# Patient Record
Sex: Female | Born: 1955
Health system: Southern US, Community
[De-identification: ages and names within clinical notes are randomized; demographics above are authoritative.]

## PROBLEM LIST (undated history)

## (undated) DIAGNOSIS — I2510 Atherosclerotic heart disease of native coronary artery without angina pectoris: Secondary | ICD-10-CM

## (undated) DIAGNOSIS — K219 Gastro-esophageal reflux disease without esophagitis: Secondary | ICD-10-CM

## (undated) DIAGNOSIS — K922 Gastrointestinal hemorrhage, unspecified: Secondary | ICD-10-CM

## (undated) DIAGNOSIS — K559 Vascular disorder of intestine, unspecified: Secondary | ICD-10-CM

## (undated) DIAGNOSIS — K2970 Gastritis, unspecified, without bleeding: Secondary | ICD-10-CM

## (undated) DIAGNOSIS — F419 Anxiety disorder, unspecified: Secondary | ICD-10-CM

## (undated) DIAGNOSIS — K222 Esophageal obstruction: Secondary | ICD-10-CM

## (undated) DIAGNOSIS — K589 Irritable bowel syndrome without diarrhea: Secondary | ICD-10-CM

## (undated) DIAGNOSIS — I10 Essential (primary) hypertension: Secondary | ICD-10-CM

## (undated) DIAGNOSIS — K635 Polyp of colon: Secondary | ICD-10-CM

## (undated) DIAGNOSIS — R0600 Dyspnea, unspecified: Secondary | ICD-10-CM

## (undated) DIAGNOSIS — Z8719 Personal history of other diseases of the digestive system: Secondary | ICD-10-CM

## (undated) DIAGNOSIS — I219 Acute myocardial infarction, unspecified: Secondary | ICD-10-CM

## (undated) DIAGNOSIS — R0609 Other forms of dyspnea: Secondary | ICD-10-CM

## (undated) DIAGNOSIS — E039 Hypothyroidism, unspecified: Secondary | ICD-10-CM

## (undated) DIAGNOSIS — D128 Benign neoplasm of rectum: Secondary | ICD-10-CM

## (undated) DIAGNOSIS — I208 Other forms of angina pectoris: Secondary | ICD-10-CM

## (undated) DIAGNOSIS — M109 Gout, unspecified: Secondary | ICD-10-CM

## (undated) HISTORY — DX: Esophageal obstruction: K22.2

## (undated) HISTORY — DX: Polyp of colon: K63.5

## (undated) HISTORY — DX: Other forms of dyspnea: R06.09

## (undated) HISTORY — DX: Essential (primary) hypertension: I10

## (undated) HISTORY — DX: Vascular disorder of intestine, unspecified: K55.9

## (undated) HISTORY — PX: HAMMER TOE SURGERY: SHX385

## (undated) HISTORY — DX: Irritable bowel syndrome, unspecified: K58.9

## (undated) HISTORY — PX: TONSILLECTOMY AND ADENOIDECTOMY: SUR1326

## (undated) HISTORY — DX: Dyspnea, unspecified: R06.00

## (undated) HISTORY — DX: Benign neoplasm of rectum: D12.8

## (undated) HISTORY — DX: Gastro-esophageal reflux disease without esophagitis: K21.9

## (undated) HISTORY — DX: Other forms of angina pectoris: I20.8

## (undated) HISTORY — DX: Gastritis, unspecified, without bleeding: K29.70

---

## 1983-08-03 HISTORY — PX: TUBAL LIGATION: SHX77

## 1997-08-02 HISTORY — PX: ABDOMINAL HYSTERECTOMY: SHX81

## 1997-09-12 ENCOUNTER — Ambulatory Visit (HOSPITAL_COMMUNITY): Admission: RE | Admit: 1997-09-12 | Discharge: 1997-09-12 | Payer: Self-pay | Admitting: Obstetrics & Gynecology

## 1997-10-30 ENCOUNTER — Observation Stay (HOSPITAL_COMMUNITY): Admission: RE | Admit: 1997-10-30 | Discharge: 1997-10-31 | Payer: Self-pay | Admitting: Obstetrics & Gynecology

## 1999-03-04 ENCOUNTER — Other Ambulatory Visit: Admission: RE | Admit: 1999-03-04 | Discharge: 1999-03-04 | Payer: Self-pay | Admitting: Obstetrics & Gynecology

## 2001-02-06 ENCOUNTER — Other Ambulatory Visit: Admission: RE | Admit: 2001-02-06 | Discharge: 2001-02-06 | Payer: Self-pay | Admitting: Obstetrics & Gynecology

## 2004-05-13 ENCOUNTER — Other Ambulatory Visit: Admission: RE | Admit: 2004-05-13 | Discharge: 2004-05-13 | Payer: Self-pay | Admitting: Obstetrics & Gynecology

## 2004-07-10 ENCOUNTER — Ambulatory Visit (HOSPITAL_COMMUNITY): Admission: RE | Admit: 2004-07-10 | Discharge: 2004-07-10 | Payer: Self-pay | Admitting: Obstetrics & Gynecology

## 2004-11-19 ENCOUNTER — Ambulatory Visit: Payer: Self-pay | Admitting: Internal Medicine

## 2004-12-30 ENCOUNTER — Ambulatory Visit: Payer: Self-pay | Admitting: Internal Medicine

## 2005-02-25 ENCOUNTER — Ambulatory Visit: Payer: Self-pay | Admitting: Internal Medicine

## 2005-08-31 ENCOUNTER — Ambulatory Visit: Payer: Self-pay | Admitting: Internal Medicine

## 2005-09-01 ENCOUNTER — Encounter (INDEPENDENT_AMBULATORY_CARE_PROVIDER_SITE_OTHER): Payer: Self-pay | Admitting: Specialist

## 2005-09-01 ENCOUNTER — Ambulatory Visit: Payer: Self-pay | Admitting: Internal Medicine

## 2005-09-06 ENCOUNTER — Ambulatory Visit: Payer: Self-pay | Admitting: Internal Medicine

## 2005-09-29 ENCOUNTER — Ambulatory Visit: Payer: Self-pay | Admitting: Internal Medicine

## 2006-08-02 DIAGNOSIS — D128 Benign neoplasm of rectum: Secondary | ICD-10-CM

## 2006-08-02 HISTORY — DX: Benign neoplasm of rectum: D12.8

## 2006-09-21 ENCOUNTER — Ambulatory Visit: Payer: Self-pay | Admitting: Internal Medicine

## 2006-09-22 ENCOUNTER — Ambulatory Visit: Payer: Self-pay | Admitting: Cardiology

## 2006-10-19 ENCOUNTER — Encounter (INDEPENDENT_AMBULATORY_CARE_PROVIDER_SITE_OTHER): Payer: Self-pay | Admitting: *Deleted

## 2006-10-19 ENCOUNTER — Ambulatory Visit: Payer: Self-pay | Admitting: Internal Medicine

## 2007-04-27 ENCOUNTER — Encounter: Admission: RE | Admit: 2007-04-27 | Discharge: 2007-04-27 | Payer: Self-pay | Admitting: Obstetrics & Gynecology

## 2007-11-06 ENCOUNTER — Encounter: Admission: RE | Admit: 2007-11-06 | Discharge: 2007-11-06 | Payer: Self-pay | Admitting: Obstetrics & Gynecology

## 2008-05-31 ENCOUNTER — Encounter: Admission: RE | Admit: 2008-05-31 | Discharge: 2008-05-31 | Payer: Self-pay | Admitting: Obstetrics & Gynecology

## 2008-07-17 ENCOUNTER — Encounter: Admission: RE | Admit: 2008-07-17 | Discharge: 2008-07-17 | Payer: Self-pay | Admitting: Specialist

## 2008-08-02 HISTORY — PX: ANTERIOR LUMBAR FUSION: SHX1170

## 2008-08-15 ENCOUNTER — Ambulatory Visit: Payer: Self-pay | Admitting: *Deleted

## 2008-09-30 HISTORY — PX: BACK SURGERY: SHX140

## 2008-10-02 ENCOUNTER — Encounter (INDEPENDENT_AMBULATORY_CARE_PROVIDER_SITE_OTHER): Payer: Self-pay | Admitting: Specialist

## 2008-10-02 ENCOUNTER — Ambulatory Visit: Payer: Self-pay | Admitting: *Deleted

## 2008-10-02 ENCOUNTER — Inpatient Hospital Stay (HOSPITAL_COMMUNITY): Admission: RE | Admit: 2008-10-02 | Discharge: 2008-10-06 | Payer: Self-pay | Admitting: Specialist

## 2008-10-03 ENCOUNTER — Ambulatory Visit: Payer: Self-pay | Admitting: Surgery

## 2008-10-03 ENCOUNTER — Encounter (INDEPENDENT_AMBULATORY_CARE_PROVIDER_SITE_OTHER): Payer: Self-pay | Admitting: Specialist

## 2008-10-03 HISTORY — PX: OTHER SURGICAL HISTORY: SHX169

## 2009-02-11 ENCOUNTER — Ambulatory Visit: Payer: Self-pay | Admitting: Internal Medicine

## 2009-02-11 DIAGNOSIS — Z86010 Personal history of colonic polyps: Secondary | ICD-10-CM | POA: Insufficient documentation

## 2009-02-11 DIAGNOSIS — K589 Irritable bowel syndrome without diarrhea: Secondary | ICD-10-CM | POA: Insufficient documentation

## 2009-02-11 DIAGNOSIS — K219 Gastro-esophageal reflux disease without esophagitis: Secondary | ICD-10-CM | POA: Insufficient documentation

## 2009-02-11 DIAGNOSIS — R1084 Generalized abdominal pain: Secondary | ICD-10-CM | POA: Insufficient documentation

## 2009-10-23 ENCOUNTER — Encounter: Admission: RE | Admit: 2009-10-23 | Discharge: 2009-10-23 | Payer: Self-pay | Admitting: Obstetrics & Gynecology

## 2009-11-03 ENCOUNTER — Telehealth: Payer: Self-pay | Admitting: Internal Medicine

## 2009-11-04 ENCOUNTER — Ambulatory Visit: Payer: Self-pay | Admitting: Internal Medicine

## 2009-11-04 DIAGNOSIS — R197 Diarrhea, unspecified: Secondary | ICD-10-CM | POA: Insufficient documentation

## 2009-11-04 DIAGNOSIS — K625 Hemorrhage of anus and rectum: Secondary | ICD-10-CM | POA: Insufficient documentation

## 2009-11-04 LAB — CONVERTED CEMR LAB
BUN: 15 mg/dL (ref 6–23)
Basophils Absolute: 0 10*3/uL (ref 0.0–0.1)
CO2: 29 meq/L (ref 19–32)
Creatinine, Ser: 0.9 mg/dL (ref 0.4–1.2)
Eosinophils Absolute: 0.1 10*3/uL (ref 0.0–0.7)
GFR calc non Af Amer: 69.45 mL/min (ref >60)
Glucose, Bld: 93 mg/dL (ref 70–99)
Lymphs Abs: 1.9 10*3/uL (ref 0.7–4.0)
Monocytes Absolute: 0.5 10*3/uL (ref 0.1–1.0)
Monocytes Relative: 9.2 % (ref 3.0–12.0)
Neutro Abs: 3 10*3/uL (ref 1.4–7.7)
Platelets: 278 10*3/uL (ref 150.0–400.0)
Potassium: 4.2 meq/L (ref 3.5–5.1)
RBC: 4.18 M/uL (ref 3.87–5.11)
RDW: 13.4 % (ref 11.5–14.6)
Sodium: 138 meq/L (ref 135–145)

## 2010-07-01 ENCOUNTER — Ambulatory Visit: Payer: Self-pay | Admitting: Internal Medicine

## 2010-07-01 DIAGNOSIS — R198 Other specified symptoms and signs involving the digestive system and abdomen: Secondary | ICD-10-CM | POA: Insufficient documentation

## 2010-07-01 DIAGNOSIS — R1310 Dysphagia, unspecified: Secondary | ICD-10-CM | POA: Insufficient documentation

## 2010-07-13 ENCOUNTER — Telehealth: Payer: Self-pay | Admitting: Internal Medicine

## 2010-08-02 HISTORY — PX: ESOPHAGEAL DILATION: SHX303

## 2010-08-06 ENCOUNTER — Encounter: Payer: Self-pay | Admitting: Internal Medicine

## 2010-08-06 ENCOUNTER — Ambulatory Visit
Admission: RE | Admit: 2010-08-06 | Discharge: 2010-08-06 | Payer: Self-pay | Source: Home / Self Care | Attending: Internal Medicine | Admitting: Internal Medicine

## 2010-09-03 NOTE — Progress Notes (Signed)
Summary: Prep Questions   Phone Note Call from Patient Call back at Home Phone [phone]   Caller: Patient Call For: Dr. Marina Goodell Reason for Call: Talk to Nurse Summary of Call: Pt has questions about her prep Initial call taken by: [place] Johnson,  July 13, 2010 8:49 AM  Follow-up for Phone Call        called patient back and l/m on voicemail to return my call.  Milford Cage [facility]  July 13, 2010 2:07 PM Pt. called and left message on voicemail stating prep was 40.00 and wondered if there was something cheaper.  I advised that Dr. Marina Goodell wants to use movi prep and it usually cost 80.00.  I told her I would mail to her a 20.00 rebate coupon.    Follow-up by: Milford Cage [place],  July 13, 2010 4:46 PM

## 2010-09-03 NOTE — Procedures (Signed)
Summary: Upper Endoscopy  Patient: [name] Note: All result statuses are Final unless otherwise noted.  Tests: (1) Upper Endoscopy (EGD)   EGD Upper Endoscopy       DONE     [facility]     [address]     [place], [place]  [address]           ENDOSCOPY PROCEDURE REPORT           PATIENT:  [name], [name]  MR#:  [id number]     BIRTHDATE:  Feb 20, 1956, 54 yrs. old  GENDER:  female           ENDOSCOPIST:  Wilhemina Bonito. Eda Keys, MD     Referred by:  Office           PROCEDURE DATE:  08/06/2010     PROCEDURE:  EGD, diagnostic 43235,     Maloney Dilation of Esophagus- 88F     ASA CLASS:  Class II     INDICATIONS:  dysphagia, dyspepsia           MEDICATIONS:   There was residual sedation effect present from     prior procedure.     TOPICAL ANESTHETIC:  Exactacain Spray           DESCRIPTION OF PROCEDURE:   After the risks benefits and     alternatives of the procedure were thoroughly explained, informed     consent was obtained.  The LB GIF-H180 [id number] endoscope was     introduced through the mouth and advanced to the second portion of     the duodenum, without limitations.  The instrument was slowly     withdrawn as the mucosa was fully examined.     <<PROCEDUREIMAGES>>           A large caliber ring-like stricture was found in the distal     esophagus.  Otherwise normal esophagus.  The stomach was entered     and closely examined. The antrum, angularis, and lesser curvature     were well visualized, including a retroflexed view of the cardia     and fundus. The stomach wall was normally distensable. The scope     passed easily through the pylorus into the duodenum.  The duodenal     bulb was normal in appearance, as was the postbulbar duodenum.     Retroflexed views revealed no abnormalities.    The scope was then     withdrawn from the patient and the procedure completed.           THERAPY: MALONEY DILATION 88F W/O HEME OR RESISTANCE. TOLERATED  WELL.           COMPLICATIONS:  None           ENDOSCOPIC IMPRESSION:     1) Stricture in the distal esophagus - S/P DILATION 88F     2) Otherwise normal esophagus     3) Normal stomach     4) Normal duodenum     5) GERD           RECOMMENDATIONS:     1) Clear liquids until 4 PM, then soft foods rest of day. Resume     prior diet tomorrow.     2) Continue NEXIUM on demand           ______________________________     Wilhemina Bonito. Eda Keys, MD           CC:  The Patient  n.     eSIGNED:   Wilhemina Bonito. Eda Keys at 08/06/2010 02:12 PM           Nicanor Bake, [id number]  Note: An exclamation mark (!) indicates a result that was not dispersed into the flowsheet. Document Creation Date: 08/06/2010 2:13 PM _______________________________________________________________________  (1) Order result status: Final Collection or observation date-time: 08/06/2010 14:05 Requested date-time:  Receipt date-time:  Reported date-time:  Referring Physician:   Ordering Physician: Fransico Setters [phone]) Specimen Source:  Source: Launa Grill Order Number: [id number] Lab site:

## 2010-09-03 NOTE — Assessment & Plan Note (Signed)
Summary: mucus stools, dysphagia...as.    History of Present Illness Visit Type: Follow-up Visit Primary GI MD: Yancey Flemings MD Primary Provider: na Requesting Provider: na Chief Complaint: Dysphagia with solids and liquids, mucus in stool  and constipation  History of Present Illness:   55 year old female with hypertension, GERD, adenomatous colon polyps, and irritable bowel syndrome. She presents today regarding change in bowel habits as manifested by worsening constipation and the passage of mucus per rectum. Also complains of a globus type sensation and some difficulty with swallowing. She was last seen in the office November 04, 2009 after having had an episode of abdominal pain associated with diaphoresis followed by diarrhea and rectal bleeding. She was felt to have other infectious colitis or ischemic colitis. Laboratories at that time were unremarkable including CBC with hemoglobin of 13.1 and basic metabolic panel. The C-reactive protein was elevated at 9. She was to followup as needed. Current complaints are that of passage of mucus daily for about 10 days 6 weeks ago. This without a bowel movement. She has used MiraLax for constipation but complains of intermittent incontinence when she does move her bowels. She has had no recurrent bleeding. No weight loss. No abdominal pain. She is on Nexium for GERD. She is known to have a peptic stricture on endoscopy performed in January of 2007. No dilation. Complaints now are more consistent with a globus type sensation that is present without eating mostly, as opposed to true esophageal dysphasia. However, as mentioned, she does mention some difficulties with meals. She denies classic reflux symptoms such as heartburn. She tells me that she does not have a recognized primary provider at this point. She had been seeing Dr. Alwyn Ren. Her last colonoscopy was in March 2008. Polyp removed at that time was adenomatous. She does have a history of colon cancer in the  family. Her brother died from the disease.   GI Review of Systems    Reports dysphagia with solids.      Denies abdominal pain, acid reflux, belching, bloating, chest pain, dysphagia with liquids, heartburn, loss of appetite, nausea, vomiting, vomiting blood, weight loss, and  weight gain.      Reports change in bowel habits and  constipation.     Denies anal fissure, black tarry stools, diarrhea, diverticulosis, fecal incontinence, heme positive stool, hemorrhoids, irritable bowel syndrome, jaundice, light color stool, liver problems, rectal bleeding, and  rectal pain.    Current Medications (verified): 1)  Nexium 40 Mg  Cpdr (Esomeprazole Magnesium) .Marland Kitchen.. 1 Capsule By Mouth Once Daily 2)  Lisinopril 40 Mg Tabs (Lisinopril) .... One By Mouth Two Times A Day 3)  Alprazolam 0.5 Mg Tabs (Alprazolam) .... As Needed 4)  Aspirin 325 Mg Tabs (Aspirin) .... Take 1 Tablet By Mouth Once A Day 5)  Bentyl 10 Mg Caps (Dicyclomine Hcl) .... Take 1 Tab Twice Daily As Needed For Cramping and Abdominal Discomfort.  Allergies (verified): No Known Drug Allergies  Past History:  Past Medical History: Esophageal Stricture Duodenitis Gastritis Tubular Adenoma colon polyps 2008 Hypertension GERD IBS  Family Hx of Colon Cancer   Past Surgical History: Reviewed history from 02/11/2009 and no changes required. Hysterectomy Back Surgery 3/ 2010  Family History: Reviewed history from 02/11/2009 and no changes required. Family History of Colon Cancer: Brother-dx'd in 6's, deceased at 30yo Family History of Kidney Disease: Mother, deceased Father deceased--Stroke  Social History: Reviewed history from 02/11/2009 and no changes required. 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  Alcohol Use - no Daily Caffeine Use tea Illicit Drug Use - no  Review of Systems       The patient complains of allergy/sinus and fatigue.  The patient denies anemia,  anxiety-new, arthritis/joint pain, back pain, blood in urine, breast changes/lumps, change in vision, confusion, cough, coughing up blood, depression-new, fainting, fever, headaches-new, hearing problems, heart murmur, heart rhythm changes, itching, menstrual pain, muscle pains/cramps, night sweats, nosebleeds, pregnancy symptoms, shortness of breath, skin rash, sleeping problems, sore throat, swelling of feet/legs, swollen lymph glands, thirst - excessive , urination - excessive , urination changes/pain, urine leakage, vision changes, and voice change.    Vital Signs:  Patient profile:   55 year old female Height:      63 inches Weight:      178 pounds BMI:     31.65 BSA:     1.84 Pulse rate:   76 / minute Pulse rhythm:   regular BP sitting:   148 / 82  (left arm) Cuff size:   regular  Vitals Entered By: Ok Anis CMA (July 01, 2010 8:16 AM)   Physical Exam  General:  Well developed, well nourished, no acute distress. Head:  Normocephalic and atraumatic. Eyes:  PERRLA, no icterus. Mouth:  No deformity or lesions Neck:  Supple; no masses or thyromegaly. Lungs:  Clear throughout to auscultation. Heart:  Regular rate and rhythm; no murmurs, rubs,  or bruits. Abdomen:  Soft, obese,nontender and nondistended. No masses, hepatosplenomegaly or hernias noted. Normal bowel sounds. Rectal:  deferred until colonoscopy Msk:  Symmetrical with no gross deformities. Normal posture. Pulses:  Normal pulses noted. Extremities:  no edema Neurologic:  alert and oriented Skin:  Intact without significant lesions or rashes. Psych:  Alert and cooperative. Normal mood and affect.   Impression & Recommendations:  Problem # 1:  CHANGE IN BOWELS (ICD-787.99) the patient presents today with a change in bowel habits as manifested by worsening constipation. As well, passed up mucus per rectum. Earlier this year with abdominal pain and rectal bleeding. Prior history of adenomatous colon polyps about 4  years ago. Family history of colon cancer in her brother. Suspect constipation predominant irritable bowel. Rule out more ominous diagnoses.  Plan #1. Colonoscopy. The nature of the procedure as well as the risks, benefits, and alternatives were reviewed. She understood and agreed to proceed #2. Movi prep prescribed. The patient instructed on its use  Problem # 2:  FAMILY HX COLON CANCER (ICD-V16.0) brother  Problem # 3:  PERSONAL HX COLONIC POLYPS (ICD-V12.72) adenoma March 2008  Problem # 4:  DYSPHAGIA UNSPECIFIED (ICD-787.20) sounds more like globus sensation. Stricture noted on endoscopy in 2007. need to investigate further with endoscopy and possible esophageal dilation.  plan: #1. Upper endoscopy with possible esophageal dilation. The nature of the procedure as well as the risks, benefits, and alternatives were reviewed. She understood and agreed to proceed  Problem # 5:  GERD (ICD-530.81)  Plan: #1. Continue PPI #2. Reflux precautions with attention to weight loss  Problem # 6:  health maintenance specifically told to secure primary care physician for her overall general medical care  Other Orders: Colon/Endo (Colon/Endo)  Patient Instructions: 1)  Colon/Endo [facility] 08/06/10 1:30 pm arrive at 12:30 pm 2)  Movi prep instructions given. 3)  Movi prep Rx. sent to your pharmacy. 4)  Colonoscopy and Flexible Sigmoidoscopy brochure given.  5)  Upper Endoscopy brochure given.  6)  The medication list was reviewed and reconciled.  All changed /  newly prescribed medications were explained.  A complete medication list was provided to the patient / caregiver. Prescriptions: MOVIPREP 100 GM  SOLR (PEG-KCL-NACL-NASULF-NA ASC-C) As per prep instructions.  #1 x 0   Entered by:   Milford Cage NCMA   Authorized by:   Hilarie Fredrickson MD   Signed by:   Milford Cage NCMA on 07/01/2010   Method used:   Electronically to        [id number] (retail)       [address] [place], [place]  [address]       Ph: [phone]       Fax: [phone]   RxID:   [id number]

## 2010-09-03 NOTE — Assessment & Plan Note (Signed)
Summary: rectal bleeding    History of Present Illness Visit Type: Follow-up Visit Primary GI MD: Yancey Flemings MD Primary Provider: n/a Requesting Provider: self Chief Complaint: rectal bleeding History of Present Illness:   55 YO FEMALE,KNOWN TO DR. PERRY WITH HX OF COLON POLYPS. COLONOSCOPY 3/08 WITH 2 POLYPS/ADENOMATOUS. SHE COMES IN TODAY AFTER ONSET 4 DAYS AGO WITH ABDOMINAL PAIN,CRAMPING AND DIAPHORESIS FOLLOWED BY NAUSEA,DIARRHEA AND EVENTUAL BLOOD IN STOOLS.THIS OCCURRED AFTER EATING CHINESE FOOD. HER FRIEND ATE THE SAME THING-HAD DIARRHEA BUT MUCH LESS SEVERE SXS.HER SXS AT THIS TIME ARE MUCH IMPROVED,STILL SORE IN UPPER ABDOMEN,BUT DIARRHEA HAS STOPPED-NO STOOL YESTERDAY AFTER IMMODIUM,AND NO FURTHER BLEEDING.. NO FEVER,CHILLS, AND HAS BEEN ABLE TO EAT.    GI Review of Systems    Reports abdominal pain and  loss of appetite.     Location of  Abdominal pain: lower abdomen.    Denies acid reflux, belching, bloating, chest pain, dysphagia with liquids, dysphagia with solids, heartburn, nausea, vomiting, vomiting blood, and  weight loss.      Reports change in bowel habits, diarrhea, and  rectal bleeding.     Denies anal fissure, black tarry stools, diverticulosis, fecal incontinence, heme positive stool, hemorrhoids, irritable bowel syndrome, jaundice, light color stool, liver problems, and  rectal pain.    Current Medications (verified): 1)  Nexium 40 Mg  Cpdr (Esomeprazole Magnesium) .Marland Kitchen.. 1 Capsule By Mouth Once Daily 2)  Toprol Xl 100 Mg Xr24h-Tab (Metoprolol Succinate) .Marland Kitchen.. 1 Tablet By Mouth Once Daily 3)  Benicar Hct 40-12.5 Mg Tabs (Olmesartan Medoxomil-Hctz) .... Take 1 Tablet By Mouth Once A Day 4)  Alprazolam 0.5 Mg Tabs (Alprazolam) .... As Needed 5)  Aspirin 325 Mg Tabs (Aspirin) .... Take 1 Tablet By Mouth Once A Day  Allergies (verified): No Known Drug Allergies  Past History:  Past Medical History: Esophageal Stricture Duodenitis Gastritis Tubular Adenoma  colon polyps 2008 Hypertension  Past Surgical History: Reviewed history from 02/11/2009 and no changes required. Hysterectomy Back Surgery 3/ 2010  Family History: Reviewed history from 02/11/2009 and no changes required. Family History of Colon Cancer: Brother-dx'd in 70's, deceased at 27yo Family History of Kidney Disease: Mother, deceased Father deceased--Stroke  Social History: Reviewed history from 02/11/2009 and no changes required. 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  Alcohol Use - no Daily Caffeine Use tea Illicit Drug Use - no  Review of Systems  The patient denies allergy/sinus, anemia, anxiety-new, arthritis/joint pain, back pain, blood in urine, breast changes/lumps, change in vision, confusion, cough, coughing up blood, depression-new, fainting, fatigue, fever, headaches-new, hearing problems, heart murmur, heart rhythm changes, itching, menstrual pain, muscle pains/cramps, night sweats, nosebleeds, pregnancy symptoms, shortness of breath, skin rash, sleeping problems, sore throat, swelling of feet/legs, swollen lymph glands, thirst - excessive , urination - excessive , urination changes/pain, urine leakage, vision changes, and voice change.         ROS OTHERWISE AS IN HPI  Vital Signs:  Patient profile:   55 year old female Height:      63 inches Weight:      189 pounds BMI:     33.60 Pulse rate:   70 / minute Pulse rhythm:   regular BP sitting:   160 / 80  (left arm)  Vitals Entered By: Chales Abrahams CMA Duncan Dull) (November 04, 2009 8:35 AM)  Physical Exam  General:  Well developed, well nourished, no acute distress. Head:  Normocephalic and atraumatic. Eyes:  PERRLA, no icterus. Lungs:  Clear throughout to auscultation. Heart:  Regular rate and rhythm; no murmurs, rubs,  or bruits. Abdomen:  SOFT, BASICALLY NONTENDER, NO MASS OR HSM,BS+ Rectal:  SCANT BLOOD ON GLOVE Extremities:  No clubbing, cyanosis, edema or  deformities noted. Neurologic:  Alert and  oriented x4;  grossly normal neurologically. Psych:  Alert and cooperative. Normal mood and affect.anxious.     Impression & Recommendations:  Problem # 1:  DIARRHEA OF PRESUMED INFECTIOUS ORIGIN (ICD-009.3) Assessment New 55 YO WITH ACUTE ILLNESS X 4-5 DAYS WITH GENERALIZED ABDOMINAL PAIN,DIARRHEA FOLLOWED BY BLOODY DIARRHEA-SXS RESOLVING.  R/O ACUTE INFECTIOUS DIARRHEAL ILLNESS-FOOD BORNE,VS SEGMENTAL ISCHEMIC COLITIS.  DISCUSSED NATURE OF BOTH PROCESSES,REASSURED LABS TODAY,STOOL CULTURES IF ANY FURTHER DIARRHEA BENTYL 10 MG TWICE DAILY AS NEEDED FOR PAIN,CRAMPING OVER THE NEXT FEW DAYS. RESUME NEXIUM 40 MG DAILY X 2 WEEKS THEN MAY GO BACK TO AS NEEDED. Orders: TLB-BMP (Basic Metabolic Panel-BMET) (80048-METABOL) TLB-CRP-High Sensitivity (C-Reactive Protein) (86140-FCRP) TLB-CBC Platelet - w/Differential (85025-CBCD)  Problem # 2:  PERSONAL HX COLONIC POLYPS (ICD-V12.72) Assessment: Comment Only FOLLOW UP DUE 2013  Problem # 3:  HYPERTENSION (ICD-401.9) Assessment: Comment Only  Patient Instructions: 1)  Your physician has requested that you have the following labwork done today: Please go to basement level lab. 2)  Take Nexium 40 mg twice daily for the next 14 days  .  3)  We sent a perscription for you to [facility] for Bentyl. Take as directed. 4)  Follow up as needed with Dr. Yancey Flemings. 5)  The medication list was reviewed and reconciled.  All changed / newly prescribed medications were explained.  A complete medication list was provided to the patient / caregiver. Prescriptions: BENTYL 10 MG CAPS (DICYCLOMINE HCL) Take 1 tab twice daily as needed for cramping and abdominal discomfort.  #60 x 1   Entered by:   Lowry Ram NCMA   Authorized by:   Sammuel Cooper [name]   Signed by:   Lowry Ram NCMA on 11/04/2009   Method used:   Electronically to        [facility] (retail)       [address] [place], [place]  [address]       Ph: [phone]       Fax: [phone]   RxID:   [id number]

## 2010-09-03 NOTE — Letter (Signed)
Summary: [facility] Instructions  [facility]  [address] [place], [place] [address]   Phone: [phone]  Fax: [phone]       [name]    [birth year]    MRN: [id number]        Procedure Day /Date:THURSDAY, 08/06/10     Arrival Time:12:30 PM     Procedure Time:1:30 PM     Location of Procedure:                    X  [facility] (4th Floor)       PREPARATION FOR COLONOSCOPY WITH MOVIPREP/ENDO   Starting 5 days prior to your procedure 08/01/10 do not eat nuts, seeds, popcorn, corn, beans, peas,  salads, or any raw vegetables.  Do not take any fiber supplements (e.g. Metamucil, Citrucel, and Benefiber).  THE DAY BEFORE YOUR PROCEDURE         DATE:08/05/10 DAY: WEDNESDAY  1.  Drink clear liquids the entire day-NO SOLID FOOD  2.  Do not drink anything colored red or purple.  Avoid juices with pulp.  No orange juice.  3.  Drink at least 64 oz. (8 glasses) of fluid/clear liquids during the day to prevent dehydration and help the prep work efficiently.  CLEAR LIQUIDS INCLUDE: Water Jello Ice Popsicles Tea (sugar ok, no milk/cream) Powdered fruit flavored drinks Coffee (sugar ok, no milk/cream) Gatorade Juice: apple, white grape, white cranberry  Lemonade Clear bullion, consomm, broth Carbonated beverages (any kind) Strained chicken noodle soup Hard Candy                             4.  In the morning, mix first dose of MoviPrep solution:    Empty 1 Pouch A and 1 Pouch B into the disposable container    Add lukewarm drinking water to the top line of the container. Mix to dissolve    Refrigerate (mixed solution should be used within 24 hrs)  5.  Begin drinking the prep at 5:00 p.m. The MoviPrep container is divided by 4 marks.   Every 15 minutes drink the solution down to the next mark (approximately 8 oz) until the full liter is complete.   6.  Follow completed prep with 16 oz of clear liquid of your choice (Nothing red or  purple).  Continue to drink clear liquids until bedtime.  7.  Before going to bed, mix second dose of MoviPrep solution:    Empty 1 Pouch A and 1 Pouch B into the disposable container    Add lukewarm drinking water to the top line of the container. Mix to dissolve    Refrigerate  THE DAY OF YOUR PROCEDURE      DATE:08/06/10 SWF:UXNATFTD  Beginning at 8:30 a.m. (5 hours before procedure):         1. Every 15 minutes, drink the solution down to the next mark (approx 8 oz) until the full liter is complete.  2. Follow completed prep with 16 oz. of clear liquid of your choice.    3. You may drink clear liquids until 11:30 AM (2 HOURS BEFORE PROCEDURE).   MEDICATION INSTRUCTIONS  Unless otherwise instructed, you should take regular prescription medications with a small sip of water   as early as possible the morning of your procedure.         OTHER INSTRUCTIONS  You will need a responsible adult at least 55 years of age  to accompany you and drive you home.   This person must remain in the waiting room during your procedure.  Wear loose fitting clothing that is easily removed.  Leave jewelry and other valuables at home.  However, you may wish to bring a book to read or  an iPod/MP3 player to listen to music as you wait for your procedure to start.  Remove all body piercing jewelry and leave at home.  Total time from sign-in until discharge is approximately 2-3 hours.  You should go home directly after your procedure and rest.  You can resume normal activities the  day after your procedure.  The day of your procedure you should not:   Drive   Make legal decisions   Operate machinery   Drink alcohol   Return to work  You will receive specific instructions about eating, activities and medications before you leave.    The above instructions have been reviewed and explained to me by   _______________________    I fully understand and can verbalize these  instructions _____________________________ Date _________

## 2010-09-03 NOTE — Progress Notes (Signed)
Summary: triage   Phone Note Call from Patient Call back at Home Phone [phone]   Caller: Patient Call For: Marina Goodell Reason for Call: Talk to Nurse Summary of Call: Patient wants to be seen sooner than first available appt do to a lot of blood in her stools and a little pain. Initial call taken by: Tawni Levy,  November 03, 2009 8:06 AM  Follow-up for Phone Call        Friday and Sat. pt. had loose stools with BRB and mucus along with cramping and nausea. Says on Friday she felt weak and almost most passed out. No bm yesterday or today yet is still having some nausea.Appt. scheduled for tomorrow as she prefers to wait as Dr.Kasey Ewings is supervising dr.Says she has IBS but is afraid to use anything for constipation because then she can't get the diarrhea under control. Follow-up by: Teryl Lucy RN,  November 03, 2009 8:24 AM

## 2010-09-03 NOTE — Procedures (Addendum)
Summary: Colonoscopy  Patient: [name] Note: All result statuses are Final unless otherwise noted.  Tests: (1) Colonoscopy (COL)   COL Colonoscopy           DONE     [facility]     [address]     [place], [place]  [address]           COLONOSCOPY PROCEDURE REPORT           PATIENT:  [name], [name]  MR#:  [id number]     BIRTHDATE:  08/29/1955, 54 yrs. old  GENDER:  female     ENDOSCOPIST:  Wilhemina Bonito. Eda Keys, MD     REF. BY:  Office     PROCEDURE DATE:  08/06/2010     PROCEDURE:  Colonoscopy with snare polypectomy x 2     ASA CLASS:  Class II     INDICATIONS:  history of pre-cancerous (adenomatous) colon polyps,     family history of colon cancer, surveillance and high-risk     screening ; 10-2006 w/ TA; brother w/ CRC (Also c/o mucous,     constipation, and abdominal discomfort)     MEDICATIONS:   Fentanyl 150 mcg IV, Versed 15 mg IV, Benadryl 50     mg IV           DESCRIPTION OF PROCEDURE:   After the risks benefits and     alternatives of the procedure were thoroughly explained, informed     consent was obtained.  Digital rectal exam was performed and     revealed no abnormalities.   The LB 180AL [id number] endoscope was     introduced through the anus and advanced to the cecum, which was     identified by both the appendix and ileocecal valve, without     limitations.Time to cecum = 2:22 min. The quality of the prep was     excellent, using MoviPrep.  The instrument was then slowly     withdrawn (time = 12:15 min) as the colon was fully examined.     <<PROCEDUREIMAGES>>           FINDINGS:  A 1mm polyp was found in the cecum. Polyp was snared     without cautery.  A 1mm polyp was found in the ascending colon.     Polyp was snared without cautery. No meaningful tissue available     for submission.  Otherwise normal colonoscopy without other     polyps, masses, vascular ectasias, or inflammatory changes.     Retroflexed views in the rectum revealed no  abnormalities.    The     scope was then withdrawn from the patient and the procedure     completed.           COMPLICATIONS:  None     ENDOSCOPIC IMPRESSION:     1) Diminutive polyp in the cecum - removed     2) Diminutive polyp in the ascending colon - removed     3) Otherwise normal  colonoscopy           RECOMMENDATIONS:     1) Follow up colonoscopy in 5 years           ______________________________     Wilhemina Bonito. Eda Keys, MD           CC:  The Patient           n.     eSIGNED:   Wilhemina Bonito. Marina Goodell  Jr at 08/06/2010 02:01 PM           Nicanor Bake, [id number]  Note: An exclamation mark (!) indicates a result that was not dispersed into the flowsheet. Document Creation Date: 08/06/2010 2:03 PM _______________________________________________________________________  (1) Order result status: Final Collection or observation date-time: 08/06/2010 13:54 Requested date-time:  Receipt date-time:  Reported date-time:  Referring Physician:   Ordering Physician: Fransico Setters [phone]) Specimen Source:  Source: Launa Grill Order Number: [id number] Lab site:

## 2010-10-28 ENCOUNTER — Other Ambulatory Visit: Payer: Self-pay | Admitting: Dermatology

## 2010-11-12 LAB — CBC
Hemoglobin: 8.8 g/dL — ABNORMAL LOW (ref 12.0–15.0)
MCHC: 35.7 g/dL (ref 30.0–36.0)
MCV: 89.9 fL (ref 78.0–100.0)
MCV: 90.8 fL (ref 78.0–100.0)
MCV: 91 fL (ref 78.0–100.0)
Platelets: 180 10*3/uL (ref 150–400)
Platelets: 184 10*3/uL (ref 150–400)
RBC: 2.72 MIL/uL — ABNORMAL LOW (ref 3.87–5.11)
RDW: 13.1 % (ref 11.5–15.5)
WBC: 12.7 10*3/uL — ABNORMAL HIGH (ref 4.0–10.5)
WBC: 13.4 10*3/uL — ABNORMAL HIGH (ref 4.0–10.5)

## 2010-11-12 LAB — BASIC METABOLIC PANEL
BUN: 12 mg/dL (ref 6–23)
CO2: 26 mEq/L (ref 19–32)
Calcium: 8.2 mg/dL — ABNORMAL LOW (ref 8.4–10.5)
Chloride: 105 mEq/L (ref 96–112)
Chloride: 105 mEq/L (ref 96–112)
Creatinine, Ser: 0.78 mg/dL (ref 0.4–1.2)
Creatinine, Ser: 0.85 mg/dL (ref 0.4–1.2)
GFR calc Af Amer: >60 mL/min (ref >60)
Glucose, Bld: 154 mg/dL — ABNORMAL HIGH (ref 70–99)
Potassium: 3.7 mEq/L (ref 3.5–5.1)
Sodium: 140 mEq/L (ref 135–145)

## 2010-11-12 LAB — HEMOGLOBIN A1C: Mean Plasma Glucose: 114 mg/dL

## 2010-11-12 LAB — HEMOGLOBIN AND HEMATOCRIT, BLOOD: Hemoglobin: 8.9 g/dL — ABNORMAL LOW (ref 12.0–15.0)

## 2010-11-12 LAB — GLUCOSE, CAPILLARY

## 2010-11-17 LAB — COMPREHENSIVE METABOLIC PANEL
AST: 22 U/L (ref 0–37)
Albumin: 3.8 g/dL (ref 3.5–5.2)
Alkaline Phosphatase: 132 U/L — ABNORMAL HIGH (ref 39–117)
Chloride: 100 mEq/L (ref 96–112)
GFR calc Af Amer: >60 mL/min (ref >60)
Potassium: 3.7 mEq/L (ref 3.5–5.1)
Total Bilirubin: 0.3 mg/dL (ref 0.3–1.2)

## 2010-11-17 LAB — DIFFERENTIAL
Basophils Absolute: 0.1 10*3/uL (ref 0.0–0.1)
Basophils Relative: 1 % (ref 0–1)
Eosinophils Relative: 2 % (ref 0–5)
Monocytes Absolute: 0.6 10*3/uL (ref 0.1–1.0)

## 2010-11-17 LAB — CBC
HCT: 38.4 % (ref 36.0–46.0)
Platelets: 276 10*3/uL (ref 150–400)
WBC: 7.3 10*3/uL (ref 4.0–10.5)

## 2010-11-17 LAB — URINALYSIS, ROUTINE W REFLEX MICROSCOPIC
Bilirubin Urine: NEGATIVE
Glucose, UA: NEGATIVE mg/dL
Ketones, ur: NEGATIVE mg/dL
Leukocytes, UA: NEGATIVE
pH: 6 (ref 5.0–8.0)

## 2010-11-17 LAB — TYPE AND SCREEN
ABO/RH(D): O POS
Antibody Screen: NEGATIVE

## 2010-11-17 LAB — ABO/RH: ABO/RH(D): O POS

## 2010-12-15 NOTE — Op Note (Signed)
NAMEMARQUITA, [name] NO.:  [id number]   MEDICAL RECORD NO.:  [id number]          PATIENT TYPE:  INP   LOCATION:  5035                         FACILITY:  [facility]   PHYSICIAN:  Jene Every, M.D.    DATE OF BIRTH:  1955/10/28   DATE OF PROCEDURE:  10/02/2008  DATE OF DISCHARGE:                               OPERATIVE REPORT   PREOPERATIVE DIAGNOSES:  1. Degenerative disk disease.  2. Degenerative spondylolisthesis at L5-S1.   POSTOPERATIVE DIAGNOSES:  1. Degenerative disk disease.  2. Degenerative spondylolisthesis at L5-S1.   PROCEDURE PERFORMED:  1. Interlumbar interbody fusion utilizing SynFix interbody spacer and      plate and screw fixation.  2. Autologous bone marrow aspirate and allograft Actifuse bone graft.   ANESTHESIA:  General.   ASSISTANT:  1. Dr. Shon Baton.  2. Dr. Madilyn Fireman.   BRIEF HISTORY:  This is a 55 year old with severe degenerative  spondylolisthesis, grade 1 degenerative disk disease and facet  arthrosis, concordant diskography, discordant at L4-L5 facet arthrosis.  She had a fairly degenerated disk space with slight listhesis, been  refractory to conservative treatment, and  is indicated for fusion of  the degenerated segment.  Risks and benefits discussed bleeding,  infection, damage to vascular structure, inability to fuse, need for  revision, posterior approach, etc.   __________ the patient was placed in supine position, Andrews frame,  slight bump in the lower part of the lumbar region.  The abdomen was  prepped and draped in the usual sterile fashion.  After the surgical  approach by Dr. Madilyn Fireman which was a left midline abdominal incision after  localization under C-arm x-ray for L5-S1 interspace approach.  Dr. Madilyn Fireman  provided the approach at L5-S1 with appropriate vascular and soft tissue  retraction for this.  Spaces were identified on her x-ray on the AP and  lateral plain and centralized.  It was noted initially was a wider  space  at L5-S1 than seen on the preoperative radiographs and radiographs noted  in the office.  We did, therefore, confirm the space with multiple re-  evaluations.  Following this, in the L5 vertebral body, we used a  Jamshidi needle, impacted into the vertebral body 1 cm, and aspirated  approximately 6 mL of the bone marrow aspirate.  This was mixed with  Actifuse for the appropriate bone grafting.  I then placed bone wax in  this donor's site which was after irrigation.  Next, a 10-blade was  utilized to incise the anulus in the medially, laterally, cephalad, and  caudad.  We then used a Cobb to remove the cartilage from the endplates  at L5 and S1 and used a pituitary to perform a full diskectomy.  The  endplate was preserved, and we curetted the cartilage to get bleeding  tissue.  We then proceeded posteriorly and released the annulus from the  annular attachment 2.5 utilizing a curved curette.  Then, further  augmented that with 2-mm Kerrison over the posterior aspect of S1.  This  released posterior annulus.  Next, we then tried multiple trials, which  seemed to at least initially not contour  on the L5 due to the concavity  of the vertebral body at L5 which was somewhat unusual.  We tried  multiple sizes including the 13, 5, 15, as well as the 8 and 12-degree  lordotic cages.  We then entertained utilizing a stand-alone PEEK cage  with a screw anteriorly, given the unusual anatomy.  I felt that the  SynFix cage would be lowered laterally but not centrally.  We were  discussing options to increase the compressive surface that would be  loaded under compression.  We removed the bone from beneath the lower  lumbar spine which closed down the disk space and improved the  conformity.  At the end of this, decision making process elected to  proceed with the SynFix 15 12-degree lordotic cage.  This was then  packed with autologous and allograft bone graft inserted with the squid   inserted in the AP and lateral C-arm plain with appropriate insertion  with some space between the back of the device in the posterior aspect  of the vertebral body.  We then impacted bone graft above the cage  filling in the area between the top part of the cage and L5 that we  slightly gapped open.  The device fit in quite well.  It was fairly  snug, it was not loose.  We felt that this was the appropriate  confirming size.  This was then fixed with 4 screws cephalad and caudad  with the appropriate guide with utilization of the awl, insertion of 20-  mm screws with excellent purchase.  This was seen on the AP and lateral  plain to have closed down the gap, we were initially concerned about and  an appropriate spacing as well in the AP plain.  The wounds were then  copiously irrigated.  The AP and lateral plain without retractor showed  excellent placement of the device.  Next, inspection from the ulnar  retractors revealed no evidence of bleeding.  Full inspection of the  tissues was performed.  Next, following this, we closed the abdominal  fascia with 2-0 running #1 Vicryl from the outer edges converging  medially and then overlapping at the end, lifting up on the fascia, and  protecting it from the soft tissue beneath it.  Next, the wound was  copiously irrigated, and I closed the subcutaneous tissue which showed  fair amount of adipose tissue with 0 and 2-0 Vicryl subcuticular.  Skin  was reapproximated with 4-0 subcuticular Prolene, the wound reinforced  with Steri-Strips, sterile dressing applied applied, placed supine on  the hospital bed, and extubated without difficulty and transported to  recovery in satisfactory condition.   The patient tolerated the procedure well with no complications.  Dr.  Shon Baton and Dr. Madilyn Fireman performed the approach.  She had a pulse oximetry  on left lower extremity throughout the case and had continued with  normal pulses and oximetry throughout.    The patient tolerated the procedure well with no complications.  Blood  loss 350 mL was given.  We had a unit returned with Cell Saver.      Jene Every, M.D.  Electronically Signed     JB/MEDQ  D:  10/02/2008  T:  10/03/2008  Job:  [id number]

## 2010-12-15 NOTE — Op Note (Signed)
NAMEHANLEY, RISPOLI NO.:  [id number]   MEDICAL RECORD NO.:  [id number]          PATIENT TYPE:  INP   LOCATION:  5035                         FACILITY:  [facility]   PHYSICIAN:  Balinda Quails, M.D.    DATE OF BIRTH:  26-Jul-1956   DATE OF PROCEDURE:  10/02/2008  DATE OF DISCHARGE:                               OPERATIVE REPORT   SURGEON:  Balinda Quails, MD   CO-SURGEON:  Javier Docker, MD   ASSISTANT:  Alvy Beal, MD   ANESTHETIC:  General endotracheal.   PREOPERATIVE DIAGNOSIS:  L5-S1 degenerative disk disease.   POSTOPERATIVE DIAGNOSIS:  L5-S1 degenerative disk disease.   PROCEDURE:  L5-S1 anterior lumbar interbody fusion (ALIF).   CLINICAL NOTE:  [name] is a 55 year old female who was seen  in consultation preoperatively in the office for planned one-level L5-S1  ALIF.  The patient was seen and felt to be a good candidate for surgery.  Risks of surgery were explained preoperatively including the details of  the operative procedure.   OPERATIVE PROCEDURE:  The patient was brought to the operating room in  stable condition.  Foley catheter, arterial line, and Swan-Ganz catheter  in place.  General endotracheal anesthesia was induced in the supine  position.  The abdomen was prepped and draped in a sterile fashion.   A transverse skin incision was made in the left lower quadrant at the  projection of L5-S1.  Subcutaneous tissue was divided with  electrocautery.  Left anterior rectus sheath was incised transversely.  The anterior rectus sheath flaps were raised.  The left rectus muscle  was mobilized and retracted medially.  The left retroperitoneal plane  was entered and the peritoneal contents were rotated anteriorly.  The  ureter was rotated with the peritoneal contents.  The left common and  external iliac artery were identified.  The L5-S1 disk could easily be  felt.  Blunt dissection was carried directly down onto the L5-S1 disk.  Middle sacral vessels were controlled with clips and divided.  The L5-S1  disk was cleared from left-to-right with blunt dissection.  The veins  were retracted laterally.  A small rent was created in the left common  iliac vein.  This was easily repaired with interrupted 5-0 Prolene  without compromise of the lumen.   Full retraction was then completed with reverse lip Brau blades using  the [facility] retractor system.  The full exposure of the L5-S1 disk was  verified by fluoroscopy.   Dr. Shelle Iron with the assistance of Dr. Shon Baton then performed L5-S1 ALIF.  Closure dictated under separate heading by Dr. Shelle Iron.   In the PACU, the patient had 2+ dorsalis pedis pulses bilaterally  without apparent complication.      Balinda Quails, M.D.  Electronically Signed     PGH/MEDQ  D:  10/02/2008  T:  10/03/2008  Job:  [id number]

## 2010-12-15 NOTE — H&P (Signed)
[name], [name] NO.:  [id number]   MEDICAL RECORD NO.:  [id number]          PATIENT TYPE:  INP   LOCATION:                               FACILITY:  [facility]   PHYSICIAN:  [name], M.D.    DATE OF BIRTH:  08-Oct-1955   DATE OF ADMISSION:  10/02/2008  DATE OF DISCHARGE:                              HISTORY & PHYSICAL   ADMISSION DIAGNOSIS:  Degenerative joint disease at L5-S1.   HISTORY:  [name] is a 55 year old female with a history of low  back pain that has progressively gotten worse.  She describes it as  disabling and severe, especially with standing or walking for extended  periods of time.  She has undergone conservative treatment with only  short-term relief of her symptoms.  This includes facet injections as  well as physical therapy.  Again, she notes her pain pattern as severe  and disabling.  Diskogram studies were obtained, which do show  concordant pain at L5-S1 with severe disk degeneration.  It is felt that  she would benefit from an anterior lumbar fusion.  The risks and  benefits of this were discussed with the patient.  She does elect to  proceed.   Medical history is significant for:  1. Uncontrolled hypertension.  2. Gastroesophageal reflux disease.  3. Anxiety.  4. Hypercholesteremia.   CURRENT MEDICATIONS:  1. Toprol-XL 100 mg 1 p.o. daily.  2. Hydrochlorothiazide 25 mg 1 p.o. daily.  3. Nexium 40 mg p.o. p.r.n.  4. Xanax 0.5 mg 1 p.o. daily.  5. Premarin 0.45 mg 1 p.o. daily.  6. New medication started as of September 27, 2008, include Azor 10/40      mg 1 p.o. q.a.m.  7. Crestor 10 mg 1 p.o. daily.   ALLERGIES:  None.   PREVIOUS SURGERIES:  1. Hysterectomy.  2. Bilateral foot surgery.   SOCIAL HISTORY:  The patient is divorced.  She lives alone.  History is  negative for tobacco or alcohol.  She has 2 children.   CARDIOLOGIST:  Sabrina Batty, MD   REVIEW OF SYSTEMS:  GENERAL:  The patient denies any fever,  chills,  night sweats, or bleeding tendencies.  CNS:  No blurred, double vision,  seizure, headache, or paralysis.  RESPIRATORY:  No shortness of breath,  productive cough, or hemoptysis.  CARDIOVASCULAR:  No chest pain,  dyspnea, or orthopnea.  GENITOURINARY:  No dysuria, hematuria, or  discharge.  GASTROINTESTINAL:  No nausea, vomiting, diarrhea,  constipation, melena, or bloody stools.  MUSCULOSKELETAL:  Per HPI.   PHYSICAL EXAMINATION:  VITAL SIGNS:  Pulse is 80, respiratory 12, BP  170/110.  The patient confirms this is her on normal blood pressure.  She has had a issues in the past with control.  She has not seen Dr.  Allyson Day since 2008.  GENERAL:  This is an overweight female, sitting upright, in moderate  distress.  She does have pain with forward flexion/extension of lumbar  spine.  She does walk with slight antalgic gait.  HEENT:  Atraumatic, normocephalic.  Pupils equal, round, and reactive to  light.  EOMs intact.  NECK:  Supple and no lymphadenopathy.  CHEST:  Clear to auscultation bilaterally.  No rhonchi, wheezes, or  rales.  HEART:  Regular rate and rhythm without murmurs, gallops, or rubs.  ABDOMEN:  Protuberant, soft, nontender, nondistended.  Bowel sounds x4.  SKIN:  No rashes or lesions are noted.  BACK:  She has a pain with forward flexion and extension.  EXTREMITIES:  She has negative straight-leg raise bilaterally.  EHL is  equal.   IMPRESSION:  Disk degeneration at L5-1.   IMPRESSION:  The patient will be admitted to undergo anterior lumbar  fusion at 5-1.  We did obtain a cardiac clearance from [name].  The  patient was evaluated on September 27, 2008.  On September 30, 2008, she had  an echo, as well as a stress test that were reviewed, and they placed  the patient at a low cardiovascular risk.  We will proceed with the  stairlift as anticipated.  If there are any problems in the hospital, we  will call [facility] to evaluate her.       [name], P.A.      [name], M.D.  Electronically Signed    CS/MEDQ  D:  10/01/2008  T:  10/02/2008  Job:  [id number]

## 2010-12-15 NOTE — Consult Note (Signed)
VASCULAR SURGERY CONSULTATION   Day, Sabrina A  DOB:  Feb 08, 1956                                       08/15/2008  ZOXWR#:[id number]   REFERRAL DIAGNOSIS:  L5-S1 degenerative disk disease.   HISTORY:  The patient is a 55 year old female with a history of chronic  back pain.  She has severe pain in the lower back when she attempts to  stand in flexion as well as in extension.  She has undergone extensive  workup by Dr. Shelle Iron including discogram.  She had concordance at L5-S1,  severe degenerative disk disease with concordant pain.  She was referred  at this time for a preoperative evaluation prior to L5-S1 ALIF.   History of abdominal surgery with hysterectomy, apparently vaginal  hysterectomy with laparoscopic assistance.  No history of DVT or  pulmonary embolus.  Denies known peripheral vascular disease.   PAST MEDICAL HISTORY:  Hypertension.   SOCIAL HISTORY:  The patient is single.  She has 2 grown children.  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  Does not  smoke or use tobacco products.   FAMILY HISTORY:  Mother deceased age 74 of kidney failure.  Father  deceased age 109 of a stroke.  She has a brother who is deceased of an MI  and another brother killed in an accident.   MEDICATIONS:  1. Toprol 100 mg daily.  2. Hydrochlorothiazide 25 mg daily.  3. Xanax 0.5 mg daily.  4. Clonidine q.h.s.  5. Premarin 0.45 daily.  6. Nexium p.r.n.   ALLERGIES:  None known.   REVIEW OF SYSTEMS:  The patient denies any recent symptoms of chest pain  or shortness of breath.  No cough or sputum production.  Regular bowel  habits.  No abdominal pain.  No recent weight loss or anorexia.  Denies  dysuria, frequency.  No leg swelling.   PHYSICAL EXAMINATION:  General:  A moderately obese 55 year old female.  Alert and oriented.  No distress.  Vital signs:  BP 173/106, pulse 76  per minute.  HEENT:  Unremarkable.  Neck:  Supple.  No  thyromegaly or  adenopathy.  Cardiovascular:  No carotid bruits.  Normal heart sounds  without murmurs.  Irregular rate and rhythm.  No gallops or rubs.  Chest:  Equal entry bilaterally.  No rales or rhonchi.  Abdomen:  Moderate obesity.  No masses or organomegaly.  Nontender.  No abdominal  bruits.  Lower extremities:  2+ femoral, popliteal, posterior tibial and  dorsalis pedis pulses bilaterally.  No ankle edema.   IMPRESSION:  1. Chronic back pain associated with L5-S1 degenerative disk disease.  2. Hypertension.   The patient does have moderate obesity and history of previous  laparoscopic assisted vaginal hysterectomy.  These do not preclude  potential anterior lumbar interbody fusion with left retroperitoneal  approach.  Surgery can be scheduled as appropriate.  Potential risks of  the operative procedure including vascular injury, limb ischemia,  bleeding, DVT, pulmonary embolus, ureter injury, bowel injury, infection  or hernia have been reviewed.   Balinda Quails, M.D.  Electronically Signed  PGH/MEDQ  D:  08/15/2008  T:  08/16/2008  Job:  [id number]   cc:   Jene Every, M.D.

## 2010-12-18 NOTE — Op Note (Signed)
[name], [name] NO.:  [id number]   MEDICAL RECORD NO.:  [id number]          PATIENT TYPE:  AMB   LOCATION:  SDC                           FACILITY:  [facility]   PHYSICIAN:  Freddy Finner, M.D.   DATE OF BIRTH:  [birth year]   DATE OF PROCEDURE:  07/10/2004  DATE OF DISCHARGE:                                 OPERATIVE REPORT   PREOPERATIVE DIAGNOSIS:  Pelvic pain.   POSTOPERATIVE DIAGNOSES:  1.  Extensive pelvic adhesions.  2.  Previous history of laparoscopic-assisted vaginal hysterectomy,      bilateral salpingo-oophorectomy for adenomyosis and pelvic      endometriosis.   OPERATIVE PROCEDURE:  Laparoscopy, lysis of extensive pelvic adhesions.   ANESTHESIA:  General.   ESTIMATED INTRAOPERATIVE BLOOD LOSS:  Less than 10 mL.   INTRAOPERATIVE COMPLICATIONS:  None.   The patient is a 55 year old who previously had a complete hysterectomy and  in the recent past has started having progressively increasing pelvic pain.  She is admitted now for laparoscopy.   The findings were no evidence of active, visible endometriosis, but  extensive pelvic adhesions.  Those findings were recorded in still  photographs retained in the office record.  Her appendix was visualized and  was normal.  There was no apparent abnormality in the upper abdomen.  The  adhesions involved the sigmoid colon and the ascending colon.   The patient was admitted on the afternoon of surgery.  She was brought to  the operating room.  She was there placed under adequate general anesthesia,  placed in the dorsal lithotomy position using the [employer] system.  Betadine prep of abdomen and perineum was carried out in the usual fashion.  The bladder was evacuated using a Robinson catheter and sterile technique.  Sponge forceps with a sponge at the tip was placed into the vagina for use  in identifying the vagina during the procedure.  Sterile drapes were  applied.  Two small incisions were made  in the abdomen, both through old  scars - one at the umbilicus and one just above the symphysis.  An 11 mm  nonbladed disposable trocar was introduced at the umbilicus while elevating  the anterior abdominal wall manually.  Direct inspection revealed adequate  placement with no evidence of injury on entry.  Pneumoperitoneum was allowed  to accumulate with carbon dioxide gas.  A 5 mm trocar was placed through the  lower incision under direct visualization.  A brunt probe and later a spring-  loaded grasping forceps was used through this trocar sleeve.  Systematic  examination of the pelvic and abdominal contents was carried out.  Numerous  adhesions of bowel to peritoneal surfaces, particularly along the left side  and at the midpoint of the vaginal cuff, were identified.  Using the  reticulated EndoShears and unipolar cautery, these adhesions were virtually  completely released.  Careful coagulation accomplished complete hemostasis.  This was confirmed by inspection under reduced intraabdominal pressure.  After completing the procedure, all instruments were removed, gas was  allowed to escape from the abdomen.  Plain Marcaine 0.25% was injected into  the incision sites.  Toradol 30 mg IV and 30 IM were given for postoperative  analgesia.  The incision sites were closed with interrupted subcuticular  sutures of 3-0 Dexon.  Steri-  Strips were applied through the lower incision. The patient was awakened,  taken to recovery in good condition.  She will be discharged in the  immediate postoperative period for follow-up in the office in approximately  2 weeks.  She was given routine outpatient surgical instructions.  She has  Vicodin which she is to use for postoperative pain.     Hosie Spangle   WRN/MEDQ  D:  07/10/2004  T:  07/10/2004  Job:  [id number]

## 2010-12-18 NOTE — Discharge Summary (Signed)
NAMECORRIE, REDER NO.:  [id number]   MEDICAL RECORD NO.:  [id number]          PATIENT TYPE:  INP   LOCATION:  5035                         FACILITY:  [facility]   PHYSICIAN:  Jene Every, M.D.    DATE OF BIRTH:  09/15/1955   DATE OF ADMISSION:  10/02/2008  DATE OF DISCHARGE:  10/06/2008                               DISCHARGE SUMMARY   ADMISSION DIAGNOSES:  1. Disk degeneration at L5-S1.  2. Uncontrolled hypertension.  3. Gastroesophageal reflux disease.  4. Anxiety.  5. Hypercholesteremia.   DISCHARGE DIAGNOSES:  1. Status post anterior lumbar fusion at L5-S1.  2. Disk degeneration at L5-S1.  3. Uncontrolled hypertension.  4. Gastroesophageal reflux disease.  5. Anxiety.  6. Hypercholesteremia.   HISTORY:  Ms. Lamia is a pleasant 55 year old female with a  longstanding history of progressive back pain.  She describes it as  severe and disabling.  She has undergone conservative treatment  including ejection as well as physical therapy with little-to-no relief  of her symptoms.  The patient did undergo a diskogram which showed  concordant pain at L5-S1.  At this point, it is felt she will benefit  from anterior fusion.  The risks and benefits of this were discussed  with the patient.  Medical clearance was obtained.  She does elect to  proceed.   PROCEDURE:  The patient was taken to the OR on October 02, 2008, and  underwent anterior lumbar fusion at L5-S1.  Dr. Liliane Bade did the  approach for the anterior fusion   SURGEON:  Jene Every, MD   ASSISTANT:  1. [name], [name]  2. Alvy Beal, MD   ANESTHESIA:  General.   COMPLICATIONS:  None.   CONSULTANT:  PT, OT, Care Management.   LABORATORY DATA:  Preoperative CBC showed white cell count 7.3,  hemoglobin 13.5, and hematocrit 38.4.  This was monitored throughout the  hospital course.  The patient's white cell count did go up to 13.4,  hemoglobin dropped to 8.8, and hematocrit  24.8.  However, the patient  remained stable.  Coagulation studies done preoperatively showed PT  12.4, INR 0.9, and PTT of 30.  Routine chemistries done preoperatively  showed normal sodium and potassium with a normal renal function.  This  was monitored throughout the hospital course.  Sodium was stable at  discharge 140, potassium at 3.7, BUN and creatinine remained stable.  GFR was greater than 60.  Routine liver function tests showed slightly  elevated ALT at 132, otherwise normal.  Hemoglobin A1c was 5.6.  Preop  urinalysis was negative.  Blood type was O+.  Postop lumbar films showed  good placement of hardware and interbody spacer.  Postoperative Doppler  showed no evidence of DVT.  I do not see a preop chest x-ray or EKG in  the chart.   HOSPITAL COURSE:  The patient was admitted, taken to the OR, and  underwent the above-stated procedure.  She was then transferred to the  PACU and then to the orthopedic floor for continued postoperative care.  On postop day #1, the patient was doing fairly well.  Vital  signs were  stable.  She noted no lower extremity pain.  Calf was soft and  nontender.  Doppler was negative for DVT.  Incision was clean and dry.  We consulted PT for out of bed with a brace.  Diet was slowly advanced.  The patient did well with therapy.  Unfortunately, later that night, she  noted increasing lower extremity symptoms.  She was evaluated by the PA  on-call who felt she just had overactivity during the day and increasing  discomfort in the lower extremities.  Medication was adjusted.  With new  medication, the patient did note significant improvement in her  symptoms.  The patient did have some issues of some mild hypotension.  Fluids were adjusted as well.  The patient did receive a bolus with  improvement.  On postop day #2, the patient did note significant improve  in her pain, decreased lower extremity symptoms, and abdominal  tenderness.  She was passing  flatus without difficulty.  She was  voiding.  Neurovascularly, she was intact.  Discharge planning was  initiated.  Blood pressure medication was held.  The patient was  continued on Lovenox for DVT prophylaxis.  She eventually did get a  consult from Cardiology secondary to hypotension.  They did change  around her medications.  The patient did have some issues with decreased  hemoglobin, but it was examined asymptomatic from that.  Motor and  neurovascular function remained intact throughout the hospital stay.  The patient continued to advance with therapy.  On October 06, 2008, the  patient was doing well.  She was voiding, had a bowel movement.  Again,  motor and neurovascular function was intact to lower extremity.  Hemoglobin was felt to be stable.  It is felt at this point the patient  was stable to be discharged home.   DISPOSITION:  The patient is discharged home with home health PT/OT.   DIET:  As tolerated, high fiber.   FOLLOWUP:  She is to follow up with Dr.  Shelle Iron in approximately 10-14  days for x-ray.   ACTIVITY:  She is to walk as tolerated utilizing her brace.  She is to  use all back precautions.  She will continue with home medications,  adjust her blood pressure medications as per Cardiology.  She is to  change her dressing daily.  She is to follow up with Cardiology in  approximately 2-3 weeks.   DISCHARGE MEDICATIONS:  As home medications as well as Percocet 1 p.o.  q.6 p.r.n. pain and Robaxin 1 p.o. q.8 p.r.n. spasm.   CONDITION ON DISCHARGE:  Stable.   FINAL DIAGNOSIS:  Status post anterior lumbar fusion at L5-S1.      [name], P.A.      Jene Every, M.D.  Electronically Signed    CS/MEDQ  D:  11/16/2008  T:  11/16/2008  Job:  [id number]

## 2010-12-18 NOTE — Assessment & Plan Note (Signed)
[facility]                         GASTROENTEROLOGY OFFICE NOTE   BRIT, CARBONELL                  MRN:          [id number]  DATE:09/21/2006                            DOB:          1956/03/02    REASON FOR CONSULTATION:  Persistent left lower quadrant discomfort.   HISTORY:  This is a 55 year old white female with a history of  hypertension, gastroesophageal reflux disease, and diarrhea predominant  irritable bowel syndrome.  She also has a family history of colon cancer  in her brother, diagnosed in his 5s.  The patient presents today for  evaluation of persistent left lower quadrant discomfort.  She reports  intermittent problems with similar left lower quadrant discomfort for  years, however, over the past 2 months the discomfort has been  persistent.  She has felt all along that her symptoms were principally  gynecologic.  She recently saw Dr. Jennette Kettle who could not identify a  particular gynecologic cause and recommended GI evaluation.  The patient  reports the discomfort as occurring daily.  It will fluctuate in  intensity, never terribly severe.  There is radiation into the left  groin.  No obvious bulge.  The discomfort is not affected by meals,  occasionally a bit sharper with urination or defecation.  She has had no  change in her bowel habits, no weight loss, and no rectal bleeding.  The  discomfort is affected by body position.  She is more comfortable when  she is lying on her right side.  The discomfort occasionally will awake  her at night.  She denies a history of kidney stones, no hematuria.  She  did undergo colonoscopy in April of 2003 for colon cancer screening.  This was performed by Dr. Arlyce Dice and said to be entirely normal.   ALLERGIES:  NO KNOWN DRUG ALLERGIES.   MEDICATIONS:  1. Toprol XL 100 mg daily.  2. Hydrochlorothiazide 10 mg (?) daily.  3. Nexium 40 mg daily.  4. Premarin once daily.  5. Also takes  Benadryl p.r.n.   PHYSICAL EXAMINATION:  Well-appearing female in no acute distress.  Blood pressure is 174/110, heart rate is 80, weight is 188.6 pounds.  HEENT:  Sclerae are anicteric, conjunctivae are pink, oral mucosa  intact, no adenopathy.  LUNGS:  Clear.  HEART:  Regular.  ABDOMEN:  Soft without tenderness, mass, or obvious hernia.  There is  some mild discomfort to deep palpation in the left lower quadrant above  the inguinal ligament.  EXTREMITIES:  Without edema.   IMPRESSION:  1. Chronic left lower quadrant discomfort.  Cause uncertain.  Question      subtle or early inguinal or femoral hernia, though certainly not      appreciated on exam.  Alternatively, she could have simple strain      of the inguinal ligament. In any event, I doubt this is a primary      GI process.  2. Family history of colon cancer in a first degree relative.  Due for      screening colonoscopy.  3. History of reflux disease.  On Nexium.  4. Hypertension.  Patient  aware of today's reading.   RECOMMENDATIONS:  1. Schedule CT scan of the abdomen and pelvis to evaluate pain and      rule out hernia not appreciated on physical exam.  2. Schedule colonoscopy for the purposes of surveillance and pain      evaluation.  Ashby Dawes of the procedure as well as the risks,      benefits, and alternatives have been reviewed.  She understood and      agreed to proceed.  3. Continue Nexium.  4. Return to Dr. Alwyn Ren regarding hypertension (advised).     Wilhemina Bonito. Marina Goodell, MD  Electronically Signed    JNP/MedQ  DD: 09/21/2006  DT: 09/21/2006  Job #: [id number]   cc:   Titus Dubin. Alwyn Ren, MD,FACP,FCCP  Varney Baas, M.D.

## 2011-08-09 ENCOUNTER — Telehealth: Payer: Self-pay | Admitting: Internal Medicine

## 2011-08-09 ENCOUNTER — Other Ambulatory Visit (INDEPENDENT_AMBULATORY_CARE_PROVIDER_SITE_OTHER): Payer: Federal, State, Local not specified - PPO

## 2011-08-09 ENCOUNTER — Encounter: Payer: Self-pay | Admitting: Physician Assistant

## 2011-08-09 ENCOUNTER — Ambulatory Visit (INDEPENDENT_AMBULATORY_CARE_PROVIDER_SITE_OTHER): Payer: Federal, State, Local not specified - PPO | Admitting: Physician Assistant

## 2011-08-09 VITALS — BP 102/78 | HR 88 | Ht 63.0 in | Wt 181.0 lb

## 2011-08-09 DIAGNOSIS — K559 Vascular disorder of intestine, unspecified: Secondary | ICD-10-CM

## 2011-08-09 DIAGNOSIS — K55059 Acute (reversible) ischemia of intestine, part and extent unspecified: Secondary | ICD-10-CM

## 2011-08-09 DIAGNOSIS — K55039 Acute (reversible) ischemia of large intestine, extent unspecified: Secondary | ICD-10-CM | POA: Insufficient documentation

## 2011-08-09 LAB — CBC WITH DIFFERENTIAL/PLATELET
Basophils Absolute: 0 10*3/uL (ref 0.0–0.1)
Basophils Relative: 0.4 % (ref 0.0–3.0)
Eosinophils Absolute: 0.1 10*3/uL (ref 0.0–0.7)
Eosinophils Relative: 0.6 % (ref 0.0–5.0)
HCT: 41 % (ref 36.0–46.0)
Hemoglobin: 14.1 g/dL (ref 12.0–15.0)
Lymphocytes Relative: 21.8 % (ref 12.0–46.0)
Lymphs Abs: 2.4 10*3/uL (ref 0.7–4.0)
MCHC: 34.4 g/dL (ref 30.0–36.0)
MCV: 91.1 fl (ref 78.0–100.0)
Monocytes Absolute: 0.9 10*3/uL (ref 0.1–1.0)
Monocytes Relative: 8.3 % (ref 3.0–12.0)
Neutro Abs: 7.7 10*3/uL (ref 1.4–7.7)
Neutrophils Relative %: 68.9 % (ref 43.0–77.0)
Platelets: 300 10*3/uL (ref 150.0–400.0)
RBC: 4.5 Mil/uL (ref 3.87–5.11)
RDW: 13.1 % (ref 11.5–14.6)
WBC: 11.2 10*3/uL — ABNORMAL HIGH (ref 4.5–10.5)

## 2011-08-09 LAB — BASIC METABOLIC PANEL
BUN: 19 mg/dL (ref 6–23)
CO2: 30 mEq/L (ref 19–32)
Calcium: 10 mg/dL (ref 8.4–10.5)
Chloride: 100 mEq/L (ref 96–112)
Creatinine, Ser: 1.1 mg/dL (ref 0.4–1.2)
GFR: 53.06 mL/min — ABNORMAL LOW (ref >60.00)
Glucose, Bld: 100 mg/dL — ABNORMAL HIGH (ref 70–99)
Potassium: 3.9 mEq/L (ref 3.5–5.1)
Sodium: 139 mEq/L (ref 135–145)

## 2011-08-09 MED ORDER — DICYCLOMINE HCL 10 MG PO CAPS: ORAL_CAPSULE | ORAL | Status: DC

## 2011-08-09 NOTE — Progress Notes (Signed)
Subjective:    Patient ID: [name], female    DOB: 02/29/1956, 56 y.o.   MRN: [id number]  HPI [name] is a 56 year old female known to Dr. Marina Goodell with prior history of colon polyps and family history of colon cancer. She last had a colonoscopy in January of 2012 and had 2 very small polyps  removed. There was not adequate tissue to send to path. Colonoscopy done in 2008 revealed 1 adenomatous polyp.  She comes in today after an acute episode onset Saturday, 08/06/2010 with severe lower abdominal cramping associated with diaphoresis nausea then diarrhea followed by bloody bowel movements with clots. She said she felt horrible most of Saturday and Sunday and her symptoms started easing off last evening. She has not had any further bowel movements or bleeding today and has been able to eat. She says she feels weak and has mild crampy abdominal pain but otherwise feels much better. She relates a milder but similar episode in December of 2012 and also was seen here about a year and a half ago with a similar episode.  Her only new medication is phentermine which she had been taking in in December and was taking just prior to this episode. She also is on Estrace daily. She is a nonsmoker.      Review of Systems  Constitutional: Positive for appetite change.  HENT: Negative.   Eyes: Negative.   Respiratory: Negative.   Cardiovascular: Negative.   Gastrointestinal: Positive for nausea, abdominal pain, diarrhea and blood in stool.  Genitourinary: Negative.   Skin: Negative.   Neurological: Positive for weakness.  Hematological: Negative.   Psychiatric/Behavioral: Negative.     Outpatient Encounter Prescriptions as of 08/09/2011  Medication Sig Dispense Refill  . ALPRAZolam (XANAX) 0.5 MG tablet Take 0.5 mg by mouth at bedtime as needed. As needed.       Marland Kitchen aspirin 325 MG tablet Take 325 mg by mouth daily.        Marland Kitchen esomeprazole (NEXIUM) 40 MG capsule Take 40 mg by mouth daily before  breakfast. As needed       . estradiol (ESTRACE) 0.5 MG tablet Take 0.5 mg by mouth daily.        Marland Kitchen lisinopril (PRINIVIL,ZESTRIL) 30 MG tablet 1 tab two times daily      . phentermine 15 MG capsule Take 15 mg by mouth every morning.        Marland Kitchen DISCONTD: dicyclomine (BENTYL) 10 MG capsule Take 10 mg by mouth 4 (four) times daily -  before meals and at bedtime. Take 1 tab twice daily as needed for cramping and abdominal discomfort.       . dicyclomine (BENTYL) 10 MG capsule Take 1 tab every 6 hours as needed for cramping and spasms.  90 capsule  1   No Known Allergies     Objective:   Physical Exam well-developed white female in no acute distress blood pressure 102/78 pulse 88. HEENT; nontraumatic normocephalic EOMI PERRLA sclera anicteric, neck; supple no JVD, Cardiovascular; regular rate and rhythm with S1-S2 no murmur or gallop, Pulmonary; clear bilaterally, Abdomen; large soft tender in the left mid quadrant left lower quadrant there is no guarding no rebound no palpable mass or hepatosplenomegaly bowel sounds are active no bruit, Rectal exam not done, Extremities; no clubbing cyanosis or edema, Psych; mood and affect appropriate        Assessment & Plan:  #3 56 year old female with acute episode of severe lower abdominal cramping diaphoresis diarrhea and bloody  stools onset 08/06/2010 and now resolving. Her symptoms are most consistent with an acute segmental ischemic colitis, probably precipitated by phentermine. I am concerned by the recurrent nature of her symptoms, this being the third episode over the past couple of years. The milder episode that she had last month again was while she was taking phentermine.  Plan; long discussion with patient regarding segmental ischemic colitis. She is advised to stop taking phentermine and not resume. She will push fluids and gradually advanced to a soft diet. Will schedule for a CT of the abdomen with mesenteric angiogram to rule out any large vessel  disease  #2 history of adenomatous colon polyps, last colonoscopy January 2012, she is followed at five-year intervals #3 positive family history of colon cancer.

## 2011-08-09 NOTE — Patient Instructions (Signed)
Please go to the basement level to have your labs drawn.  Push fluids. Eat a bland soft diet. Stop the Phentermine medication.   You have been scheduled for a CT scan of the abdomen and pelvis at [facility] ([address]---this is in the same building as [facility]).   You are scheduled on FrIday 08-13-2011 at 10:30 am . You should arrive 15 minutes prior to your appointment time for registration. Please follow the written instructions below on the day of your exam:  WARNING: IF YOU ARE ALLERGIC TO IODINE/X-RAY DYE, PLEASE NOTIFY RADIOLOGY IMMEDIATELY AT [phone]! YOU WILL BE GIVEN A 13 HOUR PREMEDICATION PREP.  1) Do not eat or drink anything after 6:30 AM  (4 hours prior to your test)  You may take any medications as prescribed with a small amount of water except for the following: Metformin, Glucophage, Glucovance, Avandamet, Riomet, Fortamet, Actoplus Met, Janumet, Glumetza or Metaglip. The above medications must be held the day of the exam AND 48 hours after the exam. If you have any questions regarding your exam or if you need to reschedule, you may call the CT department at [phone] between the hours of 8:00 am and 5:00 pm, Monday-Friday.  ________________________________________________________________________

## 2011-08-09 NOTE — Telephone Encounter (Signed)
Pt states she has been having some rectal bleeding, bright red blood. States this weekend she had quite a lot, states it colored the toilet bowl red. Pt scheduled to see Amy Esterwood PA today at 3pm. Pt aware of appt date and time.

## 2011-08-11 ENCOUNTER — Telehealth: Payer: Self-pay | Admitting: *Deleted

## 2011-08-11 NOTE — Telephone Encounter (Signed)
Left a message for patient to call me. 

## 2011-08-11 NOTE — Telephone Encounter (Signed)
Message copied by Daphine Deutscher on Wed Aug 11, 2011  4:54 PM ------      Message from: [place]el, Virginia S      Created: Wed Aug 11, 2011  4:15 PM       Please let [name] know her labs look fine-wbc up a little consistent with ischemic colitis, hgb is normal- see how she is feeling

## 2011-08-12 NOTE — Telephone Encounter (Signed)
Spoke with patient and gave her lab results as per Mike Gip, PA. She is feeling better.

## 2011-08-13 ENCOUNTER — Ambulatory Visit (INDEPENDENT_AMBULATORY_CARE_PROVIDER_SITE_OTHER)
Admission: RE | Admit: 2011-08-13 | Discharge: 2011-08-13 | Disposition: A | Discharge status: Home / Self Care | Payer: Federal, State, Local not specified - PPO | Source: Ambulatory Visit | Attending: Physician Assistant | Admitting: Physician Assistant

## 2011-08-13 DIAGNOSIS — K559 Vascular disorder of intestine, unspecified: Secondary | ICD-10-CM

## 2011-08-13 MED ORDER — IOHEXOL 350 MG/ML SOLN
100.0000 mL | Freq: Once | INTRAVENOUS | Status: AC | PRN
Start: 2011-08-13 — End: 2011-08-13
  Administered 2011-08-13: 100 mL via INTRAVENOUS

## 2011-08-17 ENCOUNTER — Telehealth: Payer: Self-pay | Admitting: *Deleted

## 2011-08-17 NOTE — Telephone Encounter (Signed)
Message copied by Daphine Deutscher on Tue Aug 17, 2011  2:37 PM ------      Message from: [place], Virginia S      Created: Tue Aug 17, 2011 10:54 AM       Please let [name] know her CT angiogram looks fine-large blood vessels are all patent(open)

## 2011-08-17 NOTE — Telephone Encounter (Signed)
Left a message for patient to call me. 

## 2011-08-18 NOTE — Telephone Encounter (Signed)
Spoke with patient and gave her results as per Mike Gip, PA . Patient wants to know what she should do next. Please,advise.

## 2011-08-18 NOTE — Telephone Encounter (Signed)
Nothing else needs to be done at this point-she should be feeling better from her episode of ischemic colitis. Advance diet as she tolerates,stay hydrated, and stop taking phentermine, and do not restart it  As it probably precipitated this episode.

## 2011-08-18 NOTE — Telephone Encounter (Signed)
Left a message for patient to call me. 

## 2011-08-19 NOTE — Telephone Encounter (Signed)
Patient left a message to call her at [phone]. Called patient and left a message at this number.

## 2011-08-19 NOTE — Telephone Encounter (Signed)
Left a message for patient to call me back.

## 2011-08-19 NOTE — Telephone Encounter (Signed)
Spoke with patient and gave her Amy Esterwood, PA recommendations. 

## 2011-10-06 ENCOUNTER — Telehealth: Payer: Self-pay | Admitting: Internal Medicine

## 2011-10-06 NOTE — Telephone Encounter (Signed)
Pt states she started having vomiting and diarrhea last night around 11pm. Pt thinks she has a virus. States she has not been able to keep anything down, not even liquids. States she is having abdominal cramping but she is afraid that she would not be able to keep the bentyl that she has down. Her bottom is raw from the diarrhea also. Dr. Marina Goodell please advise.

## 2011-10-06 NOTE — Telephone Encounter (Signed)
Spoke with pt. Offered her an appt with the PA today or tomorrow. Pt requested an am appt tomorrow. Appt 10/07/11@8 :30am. Let pt know she could try taking Imodium OTC for the diarrhea and to use the charmin wipes instead of toilet paper. Pt verbalized understanding.

## 2011-10-06 NOTE — Telephone Encounter (Signed)
I'M OUT OF THE OFFICE. SEE THE EXTENDER. THANKS

## 2011-10-07 ENCOUNTER — Inpatient Hospital Stay (HOSPITAL_COMMUNITY)
Admission: AD | Admit: 2011-10-07 | Discharge: 2011-10-09 | DRG: 297 | Disposition: A | Discharge status: Home / Self Care | Payer: Federal, State, Local not specified - PPO | Source: Ambulatory Visit | Attending: Internal Medicine | Admitting: Internal Medicine

## 2011-10-07 ENCOUNTER — Encounter (HOSPITAL_COMMUNITY): Payer: Self-pay | Admitting: General Practice

## 2011-10-07 ENCOUNTER — Encounter: Payer: Self-pay | Admitting: Physician Assistant

## 2011-10-07 ENCOUNTER — Ambulatory Visit (INDEPENDENT_AMBULATORY_CARE_PROVIDER_SITE_OTHER): Payer: Federal, State, Local not specified - PPO | Admitting: Physician Assistant

## 2011-10-07 VITALS — BP 102/66 | HR 72 | Ht 62.0 in | Wt 185.0 lb

## 2011-10-07 DIAGNOSIS — R197 Diarrhea, unspecified: Secondary | ICD-10-CM

## 2011-10-07 DIAGNOSIS — A088 Other specified intestinal infections: Secondary | ICD-10-CM | POA: Diagnosis present

## 2011-10-07 DIAGNOSIS — Z860100 Personal history of colon polyps, unspecified: Secondary | ICD-10-CM

## 2011-10-07 DIAGNOSIS — E860 Dehydration: Principal | ICD-10-CM | POA: Diagnosis present

## 2011-10-07 DIAGNOSIS — Z7982 Long term (current) use of aspirin: Secondary | ICD-10-CM

## 2011-10-07 DIAGNOSIS — I10 Essential (primary) hypertension: Secondary | ICD-10-CM | POA: Diagnosis present

## 2011-10-07 DIAGNOSIS — K529 Noninfective gastroenteritis and colitis, unspecified: Secondary | ICD-10-CM

## 2011-10-07 DIAGNOSIS — R7309 Other abnormal glucose: Secondary | ICD-10-CM | POA: Diagnosis present

## 2011-10-07 DIAGNOSIS — K5289 Other specified noninfective gastroenteritis and colitis: Secondary | ICD-10-CM

## 2011-10-07 DIAGNOSIS — E876 Hypokalemia: Secondary | ICD-10-CM | POA: Diagnosis present

## 2011-10-07 DIAGNOSIS — K219 Gastro-esophageal reflux disease without esophagitis: Secondary | ICD-10-CM | POA: Diagnosis present

## 2011-10-07 DIAGNOSIS — K589 Irritable bowel syndrome without diarrhea: Secondary | ICD-10-CM | POA: Diagnosis present

## 2011-10-07 HISTORY — DX: Gastrointestinal hemorrhage, unspecified: K92.2

## 2011-10-07 HISTORY — DX: Personal history of other diseases of the digestive system: Z87.19

## 2011-10-07 LAB — COMPREHENSIVE METABOLIC PANEL
ALT: 42 U/L — ABNORMAL HIGH (ref 0–35)
Alkaline Phosphatase: 127 U/L — ABNORMAL HIGH (ref 39–117)
BUN: 22 mg/dL (ref 6–23)
CO2: 24 mEq/L (ref 19–32)
Chloride: 101 mEq/L (ref 96–112)
GFR calc Af Amer: 67 mL/min — ABNORMAL LOW (ref >90)
GFR calc non Af Amer: 58 mL/min — ABNORMAL LOW (ref >90)
Glucose, Bld: 132 mg/dL — ABNORMAL HIGH (ref 70–99)
Potassium: 3.3 mEq/L — ABNORMAL LOW (ref 3.5–5.1)
Sodium: 138 mEq/L (ref 135–145)
Total Bilirubin: 0.4 mg/dL (ref 0.3–1.2)

## 2011-10-07 LAB — CBC
HCT: 40 % (ref 36.0–46.0)
Hemoglobin: 13.9 g/dL (ref 12.0–15.0)
MCHC: 34.8 g/dL (ref 30.0–36.0)
RBC: 4.5 MIL/uL (ref 3.87–5.11)
WBC: 17 10*3/uL — ABNORMAL HIGH (ref 4.0–10.5)

## 2011-10-07 LAB — PROTIME-INR: INR: 1.05 (ref 0.00–1.49)

## 2011-10-07 MED ORDER — HYDROMORPHONE HCL PF 1 MG/ML IJ SOLN: 0.5000 mg | INTRAMUSCULAR | Status: DC | PRN

## 2011-10-07 MED ORDER — KCL IN DEXTROSE-NACL 20-5-0.45 MEQ/L-%-% IV SOLN
INTRAVENOUS | Status: DC
  Administered 2011-10-07 – 2011-10-09 (×5): via INTRAVENOUS
  Filled 2011-10-07 (×8): qty 1000

## 2011-10-07 MED ORDER — SODIUM CHLORIDE 0.9 % IV BOLUS (SEPSIS)
500.0000 mL | Freq: Once | INTRAVENOUS | Status: AC
  Administered 2011-10-07: 500 mL via INTRAVENOUS

## 2011-10-07 MED ORDER — ONDANSETRON HCL 4 MG PO TABS: 4.0000 mg | ORAL_TABLET | Freq: Four times a day (QID) | ORAL | Status: DC | PRN

## 2011-10-07 MED ORDER — ACETAMINOPHEN 325 MG PO TABS
650.0000 mg | ORAL_TABLET | Freq: Four times a day (QID) | ORAL | Status: DC | PRN
  Administered 2011-10-07 – 2011-10-08 (×4): 650 mg via ORAL
  Filled 2011-10-07 (×4): qty 2

## 2011-10-07 MED ORDER — HYDROCODONE-ACETAMINOPHEN 5-325 MG PO TABS: 1.0000 | ORAL_TABLET | ORAL | Status: DC | PRN

## 2011-10-07 MED ORDER — ALPRAZOLAM 0.5 MG PO TABS: 0.5000 mg | ORAL_TABLET | Freq: Every evening | ORAL | Status: DC | PRN

## 2011-10-07 MED ORDER — PANTOPRAZOLE SODIUM 40 MG PO TBEC
40.0000 mg | DELAYED_RELEASE_TABLET | Freq: Every day | ORAL | Status: DC
  Administered 2011-10-07 – 2011-10-09 (×3): 40 mg via ORAL
  Filled 2011-10-07 (×4): qty 1

## 2011-10-07 MED ORDER — DICYCLOMINE HCL 10 MG PO CAPS
10.0000 mg | ORAL_CAPSULE | Freq: Four times a day (QID) | ORAL | Status: DC | PRN
  Filled 2011-10-07: qty 1

## 2011-10-07 MED ORDER — ACETAMINOPHEN 650 MG RE SUPP: 650.0000 mg | Freq: Four times a day (QID) | RECTAL | Status: DC | PRN

## 2011-10-07 MED ORDER — ESTRADIOL 1 MG PO TABS
0.5000 mg | ORAL_TABLET | Freq: Every day | ORAL | Status: DC
  Administered 2011-10-07 – 2011-10-08 (×2): 0.5 mg via ORAL
  Administered 2011-10-09: 10:00:00 via ORAL
  Filled 2011-10-07 (×3): qty 0.5

## 2011-10-07 MED ORDER — ONDANSETRON HCL 4 MG/2ML IJ SOLN: 4.0000 mg | Freq: Four times a day (QID) | INTRAMUSCULAR | Status: DC | PRN

## 2011-10-07 NOTE — Progress Notes (Addendum)
Subjective:    Patient ID: [name], female    DOB: [birth year], 56 y.o.   MRN: [id number]  HPI [name] is a 56 year old female known to Dr. Marina Goodell  has history of chronic GERD. She had endoscopy in January 2012 at that time had a distal esophageal stricture dilated. She also has history of colon polyps with last colonoscopy in March of 2008. She has had 2 prior episodes of segmental ischemic colitis and I last saw her in January 2013 at which time she had an acute ischemic colitis. This episode may have been precipitated by a combination of asterixis and phentermine. She did undergo CT angiogram abdomen at that time which did not show any evidence for mesenteric artery stenosis.  Patient comes in today as an urgent work in with acute onset of illness on March 5 in the evening with abrupt onset of nausea vomiting and diarrhea. She says she was having watery diarrheal stools about every 30 minutes for least 24 hours has had chills ,severe fatigue, headache and some backache as well as generalized abdominal cramping. She said she vomited multiple times over the first 24 hours. Last evening a friend brought her some Zofran which  did seem to help stop the vomiting. She feels incredibly weak and says that if she had enough energy to dig a hole in  the backyard should she would have put herself in it... Her  diarrhea has slowed down and she has not vomited this morning.    Review of Systems  Constitutional: Positive for chills, activity change, appetite change and fatigue.  Eyes: Negative.   Respiratory: Negative.   Cardiovascular: Negative.   Gastrointestinal: Positive for nausea, vomiting, abdominal pain and diarrhea.  Genitourinary: Negative.   Musculoskeletal: Positive for back pain.  Neurological: Positive for headaches.  Hematological: Negative.   Psychiatric/Behavioral: Negative.    Outpatient Prescriptions Prior to Visit  Medication Sig Dispense Refill  . ALPRAZolam (XANAX) 0.5 MG  tablet Take 0.5 mg by mouth at bedtime as needed. As needed.       Marland Kitchen aspirin 325 MG tablet Take 325 mg by mouth daily.        Marland Kitchen dicyclomine (BENTYL) 10 MG capsule Take 1 tab every 6 hours as needed for cramping and spasms.  90 capsule  1  . esomeprazole (NEXIUM) 40 MG capsule Take 40 mg by mouth daily before breakfast. As needed       . estradiol (ESTRACE) 0.5 MG tablet Take 0.5 mg by mouth daily.        Marland Kitchen lisinopril (PRINIVIL,ZESTRIL) 30 MG tablet 1 tab two times daily      . phentermine 15 MG capsule Take 15 mg by mouth every morning.            No Known Allergies Patient Active Problem List  Diagnoses  . DIARRHEA OF PRESUMED INFECTIOUS ORIGIN  . HYPERTENSION  . GERD  . IRRITABLE BOWEL SYNDROME  . RECTAL BLEEDING  . DYSPHAGIA UNSPECIFIED  . PERSONAL HX COLONIC POLYPS  . Acute ischemic colitis    Objective:   Physical Exam well-developed acutely ill-appearing white female lying on the table blood pressure 102/66 pulse 104. HEENT; nontraumatic normocephalic EOMI PERRLA sclera anicteric oropharynx is quite dry and tongue is furrowed.Neck; Supple no JVD on Cardiovascular; tachycardia regular rhythm with S1-S2 pulmonary clear bilaterally, Abdomen ;soft mild midabdominal tenderness nonfocal no guarding no rebound bowel sounds are hyperactive no palpable mass or hepatosplenomegaly, Rectal; not done, Extremities no clubbing cyanosis or edema,  Psych; mood and affect normal and appropriate        Assessment & Plan:  #22 56 year old female with probable acute viral gastroenteritis presenting with severe nausea vomiting diarrhea and now dehydration. Plan; patient requires hydration and will be admitted to [facility] on observation for volume repletion and supportive management. For details please see the orders.  Reviewed, patient seen, and I agree with management. Plan for acute admission as outlined above. Further recommendations per inpatient team Jay M. Pyrtle, M.D.   10/07/2011

## 2011-10-07 NOTE — H&P (Signed)
Primary Care Physician:  Pcp Not In System Primary Gastroenterologist:  Dr. Yancey Flemings  CHIEF COMPLAINT:  Nausea, vomiting diarrhea and weakness  HPI: [name] is a 56 y.o. female known to Dr. Marina Goodell with history of chronic GERD. She had endoscopy in January of 2012 and had a distal esophageal stricture dilated at that time. She is maintained on Nexium once daily. She'll she also has history of colon polyps and underwent colonoscopy in March ,2008.Marland Kitchen She has had 2 prior episodes of segmental ischemic colitis. I saw her in January 2013 with an acute episode of segmental ischemia which may been precipitated by a combination of Estrace and phentermine. She did undergo CT angiogram of the abdomen at that time which did not show any evidence of for mesenteric artery stenosis. She says she's been doing fine in the interim until she had acute onset on Tuesday evening March 5 with abrupt onset of nausea vomiting and diarrhea. She said she was having voluminous watery diarrhea about every 30 minutes for at least 24 hours and felt well yesterday and stayed in bed. She had multiple episodes of vomiting and has not cut down any by mouth is acceptable couple of crackers. She has been having associated chills no documented fever she complains of a headache and also has had a low backache. She's had some generalized abdominal cramping. No melena or hematochezia. She says she's dizzy and lightheaded when standing. She's not had any known sick exposures lives alone and has not been on any recent antibiotics.   Past Medical History  Diagnosis Date  . Esophageal stricture   . Duodenitis   . Gastritis   . Tubular adenoma polyp of rectum 2008  . Hypertension   . GERD (gastroesophageal reflux disease)   . IBS (irritable bowel syndrome)   . Ischemic colitis     Past Surgical History  Procedure Date  . Abdominal hysterectomy   . Back surgery 09/2008  . Tonsillectomy   . Hammer toe surgery     bilateral      Prior to Admission medications   Medication Sig Start Date End Date Taking? Authorizing Provider  ALPRAZolam Prudy Feeler) 0.5 MG tablet Take 0.5 mg by mouth at bedtime as needed. As needed.    Yes Historical Provider, MD  aspirin 325 MG tablet Take 325 mg by mouth daily.     Yes Historical Provider, MD  dicyclomine (BENTYL) 10 MG capsule Take 1 tab every 6 hours as needed for cramping and spasms. 08/09/11  Yes Neve Branscomb, PA  esomeprazole (NEXIUM) 40 MG capsule Take 40 mg by mouth daily before breakfast. As needed    Yes Historical Provider, MD  estradiol (ESTRACE) 0.5 MG tablet Take 0.5 mg by mouth daily.     Yes Historical Provider, MD  lisinopril (PRINIVIL,ZESTRIL) 30 MG tablet 1 tab two times daily   Yes Historical Provider, MD  ondansetron (ZOFRAN) 4 MG tablet Take 4 mg by mouth every 8 (eight) hours as needed.   Yes Historical Provider, MD  phentermine 15 MG capsule Take 15 mg by mouth every morning.      Historical Provider, MD    No current outpatient prescriptions on file.    Allergies as of 10/07/2011  . (No Known Allergies)    Family History  Problem Relation Age of Onset  . Colon cancer Brother   . Kidney disease Mother   . Stroke Father   . Heart attack Brother     History   Social  History  . Marital Status: Single    Spouse Name: N/A    Number of Children: 2  . Years of Education: N/A   Occupational History  . Drug Programmer, [employer]   Social History Main Topics  . Smoking status: Never Smoker   . Smokeless tobacco: Never Used  . Alcohol Use: No  . Drug Use: No  . Sexually Active: Not on file   Other Topics Concern  . Not on file   Social History Narrative   Daily caffeine use.    Review of Systems: Pertinent positive and negative review of systems were noted in the above HPI section.  All other review of systems was otherwise negative. Marland Kitchen  Physical Exam: Vital signs in last 24 hours: @VSRANGES @   General:   Alert,   Well-developed, acutely ill-appearing middle-aged white female in no acute distress blood pressure 102/66 pulse 104 Head:  Normocephalic and atraumatic. Eyes:  Sclera clear, no icterus.   Conjunctiva pink. Ears:  Normal auditory acuity. Nose:  No deformity, discharge,  or lesions. Mouth:  No deformity or lesions.  Oropharynx dry and tongue cracked and further Neck:  Supple; no masses or thyromegaly. Lungs:  Clear throughout to auscultation.   No wheezes, crackles, or rhonchi. No acute distress. Heart: Tachycardia Regular rate and rhythm; no murmurs, clicks, rubs,  or gallops. Abdomen:  Soft,tender mid abdomen, non focally and nondistended, BS hyperactive, no mass or hsm, .  without guarding, and without rebound.   Rectal:  Deferred  Msk:  Symmetrical without gross deformities. Normal posture. Pulses:  Normal pulses noted. Extremities:  Without clubbing or edema. Neurologic:  Alert and  oriented x4;  grossly normal neurologically. Skin:  Intact without significant lesions or rashes. Cervical Nodes:  No significant cervical adenopathy. Psych:  Alert and cooperative. Normal mood and affect.  Intake/Output from previous day:   Intake/Output this shift: @IOTHISSHIFT @     Studies/Results: No results found.  Impression / Plan: #69 57 year-old female with probable acute viral gastroenteritis complicated by dehydration. #2 chronic GERD with history of peptic stricture #3 history of segmental ischemic colitis #4 colon polyps, last colonoscopy 2008 #5 hypertension  Plan; patient is admitted to the GI service for IV fluid rehydration and supportive management with antibiotics and antispasmodics. Do not feel that she needs any further diagnostic evaluation initially. Will start clear liquids Round-the-clock Zofran When necessary Bentyl for cramping Hopefully can  be discharged in 24-48 hours after she is rehydrated.      @RRHLOS @  Allea Kassner  10/07/2011, 10:04 AM

## 2011-10-07 NOTE — Progress Notes (Signed)
Dr. Marina Goodell notified of pts elevated temperature. No new orders.

## 2011-10-07 NOTE — Progress Notes (Signed)
UR completed 

## 2011-10-08 DIAGNOSIS — R197 Diarrhea, unspecified: Secondary | ICD-10-CM

## 2011-10-08 DIAGNOSIS — K5289 Other specified noninfective gastroenteritis and colitis: Secondary | ICD-10-CM

## 2011-10-08 DIAGNOSIS — K529 Noninfective gastroenteritis and colitis, unspecified: Secondary | ICD-10-CM

## 2011-10-08 MED ORDER — POTASSIUM CHLORIDE CRYS ER 20 MEQ PO TBCR
20.0000 meq | EXTENDED_RELEASE_TABLET | Freq: Once | ORAL | Status: DC
  Filled 2011-10-08: qty 1

## 2011-10-08 NOTE — Progress Notes (Signed)
     [facility] Daily Rounding Note 10/08/2011, 9:27 AM  SUBJECTIVE:       Stools less frequent, still watery and incontinent. Some abdominal cramps but no severe abdominal pain. No nausea vomiting. On clears but has not consumed days, she remains anorexic. Fever to 102.7 overnight, received Tylenol and fever curve trending down.   OBJECTIVE:        General: looks moderately ill, nontoxic.     Vital signs in last 24 hours:    Temp:  [100.3 F (37.9 C)-102.7 F (39.3 C)] 100.3 F (37.9 C) (03/08 0600) Pulse Rate:  [97-132] 97  (03/08 0600) Resp:  [16-18] 16  (03/08 0600) BP: (91-131)/(57-82) 106/70 mmHg (03/08 0600) SpO2:  [92 %-97 %] 96 % (03/08 0600) Weight:  [185 lb (83.915 kg)] 185 lb (83.915 kg) (03/07 2100) Last BM Date: 10/07/11  Heart: regular rate and rhythm, no murmurs rubs gallops. Chest: clear to auscultation and percussion bilaterally. Not dyspneic Abdomen: soft, active bowel sounds, nondistended. Slightly tender in mid upper abdomen, no guarding or rebound.  Extremities: no pedal edema Neuro/Psych:  Fully oriented, appropriate. No gross deficits.  Intake/Output from previous day: 03/07 0701 - 03/08 0700 In: 1259.2 [P.O.:680; I.V.:579.2] Out: -   Intake/Output this shift:    Lab Results:  Basename 10/07/11 1030  WBC 17.0*  HGB 13.9  HCT 40.0  PLT 265   BMET  Basename 10/07/11 1030  NA 138  K 3.3*  CL 101  CO2 24  GLUCOSE 132*  BUN 22  CREATININE 1.06  CALCIUM 9.2   LFT  Basename 10/07/11 1030  PROT 8.1  ALBUMIN 3.7  AST 39*  ALT 42*  ALKPHOS 127*  BILITOT 0.4  BILIDIR --  IBILI --   PT/INR  Basename 10/07/11 1030  LABPROT 13.9  INR 1.05      Studies/Results: No results found.  ASSESMENT: 1.  Acute viral gastroenteritis with dehydration.  Background of chronic GERD 2.  Hx ischemic colitis, colon polyps. 3.  Elevated Alk phos, AST and ALT. Gallbladder intact. 4.  Hyperglycemia.   5.  Hypokalemia.  PLAN: 1.  Supplement  with oral potassium tablet 2.   CMET and CBC in a.m. 3.  Continue clear liquids, continue IV fluids at 125 per hour   LOS: 1 day   Jennye Moccasin  10/08/2011, 9:27 AM Pager: [phone]   GI ATTENDING  SEEN AND EXAMINED. AGREE WITH ABOVE. NEEDS ANOTHER DAY IN THE HOSPITAL HOPEFULLY HOME IN AM  Chandria Rookstool N. Eda Keys., M.D. [facility] Division of Gastroenterology

## 2011-10-09 LAB — COMPREHENSIVE METABOLIC PANEL
Alkaline Phosphatase: 99 U/L (ref 39–117)
BUN: 8 mg/dL (ref 6–23)
GFR calc Af Amer: >90 mL/min (ref >90)
GFR calc non Af Amer: 80 mL/min — ABNORMAL LOW (ref >90)
Glucose, Bld: 96 mg/dL (ref 70–99)
Potassium: 3.5 mEq/L (ref 3.5–5.1)
Total Protein: 6 g/dL (ref 6.0–8.3)

## 2011-10-09 LAB — CBC
HCT: 34 % — ABNORMAL LOW (ref 36.0–46.0)
Hemoglobin: 11.5 g/dL — ABNORMAL LOW (ref 12.0–15.0)
MCH: 29.9 pg (ref 26.0–34.0)
MCHC: 33.8 g/dL (ref 30.0–36.0)

## 2011-10-09 NOTE — Progress Notes (Signed)
     [facility] Daily Rounding Note 10/09/2011, 10:54 AM  SUBJECTIVE:       No BM.  Feels well.  No nausea.  Tolerating clears.  No abdominal pain.  No dizzyness.  No further fevers. No stools since admission so Norvirus stool studies have not been sent.  OBJECTIVE:        General: Looks well     Vital signs in last 24 hours:    Temp:  [97.8 F (36.6 C)-99.4 F (37.4 C)] 98.3 F (36.8 C) (03/09 0600) Pulse Rate:  [77-88] 79  (03/09 0600) Resp:  [18] 18  (03/09 0600) BP: (108-129)/(67-85) 126/80 mmHg (03/09 0600) SpO2:  [93 %-95 %] 95 % (03/09 0600) Last BM Date: 10/08/11  Heart: RRR Chest: Clear Abdomen:  Soft, NT, ND.  Active BS  Extremities: no edema Neuro/Psych:  Pleasant, not confused or anxiuous.  Intake/Output from previous day: 03/08 0701 - 03/09 0700 In: 2875 [I.V.:2875] Out: -   Intake/Output this shift:    Lab Results:  Basename 10/09/11 0600 10/07/11 1030  WBC 6.4 17.0*  HGB 11.5* 13.9  HCT 34.0* 40.0  PLT 174 265   BMET  Basename 10/09/11 0600 10/07/11 1030  NA 140 138  K 3.5 3.3*  CL 107 101  CO2 25 24  GLUCOSE 96 132*  BUN 8 22  CREATININE 0.81 1.06  CALCIUM 8.5 9.2   LFT  Basename 10/09/11 0600 10/07/11 1030  PROT 6.0 8.1  ALBUMIN 2.6* 3.7  AST 94* 39*  ALT 142* 42*  ALKPHOS 99 127*  BILITOT 0.4 0.4  BILIDIR -- --  IBILI -- --   PT/INR  Basename 10/07/11 1030  LABPROT 13.9  INR 1.05   ASSESMENT: IMPRESSION:   1. Acute viral gastroenteritis with dehydration. Background of chronic GERD.  WBC count now normal, no further fevers. No stools since admission.  2. Hx ischemic colitis, colon polyps.  3. Elevated Alk phos, AST and ALT. Gallbladder intact.  4. Hyperglycemia. Not recurrent this AM 5. Hypokalemia.  Resolved   PLAN: 1.  D/C home today.       LOS: 2 days   Jennye Moccasin  10/09/2011, 10:54 AM Pager: [phone]   AGREE . SEE D/C SUMMARY

## 2011-10-09 NOTE — Discharge Summary (Signed)
[facility] Discharge Summary  Name: [name] MRN: [id number] DOB: 06/03/1956 56 y.o. PCP:   She has no primary care physician, tends to go to urgent care as needed  Date of Admission: 10/07/2011  9:50 AM Date of Discharge: 10/09/2011 Attending Physician: Hilarie Fredrickson, MD  Admitting Dignosis: Active Problems:  Gastroenteritis  Past Medical History  Diagnosis Date  . Esophageal stricture   . Duodenitis   . Gastritis   . Tubular adenoma polyp of rectum 2008  . Hypertension   . GERD (gastroesophageal reflux disease)   . IBS (irritable bowel syndrome)   . Ischemic colitis   . H/O hiatal hernia   . Lower GI bleeding     10/07/11 "I've had 3 episodes in the past year"  . History of esophageal stricture    Past Surgical History  Procedure Date  . Abdominal hysterectomy   . Back surgery 09/2008  . Hammer toe surgery     bilateral  . Tonsillectomy and adenoidectomy     "as a child"  . Esophageal dilation 08/2010     Discharge Diagnosis: Active Problems:  Gastroenteritis  Dehydration secondary to gastroenteritis  Consultations:  None   Procedures Performed:  none   Admission HPI:  [name] is a 56 y.o. female known to Dr. Marina Goodell with history of chronic GERD. Endoscopy in January, 2012 showed a distal esophageal stricture which was dilated. She takes Nexium once daily. She also has history of colon polyps on colonoscopy in March,2008. She has had 2 previou episodes of segmental ischemic colitis.   An episode inJanuary 2013 may been precipitated by a combination of Estrace and phentermine. CT angiogram of the abdomen at that time did not show evidence of mesenteric artery stenosis.  She says she's been doing fine in the interim until she had acute onset on Tuesday evening March 5 with abrupt onset of nausea, vomiting and diarrhea. She had voluminous watery diarrhea about every 30 minutes for at least 24 hours and felt unwell yesterday and stayed in bed.  She had multiple episodes of vomiting and had eaten only a few crackers. She had chills but no documented fever.  She had headache and low backache along with generalized abdominal cramping. No melena or hematochezia. She's dizzy and lightheaded when standing. She's had no known sick exposures, lives alone and has not been on any recent antibiotics.  After being seen at the [facility] gastroenterology office, on March 7th, she was admitted to the hospital for supportive care of presumed viral gastroenteritis, possibly norvirus.     Hospital Course by problem list: Active Problems:  * Gastroenteritis     Patient was admitted to mount telemetry and and started on IV fluids, receiving bolus fluids to begin with. PCR stool studies to check for the presence of Norvirus were ordered. Overnight, she had fever to 102.7 which is treated with Tylenol.  CBC revealed elevated white blood cell count at 17. She was kept on a diet clear liquids. Over the 48 hours of her hospitalization the patient had rapid improvement. She actually had no stools once she was admitted to the hospital, therefore the Norvirus PCR test was never performed.  She also had little in the way of nausea and no vomiting. Abdominal cramping was slower to subside, therfore she stayed a second day in hospital.  She was stable for discharge on hospital day 3.   * Dehydration secondary to gastroenteritis.     Pt's weakness and dizzyness resolved with  IVF.  She was walking in her room, getting up to the bathroom without problems           within 24 hours of admission.   * Hypokalemia ,   She received oral potassium supplementation in addition to potassium added to IVF and Hypokalemia resolved    * Isolated hyperglycemia     Pt does not have hx diabetes or glucose intolerance. Hospital day 1 glucose was 132.  When the serum glucose was rechecked      it had normalized to 96.   *  Elevated AST/ALT.  This was trending to normal and was propably a  sequelae of her acute dehydration causing some relative          hepatic ischemia.  On prior CT scans, liver, bile duct, GB have been normal.   Discharge Vitals:  BP 126/80  Pulse 79  Temp(Src) 98.3 F (36.8 C) (Oral)  Resp 18  Ht 5\' 2"  (1.575 m)  Wt 185 lb (83.915 kg)  BMI 33.84 kg/m2  SpO2 95%   Discharge Labs:  Results for orders placed during the hospital encounter of 10/07/11 (from the past 24 hour(s))  CBC     Status: Abnormal   Collection Time   10/09/11  6:00 AM      Component Value Range   WBC 6.4  4.0 - 10.5 (K/uL)   RBC 3.85 (*) 3.87 - 5.11 (MIL/uL)   Hemoglobin 11.5 (*) 12.0 - 15.0 (g/dL)   HCT 46.9 (*) 62.9 - 46.0 (%)   MCV 88.3  78.0 - 100.0 (fL)   MCH 29.9  26.0 - 34.0 (pg)   MCHC 33.8  30.0 - 36.0 (g/dL)   RDW 52.8  41.3 - 24.4 (%)   Platelets 174  150 - 400 (K/uL)  COMPREHENSIVE METABOLIC PANEL     Status: Abnormal   Collection Time   10/09/11  6:00 AM      Component Value Range   Sodium 140  135 - 145 (mEq/L)   Potassium 3.5  3.5 - 5.1 (mEq/L)   Chloride 107  96 - 112 (mEq/L)   CO2 25  19 - 32 (mEq/L)   Glucose, Bld 96  70 - 99 (mg/dL)   BUN 8  6 - 23 (mg/dL)   Creatinine, Ser 0.10  0.50 - 1.10 (mg/dL)   Calcium 8.5  8.4 - 27.2 (mg/dL)   Total Protein 6.0  6.0 - 8.3 (g/dL)   Albumin 2.6 (*) 3.5 - 5.2 (g/dL)   AST 94 (*) 0 - 37 (U/L)   ALT 142 (*) 0 - 35 (U/L)   Alkaline Phosphatase 99  39 - 117 (U/L)   Total Bilirubin 0.4  0.3 - 1.2 (mg/dL)   GFR calc non Af Amer 80 (*) >90 (mL/min)   GFR calc Af Amer >90  >90 (mL/min)    Disposition and follow-up:   Patient was discharged in improved, healthy condition.   Follow-up Appointments: Discharge Orders    Future Orders Please Complete By Expires   Discharge instructions      Comments:   Contact Dr. Marina Goodell if you have any recurrent current issues with diarrhea, nausea or vomiting.  He does not need to see you back in the office unless you are having problems.      Discharge Medications: Medication  List  As of 10/09/2011 11:19 AM   STOP taking these medications         lisinopril 30 MG tablet  phentermine 15 MG capsule         TAKE these medications         ALPRAZolam 0.5 MG tablet   Commonly known as: XANAX   Take 0.5 mg by mouth at bedtime as needed. For nerves      aspirin 325 MG tablet   Take 325 mg by mouth daily.      dicyclomine 10 MG capsule   Commonly known as: BENTYL   Take 1 tab every 6 hours as needed for cramping and spasms.      esomeprazole 40 MG capsule   Commonly known as: NEXIUM   Take 40 mg by mouth daily before breakfast. For acid reflux        estradiol 0.5 MG tablet   Commonly known as: ESTRACE   Take 0.5 mg by mouth daily.      lisinopril-hydrochlorothiazide 10-12.5 MG per tablet   Commonly known as: PRINZIDE,ZESTORETIC   Take 1 tablet by mouth 2 (two) times daily.      ZOFRAN 4 MG tablet   Generic drug: ondansetron   Take 4 mg by mouth every 8 (eight) hours as needed. For nausea            Signed: Kasandra Knudsen  [phone] 10/09/2011, 11:19 AM    SEEN AND EXAMINED. READY FOR D/C Wilhemina Bonito. Eda Keys., M.D. [facility] Division of Gastroenterology

## 2011-11-17 ENCOUNTER — Other Ambulatory Visit: Payer: Self-pay | Admitting: Internal Medicine

## 2012-09-12 ENCOUNTER — Other Ambulatory Visit: Payer: Self-pay | Admitting: Obstetrics & Gynecology

## 2012-09-12 DIAGNOSIS — R928 Other abnormal and inconclusive findings on diagnostic imaging of breast: Secondary | ICD-10-CM

## 2012-09-20 ENCOUNTER — Encounter: Payer: Self-pay | Admitting: Internal Medicine

## 2012-09-20 ENCOUNTER — Ambulatory Visit (INDEPENDENT_AMBULATORY_CARE_PROVIDER_SITE_OTHER): Payer: Federal, State, Local not specified - PPO | Admitting: Internal Medicine

## 2012-09-20 VITALS — BP 124/94 | HR 88 | Ht 62.0 in | Wt 195.2 lb

## 2012-09-20 DIAGNOSIS — K222 Esophageal obstruction: Secondary | ICD-10-CM

## 2012-09-20 DIAGNOSIS — K219 Gastro-esophageal reflux disease without esophagitis: Secondary | ICD-10-CM

## 2012-09-20 DIAGNOSIS — R1013 Epigastric pain: Secondary | ICD-10-CM

## 2012-09-20 MED ORDER — ESOMEPRAZOLE MAGNESIUM 40 MG PO CPDR: 40.0000 mg | DELAYED_RELEASE_CAPSULE | Freq: Every day | ORAL | Status: DC

## 2012-09-20 NOTE — Progress Notes (Signed)
HISTORY OF PRESENT ILLNESS:  [name] is a 57 y.o. female with the below listed medical history who has been seen in this office previously for GERD, peptic stricture requiring esophageal dilation, and IBS. She was last evaluated in the office March 2013 when she was admitted with a viral gastroenteritis and dehydration. She presents today with complaints of epigastric pain or 2 months duration. She describes it as constant. 2 of 10 in severity. "Is like a hole". Cannot identify any exacerbating or relieving factors including change in position, time of day, meals, deep breathing, or stress. No nausea, vomiting, weight loss (actually weight gain), or bleeding. Somewhat less frequent bowels. Last complete colonoscopy January 2012 revealing diminutive polyps only. Last upper endoscopy at that same time with peptic stricture dilated with a 54 French Maloney dilator. She has not been compliant with PPI therapy. Only taking Nexium once or twice per month. No recurrent dysphagia. She is somewhat concerned that she will be traveling to [place] next month and wants to "make sure that everything is OK"  REVIEW OF SYSTEMS:  All non-GI ROS negative after review  Past Medical History  Diagnosis Date  . Esophageal stricture   . Duodenitis   . Gastritis   . Tubular adenoma polyp of rectum 2008  . Hypertension   . GERD (gastroesophageal reflux disease)   . IBS (irritable bowel syndrome)   . Ischemic colitis   . H/O hiatal hernia   . Lower GI bleeding     10/07/11 "I've had 3 episodes in the past year"  . History of esophageal stricture   . Colon polyps     Past Surgical History  Procedure Laterality Date  . Abdominal hysterectomy    . Back surgery  09/2008  . Hammer toe surgery      bilateral  . Tonsillectomy and adenoidectomy      "as a child"  . Esophageal dilation  08/2010    Social History IDELLE REIMANN  reports that she has never smoked. She has never used smokeless tobacco.  She reports that she does not drink alcohol or use illicit drugs.  family history includes Colon cancer in her brother; Heart attack in her brother; Kidney disease in her mother; and Stroke in her father.  No Known Allergies     PHYSICAL EXAMINATION: Vital signs: BP 124/94  Pulse 88  Ht 5\' 2"  (1.575 m)  Wt 195 lb 4 oz (88.565 kg)  BMI 35.7 kg/m2 General: Well-developed, well-nourished, no acute distress HEENT: Sclerae are anicteric, conjunctiva pink. Oral mucosa intact Lungs: Clear Heart: Regular Abdomen: soft, nontender, nondistended, no obvious ascites, no peritoneal signs, normal bowel sounds. No organomegaly. Extremities: No edema Psychiatric: alert and oriented x3. Cooperative     ASSESSMENT:  #1. Vague low-grade epigastric discomfort without alarm features. Possibly GERD. #2. History of adenomatous colon polyps and family history of colon cancer. Index exam 2008. Last examination 2012   PLAN:  #1. Reflux precautions #2. Take Nexium 40 mg daily. Samples given. Prescription submitted #3. Abdominal ultrasound to evaluate pain #4. Surveillance colonoscopy around 2017 #5. If ultrasound unrevealing and symptoms persist despite Nexium, return to the office for reevaluation and consideration of endoscopy. Otherwise, followup as needed.

## 2012-09-20 NOTE — Patient Instructions (Addendum)
You have been scheduled for an abdominal ultrasound at [facility] (1st floor of hospital) on 09-25-12 at 7:30am. Please arrive 15 minutes prior to your appointment for registration. Make certain not to have anything to eat or drink 6 hours prior to your appointment. Should you need to reschedule your appointment, please contact radiology at [phone]. This test typically takes about 30 minutes to perform.  We have sent the following medications to your pharmacy for you to pick up at your convenience:  Nexium.  I have also given you some samples to get started with

## 2012-09-25 ENCOUNTER — Ambulatory Visit (HOSPITAL_COMMUNITY)
Admission: RE | Admit: 2012-09-25 | Discharge: 2012-09-25 | Disposition: A | Discharge status: Home / Self Care | Payer: Federal, State, Local not specified - PPO | Source: Ambulatory Visit | Attending: Internal Medicine | Admitting: Internal Medicine

## 2012-09-25 DIAGNOSIS — K219 Gastro-esophageal reflux disease without esophagitis: Secondary | ICD-10-CM

## 2012-09-25 DIAGNOSIS — R1013 Epigastric pain: Secondary | ICD-10-CM

## 2012-09-25 DIAGNOSIS — K222 Esophageal obstruction: Secondary | ICD-10-CM

## 2012-09-26 ENCOUNTER — Ambulatory Visit
Admission: RE | Admit: 2012-09-26 | Discharge: 2012-09-26 | Disposition: A | Discharge status: Home / Self Care | Payer: Federal, State, Local not specified - PPO | Source: Ambulatory Visit | Attending: Obstetrics & Gynecology | Admitting: Obstetrics & Gynecology

## 2012-09-26 DIAGNOSIS — R928 Other abnormal and inconclusive findings on diagnostic imaging of breast: Secondary | ICD-10-CM

## 2012-09-27 ENCOUNTER — Telehealth: Payer: Self-pay | Admitting: Internal Medicine

## 2012-09-27 NOTE — Telephone Encounter (Signed)
LMOV  

## 2012-09-29 NOTE — Telephone Encounter (Signed)
LM on vm for patient to call me back

## 2012-10-03 ENCOUNTER — Telehealth: Payer: Self-pay

## 2012-10-03 NOTE — Telephone Encounter (Signed)
Patient was under the impression that she was supposed to take Nexium twice a day instead of once.  I reviewed her last office note and verified that Dr. Marina Goodell wants her to take it once a day, consistently.  I instructed her to call back if she tried this for a period of time and did not experience relief.  Patient agreed.

## 2012-10-11 ENCOUNTER — Telehealth: Payer: Self-pay | Admitting: Internal Medicine

## 2012-10-11 MED ORDER — DICYCLOMINE HCL 10 MG PO CAPS: ORAL_CAPSULE | ORAL | Status: DC

## 2012-10-11 NOTE — Telephone Encounter (Signed)
Pt aware and script sent to the pharmacy. 

## 2012-10-11 NOTE — Telephone Encounter (Signed)
Pt is [place] go [place] town [place]. Pt is requesting a prescription for bentyl, states she was given this in the past for ischemic colitis. Pt would like to have this on hand in case she had any problems. Is it ok to refill the pts old script for bentyl? Please advise.

## 2012-10-11 NOTE — Telephone Encounter (Signed)
Not a treatment for ischemic colitis, rather colon spasm.Marland KitchenMarland KitchenOk to refill

## 2012-12-10 ENCOUNTER — Encounter (HOSPITAL_BASED_OUTPATIENT_CLINIC_OR_DEPARTMENT_OTHER): Payer: Self-pay

## 2012-12-10 ENCOUNTER — Emergency Department (HOSPITAL_BASED_OUTPATIENT_CLINIC_OR_DEPARTMENT_OTHER): Payer: Federal, State, Local not specified - PPO

## 2012-12-10 ENCOUNTER — Inpatient Hospital Stay (HOSPITAL_BASED_OUTPATIENT_CLINIC_OR_DEPARTMENT_OTHER)
Admission: EM | Admit: 2012-12-10 | Discharge: 2012-12-16 | DRG: 121 | Disposition: A | Discharge status: Home / Self Care | Payer: Federal, State, Local not specified - PPO | Attending: Cardiology | Admitting: Cardiology

## 2012-12-10 DIAGNOSIS — I214 Non-ST elevation (NSTEMI) myocardial infarction: Principal | ICD-10-CM | POA: Diagnosis present

## 2012-12-10 DIAGNOSIS — K625 Hemorrhage of anus and rectum: Secondary | ICD-10-CM | POA: Diagnosis present

## 2012-12-10 DIAGNOSIS — I10 Essential (primary) hypertension: Secondary | ICD-10-CM | POA: Diagnosis present

## 2012-12-10 DIAGNOSIS — I2510 Atherosclerotic heart disease of native coronary artery without angina pectoris: Secondary | ICD-10-CM | POA: Diagnosis present

## 2012-12-10 DIAGNOSIS — E039 Hypothyroidism, unspecified: Secondary | ICD-10-CM | POA: Diagnosis present

## 2012-12-10 DIAGNOSIS — K589 Irritable bowel syndrome without diarrhea: Secondary | ICD-10-CM | POA: Diagnosis present

## 2012-12-10 DIAGNOSIS — E669 Obesity, unspecified: Secondary | ICD-10-CM | POA: Diagnosis present

## 2012-12-10 DIAGNOSIS — Z79899 Other long term (current) drug therapy: Secondary | ICD-10-CM

## 2012-12-10 DIAGNOSIS — I2542 Coronary artery dissection: Secondary | ICD-10-CM | POA: Diagnosis present

## 2012-12-10 DIAGNOSIS — K219 Gastro-esophageal reflux disease without esophagitis: Secondary | ICD-10-CM | POA: Diagnosis present

## 2012-12-10 DIAGNOSIS — I219 Acute myocardial infarction, unspecified: Secondary | ICD-10-CM

## 2012-12-10 DIAGNOSIS — R51 Headache: Secondary | ICD-10-CM | POA: Diagnosis not present

## 2012-12-10 HISTORY — DX: Acute myocardial infarction, unspecified: I21.9

## 2012-12-10 HISTORY — DX: Atherosclerotic heart disease of native coronary artery without angina pectoris: I25.10

## 2012-12-10 LAB — CBC
HCT: 37.8 % (ref 36.0–46.0)
MCH: 30.3 pg (ref 26.0–34.0)
MCHC: 34.7 g/dL (ref 30.0–36.0)
MCV: 87.3 fL (ref 78.0–100.0)
WBC: 7.2 10*3/uL (ref 4.0–10.5)

## 2012-12-10 LAB — BASIC METABOLIC PANEL
BUN: 18 mg/dL (ref 6–23)
Chloride: 102 mEq/L (ref 96–112)
GFR calc Af Amer: 81 mL/min — ABNORMAL LOW (ref >90)
Potassium: 3.8 mEq/L (ref 3.5–5.1)
Sodium: 138 mEq/L (ref 135–145)

## 2012-12-10 LAB — TROPONIN I
Troponin I: 4.95 ng/mL (ref <0.30)
Troponin I: 10.34 ng/mL (ref <0.30)
Troponin I: 10.32 ng/mL (ref <0.30)

## 2012-12-10 LAB — MRSA PCR SCREENING: MRSA by PCR: NEGATIVE

## 2012-12-10 LAB — PROTIME-INR: Prothrombin Time: 12.2 seconds (ref 11.6–15.2)

## 2012-12-10 LAB — APTT: aPTT: 31 seconds (ref 24–37)

## 2012-12-10 LAB — D-DIMER, QUANTITATIVE: D-Dimer, Quant: 0.85 ug/mL-FEU — ABNORMAL HIGH (ref 0.00–0.48)

## 2012-12-10 MED ORDER — ATORVASTATIN CALCIUM 40 MG PO TABS
40.0000 mg | ORAL_TABLET | Freq: Every day | ORAL | Status: DC
  Administered 2012-12-10 – 2012-12-15 (×6): 40 mg via ORAL
  Filled 2012-12-10 (×7): qty 1

## 2012-12-10 MED ORDER — DIAZEPAM 5 MG PO TABS
5.0000 mg | ORAL_TABLET | ORAL | Status: AC
  Administered 2012-12-11: 5 mg via ORAL
  Filled 2012-12-10: qty 1

## 2012-12-10 MED ORDER — NITROGLYCERIN IN D5W 200-5 MCG/ML-% IV SOLN
5.0000 ug/min | INTRAVENOUS | Status: AC
  Administered 2012-12-12: 5 ug/min via INTRAVENOUS
  Filled 2012-12-10: qty 250

## 2012-12-10 MED ORDER — ZOLPIDEM TARTRATE 5 MG PO TABS: 5.0000 mg | ORAL_TABLET | Freq: Every evening | ORAL | Status: DC | PRN

## 2012-12-10 MED ORDER — SODIUM CHLORIDE 0.9 % IV SOLN: 250.0000 mL | INTRAVENOUS | Status: DC | PRN

## 2012-12-10 MED ORDER — SODIUM CHLORIDE 0.9 % IV SOLN
INTRAVENOUS | Status: DC
  Administered 2012-12-11: 02:00:00 via INTRAVENOUS

## 2012-12-10 MED ORDER — ALPRAZOLAM 0.25 MG PO TABS
0.2500 mg | ORAL_TABLET | Freq: Three times a day (TID) | ORAL | Status: DC | PRN
  Administered 2012-12-14: 0.25 mg via ORAL
  Filled 2012-12-10: qty 1

## 2012-12-10 MED ORDER — LIOTHYRONINE SODIUM 25 MCG PO TABS
50.0000 ug | ORAL_TABLET | Freq: Every day | ORAL | Status: DC
  Administered 2012-12-13 – 2012-12-16 (×4): 50 ug via ORAL
  Filled 2012-12-10 (×7): qty 2

## 2012-12-10 MED ORDER — SODIUM CHLORIDE 0.9 % IJ SOLN: 3.0000 mL | INTRAMUSCULAR | Status: DC | PRN

## 2012-12-10 MED ORDER — SODIUM CHLORIDE 0.9 % IJ SOLN: 3.0000 mL | Freq: Two times a day (BID) | INTRAMUSCULAR | Status: DC

## 2012-12-10 MED ORDER — EPTIFIBATIDE 75 MG/100ML IV SOLN
2.0000 ug/kg/min | INTRAVENOUS | Status: DC
  Administered 2012-12-10 – 2012-12-11 (×4): 2 ug/kg/min via INTRAVENOUS
  Filled 2012-12-10 (×7): qty 100

## 2012-12-10 MED ORDER — HEPARIN (PORCINE) IN NACL 100-0.45 UNIT/ML-% IJ SOLN
1000.0000 [IU]/h | INTRAMUSCULAR | Status: DC
  Administered 2012-12-11: 1000 [IU]/h via INTRAVENOUS
  Filled 2012-12-10: qty 250

## 2012-12-10 MED ORDER — NITROGLYCERIN 0.4 MG SL SUBL
0.4000 mg | SUBLINGUAL_TABLET | SUBLINGUAL | Status: DC | PRN
  Administered 2012-12-10: 0.4 mg via SUBLINGUAL
  Filled 2012-12-10: qty 25

## 2012-12-10 MED ORDER — HEPARIN (PORCINE) IN NACL 100-0.45 UNIT/ML-% IJ SOLN
INTRAMUSCULAR | Status: AC
  Administered 2012-12-10: 1000 [IU]/h via INTRAVENOUS
  Filled 2012-12-10: qty 250

## 2012-12-10 MED ORDER — SODIUM CHLORIDE 0.9 % IV BOLUS (SEPSIS)
500.0000 mL | Freq: Once | INTRAVENOUS | Status: AC
  Administered 2012-12-10: 500 mL via INTRAVENOUS

## 2012-12-10 MED ORDER — ACETAMINOPHEN 325 MG PO TABS
650.0000 mg | ORAL_TABLET | ORAL | Status: DC | PRN
  Administered 2012-12-13: 650 mg via ORAL
  Filled 2012-12-10: qty 2

## 2012-12-10 MED ORDER — ESTRADIOL 1 MG PO TABS
0.5000 mg | ORAL_TABLET | Freq: Every day | ORAL | Status: DC
  Administered 2012-12-10 – 2012-12-16 (×7): 0.5 mg via ORAL
  Filled 2012-12-10 (×8): qty 0.5

## 2012-12-10 MED ORDER — HEPARIN BOLUS VIA INFUSION
4000.0000 [IU] | Freq: Once | INTRAVENOUS | Status: AC
  Administered 2012-12-10: 4000 [IU] via INTRAVENOUS

## 2012-12-10 MED ORDER — ONDANSETRON HCL 4 MG/2ML IJ SOLN: 4.0000 mg | Freq: Four times a day (QID) | INTRAMUSCULAR | Status: DC | PRN

## 2012-12-10 MED ORDER — SODIUM CHLORIDE 0.9 % IV SOLN
Freq: Once | INTRAVENOUS | Status: AC
  Administered 2012-12-10: 12:00:00 via INTRAVENOUS

## 2012-12-10 MED ORDER — ASPIRIN 325 MG PO TABS
325.0000 mg | ORAL_TABLET | Freq: Every day | ORAL | Status: DC
  Administered 2012-12-11 – 2012-12-16 (×5): 325 mg via ORAL
  Filled 2012-12-10 (×6): qty 1

## 2012-12-10 MED ORDER — SODIUM CHLORIDE 0.9 % IV SOLN
Freq: Once | INTRAVENOUS | Status: AC
  Administered 2012-12-10: 11:00:00 via INTRAVENOUS

## 2012-12-10 MED ORDER — METOPROLOL TARTRATE 50 MG PO TABS
25.0000 mg | ORAL_TABLET | Freq: Once | ORAL | Status: AC
  Administered 2012-12-10: 25 mg via ORAL
  Filled 2012-12-10: qty 1

## 2012-12-10 MED ORDER — DICYCLOMINE HCL 10 MG PO CAPS
10.0000 mg | ORAL_CAPSULE | Freq: Three times a day (TID) | ORAL | Status: DC | PRN
  Filled 2012-12-10: qty 1

## 2012-12-10 MED ORDER — NITROGLYCERIN IN D5W 200-5 MCG/ML-% IV SOLN
INTRAVENOUS | Status: AC
  Administered 2012-12-10: 5 ug/min via INTRAVENOUS
  Filled 2012-12-10: qty 250

## 2012-12-10 MED ORDER — ONDANSETRON HCL 4 MG/2ML IJ SOLN
4.0000 mg | Freq: Once | INTRAMUSCULAR | Status: AC
  Administered 2012-12-10: 4 mg via INTRAVENOUS
  Filled 2012-12-10: qty 2

## 2012-12-10 MED ORDER — EPTIFIBATIDE BOLUS VIA INFUSION
180.0000 ug/kg | Freq: Once | INTRAVENOUS | Status: AC
  Administered 2012-12-10: 16500 ug via INTRAVENOUS
  Filled 2012-12-10: qty 22

## 2012-12-10 MED ORDER — MORPHINE SULFATE 4 MG/ML IJ SOLN
4.0000 mg | Freq: Once | INTRAMUSCULAR | Status: AC
  Administered 2012-12-10: 4 mg via INTRAVENOUS
  Filled 2012-12-10: qty 1

## 2012-12-10 NOTE — Progress Notes (Signed)
ANTICOAGULATION CONSULT NOTE - Initial Consult  Pharmacy Consult for heparin/integrillin Indication: NSTEMI  No Known Allergies  Patient Measurements: Height: 5\' 2"  (157.5 cm) Weight: 202 lb 2.6 oz (91.7 kg) IBW/kg (Calculated) : 50.1  Vital Signs: Temp: 98.1 F (36.7 C) (05/11 0817) Temp src: Oral (05/11 0817) BP: 100/63 mmHg (05/11 1515) Pulse Rate: 58 (05/11 1520)  Labs:  Recent Labs  12/10/12 0827  HGB 13.1  HCT 37.8  PLT 249  APTT 31  LABPROT 12.2  INR 0.91  CREATININE 0.90  TROPONINI 4.95*    Estimated Creatinine Clearance: 73.5 ml/min (by C-G formula based on Cr of 0.9).   Medical History: Past Medical History  Diagnosis Date  . Esophageal stricture   . Duodenitis   . Gastritis   . Tubular adenoma polyp of rectum 2008  . Hypertension   . GERD (gastroesophageal reflux disease)   . IBS (irritable bowel syndrome)   . Ischemic colitis   . H/O hiatal hernia   . Lower GI bleeding     10/07/11 "I've had 3 episodes in the past year"  . History of esophageal stricture   . Colon polyps     Medications:  Prescriptions prior to admission  Medication Sig Dispense Refill  . ALPRAZolam (XANAX) 0.5 MG tablet Take 0.5 mg by mouth at bedtime as needed. For nerves      . aspirin 325 MG tablet Take 325 mg by mouth daily.        Marland Kitchen dicyclomine (BENTYL) 10 MG capsule Take 1 tab every 6 hours as needed for cramping and spasms.  90 capsule  0  . esomeprazole (NEXIUM) 40 MG capsule Take 1 capsule (40 mg total) by mouth daily before breakfast.  30 capsule  6  . estradiol (ESTRACE) 0.5 MG tablet Take 0.5 mg by mouth daily.        Marland Kitchen lisinopril-hydrochlorothiazide (PRINZIDE,ZESTORETIC) 10-12.5 MG per tablet Take 1 tablet by mouth 2 (two) times daily.      . CYTOMEL 50 MCG tablet Take 50 mcg by mouth daily.       Marland Kitchen levothyroxine (SYNTHROID, LEVOTHROID) 50 MCG tablet Take 50 mcg by mouth daily.        Scheduled:  . [COMPLETED] sodium chloride   Intravenous Once  .  [COMPLETED] sodium chloride   Intravenous Once  . [START ON 12/11/2012] aspirin  325 mg Oral Daily  . atorvastatin  40 mg Oral q1800  . [START ON 12/11/2012] diazepam  5 mg Oral On Call  . eptifibatide  180 mcg/kg Intravenous Once  . estradiol  0.5 mg Oral Daily  . [COMPLETED] heparin  4,000 Units Intravenous Once  . liothyronine  50 mcg Oral Daily  . [COMPLETED] metoprolol tartrate  25 mg Oral Once  . [COMPLETED]  morphine injection  4 mg Intravenous Once  . [COMPLETED] ondansetron  4 mg Intravenous Once  . [COMPLETED] sodium chloride  500 mL Intravenous Once  . [COMPLETED] sodium chloride  500 mL Intravenous Once  . sodium chloride  3 mL Intravenous Q12H    Assessment: 57 y.o female admitted with NSTEMI, positive troponin 4.95 to be started on heparin and integrillin while awaiting cath on Monday. H/H wnl, PLT 249. Noted history of rectal bleed in 2013- (per patient d/t ischemic colitis) will monitor closely.  Goal of Therapy:  Heparin level 0.3-0.5 (on 2b/3a inhibitor) Monitor platelets by anticoagulation protocol: Yes   Plan:  1. Continue heparin gtt at current rate 1000 units/hour (received 400 units bolus this  AM) 2. Integrillin 180 mcg/kg bolus x1 then 2 mcg/kg  infusion 3. F/u 8 hour heparin level 4. Daily heparin level and CBC   Bola A. Wandra Feinstein D Clinical Pharmacist Pager:[phone] Phone [phone] 12/10/2012 3:56 PM

## 2012-12-10 NOTE — ED Notes (Signed)
Pt states that she has severe L arm pain x2 days, but this morning at 0400 she noticed onset of L chest pain.  Pt states that she has dizziness/lightheadedness, and nausea.  Pt states pain does get worse with movement, states that she has not really exerted herself so doesn't know if makes it worse, but reports no incr sob or weakness with ambulation.

## 2012-12-10 NOTE — ED Notes (Signed)
BP dropping, pt becoming pale and slightly nauseated.  500 cc bolus initiated and pt placed in trendelenburg.

## 2012-12-10 NOTE — Progress Notes (Signed)
CRITICAL VALUE ALERT  Critical value received:  Trop 10.35   Date of notification:  12/10/2012   Time of notification:  4:35 PM   Critical value read back:yes  Nurse who received alert:  Desiree Lucy   MD notified (1st page):  Marykay Lex, MD  Time of first page:  4:36 PM   MD notified (2nd page):   Time of second page:  Responding MD:  Marykay Lex, MD   Time MD responded:  4:49 PM

## 2012-12-10 NOTE — H&P (Signed)
Patient ID: [name] MRN: [id number], DOB/AGE: 57-23-1957   Admit date: 12/10/2012   Primary Physician: [facility] Primary Cardiologist: Dr Allyson Sabal  HPI:   57 y/o overweight female with a history of HTN, seen by Dr Allyson Sabal in the past and reports negative stress tests remotely (no records available).  She was in her usual state of health till two days prior when she began noticing Lt arm pain. She describes intermittent Lt arm pain that actually was worse when she lay down at night.  She denies any SOB, nausea, or diaphoresis. She did take ASA with some relief. Last night she had worse pain, now with Lt chest pain and [facility]. Her Troponin is positive 4.95. EKG shows no acute changes. She is transferred now for further evaluation. Currently she has minimal chest pain. She did have transient hypotension that responded to IVF.   Problem List: Past Medical History  Diagnosis Date  . Esophageal stricture   . Duodenitis   . Gastritis   . Tubular adenoma polyp of rectum 2008  . Hypertension   . GERD (gastroesophageal reflux disease)   . IBS (irritable bowel syndrome)   . Ischemic colitis   . H/O hiatal hernia   . Lower GI bleeding     10/07/11 "I've had 3 episodes in the past year"  . History of esophageal stricture   . Colon polyps     Past Surgical History  Procedure Laterality Date  . Abdominal hysterectomy    . Back surgery  09/2008  . Hammer toe surgery      bilateral  . Tonsillectomy and adenoidectomy      "as a child"  . Esophageal dilation  08/2010     Allergies: No Known Allergies   Home Medications Current Facility-Administered Medications  Medication Dose Route Frequency Provider Last Rate Last Dose  . heparin ADULT infusion 100 units/mL (25000 units/250 mL)  1,000 Units/hr Intravenous Continuous Ethelda Chick, MD 10 mL/hr at 12/10/12 0940 1,000 Units/hr at 12/10/12 0940  . nitroGLYCERIN (NITROSTAT) SL tablet 0.4 mg  0.4 mg Sublingual  Q5 min PRN Ethelda Chick, MD   0.4 mg at 12/10/12 0826  . nitroGLYCERIN 0.2 mg/mL in dextrose 5 % infusion  5 mcg/min Intravenous Titrated Ethelda Chick, MD 6 mL/hr at 12/10/12 1223 20 mcg/min at 12/10/12 1223     Family History  Problem Relation Age of Onset  . Colon cancer Brother   . Kidney disease Mother   . Stroke Father   . Heart attack Brother      History   Social History  . Marital Status: Single    Spouse Name: N/A    Number of Children: 2  . Years of Education: N/A   Occupational History  . Drug Programmer, [employer]   Social History Main Topics  . Smoking status: Never Smoker   . Smokeless tobacco: Never Used  . Alcohol Use: No  . Drug Use: No  . Sexually Active: Not Currently   Other Topics Concern  . Not on file   Social History Narrative   Daily caffeine use.     Review of Systems: General: negative for chills, fever, night sweats or weight changes.  Cardiovascular: negative for chest pain, dyspnea on exertion, edema, orthopnea, palpitations, paroxysmal nocturnal dyspnea or shortness of breath Dermatological: negative for rash Respiratory: negative for cough or wheezing Urologic: negative for hematuria Abdominal: negative for nausea, vomiting, diarrhea,  bright red blood per rectum, melena, or hematemesis Neurologic: negative for visual changes, syncope, or dizziness All other systems reviewed and are otherwise negative except as noted above.  Physical Exam: Blood pressure 103/66, pulse 53, temperature 98.1 F (36.7 C), temperature source Oral, resp. rate 15, height 5\' 2"  (1.575 m), weight 90.719 kg (200 lb), SpO2 98.00%.  General appearance: alert, cooperative and no distress Neck: no carotid bruit and no JVD Lungs: clear to auscultation bilaterally Heart: regular rate and rhythm Abdomen: obese, non tender Extremities: extremities normal, atraumatic, no cyanosis or edema Pulses: 2+ and symmetric Skin: Skin color, texture,  turgor normal. No rashes or lesions Neurologic: Grossly normal    Labs:   Results for orders placed during the hospital encounter of 12/10/12 (from the past 24 hour(s))  CBC     Status: None   Collection Time    12/10/12  8:27 AM      Result Value Range   WBC 7.2  4.0 - 10.5 K/uL   RBC 4.33  3.87 - 5.11 MIL/uL   Hemoglobin 13.1  12.0 - 15.0 g/dL   HCT 16.1  09.6 - 04.5 %   MCV 87.3  78.0 - 100.0 fL   MCH 30.3  26.0 - 34.0 pg   MCHC 34.7  30.0 - 36.0 g/dL   RDW 40.9  81.1 - 91.4 %   Platelets 249  150 - 400 K/uL  BASIC METABOLIC PANEL     Status: Abnormal   Collection Time    12/10/12  8:27 AM      Result Value Range   Sodium 138  135 - 145 mEq/L   Potassium 3.8  3.5 - 5.1 mEq/L   Chloride 102  96 - 112 mEq/L   CO2 24  19 - 32 mEq/L   Glucose, Bld 104 (*) 70 - 99 mg/dL   BUN 18  6 - 23 mg/dL   Creatinine, Ser 7.82  0.50 - 1.10 mg/dL   Calcium 9.7  8.4 - 95.6 mg/dL   GFR calc non Af Amer 70 (*) >90 mL/min   GFR calc Af Amer 81 (*) >90 mL/min  TROPONIN I     Status: Abnormal   Collection Time    12/10/12  8:27 AM      Result Value Range   Troponin I 4.95 (*) <0.30 ng/mL  D-DIMER, QUANTITATIVE     Status: Abnormal   Collection Time    12/10/12  8:27 AM      Result Value Range   D-Dimer, Quant 0.85 (*) 0.00 - 0.48 ug/mL-FEU  APTT     Status: None   Collection Time    12/10/12  8:27 AM      Result Value Range   aPTT 31  24 - 37 seconds  PROTIME-INR     Status: None   Collection Time    12/10/12  8:27 AM      Result Value Range   Prothrombin Time 12.2  11.6 - 15.2 seconds   INR 0.91  0.00 - 1.49     Radiology/Studies: Dg Chest 2 View  12/10/2012  *RADIOLOGY REPORT*  Clinical Data: Chest pain and left arm pain.  CHEST - 2 VIEW  Comparison: 10/02/2008  Findings: There is mild chronic elevation of the right hemidiaphragm.  Lungs show no evidence of infiltrate, edema, nodule or pleural fluid.  Heart size and mediastinal contours within normal limits.  Bony thorax is  unremarkable.  IMPRESSION: No active disease.   Original Report  Authenticated By: Irish Lack, M.D.     EKG:NSR, no acute changes  ASSESSMENT AND PLAN:  Principal Problem:   NSTEMI (non-ST elevated myocardial infarction) Active Problems:   HTN (hypertension)   GERD   Irritable bowel syndrome   RECTAL BLEEDING Jan 2013   Unspecified hypothyroidism   Obesity, unspecified   PLAN:   Heparin, NTG, consider Integrelin. Avoid beta blocker as her HR is in the 50s. Cath in am unless follow up EKG shows ST elevation. She will need CTA chest if her cath in NL.   Deland Pretty, [name] 12/10/2012, 1:37 PM  I have seen and evaluated the patient this PM along with Corine Shelter, PA; Nada Boozer, NP, Boyce Medici, or [place], [place]. I agree with Luke's findings, examination as well as impression & recommendations.  57 y/o mildly obese woman with HTN, admitted [facility] after presenting with NSTEMI.  Onset of SSCP early this AM.  Had noticed intermittent short-lived Sx for the past ~2 days, then got concerned when the discomfort lasted several minutes -- actually lasted > .  In [facility] received SL NTg - BP dropped some, but Sx improved.  Finally resolved after ASA & Heparin initiated.  Currently CP free, but did have some Sx during transport.  She has Anteroseptal TWI, + Troponin & prolonged pain, on home ASA -- TIMI Risk Score is high enough to consider Early Invasive Strategy -- NTG & Heparin gtt & will use Integrelin due to prolonged pain after initial Rx.  Would opt for Cardiac Catheterization tomorrow +/- PCI due to her overall risk.  Low dose BB as tolerated.  Already on ACE-I.  Add Statin. PPI.  Performing MD:  Marykay Lex, M.D., M.S.  Procedure:  Left Heart Catheterization with Native Coronary Angiography +/- Percutaneous Coronary Intervention (Balloon/Stent)  The procedure with Risks/Benefits/Alternatives and Indications was reviewed with the patient and  adult children.  All questions were answered.    Risks / Complications include, but not limited to: Death, MI, CVA/TIA, VF/VT (with defibrillation), Bradycardia (need for temporary pacer placement), contrast induced nephropathy, bleeding / bruising / hematoma / pseudoaneurysm, vascular or coronary injury (with possible emergent CT or Vascular Surgery), adverse medication reactions, infection.    The patient (and family) voice understanding and agree to proceed.     Marykay Lex, M.D., M.S. [facility] [address] [place], [place]  [address]  [phone] Pager # [phone] 12/10/2012 4:35 PM

## 2012-12-10 NOTE — ED Notes (Signed)
MD at bedside. 

## 2012-12-10 NOTE — ED Provider Notes (Signed)
History     CSN: [id number]  Arrival date & time 12/10/12  0808   First MD Initiated Contact with Patient 12/10/12 0825      Chief Complaint  Patient presents with  . Chest Pain    (Consider location/radiation/quality/duration/timing/severity/associated sxs/prior treatment) HPI Pt presenting with left sided chest pain.  She states that over the past 2 days she had left arm pain/heaviness.  This morning began to have left sided chest pain which has been constant since it started.  Has had some nausea, no vomiting.  No sob, no diaphoresis.  Pt cannot say whether pain is associated with exertion.  No arm swelling, some increased pain with movement of arm.  No leg swelling.  Did have recent travel to [place] approx 6 weeks ago.  Pain described as aching.  No fever or cough.  Took 325mg  aspirin just prior to coming to the ED. States pain on arrival is 8/10. No hx of MI, has hx of HTN.  States she has had stress tests in the past through Dr Allyson Sabal that have been negative.  There are no other associated systemic symptoms, there are no other alleviating or modifying factors.   Past Medical History  Diagnosis Date  . Esophageal stricture   . Duodenitis   . Gastritis   . Tubular adenoma polyp of rectum 2008  . Hypertension   . GERD (gastroesophageal reflux disease)   . IBS (irritable bowel syndrome)   . Ischemic colitis   . H/O hiatal hernia   . Lower GI bleeding     10/07/11 "I've had 3 episodes in the past year"  . History of esophageal stricture   . Colon polyps     Past Surgical History  Procedure Laterality Date  . Abdominal hysterectomy    . Back surgery  09/2008  . Hammer toe surgery      bilateral  . Tonsillectomy and adenoidectomy      "as a child"  . Esophageal dilation  08/2010    Family History  Problem Relation Age of Onset  . Colon cancer Brother   . Kidney disease Mother   . Stroke Father   . Heart attack Brother     History  Substance Use Topics  .  Smoking status: Never Smoker   . Smokeless tobacco: Never Used  . Alcohol Use: No    OB History   Grav Para Term Preterm Abortions TAB SAB Ect Mult Living                  Review of Systems ROS reviewed and all otherwise negative except for mentioned in HPI  Allergies  Review of patient's allergies indicates no known allergies.  Home Medications   No current outpatient prescriptions on file.  BP 100/63  Pulse 58  Temp(Src) 97.7 F (36.5 C) (Oral)  Resp 19  Ht 5\' 2"  (1.575 m)  Wt 202 lb 2.6 oz (91.7 kg)  BMI 36.97 kg/m2  SpO2 95% Vitals reviewed Physical Exam Physical Examination: General appearance - alert, concerned appearing, and in no distress Mental status - alert, oriented to person, place, and time Eyes - no conjunctival injection, no scleral icterus Mouth - mucous membranes moist, pharynx normal without lesions Chest - clear to auscultation, no wheezes, rales or rhonchi, symmetric air entry, some ttp over left anterior chest wall but this is not the same pain Heart - normal rate, regular rhythm, normal S1, S2, no murmurs, rubs, clicks or gallops Abdomen - soft, nontender, nondistended,  no masses or organomegaly MS- no pain with ROM of left shoulder Extremities - peripheral pulses normal, no pedal edema, no clubbing or cyanosis Skin - normal coloration and turgor, no rashes  ED Course  Procedures (including critical care time)   Date: 12/10/2012  Rate: 69  Rhythm: normal sinus rhythm  QRS Axis: normal  Intervals: normal  ST/T Wave abnormalities: nonspecific T wave changes, isolated t wave inversion in lead III  Conduction Disutrbances:none  Narrative Interpretation: poor r wave progression  Old EKG Reviewed: none available  9:21 AM consult to cardiology placed, troponin positive, pain remains 6/10 down from 8/10 after nitro.  Ordered nitro drip, heparin, morphine zofran.   9:41 AM d/w Dr. Herbie Baltimore, cardiology, pt to go to stepdown at [facility]  All results  and plan d/w patient at the bedside.  She is tearful and calling her children.  I have answered her question and provided reassurance to the best of my ability.   CRITICAL CARE Performed by: Ethelda Chick Total critical care time: 50 Critical care time was exclusive of separately billable procedures and treating other patients. Critical care was necessary to treat or prevent imminent or life-threatening deterioration. Critical care was time spent personally by me on the following activities: development of treatment plan with patient and/or surrogate as well as nursing, discussions with consultants, evaluation of patient's response to treatment, examination of patient, obtaining history from patient or surrogate, ordering and performing treatments and interventions, ordering and review of laboratory studies, ordering and review of radiographic studies, pulse oximetry and re-evaluation of patient's condition.   Labs Reviewed  BASIC METABOLIC PANEL - Abnormal; Notable for the following:    Glucose, Bld 104 (*)    GFR calc non Af Amer 70 (*)    GFR calc Af Amer 81 (*)    All other components within normal limits  TROPONIN I - Abnormal; Notable for the following:    Troponin I 4.95 (*)    All other components within normal limits  D-DIMER, QUANTITATIVE - Abnormal; Notable for the following:    D-Dimer, Quant 0.85 (*)    All other components within normal limits  TROPONIN I - Abnormal; Notable for the following:    Troponin I 10.34 (*)    All other components within normal limits  MRSA PCR SCREENING  CBC  APTT  PROTIME-INR  URINALYSIS, ROUTINE W REFLEX MICROSCOPIC  TROPONIN I   Dg Chest 2 View  12/10/2012  *RADIOLOGY REPORT*  Clinical Data: Chest pain and left arm pain.  CHEST - 2 VIEW  Comparison: 10/02/2008  Findings: There is mild chronic elevation of the right hemidiaphragm.  Lungs show no evidence of infiltrate, edema, nodule or pleural fluid.  Heart size and mediastinal contours  within normal limits.  Bony thorax is unremarkable.  IMPRESSION: No active disease.   Original Report Authenticated By: Irish Lack, M.D.      1. NSTEMI (non-ST elevated myocardial infarction)       MDM  Pt presenting with left arm pain, then left chest pain.  EKG without any acute changes-some t wave flattening in inferior leads, no STEMI.  After SL nitro pain from 8 to 6/10, some decrease in BP which responded to IV bolus.  Troponin positive at 4.95.  Pt started on nitro drip and heparin gtt.  D/w Dr. Herbie Baltimore, cardiology, he recommends po metoprolol 25mg .  Pt observed in ED while awaiting stepdown bed at [facility].  Nitro drip titrated as able- mild hypotension which  responded to fluid boluses, pain down to 1/10 on drip.  HR in 40s and 50s with nitro and beta blocker.  Pt remained awake and alert.  Pt in gaurded condition upon transport via [facility].          Ethelda Chick, MD 12/10/12 [phone]

## 2012-12-10 NOTE — ED Notes (Addendum)
Pt BP decreased, pt symptomatic, Dr. Karma Ganja notified.  Decreased Nitroglycerin to 20 mics and increased fluid to 150 cc per hour.

## 2012-12-10 NOTE — Progress Notes (Addendum)
I was notified that the patient presented to [facility] ED with SSCP & initial Troponin indicates NSTEMI>  We have accepted the patient for admission to [facility] unit.  ASA 324 mg given. SL NTG with some relief of pain, but BP drop to ~105 systolic.  -- starting gtt. IV Heparin bolus & gtt being started.  With prolonged Sx after initial Rx -- will start Intgrilin gtt Recommended PO BB dose (to avoid another BP drop).  We will be standing by for her arrival.  Marykay Lex, M.D., M.S. [facility] [address] [place], [place]  [address]  [phone] Pager # [phone] 12/10/2012 9:52 AM

## 2012-12-11 ENCOUNTER — Encounter (HOSPITAL_COMMUNITY)
Admission: EM | Disposition: A | Discharge status: Home / Self Care | Payer: Self-pay | Source: Home / Self Care | Attending: Cardiology

## 2012-12-11 HISTORY — PX: LEFT HEART CATHETERIZATION WITH CORONARY ANGIOGRAM: SHX5451

## 2012-12-11 LAB — URINALYSIS, ROUTINE W REFLEX MICROSCOPIC
Bilirubin Urine: NEGATIVE
Glucose, UA: NEGATIVE mg/dL
Ketones, ur: NEGATIVE mg/dL
Leukocytes, UA: NEGATIVE
Nitrite: NEGATIVE
Protein, ur: NEGATIVE mg/dL
Specific Gravity, Urine: 1.01 (ref 1.005–1.030)
Urobilinogen, UA: 0.2 mg/dL (ref 0.0–1.0)
pH: 5.5 (ref 5.0–8.0)

## 2012-12-11 LAB — COMPREHENSIVE METABOLIC PANEL
ALT: 13 U/L (ref 0–35)
AST: 48 U/L — ABNORMAL HIGH (ref 0–37)
Albumin: 2.6 g/dL — ABNORMAL LOW (ref 3.5–5.2)
Alkaline Phosphatase: 85 U/L (ref 39–117)
BUN: 10 mg/dL (ref 6–23)
CO2: 25 mEq/L (ref 19–32)
Calcium: 8.5 mg/dL (ref 8.4–10.5)
Chloride: 106 mEq/L (ref 96–112)
Creatinine, Ser: 0.8 mg/dL (ref 0.50–1.10)
GFR calc Af Amer: >90 mL/min (ref >90)
GFR calc non Af Amer: 81 mL/min — ABNORMAL LOW (ref >90)
Glucose, Bld: 113 mg/dL — ABNORMAL HIGH (ref 70–99)
Potassium: 3.6 mEq/L (ref 3.5–5.1)
Sodium: 139 mEq/L (ref 135–145)
Total Bilirubin: 0.4 mg/dL (ref 0.3–1.2)
Total Protein: 6.1 g/dL (ref 6.0–8.3)

## 2012-12-11 LAB — LIPID PANEL
Cholesterol: 187 mg/dL (ref 0–200)
HDL: 52 mg/dL (ref >39)
LDL Cholesterol: 103 mg/dL — ABNORMAL HIGH (ref 0–99)
Total CHOL/HDL Ratio: 3.6 RATIO
Triglycerides: 159 mg/dL — ABNORMAL HIGH (ref <150)
VLDL: 32 mg/dL (ref 0–40)

## 2012-12-11 LAB — URINE MICROSCOPIC-ADD ON

## 2012-12-11 LAB — PROTIME-INR
INR: 1.03 (ref 0.00–1.49)
Prothrombin Time: 13.4 seconds (ref 11.6–15.2)

## 2012-12-11 LAB — TSH: TSH: 1.647 u[IU]/mL (ref 0.350–4.500)

## 2012-12-11 SURGERY — LEFT HEART CATHETERIZATION WITH CORONARY ANGIOGRAM
Anesthesia: LOCAL

## 2012-12-11 MED ORDER — HEPARIN SODIUM (PORCINE) 1000 UNIT/ML IJ SOLN
INTRAMUSCULAR | Status: AC
  Filled 2012-12-11: qty 1

## 2012-12-11 MED ORDER — SODIUM CHLORIDE 0.9 % IJ SOLN: 3.0000 mL | INTRAMUSCULAR | Status: DC | PRN

## 2012-12-11 MED ORDER — SODIUM CHLORIDE 0.9 % IV SOLN
250.0000 mL | INTRAVENOUS | Status: DC | PRN
  Administered 2012-12-11: 250 mL via INTRAVENOUS

## 2012-12-11 MED ORDER — LIDOCAINE HCL (PF) 1 % IJ SOLN
INTRAMUSCULAR | Status: AC
  Filled 2012-12-11: qty 30

## 2012-12-11 MED ORDER — VERAPAMIL HCL 2.5 MG/ML IV SOLN
INTRAVENOUS | Status: AC
  Filled 2012-12-11: qty 2

## 2012-12-11 MED ORDER — MIDAZOLAM HCL 2 MG/2ML IJ SOLN
INTRAMUSCULAR | Status: AC
  Filled 2012-12-11: qty 2

## 2012-12-11 MED ORDER — SODIUM CHLORIDE 0.9 % IV SOLN: 1.0000 mL/kg/h | INTRAVENOUS | Status: AC

## 2012-12-11 MED ORDER — MORPHINE SULFATE 2 MG/ML IJ SOLN
1.0000 mg | INTRAMUSCULAR | Status: DC | PRN
  Administered 2012-12-11 – 2012-12-13 (×3): 1 mg via INTRAVENOUS
  Filled 2012-12-11 (×2): qty 1

## 2012-12-11 MED ORDER — PANTOPRAZOLE SODIUM 40 MG PO TBEC
40.0000 mg | DELAYED_RELEASE_TABLET | Freq: Every day | ORAL | Status: DC
  Administered 2012-12-11 – 2012-12-16 (×6): 40 mg via ORAL
  Filled 2012-12-11 (×6): qty 1

## 2012-12-11 MED ORDER — HEPARIN (PORCINE) IN NACL 2-0.9 UNIT/ML-% IJ SOLN
INTRAMUSCULAR | Status: AC
  Filled 2012-12-11: qty 1000

## 2012-12-11 MED ORDER — SODIUM CHLORIDE 0.9 % IJ SOLN
3.0000 mL | Freq: Two times a day (BID) | INTRAMUSCULAR | Status: DC
  Administered 2012-12-11 – 2012-12-14 (×7): 3 mL via INTRAVENOUS

## 2012-12-11 MED ORDER — HEPARIN (PORCINE) IN NACL 100-0.45 UNIT/ML-% IJ SOLN
1400.0000 [IU]/h | INTRAMUSCULAR | Status: DC
  Administered 2012-12-12: 1400 [IU]/h via INTRAVENOUS
  Filled 2012-12-11 (×2): qty 250

## 2012-12-11 MED ORDER — FENTANYL CITRATE 0.05 MG/ML IJ SOLN
INTRAMUSCULAR | Status: AC
  Filled 2012-12-11: qty 2

## 2012-12-11 MED ORDER — NITROGLYCERIN 1 MG/10 ML FOR IR/CATH LAB
INTRA_ARTERIAL | Status: AC
  Filled 2012-12-11: qty 10

## 2012-12-11 NOTE — Progress Notes (Signed)
ANTICOAGULATION CONSULT NOTE - Follow Up Consult  Pharmacy Consult for Heparin Indication: NSTEMI  No Known Allergies  Patient Measurements: Height: 5\' 2"  (157.5 cm) Weight: 198 lb 10.2 oz (90.1 kg) IBW/kg (Calculated) : 50.1 Heparin Dosing Weight: 70kg  Vital Signs: Temp: 99.7 F (37.6 C) (05/12 0738) Temp src: Oral (05/12 0738) BP: 93/50 mmHg (05/12 0738) Pulse Rate: 69 (05/12 1128)  Labs:  Recent Labs  12/10/12 0827 12/10/12 1527 12/10/12 2031 12/11/12 0420  HGB 13.1  --   --   --   HCT 37.8  --   --   --   PLT 249  --   --   --   APTT 31  --   --   --   LABPROT 12.2  --   --  13.4  INR 0.91  --   --  1.03  CREATININE 0.90  --   --  0.80  TROPONINI 4.95* 10.34* 10.32*  --     Estimated Creatinine Clearance: 81.9 ml/min (by C-G formula based on Cr of 0.8).  Assessment: 56yof initially started on heparin and integrilin for NSTEMI is now s/p cath. She was found to have severe distal circumflex dz and diffuse moderate distal RCA,rPDA dz. Heparin to be resumed post-cath. Was previously on heparin at 1000 units but no levels were ever drawn.    Heparin to resume 8 hours after sheath removal. Sheath was removed at 1240.  Goal of Therapy:  Heparin level 0.3-0.7 units/ml Monitor platelets by anticoagulation protocol: Yes   Plan:  1) At 2040 tonight, begin heparin at 1000 units/hr with NO bolus 2) Check 6 hour level 3) Daily heparin level and CBC  Fredrik Rigger 12/11/2012,1:27 PM

## 2012-12-11 NOTE — Interval H&P Note (Signed)
History and Physical Interval Note:  12/11/2012 9:04 AM  [name]  has presented today for Cardiac Catheterization, with the diagnosis of NSTEMI.   The various methods of treatment have been discussed with the patient and family. After consideration of risks, benefits and other options for treatment, the patient has consented to  Procedure(s): LEFT HEART CATHETERIZATION WITH CORONARY ANGIOGRAM (N/A) as a surgical intervention .    The patient's history has been reviewed, patient examined, no change in status, stable for catheterization.  She has not had any further angina & no bleeding complications.  I have reviewed the patient's chart and labs.  Questions were answered to the patient's satisfaction.     Melik Blancett W

## 2012-12-11 NOTE — Care Management Note (Signed)
    Page 1 of 1   12/11/2012     10:18:36 AM   CARE MANAGEMENT NOTE 12/11/2012  Patient:  NEILAH, FULWIDER   Account Number:  [id number]  Date Initiated:  12/11/2012  Documentation initiated by:  Junius Creamer  Subjective/Objective Assessment:   adm w mi     Action/Plan:   lives w fam   Anticipated DC Date:     Anticipated DC Plan:        DC Planning Services  CM consult      Choice offered to / List presented to:             Status of service:   Medicare Important Message given?   (If response is "NO", the following Medicare IM given date fields will be blank) Date Medicare IM given:   Date Additional Medicare IM given:    Discharge Disposition:    Per UR Regulation:  Reviewed for med. necessity/level of care/duration of stay  If discussed at Long Length of Stay Meetings, dates discussed:    Comments:

## 2012-12-11 NOTE — Brief Op Note (Signed)
12/10/2012 - 12/11/2012  12:59 PM  PATIENT:  [name]  57 y.o. female with history of mild obesity & HTn who presented with NSTEMI.  Referred for cardiac catheterization.  PRE-OPERATIVE DIAGNOSIS:  NSTEMI  POST-OPERATIVE DIAGNOSIS:  Spontaneous dissection of the early mid LAD  Severe distal Circumflex ~80%  Diffuse Moderate 40-60% distal RCA, rPDA disease  Mildly reduced LVEF - ~50% with distal anterior hypokinesis and apical dyskinesis  LVEDP 28 mmHg  PROCEDURE:  Procedure(s): LEFT HEART CATHETERIZATION WITH CORONARY ANGIOGRAM (N/A)  SURGEON:  Surgeon(s) and Role:    * Marykay Lex, MD - Primary  ANESTHESIA:   local and IV sedation; 2 ml Lidocaine, 3mg  Versed, 50 mcg Fentanyl  EBL:  Total I/O In: 396.6 [I.V.:396.6] Out: 525 [Urine:525] ; < 10 ml  EQUIPMENT: R radial access - TIG 4.0 for LCA & RCA Angio; Angled Pigtail LV Gram & hemodynamics   MEDICATIONS USED:  Heparin 4000 Units; Radial Cocktail: 5 mg Verapamil, 400 mcg NTG, 2 ml 2% Lidocaine in 10 ml NS; Omnipaque contrast  SPECIMEN:  No Specimen  TR Band:  13 ml air, 1240 hrs; non-occlusive hemostasis.  DICTATION: .Note written in EPIC  PLAN OF CARE: Return to TCU, hemodynamically stable.  Severe Early mid LAD disease with the appearance of spontaneous coronary dissection with possible intramural hematoma. Possible starting point is ~95% stenosed first Septal Perforator.   Severe distal Circumflex prior the the large terminal posterolateral branch.   Moderate RCA disease mid & distal   Mildly reduced LV Function with anteroapical hypokinesis and apical dyskinesis.   PATIENT DISPOSITION:  PACU - hemodynamically stable.  Marykay Lex, M.D., M.S. [facility] [address] [place], [place]  [address]  [phone] Pager # [phone] 12/11/2012 1:05 PM

## 2012-12-11 NOTE — CV Procedure (Signed)
[facility] CARDIAC CATHETERIZATION  REPORT  NAME:  [name]   MRN: [id number] DOB:  Sep 19, 1955   ADMIT DATE: 12/10/2012 Procedure Date: 12/11/2012  INTERVENTIONAL CARDIOLOGIST: Marykay Lex, M.D., MS PRIMARY CARE PROVIDER: Pcp Not In System PRIMARY CARDIOLOGIST: [name], MD  PATIENT:  [name] is a 57 y.o. female with a history of HTN, obesity, and hypothyroidism who presented on 12/11/12 after a prolonged episode of angina, and has ruled in for NSTEMI.  She is now referred for cardiac catheterization.  PRE-OPERATIVE DIAGNOSIS:    NSTEMI  PROCEDURES PERFORMED:    LEFT HEART CATHETERIZATION WITH CORONARY ANGIOGRAM (N/A)  LEFT VENTRICULOGRAPHY   PROCEDURE:Consent:  Risks of procedure as well as the alternatives and risks of each were explained to the (patient/caregiver).  Consent for procedure obtained. Consent for signed by MD and patient with RN witness -- placed on chart.   PROCEDURE: The patient was brought to the 2nd Floor [facility] in the fasting state and prepped and draped in the usual sterile fashion for Right groin or radial access. A modified Allen's test with plethysmography was performed, revealing excellent Ulnar artery collateral flow.  Sterile technique was used including antiseptics, cap, gloves, gown, hand hygiene, mask and sheet.  Skin prep: Chlorhexidine.  Time Out: Verified patient identification, verified procedure, site/side was marked, verified correct patient position, special equipment/implants available, medications/allergies/relevent history reviewed, required imaging and test results available.  Performed  Access: Right Radial Artery; 5 Fr GlideSheath -- Slender;  Seldinger technique using an Angiocath Micropuncture Kit  IA Radial Cocktail, IV Heparin Diagnostic:  TIG 4.0 advanced over Versicore wire --> exchanged over long-exchange safety J wire for an Angled Pigtail  catheter.  Left Coronary Artery Angiography: TIG 4.0  Right Coronary Artery Angiography: TIG 4.0  LV Hemodynamics (LV Gram): Angled Pigtail.  TR Band:  1240 Hours, 13 mL air  MEDICATIONS:  Anesthesia:  Local Lidocaine 2 ml  Sedation:  3 mg IV Versed, 50 mcg IV fentanyl ;   Premedication: 5 mg PO Valium  Omnipaque Contrast: 125 ml  Anticoagulation:  IV Heparin 4000 Units Radial Cocktail: 5 mg Verapamil, 400 mcg NTG, 2 ml 2% Lidocaine in 10 ml NS   Hemodynamics:  Central Aortic / Mean Pressures: 115/72 mmHg; 93 mmHg  Left Ventricular Pressures / EDP: 115/17 mmHg; 28 mmHg  Left Ventriculography:  EF: ~50 %  Wall Motion: mild distal anterior hypokinesis with apical dyskinesis.  Coronary Anatomy:  Left Main: Large caliber vessel that bifurcates Into the LAD and Circumflex. Angiographically normal.  LAD: This begins as a large caliber vessel which gives off a very early first diagonal branch. Beyond this, the vessel tapers into a diffusely diseased vessel starting at about 50-60% stenosis in a long segment. This comes a large septal perforator that has an ostial 95% stenosis. The LAD then continues with TIMI 2 flow until what appears to be at occlusion point. There is then faint filling to what appears to be another occlusion point. And then TIMI 1 flow that does not quite reach the apex.  D1:  Moderate large-caliber vessel almost as big as in diameter as the proximal LAD. There is diffuse mild to moderate luminal irregularities. The vessel bifurcates covering a large portion of the anterolateral wall.  Reviewing of the LAD with other nitroglycerin colleagues to the time of that catheterization suggested the possibility of spontaneous coronary dissection in the LAD with a spiral dissection appearance.  Left  Circumflex:  Large-caliber vessel that gives rise to a proximal OM 1. The vessel continues into the AV groove circumflex with her is a 70-80% lesion distally before it  terminates as a large inferolateral OM 2. There 2 smaller OM branches between OM1 and OM 2.  With the session of the tubular 70-80% lesion, the remainder the vessels is only minimal luminal irregularities.  OM1:  Moderate caliber lateral branch with a very proximal takeoff. This reaches almost to the apex. Minimal luminal irregularities  OM 2:  This is actually the terminal branch of the OM that courses down along the inferolateral wall to the apex. Diffuse luminal irregularities noted.   RCA:  Large-caliber dominant vessel with diffuse luminal irregularities. In the 40-50% mid lesion in the distal 60% lesion just prior to the bifurcation into the Right Posterior Descending Artery and the Right Posterior AV Groove Branch (RPAV)  RPDA:  Moderate caliber vessel with a proximal 50-60% irregularity. Otherwise diffuse mild luminal irregularities.  RPL Sysytem:The RPAV  is a moderate caliber vessel that terminates as small to moderate right posterior lateral branch.  PATIENT DISPOSITION:    The patient was transferred to the PACU holding area in a hemodynamicaly stable, chest pain free condition.  The patient tolerated the procedure well, and there were no complications.  EBL:   < 10 ml  The patient was stable before, during, and after the procedure.  POST-OPERATIVE DIAGNOSIS:    Severe Early mid LAD disease with the appearance of spontaneous coronary dissection with possible intramural hematoma.  Possible starting point is ~95% stenosed first Septal Perforator.  Severe distal Circumflex prior the the large terminal posterolateral branch.  Moderate RCA disease mid & distal  Mildly reduced LV Function with anteroapical hypokinesis and apical dyskinesis.  PLAN OF CARE:  Will return to TCU for continued monitoring.    I have reviewed the films with Dr.Varanasi who agrees with the diagnosis of LAD dissection.  Will review with other colleagues & CT Surgery.    Plan for now is d/c  Integrelin, restart Heparin 4 hrs post TR band removal.  Will plan to relook in 48-72 hours.   Marykay Lex, M.D., M.S. [facility] [address] [place], [place]  [address]  [phone]  12/11/2012 12:50 PM

## 2012-12-11 NOTE — Progress Notes (Signed)
Subjective:  No chest pain  Objective:  Vital Signs in the last 24 hours: Temp:  [97.7 F (36.5 C)-99.8 F (37.7 C)] 99.7 F (37.6 C) (05/12 0738) Pulse Rate:  [51-79] 79 (05/12 0800) Resp:  [12-26] 26 (05/12 0800) BP: (93-134)/(50-78) 93/50 mmHg (05/12 0738) SpO2:  [92 %-99 %] 94 % (05/12 0800) Weight:  [90.1 kg (198 lb 10.2 oz)-91.7 kg (202 lb 2.6 oz)] 90.1 kg (198 lb 10.2 oz) (05/12 0447)  Intake/Output from previous day:  Intake/Output Summary (Last 24 hours) at 12/11/12 0916 Last data filed at 12/11/12 0900  Gross per 24 hour  Intake 2452.37 ml  Output    125 ml  Net 2327.37 ml    Physical Exam: General appearance: alert, cooperative, no distress and moderately obese Lungs: clear to auscultation bilaterally Heart: regular rate and rhythm   Rate: 78  Rhythm: normal sinus rhythm  Lab Results:  Recent Labs  12/10/12 0827  WBC 7.2  HGB 13.1  PLT 249    Recent Labs  12/10/12 0827 12/11/12 0420  NA 138 139  K 3.8 3.6  CL 102 106  CO2 24 25  GLUCOSE 104* 113*  BUN 18 10  CREATININE 0.90 0.80    Recent Labs  12/10/12 1527 12/10/12 2031  TROPONINI 10.34* 10.32*   Hepatic Function Panel  Recent Labs  12/11/12 0420  PROT 6.1  ALBUMIN 2.6*  AST 48*  ALT 13  ALKPHOS 85  BILITOT 0.4    Recent Labs  12/11/12 0420  CHOL 187    Recent Labs  12/11/12 0420  INR 1.03    Imaging: Imaging results have been reviewed  Cardiac Studies:  Assessment/Plan:   Principal Problem:   NSTEMI (non-ST elevated myocardial infarction) Active Problems:   HTN (hypertension)   Hypothyroid:  she recently stopped Synthroid- "TSH low"- she was to see Endo as OP   GERD   Irritable bowel syndrome   RECTAL BLEEDING Jan 2013   Obesity, unspecified    PLAN:  Cath today. She recently stopped her Synthroid secondary to "low TSH". She was to see an Endocrinologist.  Will not resume Synthroid.  Corine Shelter [name] Beeper [phone] 12/11/2012, 9:16 AM

## 2012-12-11 NOTE — Progress Notes (Signed)
Pt has refused all evening to watch cath video.  Pt states she really does not want to know what is going to happen today, just wants to get it over with.  Pt has low grade fever, states her throat hurts, humidification added to oxygen, pt encouraged to deep breathe and cough.  Will recheck temperature.

## 2012-12-11 NOTE — Progress Notes (Signed)
I have seen and evaluated the patient this AM along with Corine Shelter, PA. I agree with his findings, examination as well as impression recommendations.  Plan Cardiac cath today.  Further plans per findings.   Marykay Lex, M.D., M.S. [facility] [address] [place], [place]  [address]  [phone] Pager # [phone] 12/11/2012 1:06 PM

## 2012-12-12 DIAGNOSIS — I214 Non-ST elevation (NSTEMI) myocardial infarction: Secondary | ICD-10-CM

## 2012-12-12 LAB — CBC
MCH: 29.6 pg (ref 26.0–34.0)
MCV: 87.8 fL (ref 78.0–100.0)
Platelets: 203 10*3/uL (ref 150–400)
RDW: 13.7 % (ref 11.5–15.5)
WBC: 9.7 10*3/uL (ref 4.0–10.5)

## 2012-12-12 LAB — GLUCOSE, CAPILLARY: Glucose-Capillary: 91 mg/dL (ref 70–99)

## 2012-12-12 MED ORDER — HEPARIN (PORCINE) IN NACL 100-0.45 UNIT/ML-% IJ SOLN
1550.0000 [IU]/h | INTRAMUSCULAR | Status: DC
  Filled 2012-12-12 (×2): qty 250

## 2012-12-12 NOTE — Progress Notes (Signed)
[facility]  Subjective: Pt endorses mild intermittent left chest discomfort. Non-radiating. No SOB.  Objective: Vital signs in last 24 hours: Temp:  [97.7 F (36.5 C)-98.9 F (37.2 C)] 98.9 F (37.2 C) (05/13 0700) Pulse Rate:  [69-94] 92 (05/13 0800) Resp:  [16-24] 22 (05/13 0800) BP: (93-133)/(58-88) 101/88 mmHg (05/13 0700) SpO2:  [91 %-99 %] 92 % (05/13 0800) Weight:  [201 lb 8 oz (91.4 kg)] 201 lb 8 oz (91.4 kg) (05/13 0338) Last BM Date: 12/09/12  Intake/Output from previous day: 05/12 0701 - 05/13 0700 In: 1453.9 [I.V.:1453.9] Out: 525 [Urine:525] Intake/Output this shift: Total I/O In: 145.5 [P.O.:120; I.V.:25.5] Out: -   Medications Current Facility-Administered Medications  Medication Dose Route Frequency Provider Last Rate Last Dose  . 0.9 %  sodium chloride infusion  250 mL Intravenous PRN Marykay Lex, MD 10 mL/hr at 12/11/12 2129 250 mL at 12/11/12 2129  . acetaminophen (TYLENOL) tablet 650 mg  650 mg Oral Q4H PRN Abelino Derrick, [name]      . ALPRAZolam Prudy Feeler) tablet 0.25 mg  0.25 mg Oral TID PRN Abelino Derrick, [name]      . aspirin tablet 325 mg  325 mg Oral Daily Marykay Lex, MD   325 mg at 12/12/12 0957  . atorvastatin (LIPITOR) tablet 40 mg  40 mg Oral q1800 Abelino Derrick, [name]   40 mg at 12/11/12 1729  . dicyclomine (BENTYL) capsule 10 mg  10 mg Oral TID PRN Abelino Derrick, [name]      . estradiol (ESTRACE) tablet 0.5 mg  0.5 mg Oral Daily Eda Paschal Kilroy, [name]   0.5 mg at 12/12/12 0957  . heparin ADULT infusion 100 units/mL (25000 units/250 mL)  1,400 Units/hr Intravenous Continuous Marykay Lex, MD 14 mL/hr at 12/12/12 0342 1,400 Units/hr at 12/12/12 0342  . liothyronine (CYTOMEL) tablet 50 mcg  50 mcg Oral Daily Eda Paschal [place], [place]      . morphine 2 MG/ML injection 1 mg  1 mg Intravenous Q1H PRN Marykay Lex, MD   1 mg at 12/11/12 2138  . nitroGLYCERIN (NITROSTAT) SL tablet 0.4 mg  0.4 mg Sublingual Q5 min PRN Ethelda Chick, MD   0.4 mg at 12/10/12 0826  . nitroGLYCERIN 0.2 mg/mL in dextrose 5 % infusion  5 mcg/min Intravenous Titrated Ethelda Chick, MD 1.5 mL/hr at 12/12/12 0337 5 mcg/min at 12/12/12 0337  . ondansetron (ZOFRAN) injection 4 mg  4 mg Intravenous Q6H PRN Eda Paschal Kilroy, [name]      . pantoprazole (PROTONIX) EC tablet 40 mg  40 mg Oral Daily Marykay Lex, MD   40 mg at 12/12/12 0957  . sodium chloride 0.9 % injection 3 mL  3 mL Intravenous Q12H Marykay Lex, MD   3 mL at 12/12/12 0958  . sodium chloride 0.9 % injection 3 mL  3 mL Intravenous PRN Marykay Lex, MD      . zolpidem [facility]) tablet 5 mg  5 mg Oral QHS PRN Abelino Derrick, [name]        PE: General appearance: alert, cooperative and no distress Lungs: clear to auscultation bilaterally Heart: regular rate and rhythm Extremities: no LEE, right wrist is stable Pulses: 2+ and symmetric Skin: warm and dry Neurologic: Grossly normal  Lab Results:   Recent Labs  12/10/12 0827 12/12/12 0420  WBC 7.2 9.7  HGB 13.1 10.9*  HCT 37.8 32.3*  PLT 249 203  BMET  Recent Labs  12/10/12 0827 12/11/12 0420  NA 138 139  K 3.8 3.6  CL 102 106  CO2 24 25  GLUCOSE 104* 113*  BUN 18 10  CREATININE 0.90 0.80  CALCIUM 9.7 8.5   PT/INR  Recent Labs  12/10/12 0827 12/11/12 0420  LABPROT 12.2 13.4  INR 0.91 1.03   Cholesterol  Recent Labs  12/11/12 0420  CHOL 187    Studies/Results: LHC  POST-OPERATIVE DIAGNOSIS:  Spontaneous dissection of the early mid LAD  Severe distal Circumflex ~80%  Diffuse Moderate 40-60% distal RCA, rPDA disease  Mildly reduced LVEF - ~50% with distal anterior hypokinesis and apical dyskinesis  LVEDP 28 mmHg  Assessment/Plan  Principal Problem:   NSTEMI (non-ST elevated myocardial infarction) Active Problems:   GERD   Irritable bowel syndrome   RECTAL BLEEDING Jan 2013   HTN (hypertension)   Hypothyroid:  she recently stopped Synthroid- "TSH low"- she was to see Endo as OP    Obesity, unspecified  Plan: LHC yesterday revealed spontaneous dissection of the early mid LAD, severe distal circumflex ~80%. Diffuse moderate 40-60% distal RCA, rPDA disease, and mildly reduced LVEF - ~50% with distal anterior hypokinesis and apical dyskinesis. No PCI performed. Plan to to recath this admission. Keep on IV heparin and IV NTG for now. HR and BP both stable. Will continue to monitor.     LOS: 2 days    [name] 12/12/2012 10:04 AM  Agree with note written by Boyce Medici  PAC  Pt with NSTEMI s/p cath that showed LAD SCAD with moderate LCX and RCA disease with preserved LV fxn. On IV hep & NTG. Mild chest discomfort. Plan relook cath later this week (Friday) to see if there has been any improvement in the LAD/Diag territory. Prob D/C IV NTG/Hep later this week as well to see how she does on oral anti anginals.  [name] 12/12/2012 10:46 AM

## 2012-12-12 NOTE — Progress Notes (Signed)
ANTICOAGULATION CONSULT NOTE - Follow Up Consult  Pharmacy Consult for Heparin Indication: NSTEMI  No Known Allergies  Patient Measurements: Height: 5\' 2"  (157.5 cm) Weight: 198 lb 10.2 oz (90.1 kg) IBW/kg (Calculated) : 50.1 Heparin Dosing Weight: 70kg  Vital Signs: Temp: 97.7 F (36.5 C) (05/13 0013) Temp src: Oral (05/13 0013) BP: 103/60 mmHg (05/13 0100) Pulse Rate: 82 (05/13 0100)  Labs:  Recent Labs  12/10/12 0827 12/10/12 1527 12/10/12 2031 12/11/12 0420 12/12/12 0221  HGB 13.1  --   --   --   --   HCT 37.8  --   --   --   --   PLT 249  --   --   --   --   APTT 31  --   --   --   --   LABPROT 12.2  --   --  13.4  --   INR 0.91  --   --  1.03  --   HEPARINUNFRC  --   --   --   --  0.10*  CREATININE 0.90  --   --  0.80  --   TROPONINI 4.95* 10.34* 10.32*  --   --     Estimated Creatinine Clearance: 81.9 ml/min (by C-G formula based on Cr of 0.8).  Assessment: 56yof initially started on heparin and integrilin for NSTEMI is now s/p cath. She was found to have severe distal circumflex dz and diffuse moderate distal RCA,rPDA dz. Heparin at 1000/hr =0.1 IU No bleeding noted.  Goal of Therapy:  Heparin level 0.3-0.7 units/ml Monitor platelets by anticoagulation protocol: Yes   Plan:  Increase to 1400 units/hr and recheck in 8 hours continuing to monitor for bleeding [name] 12/12/2012,3:39 AM

## 2012-12-12 NOTE — Progress Notes (Addendum)
ANTICOAGULATION CONSULT NOTE - Follow Up Consult  Pharmacy Consult for Heparin Indication: NSTEMI  No Known Allergies  Patient Measurements: Height: 5\' 2"  (157.5 cm) Weight: 201 lb 8 oz (91.4 kg) IBW/kg (Calculated) : 50.1 Heparin Dosing Weight: 70kg  Vital Signs: Temp: 98.9 F (37.2 C) (05/13 1139) Temp src: Oral (05/13 1139) BP: 129/77 mmHg (05/13 1139) Pulse Rate: 79 (05/13 1139)  Labs:  Recent Labs  12/10/12 0827 12/10/12 1527 12/10/12 2031 12/11/12 0420 12/12/12 0221 12/12/12 0420  HGB 13.1  --   --   --   --  10.9*  HCT 37.8  --   --   --   --  32.3*  PLT 249  --   --   --   --  203  APTT 31  --   --   --   --   --   LABPROT 12.2  --   --  13.4  --   --   INR 0.91  --   --  1.03  --   --   HEPARINUNFRC  --   --   --   --  0.10*  --   CREATININE 0.90  --   --  0.80  --   --   TROPONINI 4.95* 10.34* 10.32*  --   --   --     Estimated Creatinine Clearance: 82.6 ml/min (by C-G formula based on Cr of 0.8).   Medications:  Heparin @ 1400 units/hr  Assessment: 56yof continues on heparin for NSTEMI with plans for relook cath on Friday to see if there has been any improvement in the LAD/Diag territory. Per lab tech and RN, heparin level has been drawn but results are not available yet. Hgb/Hct low but stable, platelets ok. No bleeding reported.  Goal of Therapy:  Heparin level 0.3-0.7 units/ml Monitor platelets by anticoagulation protocol: Yes   Plan:  1) Await heparin level result  Fredrik Rigger 12/12/2012,1:21 PM   Heparin level is therapeutic at 0.30. Will increase rate a little bit to keep at goal.  Plan: 1) Increase heparin to 1550 units/hr 2) Follow up heparin level and CBC in AM  Fredrik Rigger 12/12/12, 2:17 PM

## 2012-12-13 DIAGNOSIS — I214 Non-ST elevation (NSTEMI) myocardial infarction: Secondary | ICD-10-CM

## 2012-12-13 LAB — CBC
HCT: 32.7 % — ABNORMAL LOW (ref 36.0–46.0)
Hemoglobin: 11.1 g/dL — ABNORMAL LOW (ref 12.0–15.0)
MCH: 29.3 pg (ref 26.0–34.0)
MCHC: 33.9 g/dL (ref 30.0–36.0)
MCV: 86.3 fL (ref 78.0–100.0)
RDW: 13.4 % (ref 11.5–15.5)

## 2012-12-13 MED ORDER — LISINOPRIL 2.5 MG PO TABS
2.5000 mg | ORAL_TABLET | Freq: Two times a day (BID) | ORAL | Status: DC
  Administered 2012-12-13 – 2012-12-16 (×6): 2.5 mg via ORAL
  Filled 2012-12-13 (×8): qty 1

## 2012-12-13 MED ORDER — ISOSORBIDE MONONITRATE ER 30 MG PO TB24
30.0000 mg | ORAL_TABLET | Freq: Every day | ORAL | Status: DC
  Administered 2012-12-13 – 2012-12-15 (×3): 30 mg via ORAL
  Filled 2012-12-13 (×5): qty 1

## 2012-12-13 MED ORDER — METOPROLOL TARTRATE 12.5 MG HALF TABLET
12.5000 mg | ORAL_TABLET | Freq: Two times a day (BID) | ORAL | Status: DC
  Administered 2012-12-13 – 2012-12-14 (×3): 12.5 mg via ORAL
  Filled 2012-12-13 (×5): qty 1

## 2012-12-13 MED ORDER — FUROSEMIDE 10 MG/ML IJ SOLN
40.0000 mg | Freq: Once | INTRAMUSCULAR | Status: AC
  Administered 2012-12-13: 40 mg via INTRAVENOUS
  Filled 2012-12-13: qty 4

## 2012-12-13 NOTE — Progress Notes (Signed)
Called to patient room, patient having chest pain radiating to left arm. EKG completed, 1 mg Morphine given, PA notified. Chest pain is now a 2/10, patient resting. Will continue to monitor.

## 2012-12-13 NOTE — Progress Notes (Signed)
ANTICOAGULATION CONSULT NOTE - Follow Up Consult  Pharmacy Consult for Heparin Indication: NSTEMI  No Known Allergies  Patient Measurements: Height: 5\' 2"  (157.5 cm) Weight: 201 lb 8 oz (91.4 kg) IBW/kg (Calculated) : 50.1 Heparin Dosing Weight: 70kg  Vital Signs: Temp: 98.5 F (36.9 C) (05/14 0740) Temp src: Oral (05/14 0740) BP: 126/60 mmHg (05/14 0740) Pulse Rate: 74 (05/14 0740)  Labs:  Recent Labs  12/10/12 1527 12/10/12 2031 12/11/12 0420 12/12/12 0221 12/12/12 0420 12/12/12 0945 12/13/12 0430  HGB  --   --   --   --  10.9*  --  11.1*  HCT  --   --   --   --  32.3*  --  32.7*  PLT  --   --   --   --  203  --  208  LABPROT  --   --  13.4  --   --   --   --   INR  --   --  1.03  --   --   --   --   HEPARINUNFRC  --   --   --  0.10*  --  0.30 0.53  CREATININE  --   --  0.80  --   --   --   --   TROPONINI 10.34* 10.32*  --   --   --   --   --     Estimated Creatinine Clearance: 82.6 ml/min (by C-G formula based on Cr of 0.8).   Medications:  Heparin @ 1550 units/hr  Assessment: 57 y.o. F who continues on heparin for NSTEMI with plans for relook cath on Friday to see if there has been any improvement in the LAD/Diag territory. Heparin level this morning is therapeutic (HL 0.53 << 0.3, goal of 0.3-0.7). Hgb/Hct/Plt stable. No overt s/sx of bleeding noted.   Goal of Therapy:  Heparin level 0.3-0.7 units/ml Monitor platelets by anticoagulation protocol: Yes   Plan:  1. Continue heparin at current rate of 1550 units/hr (15.5 ml/hr) 2. Will continue to monitor for any signs/symptoms of bleeding and will follow up with heparin level in the a.m.   Georgina Pillion, PharmD, BCPS Clinical Pharmacist Pager: [phone] 12/13/2012 11:35 AM

## 2012-12-13 NOTE — Progress Notes (Addendum)
[facility]  Subjective: Has HA from NTG. Chest pain resolved.   Objective: Vital signs in last 24 hours: Temp:  [98.2 F (36.8 C)-99.1 F (37.3 C)] 98.5 F (36.9 C) (05/14 0740) Pulse Rate:  [47-92] 74 (05/14 0740) Resp:  [14-28] 18 (05/14 0740) BP: (122-145)/(60-91) 126/60 mmHg (05/14 0740) SpO2:  [91 %-97 %] 97 % (05/14 0740) Last BM Date: 12/09/12  Intake/Output from previous day: 05/13 0701 - 05/14 0700 In: 1409.8 [P.O.:800; I.V.:609.8] Out: 2 [Urine:2] Intake/Output this shift:    Medications Current Facility-Administered Medications  Medication Dose Route Frequency Provider Last Rate Last Dose  . 0.9 %  sodium chloride infusion  250 mL Intravenous PRN Marykay Lex, MD 10 mL/hr at 12/11/12 2129 250 mL at 12/11/12 2129  . acetaminophen (TYLENOL) tablet 650 mg  650 mg Oral Q4H PRN Abelino Derrick, [name]      . ALPRAZolam Prudy Feeler) tablet 0.25 mg  0.25 mg Oral TID PRN Abelino Derrick, [name]      . aspirin tablet 325 mg  325 mg Oral Daily Marykay Lex, MD   325 mg at 12/12/12 0957  . atorvastatin (LIPITOR) tablet 40 mg  40 mg Oral q1800 Eda Paschal Elgin, [name]   40 mg at 12/12/12 1610  . dicyclomine (BENTYL) capsule 10 mg  10 mg Oral TID PRN Abelino Derrick, [name]      . estradiol (ESTRACE) tablet 0.5 mg  0.5 mg Oral Daily Eda Paschal Kilroy, [name]   0.5 mg at 12/12/12 0957  . heparin ADULT infusion 100 units/mL (25000 units/250 mL)  1,550 Units/hr Intravenous Continuous Fredrik Rigger, RPH 15.5 mL/hr at 12/12/12 1800 1,550 Units/hr at 12/12/12 1800  . liothyronine (CYTOMEL) tablet 50 mcg  50 mcg Oral Daily Eda Paschal [place], [place]      . morphine 2 MG/ML injection 1 mg  1 mg Intravenous Q1H PRN Marykay Lex, MD   1 mg at 12/11/12 2138  . nitroGLYCERIN (NITROSTAT) SL tablet 0.4 mg  0.4 mg Sublingual Q5 min PRN Ethelda Chick, MD   0.4 mg at 12/10/12 0826  . nitroGLYCERIN 0.2 mg/mL in dextrose 5 % infusion  5 mcg/min Intravenous Titrated Ethelda Chick, MD  1.5 mL/hr at 12/12/12 1359 5 mcg/min at 12/12/12 1359  . ondansetron (ZOFRAN) injection 4 mg  4 mg Intravenous Q6H PRN Eda Paschal Kilroy, [name]      . pantoprazole (PROTONIX) EC tablet 40 mg  40 mg Oral Daily Marykay Lex, MD   40 mg at 12/12/12 0957  . sodium chloride 0.9 % injection 3 mL  3 mL Intravenous Q12H Marykay Lex, MD   3 mL at 12/12/12 2125  . sodium chloride 0.9 % injection 3 mL  3 mL Intravenous PRN Marykay Lex, MD      . zolpidem [facility]) tablet 5 mg  5 mg Oral QHS PRN Abelino Derrick, [name]        PE: General appearance: alert, cooperative and no distress Lungs: clear to auscultation bilaterally Heart: regular rate and rhythm Extremities: no LEE Pulses: 2+ and symmetric Skin: warm and dry Neurologic: Grossly normal  Lab Results:   Recent Labs  12/10/12 0827 12/12/12 0420 12/13/12 0430  WBC 7.2 9.7 8.1  HGB 13.1 10.9* 11.1*  HCT 37.8 32.3* 32.7*  PLT 249 203 208   BMET  Recent Labs  12/10/12 0827 12/11/12 0420  NA 138 139  K 3.8 3.6  CL 102  106  CO2 24 25  GLUCOSE 104* 113*  BUN 18 10  CREATININE 0.90 0.80  CALCIUM 9.7 8.5   PT/INR  Recent Labs  12/10/12 0827 12/11/12 0420  LABPROT 12.2 13.4  INR 0.91 1.03   Cholesterol  Recent Labs  12/11/12 0420  CHOL 187    Assessment/Plan  Principal Problem:   NSTEMI (non-ST elevated myocardial infarction) Active Problems:   GERD   Irritable bowel syndrome   RECTAL BLEEDING Jan 2013   HTN (hypertension)   Hypothyroid:  she recently stopped Synthroid- "TSH low"- she was to see Endo as OP   Obesity, unspecified  Plan: NSR on telemetry. HR and BP both stable. Currently CP free. Plan for relook cath on 5/16. Keep on IV heparin and NTG for now, with plan to switch to oral nitrates.     LOS: 3 days   [name] 12/13/2012 7:43 AM  I have seen and evaluated the patient this PM along with Boyce Medici, PA. I agree with her findings, examination as well as impression  recommendations.  I have reviewed the films with additional colleagues; no clear consensus as to +/- Spontaneous Coronary dissection -- may simply be multiple lesions.  Plan for now (whith no significant LAD WMA on echo) is to medically manage (minimize activity) until Friday, with plan for "re-look" LCA only cath & consider PTCA (+/- stent in the mid & distal LAD.  Had an episode of CP today that she says lasted < 1 min (ECG stable).  Can convert to PO nitrates if she remains stable until PM dose would be due.  Her LVEDP was somewhat elevated in cath & is net + > 1.5 L overnight (with recent CP, will give one dose IV Lasix to help lower filling pressures).  On ASA, statin only.  Not on additional cardiac medications  -- will add back ACE-I @ 1/2 home dose & initiate low dose BB for additional CAD & antianginal benefit.    Will add prn anxiolytic.  Difficult treatment scenario with known severe LAD disease that is potentially a spontaneous dissection vs. Diffusely diseased (she has notable CAD elsewhere).  With intermittent episodes of angina, she remains stable, but potentially critically ill if significant angina recurs.    Marykay Lex, M.D., M.S. [facility] [address] [place], [place]  [address]  [phone] Pager # [phone] 12/13/2012 3:12 PM

## 2012-12-13 NOTE — Progress Notes (Signed)
1430 Cardiac Rehab Has discussed pt with Dr Herbie Baltimore this am, he stated that we could do gentle ambulation. On arrival pt was having chest discomfort. We will hold ambulation and continue to follow. Melina Copa RN

## 2012-12-14 ENCOUNTER — Encounter (HOSPITAL_COMMUNITY): Payer: Self-pay | Admitting: Cardiology

## 2012-12-14 DIAGNOSIS — I2510 Atherosclerotic heart disease of native coronary artery without angina pectoris: Secondary | ICD-10-CM

## 2012-12-14 HISTORY — DX: Atherosclerotic heart disease of native coronary artery without angina pectoris: I25.10

## 2012-12-14 LAB — CBC
HCT: 34 % — ABNORMAL LOW (ref 36.0–46.0)
MCH: 29.6 pg (ref 26.0–34.0)
MCHC: 34.1 g/dL (ref 30.0–36.0)
MCV: 86.7 fL (ref 78.0–100.0)
RDW: 13.7 % (ref 11.5–15.5)

## 2012-12-14 MED ORDER — SODIUM CHLORIDE 0.9 % IV SOLN
1.0000 mL/kg/h | INTRAVENOUS | Status: DC
  Administered 2012-12-15: 1 mL/kg/h via INTRAVENOUS

## 2012-12-14 MED ORDER — ASPIRIN 81 MG PO CHEW
324.0000 mg | CHEWABLE_TABLET | ORAL | Status: AC
  Administered 2012-12-15: 324 mg via ORAL
  Filled 2012-12-14: qty 4

## 2012-12-14 MED ORDER — SODIUM CHLORIDE 0.9 % IJ SOLN
3.0000 mL | Freq: Two times a day (BID) | INTRAMUSCULAR | Status: DC
  Administered 2012-12-14: 3 mL via INTRAVENOUS

## 2012-12-14 MED ORDER — DIAZEPAM 5 MG PO TABS
5.0000 mg | ORAL_TABLET | ORAL | Status: AC
  Administered 2012-12-15: 5 mg via ORAL
  Filled 2012-12-14: qty 1

## 2012-12-14 MED ORDER — SODIUM CHLORIDE 0.9 % IJ SOLN: 3.0000 mL | INTRAMUSCULAR | Status: DC | PRN

## 2012-12-14 MED ORDER — SODIUM CHLORIDE 0.9 % IV SOLN: 250.0000 mL | INTRAVENOUS | Status: DC | PRN

## 2012-12-14 NOTE — Progress Notes (Signed)
Subjective: Chest pain yesterday relief with Morphine  Objective: Vital signs in last 24 hours: Temp:  [97.9 F (36.6 C)-98.2 F (36.8 C)] 97.9 F (36.6 C) (05/15 0800) Pulse Rate:  [68-76] 76 (05/14 1608) Resp:  [14-22] 17 (05/15 0800) BP: (89-143)/(45-87) 126/71 mmHg (05/15 0800) SpO2:  [93 %-96 %] 93 % (05/15 0800) Weight:  [197 lb 1.5 oz (89.4 kg)] 197 lb 1.5 oz (89.4 kg) (05/15 0500) Weight change:  Last BM Date: 12/09/12 Intake/Output from previous day:+1433  (+5305 since admit) 05/14 0701 - 05/15 0700 In: 1696.8 [P.O.:1280; I.V.:416.8] Out: 1075 [Urine:1075] Intake/Output this shift:    PE: General:Pleasant affect, NAD Neck:supple, no JVD, no bruits  Heart:S1S2 RRR without murmur, gallup, rub or click Lungs:clear without rales, rhonchi, or wheezes ZOX:WRUE, non tender, + BS, do not palpate liver spleen or masses Ext:no lower ext edema, 2+ pedal pulses, 2+ radial pulses Neuro:alert and oriented, MAE, follows commands, + facial symmetry   Lab Results:  Recent Labs  12/13/12 0430 12/14/12 0340  WBC 8.1 8.4  HGB 11.1* 11.6*  HCT 32.7* 34.0*  PLT 208 246     Lab Results  Component Value Date   CHOL 187 12/11/2012   HDL 52 12/11/2012   LDLCALC 103* 12/11/2012   TRIG 159* 12/11/2012   CHOLHDL 3.6 12/11/2012   Lab Results  Component Value Date   HGBA1C  Value: 5.6 (NOTE)   The ADA recommends the following therapeutic goal for glycemic   control related to Hgb A1C measurement:   Goal of Therapy:   < 7.0% Hgb A1C   Reference: American Diabetes Association: Clinical Practice   Recommendations 2008, Diabetes Care,  2008, 31:(Suppl 1). 10/05/2008     Lab Results  Component Value Date   TSH 1.647 12/11/2012        Studies/Results: Cardiac cath 12/11/12 PLAN OF CARE: Return to TCU, hemodynamically stable.  Severe Early mid LAD disease with the appearance of spontaneous coronary dissection with possible intramural hematoma. Possible starting point is ~95% stenosed  first Septal Perforator.  Severe distal Circumflex prior the the large terminal posterolateral branch.  Moderate RCA disease mid & distal  Mildly reduced LV Function with anteroapical hypokinesis and apical dyskinesis.    Medications: I have reviewed the patient's current medications. Scheduled Meds: . aspirin  325 mg Oral Daily  . atorvastatin  40 mg Oral q1800  . estradiol  0.5 mg Oral Daily  . isosorbide mononitrate  30 mg Oral Daily  . liothyronine  50 mcg Oral Daily  . lisinopril  2.5 mg Oral BID  . metoprolol tartrate  12.5 mg Oral BID  . pantoprazole  40 mg Oral Daily  . sodium chloride  3 mL Intravenous Q12H   Continuous Infusions:  PRN Meds:.sodium chloride, acetaminophen, ALPRAZolam, dicyclomine, morphine injection, nitroGLYCERIN, ondansetron (ZOFRAN) IV, sodium chloride, zolpidem  Assessment/Plan: Principal Problem:   NSTEMI (non-ST elevated myocardial infarction) Active Problems:   GERD   Irritable bowel syndrome   RECTAL BLEEDING Jan 2013   HTN (hypertension)   Hypothyroid:  she recently stopped Synthroid- "TSH low"- she was to see Endo as OP   Obesity, unspecified   CAD (coronary artery disease), possible SCAD  PLAN: see Dr. Hazle Coca note  LOS: 4 days   Time spent with pt. :20 minutes including MD time. [facility] R  Nurse Practitioner Certified Pager [phone] 12/14/2012, 8:43 AM   Agree with note written by Nada Boozer RNP  Pt had a brief episode of SSCP with LUE radiation yesterday.  For re look cath tomorrow by Dr. Dwaine Deter. Perhaps the anatomy was evolved and will be more clear. I suspect that she will ultimately require CABG. Exam benign. Labs OK. Off of all iv drugs.  [name] 12/14/2012 8:49 AM

## 2012-12-14 NOTE — Progress Notes (Signed)
1220-1300 Cardiac Rehab Held ambulation today due to cp yesterday. Started MI education with pt. Gave her MI booklet. Discussed MI, activity restrictions, heart healthy diet and Outtp. CRP. She voices understanding. We will follow pt after recath to continue education and ambulate. Melina Copa RN

## 2012-12-15 ENCOUNTER — Encounter (HOSPITAL_COMMUNITY)
Admission: EM | Disposition: A | Discharge status: Home / Self Care | Payer: Self-pay | Source: Home / Self Care | Attending: Cardiology

## 2012-12-15 DIAGNOSIS — I2510 Atherosclerotic heart disease of native coronary artery without angina pectoris: Secondary | ICD-10-CM

## 2012-12-15 HISTORY — PX: LEFT HEART CATHETERIZATION WITH CORONARY ANGIOGRAM: SHX5451

## 2012-12-15 LAB — BASIC METABOLIC PANEL
CO2: 26 mEq/L (ref 19–32)
Chloride: 103 mEq/L (ref 96–112)
Creatinine, Ser: 0.96 mg/dL (ref 0.50–1.10)
GFR calc Af Amer: 75 mL/min — ABNORMAL LOW (ref >90)
Potassium: 3.7 mEq/L (ref 3.5–5.1)
Sodium: 138 mEq/L (ref 135–145)

## 2012-12-15 LAB — PROTIME-INR
INR: 0.97 (ref 0.00–1.49)
Prothrombin Time: 12.8 seconds (ref 11.6–15.2)

## 2012-12-15 LAB — CBC
MCH: 30.1 pg (ref 26.0–34.0)
Platelets: 251 10*3/uL (ref 150–400)
RBC: 3.89 MIL/uL (ref 3.87–5.11)

## 2012-12-15 SURGERY — LEFT HEART CATHETERIZATION WITH CORONARY ANGIOGRAM
Anesthesia: LOCAL

## 2012-12-15 MED ORDER — CLOPIDOGREL BISULFATE 75 MG PO TABS
300.0000 mg | ORAL_TABLET | Freq: Once | ORAL | Status: AC
  Administered 2012-12-15: 17:00:00 300 mg via ORAL
  Filled 2012-12-15: qty 4

## 2012-12-15 MED ORDER — NITROGLYCERIN 1 MG/10 ML FOR IR/CATH LAB
INTRA_ARTERIAL | Status: AC
  Filled 2012-12-15: qty 10

## 2012-12-15 MED ORDER — SODIUM CHLORIDE 0.9 % IV SOLN: 1.0000 mL/kg/h | INTRAVENOUS | Status: AC

## 2012-12-15 MED ORDER — SODIUM CHLORIDE 0.9 % IJ SOLN
3.0000 mL | Freq: Two times a day (BID) | INTRAMUSCULAR | Status: DC
  Administered 2012-12-15: 21:00:00 3 mL via INTRAVENOUS

## 2012-12-15 MED ORDER — FENTANYL CITRATE 0.05 MG/ML IJ SOLN
INTRAMUSCULAR | Status: AC
  Filled 2012-12-15: qty 2

## 2012-12-15 MED ORDER — CLOPIDOGREL BISULFATE 75 MG PO TABS
75.0000 mg | ORAL_TABLET | Freq: Every day | ORAL | Status: DC
  Administered 2012-12-16: 75 mg via ORAL
  Filled 2012-12-15 (×2): qty 1

## 2012-12-15 MED ORDER — SODIUM CHLORIDE 0.9 % IJ SOLN: 3.0000 mL | INTRAMUSCULAR | Status: DC | PRN

## 2012-12-15 MED ORDER — MIDAZOLAM HCL 2 MG/2ML IJ SOLN
INTRAMUSCULAR | Status: AC
  Filled 2012-12-15: qty 2

## 2012-12-15 MED ORDER — VERAPAMIL HCL 2.5 MG/ML IV SOLN
INTRAVENOUS | Status: AC
  Filled 2012-12-15: qty 2

## 2012-12-15 MED ORDER — METOPROLOL TARTRATE 25 MG PO TABS
25.0000 mg | ORAL_TABLET | Freq: Two times a day (BID) | ORAL | Status: DC
  Administered 2012-12-15 – 2012-12-16 (×3): 25 mg via ORAL
  Filled 2012-12-15 (×3): qty 1

## 2012-12-15 MED ORDER — HEART ATTACK BOUNCING BOOK
Freq: Once | Status: AC
  Administered 2012-12-16: 06:00:00
  Filled 2012-12-15: qty 1

## 2012-12-15 MED ORDER — SODIUM CHLORIDE 0.9 % IV SOLN: 250.0000 mL | INTRAVENOUS | Status: DC | PRN

## 2012-12-15 MED ORDER — HEPARIN (PORCINE) IN NACL 2-0.9 UNIT/ML-% IJ SOLN
INTRAMUSCULAR | Status: AC
  Filled 2012-12-15: qty 1000

## 2012-12-15 MED ORDER — LIDOCAINE HCL (PF) 1 % IJ SOLN
INTRAMUSCULAR | Status: AC
  Filled 2012-12-15: qty 30

## 2012-12-15 MED ORDER — ACTIVE PARTNERSHIP FOR HEALTH OF YOUR HEART BOOK
Freq: Once | Status: AC
  Administered 2012-12-16: 06:00:00
  Filled 2012-12-15: qty 1

## 2012-12-15 MED ORDER — HEPARIN SODIUM (PORCINE) 1000 UNIT/ML IJ SOLN
INTRAMUSCULAR | Status: AC
  Filled 2012-12-15: qty 1

## 2012-12-15 NOTE — Progress Notes (Signed)
TR BAND REMOVAL  LOCATION:    right radial  DEFLATED PER PROTOCOL:    yes  TIME BAND OFF / DRESSING APPLIED:    1145   SITE UPON ARRIVAL:    Level 0  SITE AFTER BAND REMOVAL:    Level 0  REVERSE ALLEN'S TEST:     positive  CIRCULATION SENSATION AND MOVEMENT:    Within Normal Limits   yes  COMMENTS:   Tolerated procedure well

## 2012-12-15 NOTE — Interval H&P Note (Signed)
History and Physical Interval Note:  12/15/2012 8:03 AM  [name]  has presented today for cardiac catheterization, with the diagnosis of NSTEMI & possible spontaneous LAD dissection.  The various methods of treatment have been discussed with the patient and family. After consideration of risks, benefits and other options for treatment, the patient has consented to  Procedure(s): LEFT HEART CATHETERIZATION WITH CORONARY ANGIOGRAM (N/A) with Possible Percutaneous Crononary Intervention.  The patient's history has been reviewed, patient examined, no change in status, stable for surgery.  I have reviewed the patient's chart and labs.  Questions were answered to the patient's satisfaction.     Marykay Lex, MD

## 2012-12-15 NOTE — Progress Notes (Signed)
[facility]  Subjective: Denies CP/SOB. No questions regarding cath.  Objective: Vital signs in last 24 hours: Temp:  [97.9 F (36.6 C)-98.7 F (37.1 C)] 98.5 F (36.9 C) (05/16 0400) Pulse Rate:  [74-85] 82 (05/16 0006) Resp:  [16-25] 25 (05/16 0400) BP: (93-138)/(57-81) 121/65 mmHg (05/16 0400) SpO2:  [93 %-97 %] 94 % (05/16 0400) Weight:  [199 lb 8.3 oz (90.5 kg)] 199 lb 8.3 oz (90.5 kg) (05/16 0500) Last BM Date: 12/09/12  Intake/Output from previous day: 05/15 0701 - 05/16 0700 In: 1047.2 [P.O.:840; I.V.:207.2] Out: -  Intake/Output this shift:    Medications Current Facility-Administered Medications  Medication Dose Route Frequency Provider Last Rate Last Dose  . 0.9 %  sodium chloride infusion  250 mL Intravenous PRN Marykay Lex, MD   250 mL at 12/13/12 1900  . 0.9 %  sodium chloride infusion  250 mL Intravenous PRN Nada Boozer, NP      . 0.9 %  sodium chloride infusion  1 mL/kg/hr Intravenous Continuous Nada Boozer, NP 89.4 mL/hr at 12/15/12 0445 1 mL/kg/hr at 12/15/12 0445  . acetaminophen (TYLENOL) tablet 650 mg  650 mg Oral Q4H PRN Abelino Derrick, [name]   650 mg at 12/13/12 0825  . ALPRAZolam Prudy Feeler) tablet 0.25 mg  0.25 mg Oral TID PRN Abelino Derrick, [name]   0.25 mg at 12/14/12 2138  . aspirin tablet 325 mg  325 mg Oral Daily Marykay Lex, MD   325 mg at 12/14/12 1009  . atorvastatin (LIPITOR) tablet 40 mg  40 mg Oral q1800 Abelino Derrick, [name]   40 mg at 12/14/12 1817  . diazepam (VALIUM) tablet 5 mg  5 mg Oral On Call Nada Boozer, NP      . dicyclomine (BENTYL) capsule 10 mg  10 mg Oral TID PRN Abelino Derrick, [name]      . estradiol (ESTRACE) tablet 0.5 mg  0.5 mg Oral Daily Eda Paschal Kilroy, [name]   0.5 mg at 12/14/12 1009  . isosorbide mononitrate (IMDUR) 24 hr tablet 30 mg  30 mg Oral Daily Marykay Lex, MD   30 mg at 12/14/12 2136  . liothyronine (CYTOMEL) tablet 50 mcg  50 mcg Oral Daily Abelino Derrick, [name]   50 mcg at  12/14/12 1007  . lisinopril (PRINIVIL,ZESTRIL) tablet 2.5 mg  2.5 mg Oral BID Marykay Lex, MD   2.5 mg at 12/14/12 2136  . metoprolol tartrate (LOPRESSOR) tablet 12.5 mg  12.5 mg Oral BID Marykay Lex, MD   12.5 mg at 12/14/12 2136  . morphine 2 MG/ML injection 1 mg  1 mg Intravenous Q1H PRN Marykay Lex, MD   1 mg at 12/13/12 1410  . nitroGLYCERIN (NITROSTAT) SL tablet 0.4 mg  0.4 mg Sublingual Q5 min PRN Ethelda Chick, MD   0.4 mg at 12/10/12 0826  . ondansetron (ZOFRAN) injection 4 mg  4 mg Intravenous Q6H PRN Eda Paschal Kilroy, [name]      . pantoprazole (PROTONIX) EC tablet 40 mg  40 mg Oral Daily Marykay Lex, MD   40 mg at 12/14/12 1009  . sodium chloride 0.9 % injection 3 mL  3 mL Intravenous Q12H Marykay Lex, MD   3 mL at 12/14/12 2139  . sodium chloride 0.9 % injection 3 mL  3 mL Intravenous PRN Marykay Lex, MD      . sodium chloride 0.9 % injection 3 mL  3  mL Intravenous Q12H Nada Boozer, NP   3 mL at 12/14/12 2138  . sodium chloride 0.9 % injection 3 mL  3 mL Intravenous PRN Nada Boozer, NP      . zolpidem (AMBIEN) tablet 5 mg  5 mg Oral QHS PRN Abelino Derrick, [name]        PE: General appearance: alert, cooperative and no distress Lungs: clear to auscultation bilaterally Heart: regular rate and rhythm Extremities: no LEE Pulses: 2+ and symmetric Skin: warm and dry Neurologic: Grossly normal  Lab Results:   Recent Labs  12/13/12 0430 12/14/12 0340 12/15/12 0342  WBC 8.1 8.4 7.8  HGB 11.1* 11.6* 11.7*  HCT 32.7* 34.0* 33.9*  PLT 208 246 251   BMET  Recent Labs  12/15/12 0342  NA 138  K 3.7  CL 103  CO2 26  GLUCOSE 110*  BUN 16  CREATININE 0.96  CALCIUM 9.2   PT/INR  Recent Labs  12/15/12 0342  LABPROT 12.8  INR 0.97    Assessment/Plan  Principal Problem:   NSTEMI (non-ST elevated myocardial infarction) Active Problems:   GERD   Irritable bowel syndrome   RECTAL BLEEDING Jan 2013   HTN (hypertension)   Hypothyroid:  she  recently stopped Synthroid- "TSH low"- she was to see Endo as OP   Obesity, unspecified   CAD (coronary artery disease), possible SCAD  Plan:  Plan for re-look LHC with Dr. Herbie Baltimore today. Renal function is normal with SCr of 0.96. INR is 0.97. NSR on telemetry. HR and BP both stable. She has been NPO since midnight. Will follow post-cath.     LOS: 5 days    [name] M. Delmer Islam 12/15/2012 7:46 AM

## 2012-12-15 NOTE — CV Procedure (Addendum)
[facility] CARDIAC CATHETERIZATION REPORT  NAME:  [name]   MRN: [id number] DOB:  01-17-1956   ADMIT DATE: 12/10/2012 Procedure Date: 12/15/2012  INTERVENTIONAL CARDIOLOGIST: Marykay Lex, M.D., MS PRIMARY CARE PROVIDER: Pcp Not In System PRIMARY CARDIOLOGIST: [name], MD  PATIENT:  [name] is a 57 y.o. female HTN, obesity, and hypothyroidism who presented on 12/11/12 after a prolonged episode of angina, and has ruled in for NSTEMI.  Her initial cardiac catheterization on 5/11 demonstrated diffuse LAD disease with essentially subtotal occlusion that had the appearance of possible Spontaneous Coronary Artery Dissection (SCAD, however, with existing CAD, simply diffuse disease was also possible.  She has been maintained on medical therapy and returns for planned re-look cath to re-evaluate the LAD.  PRE-OPERATIVE DIAGNOSIS:    Severe LAD disease, possible SCAD  Recent NSTEMI  PROCEDURES PERFORMED:    CORONARY ANGIOGRAPHY ONLY via Right Radial Artery Access.  PROCEDURE:Consent:  Risks of procedure as well as the alternatives and risks of each were explained to the (patient/caregiver).  Consent for procedure obtained. Consent for signed by MD and patient with RN witness -- placed on chart.   PROCEDURE: The patient was brought to the 2nd Floor [facility] in the fasting state and prepped and draped in the usual sterile fashion for Right groin or radial access. A modified Allen's test with plethysmography was performed, revealing excellent Ulnar artery collateral flow.  Sterile technique was used including antiseptics, cap, gloves, gown, hand hygiene, mask and sheet.  Skin prep: Chlorhexidine.  Time Out: Verified patient identification, verified procedure, site/side was marked, verified correct patient position, special equipment/implants available, medications/allergies/relevent history reviewed, required  imaging and test results available.  Performed  Access: Right Radial Artery; 6 Fr GlideSheath-Slender-- Seldinger technique using an Angiocath Micropuncture Kit.  IA Radial Cocktail, IV Heparin administered. Diagnostic:  TIG 4.0 advanced over a Versicore wire for Left then Right Coronary Artery Angiography.  Intracoronary NTG administered.    TR Band:  100 Hours, 15 mL air; non-occlusive   MEDICATIONS:  Anesthesia:  Local Lidocaine 2 ml  Sedation:  2 mg IV Versed, 50 mcg IV fentanyl ;   Premedication: 5 mg PO Valium  Omnipaque Contrast: 50 ml  Anticoagulation:  IV Heparin 4500 Untis Radial Cocktail: 5 mg Verapamil, 400 mcg NTG, 2 ml 2% Lidocaine in 10 ml NS  Hemodynamics:  Central Aortic / Mean Pressures: 120/82 mmHg; 100 mmHg  Coronary Anatomy:  Left Main: Large caliber vessel that bifurcates Into the LAD and Circumflex. Angiographically normal.  LAD: This begins as a large caliber vessel which gives off a very early first diagonal branch. Beyond this, the vessel tapers into a diffusely diseased vessel starting at about 50-60% stenosis in a long segment. This comes a large septal perforator that has an ostial 95% stenosis. The LAD then continues to the previously documented subtotal occlusion site in the mid vessel where it is now totally occluded.   D1: Moderate large-caliber vessel almost as big as in diameter as the proximal LAD. There is diffuse mild to moderate luminal irregularities. The vessel bifurcates covering a large portion of the anterolateral wall. There are now collaterals to distal LAD diagonals.  Left Circumflex: Large-caliber vessel that gives rise to a proximal OM 1. The vessel continues into the AV groove circumflex with her is a 70-80% lesion distally before it terminates as a large inferolateral OM 2. There 2 smaller OM branches between OM1 and OM  2. With the session of the tubular 70-80% lesion, the remainder the vessels is only minimal luminal irregularities.  (No change from initial) OM1: Moderate caliber lateral branch with a very proximal takeoff. This reaches almost to the apex. Minimal luminal irregularities  OM 2: This is actually the terminal branch of the OM that courses down along the inferolateral wall to the apex. Diffuse luminal irregularities noted.  RCA: Large-caliber dominant vessel with diffuse luminal irregularities. In the 40-50% mid lesion in the distal 60% lesion just prior to the bifurcation into the Right Posterior Descending Artery and the Right Posterior AV Groove Branch (RPAV)  RPDA: Moderate caliber vessel with a proximal 50-60% irregularity. Otherwise diffuse mild luminal irregularities.   There are septal perforators that provide septal to septal collaterals as well as distal RCA to distal LAD collaterals that fill the distal ~1/4 (apical) LAD. RPL Sysytem:The RPAV is a moderate caliber vessel that terminates as small to moderate right posterior lateral branch.  PATIENT DISPOSITION:    The patient was transferred to the PACU holding area in a hemodynamicaly stable, chest pain free condition.  The patient tolerated the procedure well, and there were no complications.  EBL:   < 10 ml  The patient was stable before, during, and after the procedure.  POST-OPERATIVE DIAGNOSIS:    Progression of LAD disease to total occlusion at the original ~95% subtotal location with improved D1-distal LAD and R-L collaterals (septal and distal RPDA).  PLAN OF CARE:  Images were reviewed with Dr. Riley Kill, who recommended RCA angiography to demonstrate collaterals.  Discussion with Drs. Stuckey & Allyson Sabal concluded that the best course of action is not to proceed with extensive LAD PTCA-PCI in the absence of on-going symptoms.  Increase BB dose, continue ACE-I & Imdur.   Based upon PCI CURE - will load with & start daily Plavix.  Restart [facility] ambulation to confirm absence of angina -- if no angina, anticipate d/c as early as tomorrow  AM.  Plan OP Nuclear Perfusion ST to assess for significant Anterior ischemia (or inferolateral with OM lesion).   Marykay Lex, M.D., M.S. [facility] [address] [place], [place]  [address]  [phone]  12/15/2012 10:05 AM

## 2012-12-15 NOTE — Progress Notes (Signed)
I have seen and evaluated the patient this AM along with Boyce Medici, PA. I agree with her findings, examination as well as impression recommendations.  Stable night off of anticoagulation.  Plan is to re-look cath today; +/- PTCA/PCI.    @[facility] @, M.D., M.S. [facility] [address] [place], [place]  [address]  [phone] Pager # [phone] 12/15/2012 8:01 AM

## 2012-12-16 ENCOUNTER — Other Ambulatory Visit: Payer: Self-pay | Admitting: Cardiology

## 2012-12-16 DIAGNOSIS — R079 Chest pain, unspecified: Secondary | ICD-10-CM

## 2012-12-16 MED ORDER — LISINOPRIL 2.5 MG PO TABS: 2.5000 mg | ORAL_TABLET | Freq: Two times a day (BID) | ORAL | Status: DC

## 2012-12-16 MED ORDER — PANTOPRAZOLE SODIUM 40 MG PO TBEC: 40.0000 mg | DELAYED_RELEASE_TABLET | Freq: Every day | ORAL | Status: DC

## 2012-12-16 MED ORDER — METOPROLOL TARTRATE 25 MG PO TABS: 25.0000 mg | ORAL_TABLET | Freq: Two times a day (BID) | ORAL | Status: DC

## 2012-12-16 MED ORDER — ISOSORBIDE MONONITRATE ER 30 MG PO TB24: 30.0000 mg | ORAL_TABLET | Freq: Every day | ORAL | Status: DC

## 2012-12-16 MED ORDER — CLOPIDOGREL BISULFATE 75 MG PO TABS: 75.0000 mg | ORAL_TABLET | Freq: Every day | ORAL | Status: DC

## 2012-12-16 MED ORDER — NITROGLYCERIN 0.4 MG SL SUBL: 0.4000 mg | SUBLINGUAL_TABLET | SUBLINGUAL | Status: DC | PRN

## 2012-12-16 MED ORDER — ATORVASTATIN CALCIUM 40 MG PO TABS: 40.0000 mg | ORAL_TABLET | Freq: Every day | ORAL | Status: DC

## 2012-12-16 NOTE — Progress Notes (Signed)
CARDIAC REHAB PHASE I   PRE:  Rate/Rhythm: 70 Sinus rhythm  BP:  Supine Sitting: 138/92  Standing:    SaO2:   MODE:  Ambulation: 400 ft   POST:  Rate/Rhythem: 75  BP:  Supine:   Sitting: 138/93  Standing:    SaO2:   [name] 1610 am -0900  [name] was ambulated with assistance.  Good pace, denies any CP or increased dyspnea.   Education completed with emphysis on signs and symptoms, NTG use, 911, Nutrition and Cardiac Rehab Phase 2.  Exercise guidelines given.  She does state she would like to try Rehab.  Will sign off CR Phase I.

## 2012-12-16 NOTE — Discharge Summary (Signed)
Physician Discharge Summary  Patient ID: [name] MRN: [id number] DOB/AGE: 1955/11/05 57 y.o.  Admit date: 12/10/2012 Discharge date: 12/16/2012  Admission Diagnoses: NSTEMI  Discharge Diagnoses:  Principal Problem:   NSTEMI (non-ST elevated myocardial infarction) Active Problems:   GERD   Irritable bowel syndrome   RECTAL BLEEDING Jan 2013   HTN (hypertension)   Hypothyroid:  she recently stopped Synthroid- "TSH low"- she was to see Endo as OP   Obesity, unspecified   CAD (coronary artery disease), possible SCAD   Discharged Condition: stable  Hospital Course: The patient is a 57 y.o. female, followed at [facility] by Dr. Allyson Sabal, with a history of HTN, obesity, and hypothyroidism who presented on 12/11/12 after a prolonged episode of angina. Her EKG was without acute changes, but she ruled in for NSTEMI. Her troponin peaked at 4.95. She was placed on IV Heparin and underwent diagnostic coronary angiography. Her initial cardiac catheterization on 5/11, performed by Dr. Herbie Baltimore, demonstrated diffuse LAD disease with essentially subtotal occlusion that had the appearance of possible Spontaneous Coronary Artery Dissection. However, with existing CAD, simply diffuse disease was also possible. It was decided to not do any intervention at that time, but to wait and have the patient return, several days later, for a re-look cath to re-evaluate the LAD. She was maintained on medical therapy and had improvement in symtpoms.  She returned to the cath lab on 5/16 for re-look. This was also performed by Dr. Herbie Baltimore. She was noted to have progression of LAD disease to total occlusion at the original ~95% subtotal location with improved D1-distal LAD and R-L collaterals (septal and distal RPDA). The images were reviewd by Dr. Allyson Sabal and Dr. Riley Kill and it was concluded that the best course of action was not to proceed with extensive LAD PTCA-PCI, in the absence of on-going symptoms. Medical therapy was  recommended. It was decided to start Plavix, increase her BB and to continue on her ACE-I and Imdur. She left the cath lab in stable condition. She had no post-operative complications. Both catheterizations were preformed via the right radial artery. The access site remained stable. She had no further chest pain. Cardiac Rehab was consulted to assess the patient and to assist with ambulation. The patient denied exertional angina. She was last seen and examined by Dr. Royann Shivers, who determined she was stable for discharge home. Dr. Herbie Baltimore had recommended having the patient undergo a nuclear perfusion stress test to assess for significant anterior ischemia. This has been arranged at [facility]. She is scheduled to follow-up at [facility] on 5/29.     Consults: None  Significant Diagnostic Studies:   Initial LHC 12/11/12 POST-OPERATIVE DIAGNOSIS:  Spontaneous dissection of the early mid LAD  Severe distal Circumflex ~80%  Diffuse Moderate 40-60% distal RCA, rPDA disease  Mildly reduced LVEF - ~50% with distal anterior hypokinesis and apical dyskinesis  LVEDP 28 mmHg   Relook Cath 12/15/12 POST-OPERATIVE DIAGNOSIS:  Progression of LAD disease to total occlusion at the original ~95% subtotal location with improved D1-distal LAD and R-L collaterals (septal and distal RPDA).  Treatments: See Hospital Course  Discharge Exam: Blood pressure 133/94, pulse 77, temperature 98.7 F (37.1 C), temperature source Oral, resp. rate 16, height 5\' 2"  (1.575 m), weight 205 lb 7.5 oz (93.2 kg), SpO2 95.00%.   Disposition: 01-Home or Self Care  Discharge Orders   Future Appointments Provider Department Dept Phone   12/28/2012 8:00 AM [name], MD [facility] [place] [phone]  Future Orders Complete By Expires     Amb Referral to Cardiac Rehabilitation  As directed     Diet - low sodium heart healthy  As directed     Driving Restrictions  As directed     Comments:      No  driving for 3 days    Increase activity slowly  As directed     Lifting restrictions  As directed     Comments:      No lifting more than 1/2 gallon of milk for 3 days        Medication List    STOP taking these medications       esomeprazole 40 MG capsule  Commonly known as:  NEXIUM  Replaced by:  pantoprazole 40 MG tablet     lisinopril-hydrochlorothiazide 10-12.5 MG per tablet  Commonly known as:  PRINZIDE,ZESTORETIC      TAKE these medications       ALPRAZolam 0.5 MG tablet  Commonly known as:  XANAX  Take 0.5 mg by mouth at bedtime as needed. For nerves     aspirin 325 MG tablet  Take 325 mg by mouth daily.     atorvastatin 40 MG tablet  Commonly known as:  LIPITOR  Take 1 tablet (40 mg total) by mouth daily at 6 PM.     clopidogrel 75 MG tablet  Commonly known as:  PLAVIX  Take 1 tablet (75 mg total) by mouth daily with breakfast.     CYTOMEL 50 MCG tablet  Generic drug:  liothyronine  Take 50 mcg by mouth daily.     dicyclomine 10 MG capsule  Commonly known as:  BENTYL  Take 1 tab every 6 hours as needed for cramping and spasms.     estradiol 0.5 MG tablet  Commonly known as:  ESTRACE  Take 0.5 mg by mouth daily.     isosorbide mononitrate 30 MG 24 hr tablet  Commonly known as:  IMDUR  Take 1 tablet (30 mg total) by mouth daily.     levothyroxine 50 MCG tablet  Commonly known as:  SYNTHROID, LEVOTHROID  Take 50 mcg by mouth daily.     lisinopril 2.5 MG tablet  Commonly known as:  PRINIVIL,ZESTRIL  Take 1 tablet (2.5 mg total) by mouth 2 (two) times daily.     metoprolol tartrate 25 MG tablet  Commonly known as:  LOPRESSOR  Take 1 tablet (25 mg total) by mouth 2 (two) times daily.     nitroGLYCERIN 0.4 MG SL tablet  Commonly known as:  NITROSTAT  Place 1 tablet (0.4 mg total) under the tongue every 5 (five) minutes as needed for chest pain.     pantoprazole 40 MG tablet  Commonly known as:  PROTONIX  Take 1 tablet (40 mg total) by mouth  daily.           Follow-up Information   Follow up with [facility] (our office will call you to arrange stress test and follow-up)    Contact information:   [phone]     TIME SPENT ON DISCHARGE, INCLUDING PHYSICIAN TIME: > 30 MINUTES  Signed: Allayne Butcher, [name] 12/21/2012, 9:30 AM

## 2012-12-16 NOTE — Progress Notes (Signed)
[facility]  Subjective: Feels great. No further CP. No SOB. Ambulating w/o difficult. Ready for discharge.  Objective: Vital signs in last 24 hours: Temp:  [98 F (36.7 C)-99.1 F (37.3 C)] 98.7 F (37.1 C) (05/17 0735) Pulse Rate:  [63-77] 77 (05/17 0735) Resp:  [15-17] 16 (05/17 0735) BP: (130-146)/(71-94) 133/94 mmHg (05/17 0735) SpO2:  [95 %-99 %] 95 % (05/17 0735) Weight:  [205 lb 7.5 oz (93.2 kg)] 205 lb 7.5 oz (93.2 kg) (05/17 0015) Last BM Date: 12/14/12  Intake/Output from previous day: 05/16 0701 - 05/17 0700 In: 1258.7 [P.O.:480; I.V.:778.7] Out: 1500 [Urine:1500] Intake/Output this shift:    Medications Current Facility-Administered Medications  Medication Dose Route Frequency Provider Last Rate Last Dose  . 0.9 %  sodium chloride infusion  250 mL Intravenous PRN Marykay Lex, MD      . acetaminophen (TYLENOL) tablet 650 mg  650 mg Oral Q4H PRN Abelino Derrick, [name]   650 mg at 12/13/12 0825  . ALPRAZolam Prudy Feeler) tablet 0.25 mg  0.25 mg Oral TID PRN Abelino Derrick, [name]   0.25 mg at 12/14/12 2138  . aspirin tablet 325 mg  325 mg Oral Daily Marykay Lex, MD   325 mg at 12/14/12 1009  . atorvastatin (LIPITOR) tablet 40 mg  40 mg Oral q1800 Eda Paschal Kilroy, [name]   40 mg at 12/15/12 1430  . clopidogrel (PLAVIX) tablet 75 mg  75 mg Oral Q breakfast Marykay Lex, MD      . dicyclomine (BENTYL) capsule 10 mg  10 mg Oral TID PRN Abelino Derrick, [name]      . estradiol (ESTRACE) tablet 0.5 mg  0.5 mg Oral Daily Eda Paschal Kilroy, [name]   0.5 mg at 12/15/12 1417  . isosorbide mononitrate (IMDUR) 24 hr tablet 30 mg  30 mg Oral Daily Marykay Lex, MD   30 mg at 12/15/12 1418  . liothyronine (CYTOMEL) tablet 50 mcg  50 mcg Oral Daily Abelino Derrick, [name]   50 mcg at 12/15/12 1418  . lisinopril (PRINIVIL,ZESTRIL) tablet 2.5 mg  2.5 mg Oral BID Marykay Lex, MD   2.5 mg at 12/15/12 2120  . metoprolol tartrate (LOPRESSOR) tablet 25 mg  25 mg Oral  BID Marykay Lex, MD   25 mg at 12/15/12 2120  . morphine 2 MG/ML injection 1 mg  1 mg Intravenous Q1H PRN Marykay Lex, MD   1 mg at 12/13/12 1410  . nitroGLYCERIN (NITROSTAT) SL tablet 0.4 mg  0.4 mg Sublingual Q5 min PRN Ethelda Chick, MD   0.4 mg at 12/10/12 0826  . ondansetron (ZOFRAN) injection 4 mg  4 mg Intravenous Q6H PRN Eda Paschal Kilroy, [name]      . pantoprazole (PROTONIX) EC tablet 40 mg  40 mg Oral Daily Marykay Lex, MD   40 mg at 12/15/12 1421  . sodium chloride 0.9 % injection 3 mL  3 mL Intravenous Q12H Marykay Lex, MD   3 mL at 12/15/12 2121  . sodium chloride 0.9 % injection 3 mL  3 mL Intravenous PRN Marykay Lex, MD      . zolpidem [facility]) tablet 5 mg  5 mg Oral QHS PRN Abelino Derrick, [name]        PE: General appearance: alert, cooperative and no distress Lungs: clear to auscultation bilaterally Heart: regular rate and rhythm Extremities: no LEE, right wrist is stable Pulses: 2+ and  symmetric Skin: warm and dry Neurologic: Grossly normal  Lab Results:   Recent Labs  12/14/12 0340 12/15/12 0342  WBC 8.4 7.8  HGB 11.6* 11.7*  HCT 34.0* 33.9*  PLT 246 251   BMET  Recent Labs  12/15/12 0342  NA 138  K 3.7  CL 103  CO2 26  GLUCOSE 110*  BUN 16  CREATININE 0.96  CALCIUM 9.2   PT/INR  Recent Labs  12/15/12 0342  LABPROT 12.8  INR 0.97    Studies/Results: LHC Hemodynamics:  Central Aortic / Mean Pressures: 120/82 mmHg; 100 mmHg Coronary Anatomy:  Left Main: Large caliber vessel that bifurcates Into the LAD and Circumflex. Angiographically normal.  LAD: This begins as a large caliber vessel which gives off a very early first diagonal branch. Beyond this, the vessel tapers into a diffusely diseased vessel starting at about 50-60% stenosis in a long segment. This comes a large septal perforator that has an ostial 95% stenosis. The LAD then continues to the previously documented subtotal occlusion site in the mid vessel where it is now  totally occluded.  D1: Moderate large-caliber vessel almost as big as in diameter as the proximal LAD. There is diffuse mild to moderate luminal irregularities. The vessel bifurcates covering a large portion of the anterolateral wall. There are now collaterals to distal LAD diagonals. Left Circumflex: Large-caliber vessel that gives rise to a proximal OM 1. The vessel continues into the AV groove circumflex with her is a 70-80% lesion distally before it terminates as a large inferolateral OM 2. There 2 smaller OM branches between OM1 and OM 2. With the session of the tubular 70-80% lesion, the remainder the vessels is only minimal luminal irregularities. (No change from initial)  OM1: Moderate caliber lateral branch with a very proximal takeoff. This reaches almost to the apex. Minimal luminal irregularities  OM 2: This is actually the terminal branch of the OM that courses down along the inferolateral wall to the apex. Diffuse luminal irregularities noted. RCA: Large-caliber dominant vessel with diffuse luminal irregularities. In the 40-50% mid lesion in the distal 60% lesion just prior to the bifurcation into the Right Posterior Descending Artery and the Right Posterior AV Groove Branch (RPAV)  RPDA: Moderate caliber vessel with a proximal 50-60% irregularity. Otherwise diffuse mild luminal irregularities.  There are septal perforators that provide septal to septal collaterals as well as distal RCA to distal LAD collaterals that fill the distal ~1/4 (apical) LAD. RPL Sysytem:The RPAV is a moderate caliber vessel that terminates as small to moderate right posterior lateral branch.    Assessment/Plan  Principal Problem:   NSTEMI (non-ST elevated myocardial infarction) Active Problems:   GERD   Irritable bowel syndrome   RECTAL BLEEDING Jan 2013   HTN (hypertension)   Hypothyroid:  she recently stopped Synthroid- "TSH low"- she was to see Endo as OP   Obesity, unspecified   CAD (coronary artery  disease), possible SCAD  Plan: S/P re-look cath by Dr. Herbie Baltimore yesterday to re-study the LAD. Post-operative diagnosis was progression of LAD disease to total occlusion at the original ~95% subtotal location with improved D1-distal LAD and R-L collaterals (septal and distal RPDA).  Images were reviewed with Dr. Riley Kill, who recommended RCA angiography to demonstrate collaterals. Discussion with Drs. Stuckey & Allyson Sabal concluded that the best course of action is not to proceed with extensive LAD PTCA-PCI in the absence of on-going symptoms. Decision was reached to treat medically. BB was increased. Continue ACE-I & Imdur. HR and  BP both stable. No further CP. Ambulating with CRH w/o difficulty. NSR on telemetry. Plan for discharge home today. Per Dr. Herbie Baltimore, will arrange OP Nuclear Perfusion ST to assess for significant Anterior ischemia (or inferolateral with OM lesion). Our office will call to arrange appointment.    LOS: 6 days    Brittainy M. Sharol Harness, [name] 12/16/2012 8:25 AM  I have seen and examined the patient along with Brittainy M. Sharol Harness, [name].  I have reviewed the chart, notes and new data.  I agree with PA's note.  Key new complaints: no angina or dyspnea at rest or with exertion Key examination changes: no arrhythmia or signs/symptoms of CHF, healthy radial access site Key new findings / data: mildly elevated LDL  PLAN: Medical management. Outpatient ischemia/viability evaluation. Discussed symptoms of recurent infarction, use of SL NTG, purpose of medications, need to call EMS for angina >20-30', etc.  Thurmon Fair, MD, [facility] [phone] 12/16/2012, 9:19 AM

## 2012-12-27 ENCOUNTER — Ambulatory Visit: Payer: Federal, State, Local not specified - PPO | Admitting: Cardiovascular Disease

## 2012-12-28 ENCOUNTER — Telehealth: Payer: Self-pay | Admitting: Physician Assistant

## 2012-12-28 ENCOUNTER — Ambulatory Visit (HOSPITAL_COMMUNITY)
Admission: RE | Admit: 2012-12-28 | Discharge: 2012-12-28 | Disposition: A | Discharge status: Home / Self Care | Payer: Federal, State, Local not specified - PPO | Source: Ambulatory Visit | Attending: Cardiovascular Disease | Admitting: Cardiovascular Disease

## 2012-12-28 ENCOUNTER — Ambulatory Visit: Payer: Federal, State, Local not specified - PPO | Admitting: Cardiovascular Disease

## 2012-12-28 DIAGNOSIS — R5383 Other fatigue: Secondary | ICD-10-CM | POA: Insufficient documentation

## 2012-12-28 DIAGNOSIS — I252 Old myocardial infarction: Secondary | ICD-10-CM | POA: Insufficient documentation

## 2012-12-28 DIAGNOSIS — R079 Chest pain, unspecified: Secondary | ICD-10-CM

## 2012-12-28 DIAGNOSIS — I2510 Atherosclerotic heart disease of native coronary artery without angina pectoris: Secondary | ICD-10-CM | POA: Insufficient documentation

## 2012-12-28 DIAGNOSIS — R5381 Other malaise: Secondary | ICD-10-CM | POA: Insufficient documentation

## 2012-12-28 MED ORDER — REGADENOSON 0.4 MG/5ML IV SOLN
0.4000 mg | Freq: Once | INTRAVENOUS | Status: AC
  Administered 2012-12-28: 0.4 mg via INTRAVENOUS

## 2012-12-28 MED ORDER — TECHNETIUM TC 99M SESTAMIBI GENERIC - CARDIOLITE
10.5000 | Freq: Once | INTRAVENOUS | Status: AC | PRN
  Administered 2012-12-28: 11 via INTRAVENOUS

## 2012-12-28 MED ORDER — AMINOPHYLLINE 25 MG/ML IV SOLN
75.0000 mg | Freq: Once | INTRAVENOUS | Status: AC
  Administered 2012-12-28: 75 mg via INTRAVENOUS

## 2012-12-28 MED ORDER — TECHNETIUM TC 99M SESTAMIBI GENERIC - CARDIOLITE
30.2000 | Freq: Once | INTRAVENOUS | Status: AC | PRN
  Administered 2012-12-28: 30.2 via INTRAVENOUS

## 2012-12-28 NOTE — Procedures (Addendum)
[facility] [address] [place] [address] [phone]  Cardiology Nuclear Med Study  [name] is a 57 y.o. female     MRN : [id number]     DOB: June 22, 1956  Procedure Date: 12/28/2012  Nuclear Med Background Indication for Stress Test:  Post Hospital History:  CAD;MI-12/10/2012;CATH--12/10/2012 AND 12/15/2012 Cardiac Risk Factors: Family History - CAD, Hypertension, Lipids and Obesity  Symptoms:  Chest Pain and Fatigue   Nuclear Pre-Procedure Caffeine/Decaff Intake:  7:00pm NPO After: 5:00am   IV Site: R Forearm  IV 0.9% NS with Angio Cath:  22g  Chest Size (in):  N/A IV Started by: Emmit Pomfret, RN  Height: 5\' 2"  (1.575 m)  Cup Size: C  BMI:  Body mass index is 37.49 kg/(m^2). Weight:  205 lb (92.987 kg)   Tech Comments:  N/A    Nuclear Med Study 1 or 2 day study: 1 day  Stress Test Type:  Lexiscan  Order Authorizing Provider:  Nanetta Batty, MD   Resting Radionuclide: Technetium 34m Sestamibi  Resting Radionuclide Dose: 10.5 mCi   Stress Radionuclide:  Technetium 32m Sestamibi  Stress Radionuclide Dose: 30.2 mCi           Stress Protocol Rest HR: 63 Stress HR:97  Rest BP: 142/100 Stress BP: 158/98  Exercise Time (min): n/a METS: n/a          Dose of Adenosine (mg):  n/a Dose of Lexiscan: 0.4 mg  Dose of Atropine (mg): n/a Dose of Dobutamine: n/a mcg/kg/min (at max HR)  Stress Test Technologist: Ernestene Mention, CCT Nuclear Technologist: Gonzella Lex, CNMT   Rest Procedure:  Myocardial perfusion imaging was performed at rest 45 minutes following the intravenous administration of Technetium 58m Sestamibi. Stress Procedure:  The patient received IV Lexiscan 0.4 mg over 15-seconds.  Technetium 19m Sestamibi injected at 30-seconds.  There were no significant changes with Lexiscan.  Quantitative spect images were obtained after a 45 minute delay.  Transient Ischemic Dilatation (Normal <1.22):   1.14 Lung/Heart Ratio (Normal <0.45):  0.30 QGS EDV:  97 ml QGS ESV:  43 ml LV Ejection Fraction: 55%  Signed by       Rest ECG: NSR - Normal EKG and No acute changes  Stress ECG: No significant change from baseline ECG  QPS Raw Data Images:  Normal; no motion artifact; normal heart/lung ratio. Stress Images:  There is decreased uptake in the anterior wall. Rest Images:  There is decreased uptake in the anterior wall. Subtraction (SDS):  No evidence of ischemia.  Impression Exercise Capacity:  Lexiscan with no exercise. BP Response:  Normal blood pressure response. Clinical Symptoms:  No significant symptoms noted. ECG Impression:  No significant ST segment change suggestive of ischemia. Comparison with Prior Nuclear Study: No images to compare  Overall Impression:  Low risk stress nuclear study Antero apical scar w/o ischemia. ROV with Dr. Allyson Sabal  LV Wall Motion:  Decreased WM in LAD distribution   [name], MD  12/28/2012 5:20 PM

## 2012-12-28 NOTE — Telephone Encounter (Signed)
Patient wants to know when she can go back to work.  She had stress test today.

## 2012-12-29 ENCOUNTER — Telehealth: Payer: Self-pay | Admitting: Cardiovascular Disease

## 2012-12-29 NOTE — Telephone Encounter (Signed)
Message left for patient to return my call.  Pt called back and informed results have not been read yet and will be discussed at her appt on Monday.  Pt stated she called about returning to work.  Pt informed that will be discussed at her appt on Monday after her results are reviewed.  Pt verbalized understanding and agreed w/ plan.

## 2012-12-29 NOTE — Telephone Encounter (Signed)
Patient wants to know when she can go back to work. Had stress test 10/28/12.

## 2012-12-30 ENCOUNTER — Encounter: Payer: Self-pay | Admitting: Cardiovascular Disease

## 2013-01-01 ENCOUNTER — Ambulatory Visit (INDEPENDENT_AMBULATORY_CARE_PROVIDER_SITE_OTHER): Payer: Federal, State, Local not specified - PPO | Admitting: Cardiovascular Disease

## 2013-01-01 ENCOUNTER — Encounter: Payer: Self-pay | Admitting: Cardiovascular Disease

## 2013-01-01 VITALS — BP 142/70 | HR 80 | Ht 63.0 in | Wt 198.0 lb

## 2013-01-01 DIAGNOSIS — I2510 Atherosclerotic heart disease of native coronary artery without angina pectoris: Secondary | ICD-10-CM

## 2013-01-01 DIAGNOSIS — Z79899 Other long term (current) drug therapy: Secondary | ICD-10-CM

## 2013-01-01 DIAGNOSIS — E785 Hyperlipidemia, unspecified: Secondary | ICD-10-CM

## 2013-01-01 NOTE — Progress Notes (Signed)
01/01/2013 [name]   Aug 28, 1955  [id number]  Primary Physician Pcp Not In System Primary Cardiologist: [name] MD [name]   HPI:  The patient is a 57 y.o. female, followed at [facility] by Dr. Allyson Sabal, with a history of HTN, obesity, and hypothyroidism who presented on 12/11/12 after a prolonged episode of angina. Her EKG was without acute changes, but she ruled in for NSTEMI. Her troponin peaked at 4.95. She was placed on IV Heparin and underwent diagnostic coronary angiography. Her initial cardiac catheterization on 5/11, performed by Dr. Herbie Baltimore, demonstrated diffuse LAD disease with essentially subtotal occlusion that had the appearance of possible Spontaneous Coronary Artery Dissection. However, with existing CAD, simply diffuse disease was also possible. It was decided to not do any intervention at that time, but to wait and have the patient return, several days later, for a re-look cath to re-evaluate the LAD. She was maintained on medical therapy and had improvement in symtpoms. She returned to the cath lab on 5/16 for re-look. This was also performed by Dr. Herbie Baltimore. She was noted to have progression of LAD disease to total occlusion at the original ~95% subtotal location with improved D1-distal LAD and R-L collaterals (septal and distal RPDA). The images were reviewd by Dr. Allyson Sabal and Dr. Riley Kill and it was concluded that the best course of action was not to proceed with extensive LAD PTCA-PCI, in the absence of on-going symptoms. Medical therapy was recommended. It was decided to start Plavix, increase her BB and to continue on her ACE-I and Imdur. She left the cath lab in stable condition. She had no post-operative complications. Both catheterizations were preformed via the right radial artery. The access site remained stable. She had no further chest pain. Cardiac Rehab was consulted to assess the patient and to assist with ambulation. The patient denied exertional angina.  She was last seen and examined by Dr. Royann Shivers, who determined she was stable for discharge home. Dr. Herbie Baltimore had recommended having the patient undergo a nuclear perfusion stress test to assess for significant anterior ischemia. This has been arranged at [facility]. She is scheduled to follow-up at [facility] on 5/29. The patient had a Myoview stress test performed in the office that showed scar in the LAD territory without ischemia. She remains asymptomatic    Current Outpatient Prescriptions  Medication Sig Dispense Refill  . ALPRAZolam (XANAX) 0.5 MG tablet Take 0.5 mg by mouth at bedtime as needed. For nerves      . aspirin 325 MG tablet Take 325 mg by mouth daily.        Marland Kitchen atorvastatin (LIPITOR) 40 MG tablet Take 1 tablet (40 mg total) by mouth daily at 6 PM.  60 tablet  5  . clopidogrel (PLAVIX) 75 MG tablet Take 1 tablet (75 mg total) by mouth daily with breakfast.  30 tablet  5  . dicyclomine (BENTYL) 10 MG capsule Take 1 tab every 6 hours as needed for cramping and spasms.  90 capsule  0  . estradiol (ESTRACE) 0.5 MG tablet Take 0.5 mg by mouth daily.        . isosorbide mononitrate (IMDUR) 30 MG 24 hr tablet Take 1 tablet (30 mg total) by mouth daily.  30 tablet  5  . lisinopril (PRINIVIL,ZESTRIL) 2.5 MG tablet Take 2.5 mg by mouth daily.      . metoprolol tartrate (LOPRESSOR) 25 MG tablet Take 1 tablet (25 mg total) by mouth 2 (two) times daily.  60 tablet  5  .  nitroGLYCERIN (NITROSTAT) 0.4 MG SL tablet Place 1 tablet (0.4 mg total) under the tongue every 5 (five) minutes as needed for chest pain.  25 tablet  5  . pantoprazole (PROTONIX) 40 MG tablet Take 1 tablet (40 mg total) by mouth daily.  30 tablet  5  . [DISCONTINUED] phentermine 15 MG capsule Take 15 mg by mouth every morning.         No current facility-administered medications for this visit.    No Known Allergies  History   Social History  . Marital Status: Single    Spouse Name: N/A    Number of Children: 2  . Years of  Education: N/A   Occupational History  . Drug Programmer, [employer]   Social History Main Topics  . Smoking status: Never Smoker   . Smokeless tobacco: Never Used  . Alcohol Use: No  . Drug Use: No  . Sexually Active: Not Currently   Other Topics Concern  . Not on file   Social History Narrative   Daily caffeine use.     Review of Systems: General: negative for chills, fever, night sweats or weight changes.  Cardiovascular: negative for chest pain, dyspnea on exertion, edema, orthopnea, palpitations, paroxysmal nocturnal dyspnea or shortness of breath Dermatological: negative for rash Respiratory: negative for cough or wheezing Urologic: negative for hematuria Abdominal: negative for nausea, vomiting, diarrhea, bright red blood per rectum, melena, or hematemesis Neurologic: negative for visual changes, syncope, or dizziness All other systems reviewed and are otherwise negative except as noted above.    Blood pressure 142/70, pulse 80, height 5\' 3"  (1.6 m), weight 198 lb (89.812 kg).  General appearance: alert and no distress Neck: no adenopathy, no carotid bruit, no JVD, supple, symmetrical, trachea midline and thyroid not enlarged, symmetric, no tenderness/mass/nodules Lungs: clear to auscultation bilaterally Heart: regular rate and rhythm, S1, S2 normal, no murmur, click, rub or gallop Extremities: extremities normal, atraumatic, no cyanosis or edema  EKG not performed today  ASSESSMENT AND PLAN:   CAD (coronary artery disease), possible SCAD Patient remains critically stable. A Myoview stress test as coronale territory without evidence of ischemia in any any other vascular territories. She denies chest pain or shortness of breath. After review of her cineangiograms with multiple college was decided to treat her medically. She is on appropriate medications.  Hyperlipidemia On statin therapy not at goal. We will recheck in 2 months      [name] MD [facility], [facility] 01/01/2013 9:35 AM

## 2013-01-01 NOTE — Patient Instructions (Addendum)
Your physician wants you to follow-up in: 6 months. You will receive a reminder letter in the mail two months in advance. If you don't receive a letter, please call our office to schedule the follow-up appointment.   Have labwork done in 1-2 months, fasting

## 2013-01-01 NOTE — Assessment & Plan Note (Signed)
Patient remains critically stable. A Myoview stress test as coronale territory without evidence of ischemia in any any other vascular territories. She denies chest pain or shortness of breath. After review of her cineangiograms with multiple [employer] was decided to treat her medically. She is on appropriate medications.

## 2013-01-01 NOTE — Assessment & Plan Note (Signed)
On statin therapy not at goal. We will recheck in 2 months

## 2013-01-04 ENCOUNTER — Telehealth: Payer: Self-pay | Admitting: Cardiovascular Disease

## 2013-01-04 NOTE — Telephone Encounter (Signed)
Returned call.  Pt stated last night when she went to bed her heart was fluttering.  Stated she really couldn't get any sleep.  Stated she got up and took a xanax and was able to get a couple of hours of sleep.  Denied CP, SOB, dizziness, lightheadedness, n/v today.  C/o nausea last night.  Also c/o L arm "heaviness."  Denied numb/tingling in L arm or symptoms in RA.  Pt stated she is a side and back sleeper, but couldn't get comfortable last night.  Denied abdominal pain or sweating.  Pt concerned b/c she had had a heart attack the last time her arm felt like that.  Pt also asked if she needed to let other medical facilities know that she has spontaneous coronary artery dissection.  Pt informed RN will consult w/ MD/PA for advice r/t symptoms.  In regards to notifying other providers, pt informed she can inform them if she would like or can keep a recent copy of her AVS to take to appt that will have updated meds, diagnoses, etc.  Pt verbalized understanding and agreed w/ plan.

## 2013-01-04 NOTE — Telephone Encounter (Signed)
[name] came you call [name] back and see how she's feeling

## 2013-01-04 NOTE — Telephone Encounter (Signed)
Mrs. [name] is calling because last night her heart was fluttering and she was very nausea. Also want to know if she had a medical emergency  should she tell them she had SCAD. Please call at [phone]  Thanks

## 2013-01-04 NOTE — Telephone Encounter (Signed)
Returned call.  Left message to call back before 4pm.  

## 2013-01-05 NOTE — Telephone Encounter (Signed)
Pt called back.  Pt upset that she did not get a call back yesterday.  Stated she was able to sleep last night and feels better today.  Pt stated she still has left arm heaviness and it is not as much as yesterday.  Denied CP, SOB, dizziness, lightheadedness or vomiting.  C/o nausea.  Still having fluttering, but not as much and she rested all day yesterday.  RN apologized that she did not receive a call back and informed message will be given to L. Diona Fanti, PA as Dr. Allyson Sabal is out of the office today.  Pt verbalized understanding and agreed w/ plan.  Message forwarded to Hinda Glatter, [name] for further instructions.  Paper chart#[id number] on Extender cart.

## 2013-01-05 NOTE — Telephone Encounter (Signed)
Pt already taking metoprolol tart 25mg  BID and last appt was 6.2.14.  L. Kilroy, [name] notified and advised pt take metoprolol tart 25mg  one and half tab prn palpitations and call back for appt if no improvement.  Returned call and informed pt per instructions by MD/PA.  Pt verbalized understanding and agreed w/ plan.  Pt will all back if refill needed sooner r/t increased dose working for symptoms.

## 2013-01-05 NOTE — Telephone Encounter (Signed)
Please call her in Metoprolol tarrate 25mg  po BID prn palpitations, #20 with no refils. She needs an appointment to get re established with [place], I can see her next week.

## 2013-01-22 ENCOUNTER — Encounter: Payer: Self-pay | Admitting: Physician Assistant

## 2013-01-22 ENCOUNTER — Observation Stay (HOSPITAL_COMMUNITY)
Admission: AD | Admit: 2013-01-22 | Discharge: 2013-01-23 | Disposition: A | Discharge status: Home / Self Care | Payer: Federal, State, Local not specified - PPO | Source: Ambulatory Visit | Attending: Cardiovascular Disease | Admitting: Cardiovascular Disease

## 2013-01-22 ENCOUNTER — Ambulatory Visit (INDEPENDENT_AMBULATORY_CARE_PROVIDER_SITE_OTHER): Payer: Federal, State, Local not specified - PPO | Admitting: Physician Assistant

## 2013-01-22 ENCOUNTER — Telehealth: Payer: Self-pay | Admitting: Cardiovascular Disease

## 2013-01-22 ENCOUNTER — Encounter (HOSPITAL_COMMUNITY): Payer: Self-pay | Admitting: General Practice

## 2013-01-22 VITALS — BP 120/80 | HR 53 | Ht 63.0 in | Wt 199.0 lb

## 2013-01-22 DIAGNOSIS — E785 Hyperlipidemia, unspecified: Secondary | ICD-10-CM

## 2013-01-22 DIAGNOSIS — I2510 Atherosclerotic heart disease of native coronary artery without angina pectoris: Secondary | ICD-10-CM

## 2013-01-22 DIAGNOSIS — R079 Chest pain, unspecified: Secondary | ICD-10-CM

## 2013-01-22 DIAGNOSIS — I200 Unstable angina: Principal | ICD-10-CM | POA: Insufficient documentation

## 2013-01-22 DIAGNOSIS — I10 Essential (primary) hypertension: Secondary | ICD-10-CM | POA: Insufficient documentation

## 2013-01-22 DIAGNOSIS — K219 Gastro-esophageal reflux disease without esophagitis: Secondary | ICD-10-CM | POA: Diagnosis present

## 2013-01-22 DIAGNOSIS — E039 Hypothyroidism, unspecified: Secondary | ICD-10-CM | POA: Insufficient documentation

## 2013-01-22 DIAGNOSIS — M7918 Myalgia, other site: Secondary | ICD-10-CM

## 2013-01-22 HISTORY — DX: Anxiety disorder, unspecified: F41.9

## 2013-01-22 HISTORY — DX: Gout, unspecified: M10.9

## 2013-01-22 HISTORY — DX: Acute myocardial infarction, unspecified: I21.9

## 2013-01-22 HISTORY — DX: Hypothyroidism, unspecified: E03.9

## 2013-01-22 LAB — BASIC METABOLIC PANEL
BUN: 13 mg/dL (ref 6–23)
Chloride: 104 mEq/L (ref 96–112)
Creatinine, Ser: 0.93 mg/dL (ref 0.50–1.10)
GFR calc Af Amer: 78 mL/min — ABNORMAL LOW (ref >90)
GFR calc non Af Amer: 67 mL/min — ABNORMAL LOW (ref >90)

## 2013-01-22 LAB — CBC
HCT: 34.9 % — ABNORMAL LOW (ref 36.0–46.0)
MCHC: 34.4 g/dL (ref 30.0–36.0)
MCV: 88.1 fL (ref 78.0–100.0)
RDW: 13.6 % (ref 11.5–15.5)

## 2013-01-22 MED ORDER — NITROGLYCERIN 0.4 MG SL SUBL
0.4000 mg | SUBLINGUAL_TABLET | SUBLINGUAL | Status: AC | PRN
  Administered 2013-01-22 (×2): 0.4 mg via SUBLINGUAL

## 2013-01-22 MED ORDER — NITROGLYCERIN 0.4 MG SL SUBL: 0.4000 mg | SUBLINGUAL_TABLET | SUBLINGUAL | Status: DC | PRN

## 2013-01-22 MED ORDER — ISOSORBIDE MONONITRATE ER 30 MG PO TB24
30.0000 mg | ORAL_TABLET | Freq: Every day | ORAL | Status: DC
  Administered 2013-01-23: 30 mg via ORAL
  Filled 2013-01-22: qty 1

## 2013-01-22 MED ORDER — METOPROLOL TARTRATE 25 MG PO TABS
25.0000 mg | ORAL_TABLET | Freq: Two times a day (BID) | ORAL | Status: DC
  Administered 2013-01-22 – 2013-01-23 (×2): 25 mg via ORAL
  Filled 2013-01-22 (×3): qty 1

## 2013-01-22 MED ORDER — ATORVASTATIN CALCIUM 40 MG PO TABS
40.0000 mg | ORAL_TABLET | Freq: Every day | ORAL | Status: DC
  Filled 2013-01-22 (×2): qty 1

## 2013-01-22 MED ORDER — ALPRAZOLAM 0.5 MG PO TABS: 0.5000 mg | ORAL_TABLET | Freq: Every evening | ORAL | Status: DC | PRN

## 2013-01-22 MED ORDER — ONDANSETRON HCL 4 MG/2ML IJ SOLN: 4.0000 mg | Freq: Four times a day (QID) | INTRAMUSCULAR | Status: DC | PRN

## 2013-01-22 MED ORDER — PANTOPRAZOLE SODIUM 40 MG PO TBEC
40.0000 mg | DELAYED_RELEASE_TABLET | Freq: Two times a day (BID) | ORAL | Status: DC
  Administered 2013-01-22 – 2013-01-23 (×2): 40 mg via ORAL
  Filled 2013-01-22: qty 1

## 2013-01-22 MED ORDER — HEPARIN (PORCINE) IN NACL 100-0.45 UNIT/ML-% IJ SOLN
850.0000 [IU]/h | INTRAMUSCULAR | Status: DC
  Administered 2013-01-22: 950 [IU]/h via INTRAVENOUS
  Filled 2013-01-22 (×2): qty 250

## 2013-01-22 MED ORDER — ACETAMINOPHEN 325 MG PO TABS: 650.0000 mg | ORAL_TABLET | ORAL | Status: DC | PRN

## 2013-01-22 MED ORDER — HEPARIN BOLUS VIA INFUSION
4000.0000 [IU] | Freq: Once | INTRAVENOUS | Status: AC
  Administered 2013-01-22: 4000 [IU] via INTRAVENOUS
  Filled 2013-01-22: qty 4000

## 2013-01-22 MED ORDER — DOCUSATE SODIUM 100 MG PO CAPS
100.0000 mg | ORAL_CAPSULE | Freq: Every day | ORAL | Status: DC
  Administered 2013-01-23: 100 mg via ORAL
  Filled 2013-01-22: qty 1

## 2013-01-22 MED ORDER — ASPIRIN EC 81 MG PO TBEC
81.0000 mg | DELAYED_RELEASE_TABLET | Freq: Every day | ORAL | Status: DC
  Administered 2013-01-23: 81 mg via ORAL
  Filled 2013-01-22: qty 1

## 2013-01-22 MED ORDER — DICYCLOMINE HCL 10 MG PO CAPS
10.0000 mg | ORAL_CAPSULE | Freq: Four times a day (QID) | ORAL | Status: DC | PRN
  Filled 2013-01-22: qty 1

## 2013-01-22 MED ORDER — ASPIRIN 325 MG PO TABS: 325.0000 mg | ORAL_TABLET | Freq: Every day | ORAL | Status: DC

## 2013-01-22 MED ORDER — RANOLAZINE ER 500 MG PO TB12
500.0000 mg | ORAL_TABLET | Freq: Two times a day (BID) | ORAL | Status: DC
  Administered 2013-01-22 – 2013-01-23 (×2): 500 mg via ORAL
  Filled 2013-01-22 (×3): qty 1

## 2013-01-22 MED ORDER — CLOPIDOGREL BISULFATE 75 MG PO TABS
75.0000 mg | ORAL_TABLET | Freq: Every day | ORAL | Status: DC
  Administered 2013-01-23: 75 mg via ORAL
  Filled 2013-01-22: qty 1

## 2013-01-22 MED ORDER — LISINOPRIL 2.5 MG PO TABS
2.5000 mg | ORAL_TABLET | Freq: Every day | ORAL | Status: DC
  Administered 2013-01-23: 2.5 mg via ORAL
  Filled 2013-01-22: qty 1

## 2013-01-22 MED ORDER — ESTRADIOL 1 MG PO TABS
0.5000 mg | ORAL_TABLET | Freq: Every day | ORAL | Status: DC
  Administered 2013-01-23: 0.5 mg via ORAL
  Filled 2013-01-22: qty 0.5

## 2013-01-22 NOTE — Assessment & Plan Note (Signed)
Patient will be directly admitted to [facility] telemetry unit for observation as she is currently chest pain-free after receiving sublingual nitroglycerin. We'll cycle troponin and start IV heparin. We'll also add Ranexa and titrate Imdur. Depending on lab results, and continuation of symptoms, may need to reevaluate patient's catheterization films and potential for PCI.

## 2013-01-22 NOTE — Progress Notes (Signed)
Date:  01/22/2013   ID:  [name], DOB [birth year], MRN [id number]  PCP:  Pcp Not In System  Primary Cardiologist:  Allyson Sabal    History of Present Illness: [name] is a 57 y.o. female history of coronary disease, esophageal stricture, gastroesophageal reflux disease, IBS, hypertension.  Hypothyroidism, obesity was seen and admitted to [facility] on 12/11/2012 for prolonged episode of angina. Her EKG was without acute changes, but she ruled in for NSTEMI. Her troponin peaked at 4.95. She was placed on IV Heparin and underwent diagnostic coronary angiography. Her initial cardiac catheterization on 5/11, performed by Dr. Herbie Baltimore, demonstrated diffuse LAD disease with essentially subtotal occlusion that had the appearance of possible Spontaneous Coronary Artery Dissection. However, with existing CAD, simply diffuse disease was also possible. It was decided to not do any intervention at that time, but to wait and have the patient return, several days later, for a re-look cath to re-evaluate the LAD. She was maintained on medical therapy and had improvement in symtpoms. She returned to the cath lab on 5/16 for re-look. This was also performed by Dr. Herbie Baltimore. She was noted to have progression of LAD disease to total occlusion at the original ~95% subtotal location with improved D1-distal LAD and R-L collaterals (septal and distal RPDA). The images were reviewd by Dr. Allyson Sabal and Dr. Riley Kill and it was concluded that the best course of action was not to proceed with extensive LAD PTCA-PCI, in the absence of on-going symptoms. Medical therapy was recommended. It was decided to start Plavix, increase her BB and to continue on her ACE-I and Imdur. She left the cath lab in stable condition. She had no post-operative complications. Both catheterizations were preformed via the right radial artery.\  She presents today after developing angina right after walking up a flight of stairs. She also reports  nausea, sob, radiation to the left arm and left side of her neck.  Here in the clinic she rated the pain as 5/10. She is given one sublingual nitroglycerin which reduced the pain to 2/10 and then a second which eliminated the pain and arm heaviness altogether . The patient currently denies vomiting, fever, orthopnea, dizziness, PND, cough, congestion, abdominal pain, hematochezia, melena, lower extremity edema, claudication.     Wt Readings from Last 3 Encounters:  01/22/13 199 lb (90.266 kg)  01/22/13 199 lb (90.266 kg)  01/01/13 198 lb (89.812 kg)     Past Medical History  Diagnosis Date  . Esophageal stricture   . Duodenitis   . Gastritis   . Tubular adenoma polyp of rectum 2008  . Hypertension     2D ECHO, 09/30/2008 - EF >55%, normal: RENAL DOPPLER, 03/21/2007 - normal duplex study  . GERD (gastroesophageal reflux disease)   . IBS (irritable bowel syndrome)   . Ischemic colitis   . H/O hiatal hernia   . Lower GI bleeding     10/07/11 "I've had 3 episodes in the past year"  . History of esophageal stricture   . Colon polyps   . CAD (coronary artery disease) 12/14/2012  . CAD (coronary artery disease), possible SCAD 12/14/2012  . Dyspnea on exertion     LEXISCAN, 09/30/2008 - normal, EKG negative for ischemia, no ECG changes    No current facility-administered medications for this visit.   Current Outpatient Prescriptions  Medication Sig Dispense Refill  . [DISCONTINUED] phentermine 15 MG capsule Take 15 mg by mouth every morning.  Facility-Administered Medications Ordered in Other Visits  Medication Dose Route Frequency Provider Last Rate Last Dose  . acetaminophen (TYLENOL) tablet 650 mg  650 mg Oral Q4H PRN Wilburt Finlay, [name]      . ALPRAZolam Prudy Feeler) tablet 0.5 mg  0.5 mg Oral QHS PRN Nada Boozer, NP      . Melene Muller ON 01/23/2013] aspirin EC tablet 81 mg  81 mg Oral Daily Wilburt Finlay, [name]      . atorvastatin (LIPITOR) tablet 40 mg  40 mg Oral q1800 Nada Boozer, NP        . Melene Muller ON 01/23/2013] clopidogrel (PLAVIX) tablet 75 mg  75 mg Oral Q breakfast Nada Boozer, NP      . dicyclomine (BENTYL) capsule 10 mg  10 mg Oral Q6H PRN Nada Boozer, NP      . docusate sodium (COLACE) capsule 100 mg  100 mg Oral Daily Nada Boozer, NP      . estradiol (ESTRACE) tablet 0.5 mg  0.5 mg Oral Daily Nada Boozer, NP      . heparin ADULT infusion 100 units/mL (25000 units/250 mL)  950 Units/hr Intravenous Continuous Mihai Croitoru, MD      . heparin bolus via infusion 4,000 Units  4,000 Units Intravenous Once Mihai Croitoru, MD      . isosorbide mononitrate (IMDUR) 24 hr tablet 30 mg  30 mg Oral Daily Nada Boozer, NP      . lisinopril (PRINIVIL,ZESTRIL) tablet 2.5 mg  2.5 mg Oral Daily Nada Boozer, NP      . metoprolol tartrate (LOPRESSOR) tablet 25 mg  25 mg Oral BID Nada Boozer, NP      . nitroGLYCERIN (NITROSTAT) SL tablet 0.4 mg  0.4 mg Sublingual Q5 Min x 3 PRN Wilburt Finlay, [name]      . ondansetron (ZOFRAN) injection 4 mg  4 mg Intravenous Q6H PRN Wilburt Finlay, [name]      . pantoprazole (PROTONIX) EC tablet 40 mg  40 mg Oral BID AC Nada Boozer, NP      . ranolazine (RANEXA) 12 hr tablet 500 mg  500 mg Oral BID Nada Boozer, NP        Allergies:   No Known Allergies  Social History:  The patient  reports that she has never smoked. She has never used smokeless tobacco. She reports that she does not drink alcohol or use illicit drugs.   Family History  Problem Relation Age of Onset  . Colon cancer Brother   . Kidney disease Mother   . Stroke Father   . Heart attack Brother     ROS:  Please see the history of present illness.  All other systems reviewed and negative.   PHYSICAL EXAM: VS:  BP 120/80  Pulse 53  Ht 5\' 3"  (1.6 m)  Wt 199 lb (90.266 kg)  BMI 35.26 kg/m2 Obese, well developed, in mild distress HEENT: Pupils are equal round react to light accommodation extraocular movements are intact.  Neck: no JVDNo cervical lymphadenopathy. Cardiac: Regular  rate and rhythm without murmurs rubs or gallops. Lungs:  clear to auscultation bilaterally, no wheezing, rhonchi or rales Abd: soft, nontender, positive bowel sounds all quadrants, no hepatosplenomegaly Ext: no lower extremity edema.  2+ radial and dorsalis pedis pulses. Skin: warm and dry Neuro:  Grossly normal  EKG:  Sinus bradycardia with a rate of 53 beats per minute with new T wave inversions in V2 V3 V4 when compared to May 2014 study.   ASSESSMENT AND PLAN:  Problem List Items Addressed This Visit   CAD (coronary artery disease), possible SCAD   Hyperlipidemia   Unstable angina     Patient will be directly admitted to [facility] telemetry unit for observation as she is currently chest pain-free after receiving sublingual nitroglycerin. We'll cycle troponin and start IV heparin. We'll also add Ranexa and titrate Imdur. Depending on lab results, and continuation of symptoms, may need to reevaluate patient's catheterization films and potential for PCI.     Other Visit Diagnoses   Chest pain    -  Primary    Relevant Orders       EKG 12-Lead

## 2013-01-22 NOTE — Progress Notes (Signed)
ANTICOAGULATION CONSULT NOTE - Initial Consult  Pharmacy Consult for Heparin Indication: chest pain/ACS  No Known Allergies  Patient Measurements: Height: 5\' 3"  (160 cm) Weight: 199 lb (90.266 kg) IBW/kg (Calculated) : 52.4 Heparin Dosing Weight: 72.5 kg  Vital Signs: Temp: 97.6 F (36.4 C) (06/23 1530) Temp src: Oral (06/23 1530) BP: 142/78 mmHg (06/23 1530) Pulse Rate: 59 (06/23 1530)  Labs: No results found for this basename: HGB, HCT, PLT, APTT, LABPROT, INR, HEPARINUNFRC, CREATININE, CKTOTAL, CKMB, TROPONINI,  in the last 72 hours  Estimated Creatinine Clearance: 69.8 ml/min (by C-G formula based on Cr of 0.96).   Medical History: Past Medical History  Diagnosis Date  . Esophageal stricture   . Duodenitis   . Gastritis   . Tubular adenoma polyp of rectum 2008  . Hypertension     2D ECHO, 09/30/2008 - EF >55%, normal: RENAL DOPPLER, 03/21/2007 - normal duplex study  . GERD (gastroesophageal reflux disease)   . IBS (irritable bowel syndrome)   . Ischemic colitis   . H/O hiatal hernia   . Lower GI bleeding     10/07/11 "I've had 3 episodes in the past year"  . History of esophageal stricture   . Colon polyps   . CAD (coronary artery disease) 12/14/2012  . CAD (coronary artery disease), possible SCAD 12/14/2012  . Dyspnea on exertion     LEXISCAN, 09/30/2008 - normal, EKG negative for ischemia, no ECG changes    Assessment: 62 YOF presented with chest pain and SOB to start IV heparin for ACS. Not on anticoagulation prior to admission. No baseline cbc ordered yet.  Goal of Therapy:  Heparin level 0.3-0.7 units/ml Monitor platelets by anticoagulation protocol: Yes   Plan:  Heparin bolus 4000 units x 1 Heparin infusion 950 units/hr F/u 6 hr heparin level at 2330 Baseline CBC Daily heparin level and cbc with AM labs  Riki Rusk 01/22/2013,5:09 PM

## 2013-01-22 NOTE — Telephone Encounter (Signed)
[name] is calling because she has shortness of breath and chest pains and wants to bee seen today .Marland Kitchen Please call   Thanks

## 2013-01-22 NOTE — Telephone Encounter (Signed)
Per scheduler, pt scheduled to be seen today at 1:40pm.  Pt has arrived for appt.

## 2013-01-22 NOTE — Patient Instructions (Signed)
Patient was advised to go directly [facility] with assistance.

## 2013-01-23 DIAGNOSIS — IMO0001 Reserved for inherently not codable concepts without codable children: Secondary | ICD-10-CM

## 2013-01-23 DIAGNOSIS — I2510 Atherosclerotic heart disease of native coronary artery without angina pectoris: Secondary | ICD-10-CM

## 2013-01-23 LAB — CBC
HCT: 38.5 % (ref 36.0–46.0)
Hemoglobin: 13.2 g/dL (ref 12.0–15.0)
MCH: 30.3 pg (ref 26.0–34.0)
MCH: 30.3 pg (ref 26.0–34.0)
MCHC: 34.5 g/dL (ref 30.0–36.0)
MCV: 88.3 fL (ref 78.0–100.0)
Platelets: 233 10*3/uL (ref 150–400)
RBC: 4.36 MIL/uL (ref 3.87–5.11)

## 2013-01-23 LAB — BASIC METABOLIC PANEL
BUN: 13 mg/dL (ref 6–23)
CO2: 26 mEq/L (ref 19–32)
Chloride: 103 mEq/L (ref 96–112)
Creatinine, Ser: 0.9 mg/dL (ref 0.50–1.10)
Glucose, Bld: 90 mg/dL (ref 70–99)

## 2013-01-23 MED ORDER — ASPIRIN 81 MG PO TBEC: 81.0000 mg | DELAYED_RELEASE_TABLET | Freq: Every day | ORAL | Status: DC

## 2013-01-23 MED ORDER — ISOSORBIDE MONONITRATE ER 30 MG PO TB24
30.0000 mg | ORAL_TABLET | Freq: Once | ORAL | Status: AC
  Administered 2013-01-23: 30 mg via ORAL
  Filled 2013-01-23 (×2): qty 1

## 2013-01-23 MED ORDER — PANTOPRAZOLE SODIUM 40 MG PO TBEC: 40.0000 mg | DELAYED_RELEASE_TABLET | Freq: Two times a day (BID) | ORAL | Status: DC

## 2013-01-23 MED ORDER — ISOSORBIDE MONONITRATE ER 30 MG PO TB24: 60.0000 mg | ORAL_TABLET | Freq: Every day | ORAL | Status: DC

## 2013-01-23 MED ORDER — ISOSORBIDE MONONITRATE ER 60 MG PO TB24: 60.0000 mg | ORAL_TABLET | Freq: Every day | ORAL | Status: DC

## 2013-01-23 MED ORDER — RANOLAZINE ER 500 MG PO TB12: 500.0000 mg | ORAL_TABLET | Freq: Two times a day (BID) | ORAL | Status: DC

## 2013-01-23 NOTE — Progress Notes (Signed)
UR Completed Lindora Alviar Graves-Bigelow, RN,BSN [phone]  

## 2013-01-23 NOTE — Progress Notes (Signed)
Reviewed discharge instructions with patient and she stated her understanding.  Patient awaiting ride home for discharge.  Colman Cater

## 2013-01-23 NOTE — Progress Notes (Signed)
      Subjective: Pt admitted from Office yesterday for chest pain and new EKG changes.  Awaiting yesterday's EKG to compare to today. Mild chest pain "like her usual".  EKG today similar to yesterday.    Objective: Vital signs in last 24 hours: Temp:  [97.6 F (36.4 C)-98.2 F (36.8 C)] 97.8 F (36.6 C) (06/24 0512) Pulse Rate:  [53-67] 58 (06/24 0512) Resp:  [17-18] 18 (06/24 0512) BP: (120-154)/(76-88) 131/76 mmHg (06/24 0512) SpO2:  [92 %-97 %] 92 % (06/24 0512) Weight:  [199 lb (90.266 kg)-204 lb (92.534 kg)] 204 lb (92.534 kg) (06/24 0512) Weight change:  Last BM Date: 01/23/13 Intake/Output from previous day: +240   Intake/Output this shift: Total I/O In: 240 [P.O.:240] Out: -   PE: General:Pleasant affect, NAD Heart:S1S2 RRR without murmur, gallup, rub or click Lungs:clear without rales, rhonchi, or wheezes ZOX:WRUE, non tender, + BS, do not palpate liver spleen or masses Ext:no lower ext edema, 2+ pedal pulses, 2+ radial pulses Neuro:alert and oriented, MAE, follows commands, + facial symmetry  TELE:  SR to SB at 54  Lab Results:  Recent Labs  01/22/13 2351 01/23/13 0525  WBC 8.7 8.1  HGB 11.8* 13.2  HCT 34.2* 38.5  PLT 233 260   BMET  Recent Labs  01/22/13 1709 01/23/13 0525  NA 138 138  K 3.7 3.8  CL 104 103  CO2 24 26  GLUCOSE 91 90  BUN 13 13  CREATININE 0.93 0.90  CALCIUM 9.2 9.2    Recent Labs  01/22/13 2351 01/23/13 0525  TROPONINI <0.30 <0.30     Studies/Results: No results found.  Medications: I have reviewed the patient's current medications. Scheduled Meds: . aspirin EC  81 mg Oral Daily  . atorvastatin  40 mg Oral q1800  . clopidogrel  75 mg Oral Q breakfast  . docusate sodium  100 mg Oral Daily  . estradiol  0.5 mg Oral Daily  . isosorbide mononitrate  30 mg Oral Daily  . lisinopril  2.5 mg Oral Daily  . metoprolol tartrate  25 mg Oral BID  . pantoprazole  40 mg Oral BID AC  . ranolazine  500 mg Oral BID    Continuous Infusions: . heparin 850 Units/hr (01/23/13 0650)   PRN Meds:.acetaminophen, ALPRAZolam, dicyclomine, nitroGLYCERIN, ondansetron (ZOFRAN) IV  Assessment/Plan: Principal Problem:   Unstable angina Active Problems:   GERD   HTN (hypertension)   Hypothyroid:     CAD (coronary artery disease), possible SCAD   Hyperlipidemia  PLAN: began Ranexa yesterday.  Ambulate.  MD to see.  Increased Imdur to 60 mg today.  LOS: 1 day   Time spent with pt. :15 minutes. [facility] R  Nurse Practitioner Certified Pager [phone] 01/23/2013, 10:10 AM

## 2013-01-23 NOTE — Progress Notes (Signed)
ANTICOAGULATION CONSULT NOTE - Follow Up Consult  Pharmacy Consult for heparin Indication: chest pain/ACS  Labs:  Recent Labs  01/22/13 1708 01/22/13 1709 01/22/13 1935 01/22/13 2351  HGB  --   --  12.0 11.8*  HCT  --   --  34.9* 34.2*  PLT  --   --  227 233  HEPARINUNFRC  --   --   --  0.76*  CREATININE  --  0.93  --   --   TROPONINI <0.30  --   --   --     Assessment/Plan: 57yo female slightly supratherapeutic on heparin with initial dosing for CP though suspect bolus may still be at play as bolus/gtt were started late.  Will continue gtt at current rate for now and confirm with am labs.   Vernard Gambles, PharmD, BCPS  01/23/2013,12:41 AM

## 2013-01-23 NOTE — Progress Notes (Signed)
Pt. Seen and examined. Agree with the NP/[name] note as written.  Confusing situation with recurrent angina. She clearly has diffuse LAD disease, which in fact progressed to subtotal occlusion at the last interval cath. Despite this, her recent NST on 12/16/12 showed small area of anteroapical scar, without ischemia - EF 55%. ?what is her chest pain due to .Marland Kitchen There is an exertional component which could be microvascular ischemia or peri-infarct ischemia, although, this was not seen on NST. There is also a musculoskeletal chest pain component with tenderness to the left chest wall along the sternum - she has other areas of myofascial pain and episodes of fatigue "out of the blue" which is reminiscent of fibromyalgia +/- chronic fatigue syndrome. She may benefit from a neuopathic pain medication such as cymbalta or lyrica. I agree with ranexa and feel it may take several weeks to see benefit - as well as uptitrating imdur.   Her cardiac enzymes are negative. I don't think this is ACS. I would recommend stopping heparin. I think she can be discharged home with follow-up on Friday. Keep her out of work until then.  Goal is to see if we can improve her angina, especially walking up stairs or carrying things. It is hard to recommend LIMA without reversible ischemia on her NST - and she is not in favor of surgery, unless absolutely necessary.  Chrystie Nose, MD, [facility] Attending Cardiologist [facility]

## 2013-01-23 NOTE — Progress Notes (Signed)
CARDIAC REHAB PHASE I   PRE:  Rate/Rhythm: 62SR  BP:  Supine:   Sitting: 10/80  Standing:    SaO2: 98%RA  MODE:  Ambulation: 510 ft   POST:  Rate/Rhythm: 76SR  BP:  Supine:   Sitting: 138/82  Standing:    SaO2: 96%RA 1120-1158 Pt walked 510 ft with steady gait. Denied CP. Tolerated well. Pt recently admitted and had received risk factor ed at that time. No questions re ed. Pt has not gotten into CRP 2 yet. Stated she needed to check with insurance. Encouraged pt to do program even if for less than 12 weeks and maybe going to maintenance as pt concerned re cost.   Luetta Nutting, RN BSN  01/23/2013 11:53 AM

## 2013-01-23 NOTE — H&P (Signed)
[name]  01/22/2013 1:40 PM   Office Visit  MRN:  [id number]   Description: 57 year old female  Provider: Wilburt Finlay, [name]  Department: [facility]         Diagnoses    Chest pain    -  Primary    786.50    Unstable angina        411.1    CAD (coronary artery disease)        414.00    Hyperlipidemia        272.4      Reason for Visit    Chest Pain    with radiation to left arm    Shortness of Breath         Progress Notes    Wilburt Finlay, [name] at 01/22/2013  6:03 PM    Status: Sign at close encounter                        Date:  01/22/2013    ID:  [name], DOB 11/09/1955, MRN [id number]   PCP:  Pcp Not In System        Primary Cardiologist:  Allyson Sabal      History of Present Illness: [name] is a 57 y.o. female history of coronary disease, esophageal stricture, gastroesophageal reflux disease, IBS, hypertension.  Hypothyroidism, obesity was seen and admitted to [facility] on 12/11/2012 for prolonged episode of angina. Her EKG was without acute changes, but she ruled in for NSTEMI. Her troponin peaked at 4.95. She was placed on IV Heparin and underwent diagnostic coronary angiography. Her initial cardiac catheterization on 5/11, performed by Dr. Herbie Baltimore, demonstrated diffuse LAD disease with essentially subtotal occlusion that had the appearance of possible Spontaneous Coronary Artery Dissection. However, with existing CAD, simply diffuse disease was also possible. It was decided to not do any intervention at that time, but to wait and have the patient return, several days later, for a re-look cath to re-evaluate the LAD. She was maintained on medical therapy and had improvement in symtpoms. She returned to the cath lab on 5/16 for re-look. This was also performed by Dr. Herbie Baltimore. She was noted to have progression of LAD disease to total occlusion at the original ~95% subtotal location with improved D1-distal LAD and R-L  collaterals (septal and distal RPDA). The images were reviewd by Dr. Allyson Sabal and Dr. Riley Kill and it was concluded that the best course of action was not to proceed with extensive LAD PTCA-PCI, in the absence of on-going symptoms. Medical therapy was recommended. It was decided to start Plavix, increase her BB and to continue on her ACE-I and Imdur. She left the cath lab in stable condition. She had no post-operative complications. Both catheterizations were preformed via the right radial artery.\             She presents today after developing angina right after walking up a flight of stairs. She also reports nausea, sob, radiation to the left arm and left side of her neck.  Here in the clinic she rated the pain as 5/10. She is given one sublingual nitroglycerin which reduced the pain to 2/10 and then a second which eliminated the pain and arm heaviness altogether . The patient currently denies vomiting, fever, orthopnea, dizziness, PND, cough, congestion, abdominal pain, hematochezia, melena, lower extremity edema, claudication.         Wt Readings from Last 3 Encounters:  01/22/13  199 lb (90.266 kg)   01/22/13  199 lb (90.266 kg)   01/01/13  198 lb (89.812 kg)         Past Medical History   Diagnosis  Date   .  Esophageal stricture     .  Duodenitis     .  Gastritis     .  Tubular adenoma polyp of rectum  2008   .  Hypertension         2D ECHO, 09/30/2008 - EF >55%, normal: RENAL DOPPLER, 03/21/2007 - normal duplex study   .  GERD (gastroesophageal reflux disease)     .  IBS (irritable bowel syndrome)     .  Ischemic colitis     .  H/O hiatal hernia     .  Lower GI bleeding         10/07/11 "I've had 3 episodes in the past year"   .  History of esophageal stricture     .  Colon polyps     .  CAD (coronary artery disease)  12/14/2012   .  CAD (coronary artery disease), possible SCAD  12/14/2012   .  Dyspnea on exertion         LEXISCAN, 09/30/2008 - normal, EKG negative for ischemia, no ECG  changes         No current facility-administered medications for this visit.       Current Outpatient Prescriptions   Medication  Sig  Dispense  Refill   .  [DISCONTINUED] phentermine 15 MG capsule  Take 15 mg by mouth every morning.               Facility-Administered Medications Ordered in Other Visits   Medication  Dose  Route  Frequency  Provider  Last Rate  Last Dose   .  acetaminophen (TYLENOL) tablet 650 mg   650 mg  Oral  Q4H PRN  Wilburt Finlay, [name]         .  ALPRAZolam Prudy Feeler) tablet 0.5 mg   0.5 mg  Oral  QHS PRN  Nada Boozer, NP         .  Melene Muller ON 01/23/2013] aspirin EC tablet 81 mg   81 mg  Oral  Daily  Wilburt Finlay, [name]         .  atorvastatin (LIPITOR) tablet 40 mg   40 mg  Oral  q1800  Nada Boozer, NP         .  Melene Muller ON 01/23/2013] clopidogrel (PLAVIX) tablet 75 mg   75 mg  Oral  Q breakfast  Nada Boozer, NP         .  dicyclomine (BENTYL) capsule 10 mg   10 mg  Oral  Q6H PRN  Nada Boozer, NP         .  docusate sodium (COLACE) capsule 100 mg   100 mg  Oral  Daily  Nada Boozer, NP         .  estradiol (ESTRACE) tablet 0.5 mg   0.5 mg  Oral  Daily  Nada Boozer, NP         .  heparin ADULT infusion 100 units/mL (25000 units/250 mL)   950 Units/hr  Intravenous  Continuous  Ameirah Khatoon, MD         .  heparin bolus via infusion 4,000 Units   4,000 Units  Intravenous  Once  Thurmon Fair, MD         .  isosorbide mononitrate (IMDUR) 24 hr tablet 30 mg   30 mg  Oral  Daily  Nada Boozer, NP         .  lisinopril (PRINIVIL,ZESTRIL) tablet 2.5 mg   2.5 mg  Oral  Daily  Nada Boozer, NP         .  metoprolol tartrate (LOPRESSOR) tablet 25 mg   25 mg  Oral  BID  Nada Boozer, NP         .  nitroGLYCERIN (NITROSTAT) SL tablet 0.4 mg   0.4 mg  Sublingual  Q5 Min x 3 PRN  Wilburt Finlay, [name]         .  ondansetron (ZOFRAN) injection 4 mg   4 mg  Intravenous  Q6H PRN  Wilburt Finlay, [name]         .  pantoprazole (PROTONIX) EC tablet 40 mg   40 mg  Oral  BID AC  Nada Boozer, NP          .  ranolazine (RANEXA) 12 hr tablet 500 mg   500 mg  Oral  BID  Nada Boozer, NP              Allergies:   No Known Allergies   Social History:  The patient  reports that she has never smoked. She has never used smokeless tobacco. She reports that she does not drink alcohol or use illicit drugs.     Family History   Problem  Relation  Age of Onset   .  Colon cancer  Brother     .  Kidney disease  Mother     .  Stroke  Father     .  Heart attack  Brother          ROS:  Please see the history of present illness.  All other systems reviewed and negative.     PHYSICAL EXAM: VS:  BP 120/80  Pulse 53  Ht 5\' 3"  (1.6 m)  Wt 199 lb (90.266 kg)  BMI 35.26 kg/m2 Obese, well developed, in mild distress  HEENT: Pupils are equal round react to light accommodation extraocular movements are intact.   Neck: no JVD No cervical lymphadenopathy. Cardiac: Regular rate and rhythm without murmurs rubs or gallops.  Lungs:  clear to auscultation bilaterally, no wheezing, rhonchi or rales  Abd: soft, nontender, positive bowel sounds all quadrants, no hepatosplenomegaly  Ext: no lower extremity edema.  2+ radial and dorsalis pedis pulses. Skin: warm and dry  Neuro:  Grossly normal   EKG:  Sinus bradycardia with a rate of 53 beats per minute with new T wave inversions in V2 V3 V4 when compared to May 2014 study.     ASSESSMENT AND PLAN:    Problem List Items Addressed This Visit     CAD (coronary artery disease), possible SCAD     Hyperlipidemia     Unstable angina       Patient will be directly admitted to [facility] telemetry unit for observation as she is currently chest pain-free after receiving sublingual nitroglycerin. We'll cycle troponin and start IV heparin. We'll also add Ranexa and titrate Imdur. Depending on lab results, and continuation of symptoms, may need to reevaluate patient's catheterization films and potential for PCI.       Other Visit Diagnoses      Chest pain    -  Primary       Relevant Orders  EKG 12-Lead                            Unstable angina - Wilburt Finlay, [name] at 01/22/2013  6:02 PM    Status: Written Related Problem: Unstable angina           Patient will be directly admitted to [facility] telemetry unit for observation as she is currently chest pain-free after receiving sublingual nitroglycerin. We'll cycle troponin and start IV heparin. We'll also add Ranexa and titrate Imdur. Depending on lab results, and continuation of symptoms, may need to reevaluate patient's catheterization films and potential for PCI.          Encounter-Level Documents:           Electronic signature on 01/22/2013 1:40 PM : [email or web address]          Vitals - Last Recorded    BP Pulse Ht Wt BMI      120/80 53 5\' 3"  (1.6 m) 199 lb (90.266 kg) 35.26 kg/m2         Orders Placed This Encounter    EKG 12-Lead [EKG1 Custom]       Ordered Facility-Administered Medications      Dose Freq Start End    nitroGLYCERIN (NITROSTAT) SL tablet 0.4 mg 0.4 mg Every 5 min PRN 01/22/2013 01/22/2013    Class: Normal    Route: Sublingual         Level of Service    PR OFFICE OUTPATIENT VISIT 40 MINUTES [99215]          Patient Instructions    Patient was advised to go directly [facility] with assistance.           Previous Visit      Provider Department Encounter #    01/22/2013 12:55 PM Wilburt Finlay, [name] [facility] [id number]       I have seen and examined the patient along with Wilburt Finlay, PA.  I have reviewed the chart, notes and new data.  I agree with PA/NP's note.  Key new complaints: unstable (prolonged) angina with worsening ECG changes (anterior T wave inversions) Key examination changes: no clinical signs of HF or arrhythmia Key new findings / data: ECG changes as above.  PLAN: Admit for Unstable angina. If enzymes are normal and ECG improves, consider adding Ranexa, as there are no  good revascularization options. If remains unstable, reconsider options for single vessel CABG.  Thurmon Fair, MD, [facility] [phone] 01/23/2013, 9:14 AM

## 2013-01-23 NOTE — Progress Notes (Signed)
ANTICOAGULATION CONSULT NOTE - Follow Up Consult  Pharmacy Consult for heparin Indication: chest pain/ACS  Labs:  Recent Labs  01/22/13 1708 01/22/13 1709  01/22/13 1935 01/22/13 2351 01/23/13 0525  HGB  --   --   < > 12.0 11.8* 13.2  HCT  --   --   --  34.9* 34.2* 38.5  PLT  --   --   --  227 233 260  HEPARINUNFRC  --   --   --   --  0.76* 0.75*  CREATININE  --  0.93  --   --   --   --   TROPONINI <0.30  --   --   --  <0.30  --   < > = values in this interval not displayed.   Assessment: 57yo female remains slightly supratherapeutic on heparin.  Goal of Therapy:  Heparin level 0.3-0.7 units/ml   Plan:  Will decrease heparin gtt by 1 unit/kg/hr to 850 units/hr and check level in 6hr.  Vernard Gambles, PharmD, BCPS  01/23/2013,6:35 AM

## 2013-01-23 NOTE — Discharge Summary (Signed)
Physician Discharge Summary  Patient ID: [name] MRN: [id number] DOB/AGE: 1956/04/03 57 y.o.  Admit date: 01/22/2013 Discharge date: 01/23/2013  Admission Diagnoses: Unstable Angina  Discharge Diagnoses:  Principal Problem:   Unstable angina Active Problems:   GERD   HTN (hypertension)   Hypothyroid:     CAD (coronary artery disease), possible SCAD   Hyperlipidemia   Discharged Condition: stable  Hospital Course: The patient is a 57 y/o female with a history of coronary disease, esophageal stricture, gastroesophageal reflux disease, IBS, hypertension, hypothyroidism and obesity, who was admitted to [facility] on  6/24 for unstable angina. She had a prior admission in May of this year for NSTEMI. A catheterization, at that time, revealed diffuse LAD disease, with subtotal occlusion at the origin. This had progressed to total oclussion, on relook cath several days later, however she had  distal LAD collaterals. It was decided to treat medically. She was discharged home on ASA, Plavix, a BB, an ACE-I and Imdur. After discharge, she underwent an outpatient NST which revealed a small area of anteroapical scar, without ischemia. Her EF was 55%. When she returned on 6/24, she was endorsing chest pain symptoms concerning for unstable angina. She was admitted for observation and MI rule out.  Her EKG was normal. Cardiac enzymes were cycled x 3 and were negative. Her chest pain ultimately resolved with SL NTG. Her Imdur was increased from 30 mg to 60 mg daily. Ranexa was also added. She had no further chest pain. She was last seen and examined by Dr. Rennis Golden, who determined that she was stable for discharge home. She will follow up at [facility] with Wilburt Finlay, [name] on 01/26/13.    Consults: None  Significant Diagnostic Studies: none  Treatments: See Hospital Course  Discharge Exam: Blood pressure 142/84, pulse 57, temperature 98 F (36.7 C), temperature source Oral, resp. rate 18, height 5\' 3"   (1.6 m), weight 204 lb (92.534 kg), SpO2 96.00%.   Disposition: 01-Home or Self Care      Discharge Orders   Future Appointments Provider Department Dept Phone   01/26/2013 10:00 AM Wilburt Finlay, [name] [facility] [place] [phone]   Future Orders Complete By Expires     Diet - low sodium heart healthy  As directed     Discharge instructions  As directed     Comments:      Do not return to work until after follow-up appointment on Friday 6/27    Increase activity slowly  As directed         Medication List    STOP taking these medications       aspirin 325 MG tablet      TAKE these medications       ALPRAZolam 0.5 MG tablet  Commonly known as:  XANAX  Take 0.5 mg by mouth at bedtime as needed for anxiety.     aspirin 81 MG EC tablet  Take 1 tablet (81 mg total) by mouth daily.     atorvastatin 40 MG tablet  Commonly known as:  LIPITOR  Take 1 tablet (40 mg total) by mouth daily at 6 PM.     clopidogrel 75 MG tablet  Commonly known as:  PLAVIX  Take 1 tablet (75 mg total) by mouth daily with breakfast.     estradiol 0.5 MG tablet  Commonly known as:  ESTRACE  Take 0.5 mg by mouth daily.     isosorbide mononitrate 30 MG 24 hr tablet  Commonly  known as:  IMDUR  Take 2 tablets (60 mg total) by mouth daily.     lisinopril 2.5 MG tablet  Commonly known as:  PRINIVIL,ZESTRIL  Take 2.5 mg by mouth daily.     MAGNESIUM PO  Take 1 tablet by mouth daily.     metoprolol tartrate 25 MG tablet  Commonly known as:  LOPRESSOR  Take 1 tablet (25 mg total) by mouth 2 (two) times daily.     nitroGLYCERIN 0.4 MG SL tablet  Commonly known as:  NITROSTAT  Place 1 tablet (0.4 mg total) under the tongue every 5 (five) minutes as needed for chest pain.     pantoprazole 40 MG tablet  Commonly known as:  PROTONIX  Take 1 tablet (40 mg total) by mouth 2 (two) times daily.     ranolazine 500 MG 12 hr tablet  Commonly known as:  RANEXA  Take 1  tablet (500 mg total) by mouth 2 (two) times daily.       Follow-up Information   Follow up with [facility] On 01/26/2013. (our office will call you with the appointment time)    Contact information:   [phone]     TIME SPENT ON DISCHARGE, INCLUDING PHYSICIAN TIME: >30 MINUTES  Signed: Allayne Butcher, [name] 01/25/2013, 5:12 PM

## 2013-01-26 ENCOUNTER — Ambulatory Visit (INDEPENDENT_AMBULATORY_CARE_PROVIDER_SITE_OTHER): Payer: Federal, State, Local not specified - PPO | Admitting: Physician Assistant

## 2013-01-26 ENCOUNTER — Telehealth: Payer: Self-pay | Admitting: Physician Assistant

## 2013-01-26 ENCOUNTER — Encounter: Payer: Self-pay | Admitting: Physician Assistant

## 2013-01-26 VITALS — BP 130/90 | HR 61 | Ht 63.0 in | Wt 198.5 lb

## 2013-01-26 DIAGNOSIS — I2510 Atherosclerotic heart disease of native coronary artery without angina pectoris: Secondary | ICD-10-CM

## 2013-01-26 DIAGNOSIS — E785 Hyperlipidemia, unspecified: Secondary | ICD-10-CM

## 2013-01-26 DIAGNOSIS — I10 Essential (primary) hypertension: Secondary | ICD-10-CM

## 2013-01-26 DIAGNOSIS — E669 Obesity, unspecified: Secondary | ICD-10-CM

## 2013-01-26 MED ORDER — LISINOPRIL 2.5 MG PO TABS: 5.0000 mg | ORAL_TABLET | Freq: Every day | ORAL | Status: DC

## 2013-01-26 MED ORDER — RANOLAZINE ER 500 MG PO TB12: 1000.0000 mg | ORAL_TABLET | Freq: Two times a day (BID) | ORAL | Status: DC

## 2013-01-26 NOTE — Assessment & Plan Note (Signed)
Currently treated with Lipitor 

## 2013-01-26 NOTE — Progress Notes (Signed)
Date:  01/26/2013   ID:  [name], DOB [birth year], MRN [id number]  PCP:  Pcp Not In System  Primary Cardiologist:  Allyson Sabal    History of Present Illness: [name] is a 57 y.o. female  The patient is a 57 y/o female with a history of coronary disease, esophageal stricture, gastroesophageal reflux disease, IBS, hypertension, hypothyroidism and obesity(BMI 35), who was admitted to [facility] on  6/24 for unstable angina. She had a prior admission in May of this year for NSTEMI. A catheterization, at that time, revealed diffuse LAD disease, with subtotal occlusion at the origin. This had progressed to total oclussion, on relook cath several days later, however she had distal LAD collaterals. It was decided to treat medically. She was discharged home on ASA, Plavix, a BB, an ACE-I and Imdur. After discharge, she underwent an outpatient NST which revealed a small area of anteroapical scar, without ischemia. Her EF was 55%. When she returned on 6/24, she was endorsing chest pain symptoms concerning for unstable angina along with new T-wave inversion in leads V1 through 3. She ruled out for MI.  Her chest pain ultimately resolved with SL NTG. Her Imdur was increased from 30 mg to 60 mg daily. Ranexa was also added.   She presents today for followup to hospitalization. She reports no further chest pain and is doing well. She does appear somewhat anxious about her medical situation and expenses incurred.  The patient currently denies nausea, vomiting, fever, chest pain, shortness of breath, orthopnea, dizziness, PND, cough, congestion, abdominal pain, hematochezia, melena, lower extremity edema.   Wt Readings from Last 3 Encounters:  01/26/13 198 lb 8 oz (90.039 kg)  01/23/13 204 lb (92.534 kg)  01/22/13 199 lb (90.266 kg)     Past Medical History  Diagnosis Date  . Esophageal stricture   . Duodenitis   . Gastritis   . Tubular adenoma polyp of rectum 2008  . Hypertension     2D  ECHO, 09/30/2008 - EF >55%, normal: RENAL DOPPLER, 03/21/2007 - normal duplex study  . GERD (gastroesophageal reflux disease)   . IBS (irritable bowel syndrome)   . Ischemic colitis   . H/O hiatal hernia   . Lower GI bleeding     10/07/11 "I've had 3 episodes in the past year"  . History of esophageal stricture   . Colon polyps   . Myocardial infarction 12/10/2012    "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)  . CAD (coronary artery disease), possible SCAD 12/14/2012  . Dyspnea on exertion     LEXISCAN, 09/30/2008 - normal, EKG negative for ischemia, no ECG changes  . Hypothyroidism   . Gout     "only from RX given after MI" (01/22/2013)  . Anxiety     Current Outpatient Prescriptions  Medication Sig Dispense Refill  . ALPRAZolam (XANAX) 0.5 MG tablet Take 0.5 mg by mouth at bedtime as needed for anxiety.       Marland Kitchen aspirin EC 81 MG EC tablet Take 1 tablet (81 mg total) by mouth daily.      Marland Kitchen atorvastatin (LIPITOR) 40 MG tablet Take 1 tablet (40 mg total) by mouth daily at 6 PM.  60 tablet  5  . clopidogrel (PLAVIX) 75 MG tablet Take 1 tablet (75 mg total) by mouth daily with breakfast.  30 tablet  5  . estradiol (ESTRACE) 0.5 MG tablet Take 0.5 mg by mouth daily.        . isosorbide mononitrate (IMDUR)  30 MG 24 hr tablet Take 2 tablets (60 mg total) by mouth daily.  30 tablet  5  . lisinopril (PRINIVIL,ZESTRIL) 2.5 MG tablet Take 2 tablets (5 mg total) by mouth daily.  30 tablet  5  . MAGNESIUM PO Take 1 tablet by mouth daily.      . metoprolol tartrate (LOPRESSOR) 25 MG tablet Take 1 tablet (25 mg total) by mouth 2 (two) times daily.  60 tablet  5  . nitroGLYCERIN (NITROSTAT) 0.4 MG SL tablet Place 1 tablet (0.4 mg total) under the tongue every 5 (five) minutes as needed for chest pain.  25 tablet  5  . pantoprazole (PROTONIX) 40 MG tablet Take 1 tablet (40 mg total) by mouth 2 (two) times daily.  30 tablet  5  . ranolazine (RANEXA) 500 MG 12 hr tablet Take 2 tablets (1,000 mg total)  by mouth 2 (two) times daily.  112 tablet  0  . Vitamin D, Ergocalciferol, (DRISDOL) 50000 UNITS CAPS Take 1 capsule by mouth 2 (two) times a week.      . [DISCONTINUED] phentermine 15 MG capsule Take 15 mg by mouth every morning.         No current facility-administered medications for this visit.    Allergies:   No Known Allergies  Social History:  The patient  reports that she has never smoked. She has never used smokeless tobacco. She reports that she does not drink alcohol or use illicit drugs.   ROS:  Please see the history of present illness.  All other systems reviewed and negative.   PHYSICAL EXAM: VS:  BP 130/90  Pulse 61  Ht 5\' 3"  (1.6 m)  Wt 198 lb 8 oz (90.039 kg)  BMI 35.17 kg/m2 Well nourished, well developed, in no acute distress HEENT: Pupils are equal round react to light accommodation extraocular movements are intact.  Neck: no JVDNo cervical lymphadenopathy. Cardiac: Regular rate and rhythm without murmurs rubs or gallops. Lungs:  clear to auscultation bilaterally, no wheezing, rhonchi or rales Abd: soft, nontender, positive bowel sounds all quadrants, no hepatosplenomegaly Ext: no lower extremity edema.  2+ radial and dorsalis pedis pulses. Skin: warm and dry Neuro:  Grossly normal  EKG:  Normal sinus rhythm T-wave inversions V1 through 3.  Rate 61 beats per minute  ASSESSMENT AND PLAN:  Problem List Items Addressed This Visit   CAD (coronary artery disease), possible SCAD - Primary     Left heart catheterization May 2014 revealed diffuse LAD disease, with subtotal occlusion at the origin. This had progressed to total oclussion, on relook cath several days later, however she had distal LAD collaterals. In was increased to 60 mg at discharge she was also started on Ranexa 500 mg twice daily. We'll increase that to 1000 mg twice daily today. Patient was provided with samples.     Relevant Medications      lisinopril (PRINIVIL,ZESTRIL) tablet      ranolazine  (RANEXA) 12 hr tablet   Other Relevant Orders      EKG 12-Lead   HTN (hypertension) (Chronic)     Increase lisinopril to 5 mg daily    Relevant Medications      lisinopril (PRINIVIL,ZESTRIL) tablet      ranolazine (RANEXA) 12 hr tablet   Hyperlipidemia     Currently treated with Lipitor    Relevant Medications      lisinopril (PRINIVIL,ZESTRIL) tablet      ranolazine (RANEXA) 12 hr tablet   Obesity (BMI 35.0-39.9 without  comorbidity)     Patient will be set up for medical nutrition consult.

## 2013-01-26 NOTE — Patient Instructions (Signed)
Increase Ranexa 1000 mg twice daily.   I will also increase her lisinopril to 5 mg daily. To help with your blood pressure. Followup with Dr. Allyson Sabal in one month

## 2013-01-26 NOTE — Assessment & Plan Note (Signed)
Left heart catheterization May 2014 revealed diffuse LAD disease, with subtotal occlusion at the origin. This had progressed to total oclussion, on relook cath several days later, however she had distal LAD collaterals. In was increased to 60 mg at discharge she was also started on Ranexa 500 mg twice daily. We'll increase that to 1000 mg twice daily today. Patient was provided with samples.

## 2013-01-26 NOTE — Telephone Encounter (Signed)
Need to speak to someone to give her a verbal prescription on Lisinopril 2.5.. Misplaced the eletronic hard copy ..,   Thanks

## 2013-01-26 NOTE — Assessment & Plan Note (Signed)
Increase lisinopril to 5 mg daily

## 2013-01-26 NOTE — Telephone Encounter (Signed)
Rx resent.

## 2013-01-26 NOTE — Assessment & Plan Note (Signed)
Patient will be set up for medical nutrition consult.

## 2013-02-03 ENCOUNTER — Encounter (HOSPITAL_COMMUNITY): Payer: Self-pay | Admitting: Emergency Medicine

## 2013-02-03 ENCOUNTER — Emergency Department (HOSPITAL_COMMUNITY): Payer: Federal, State, Local not specified - PPO

## 2013-02-03 ENCOUNTER — Other Ambulatory Visit: Payer: Self-pay

## 2013-02-03 ENCOUNTER — Emergency Department (HOSPITAL_COMMUNITY)
Admission: EM | Admit: 2013-02-03 | Discharge: 2013-02-03 | Disposition: A | Discharge status: Home / Self Care | Payer: Federal, State, Local not specified - PPO | Attending: Emergency Medicine | Admitting: Emergency Medicine

## 2013-02-03 DIAGNOSIS — I2510 Atherosclerotic heart disease of native coronary artery without angina pectoris: Secondary | ICD-10-CM

## 2013-02-03 DIAGNOSIS — Z90710 Acquired absence of both cervix and uterus: Secondary | ICD-10-CM | POA: Insufficient documentation

## 2013-02-03 DIAGNOSIS — K219 Gastro-esophageal reflux disease without esophagitis: Secondary | ICD-10-CM | POA: Insufficient documentation

## 2013-02-03 DIAGNOSIS — E669 Obesity, unspecified: Secondary | ICD-10-CM | POA: Insufficient documentation

## 2013-02-03 DIAGNOSIS — Z8639 Personal history of other endocrine, nutritional and metabolic disease: Secondary | ICD-10-CM | POA: Insufficient documentation

## 2013-02-03 DIAGNOSIS — Z862 Personal history of diseases of the blood and blood-forming organs and certain disorders involving the immune mechanism: Secondary | ICD-10-CM | POA: Insufficient documentation

## 2013-02-03 DIAGNOSIS — R5381 Other malaise: Secondary | ICD-10-CM | POA: Insufficient documentation

## 2013-02-03 DIAGNOSIS — R0602 Shortness of breath: Secondary | ICD-10-CM | POA: Insufficient documentation

## 2013-02-03 DIAGNOSIS — Z860100 Personal history of colon polyps, unspecified: Secondary | ICD-10-CM | POA: Insufficient documentation

## 2013-02-03 DIAGNOSIS — R109 Unspecified abdominal pain: Secondary | ICD-10-CM | POA: Insufficient documentation

## 2013-02-03 DIAGNOSIS — Z8719 Personal history of other diseases of the digestive system: Secondary | ICD-10-CM | POA: Insufficient documentation

## 2013-02-03 DIAGNOSIS — Z79899 Other long term (current) drug therapy: Secondary | ICD-10-CM | POA: Insufficient documentation

## 2013-02-03 DIAGNOSIS — I252 Old myocardial infarction: Secondary | ICD-10-CM | POA: Insufficient documentation

## 2013-02-03 DIAGNOSIS — F411 Generalized anxiety disorder: Secondary | ICD-10-CM | POA: Insufficient documentation

## 2013-02-03 DIAGNOSIS — R110 Nausea: Secondary | ICD-10-CM | POA: Insufficient documentation

## 2013-02-03 DIAGNOSIS — I10 Essential (primary) hypertension: Secondary | ICD-10-CM | POA: Insufficient documentation

## 2013-02-03 DIAGNOSIS — K55039 Acute (reversible) ischemia of large intestine, extent unspecified: Secondary | ICD-10-CM

## 2013-02-03 DIAGNOSIS — E785 Hyperlipidemia, unspecified: Secondary | ICD-10-CM | POA: Diagnosis present

## 2013-02-03 DIAGNOSIS — K55059 Acute (reversible) ischemia of intestine, part and extent unspecified: Secondary | ICD-10-CM

## 2013-02-03 DIAGNOSIS — Z7902 Long term (current) use of antithrombotics/antiplatelets: Secondary | ICD-10-CM | POA: Insufficient documentation

## 2013-02-03 DIAGNOSIS — Z7982 Long term (current) use of aspirin: Secondary | ICD-10-CM | POA: Insufficient documentation

## 2013-02-03 DIAGNOSIS — R61 Generalized hyperhidrosis: Secondary | ICD-10-CM | POA: Insufficient documentation

## 2013-02-03 DIAGNOSIS — R079 Chest pain, unspecified: Secondary | ICD-10-CM

## 2013-02-03 DIAGNOSIS — I200 Unstable angina: Secondary | ICD-10-CM | POA: Diagnosis present

## 2013-02-03 LAB — BASIC METABOLIC PANEL
CO2: 27 mEq/L (ref 19–32)
Chloride: 101 mEq/L (ref 96–112)
Creatinine, Ser: 0.97 mg/dL (ref 0.50–1.10)
Sodium: 137 mEq/L (ref 135–145)

## 2013-02-03 LAB — CBC WITH DIFFERENTIAL/PLATELET
Basophils Absolute: 0 10*3/uL (ref 0.0–0.1)
Basophils Relative: 0 % (ref 0–1)
Eosinophils Relative: 1 % (ref 0–5)
Lymphocytes Relative: 16 % (ref 12–46)
MCHC: 35.2 g/dL (ref 30.0–36.0)
MCV: 87.3 fL (ref 78.0–100.0)
Neutro Abs: 6.6 10*3/uL (ref 1.7–7.7)
Platelets: 231 10*3/uL (ref 150–400)
RDW: 13.3 % (ref 11.5–15.5)
WBC: 8.8 10*3/uL (ref 4.0–10.5)

## 2013-02-03 LAB — POCT I-STAT TROPONIN I
Troponin i, poc: 0.02 ng/mL (ref 0.00–0.08)
Troponin i, poc: 0 ng/mL (ref 0.00–0.08)

## 2013-02-03 MED ORDER — AMLODIPINE BESYLATE 2.5 MG PO TABS: 2.5000 mg | ORAL_TABLET | Freq: Every day | ORAL | Status: DC

## 2013-02-03 MED ORDER — NITROGLYCERIN 0.4 MG SL SUBL: 0.4000 mg | SUBLINGUAL_TABLET | SUBLINGUAL | Status: DC | PRN

## 2013-02-03 NOTE — Consult Note (Signed)
Chief Complaint: Angina  HPI:  The patient is a 57 y/o female, followed at [facility] by Dr. Allyson Sabal, with known CAD, who presents back with recurrent chest pain. Over the past several months, she has had multiple hospital admissions for unstable angina and has undergone several ischemic evaluations, including 2 diagnostic LHC and a NST.  She is on near maximum medical therapy. Her last hospitalization for UA was from 6/23- 01/23/13. At that time, she ruled out for MI and Ranexa was added and her Imdur was increased. When she was seen back in follow-up on 6/27, her Ranexa was further increased to 1000 mg BID. The patient states that it was helping significantly, and she had no further chest pain, until this morning. Around 3 AM, she woke up to use the restroom then noticed substernal/left sided chest pain, described as pressure/tight pain. It radiated to the left upper extremity. It was 8/10 and exacerbated by exertion. She had mild SOB. The pain was relived after SL NTG x 2. However, she states that after taking the second dose of NTG, she became nauseated and diaphoretic. No vomiting. She then decided to come to the ER for evaluation. She is currently comfortable and chest pain free.   Past Medical History  Diagnosis Date  . Esophageal stricture   . Duodenitis   . Gastritis   . Tubular adenoma polyp of rectum 2008  . Hypertension     2D ECHO, 09/30/2008 - EF >55%, normal: RENAL DOPPLER, 03/21/2007 - normal duplex study  . GERD (gastroesophageal reflux disease)   . IBS (irritable bowel syndrome)   . Ischemic colitis   . H/O hiatal hernia   . Lower GI bleeding     10/07/11 "I've had 3 episodes in the past year"  . History of esophageal stricture   . Colon polyps   . Myocardial infarction 12/10/2012    "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)  . CAD (coronary artery disease), possible SCAD 12/14/2012  . Dyspnea on exertion     LEXISCAN, 09/30/2008 - normal, EKG negative for ischemia, no ECG  changes  . Hypothyroidism   . Gout     "only from RX given after MI" (01/22/2013)  . Anxiety     Past Surgical History  Procedure Laterality Date  . Abdominal hysterectomy  1999  . Back surgery  09/2008  . Hammer toe surgery Bilateral ~ 2002  . Tonsillectomy and adenoidectomy  1960's  . Esophageal dilation  08/2010  . Peripheral venous study  10/03/2008    No evidence of DVT, superficial thrombosis, or Baker's cyst  . Anterior lumbar fusion  2010    "L4-5" (01/22/2013)  . Tubal ligation  1985  . Esophageal dilation      "once or twice" (01/22/2013)    Family History  Problem Relation Age of Onset  . Colon cancer Brother   . Kidney disease Mother   . Stroke Father   . Heart attack Brother    Social History:  reports that she has never smoked. She has never used smokeless tobacco. She reports that she does not drink alcohol or use illicit drugs.  Allergies: No Known Allergies  Prior to Admission medications   Medication Sig Start Date End Date Taking? Authorizing Provider  ALPRAZolam Prudy Feeler) 0.5 MG tablet Take 0.5 mg by mouth at bedtime as needed for anxiety.    Yes Historical Provider, MD  aspirin EC 81 MG EC tablet Take 1 tablet (81 mg total) by mouth daily. 01/23/13  Yes Brittainy  Simmons, [name]  atorvastatin (LIPITOR) 40 MG tablet Take 1 tablet (40 mg total) by mouth daily at 6 PM. 12/16/12  Yes Brittainy Sharol Harness, [name]  clopidogrel (PLAVIX) 75 MG tablet Take 1 tablet (75 mg total) by mouth daily with breakfast. 12/16/12  Yes Brittainy Simmons, [name]  estradiol (ESTRACE) 0.5 MG tablet Take 0.5 mg by mouth daily.     Yes Historical Provider, MD  isosorbide mononitrate (IMDUR) 30 MG 24 hr tablet Take 2 tablets (60 mg total) by mouth daily. 01/23/13  Yes Brittainy Simmons, [name]  lisinopril (PRINIVIL,ZESTRIL) 2.5 MG tablet Take 2 tablets (5 mg total) by mouth daily. 01/26/13  Yes Wilburt Finlay, [name]  MAGNESIUM PO Take 1 tablet by mouth daily.   Yes Historical Provider, MD  metoprolol  tartrate (LOPRESSOR) 25 MG tablet Take 1 tablet (25 mg total) by mouth 2 (two) times daily. 12/16/12  Yes Brittainy Simmons, [name]  nitroGLYCERIN (NITROSTAT) 0.4 MG SL tablet Place 1 tablet (0.4 mg total) under the tongue every 5 (five) minutes as needed for chest pain. 12/16/12  Yes Brittainy Simmons, [name]  pantoprazole (PROTONIX) 40 MG tablet Take 1 tablet (40 mg total) by mouth 2 (two) times daily. 01/23/13  Yes Brittainy Sharol Harness, [name]  ranolazine (RANEXA) 500 MG 12 hr tablet Take 2 tablets (1,000 mg total) by mouth 2 (two) times daily. 01/26/13  Yes Wilburt Finlay, [name]  Vitamin D, Ergocalciferol, (DRISDOL) 50000 UNITS CAPS Take 1 capsule by mouth 2 (two) times a week. On Sunday and Wednesday 01/01/13  Yes Historical Provider, MD     Results for orders placed during the hospital encounter of 02/03/13 (from the past 48 hour(s))  CBC WITH DIFFERENTIAL     Status: Abnormal   Collection Time    02/03/13  6:26 AM      Result Value Range   WBC 8.8  4.0 - 10.5 K/uL   RBC 4.03  3.87 - 5.11 MIL/uL   Hemoglobin 12.4  12.0 - 15.0 g/dL   HCT 16.1 (*) 09.6 - 04.5 %   MCV 87.3  78.0 - 100.0 fL   MCH 30.8  26.0 - 34.0 pg   MCHC 35.2  30.0 - 36.0 g/dL   RDW 40.9  81.1 - 91.4 %   Platelets 231  150 - 400 K/uL   Neutrophils Relative % 75  43 - 77 %   Neutro Abs 6.6  1.7 - 7.7 K/uL   Lymphocytes Relative 16  12 - 46 %   Lymphs Abs 1.4  0.7 - 4.0 K/uL   Monocytes Relative 8  3 - 12 %   Monocytes Absolute 0.7  0.1 - 1.0 K/uL   Eosinophils Relative 1  0 - 5 %   Eosinophils Absolute 0.1  0.0 - 0.7 K/uL   Basophils Relative 0  0 - 1 %   Basophils Absolute 0.0  0.0 - 0.1 K/uL  BASIC METABOLIC PANEL     Status: Abnormal   Collection Time    02/03/13  6:26 AM      Result Value Range   Sodium 137  135 - 145 mEq/L   Potassium 4.2  3.5 - 5.1 mEq/L   Chloride 101  96 - 112 mEq/L   CO2 27  19 - 32 mEq/L   Glucose, Bld 109 (*) 70 - 99 mg/dL   BUN 13  6 - 23 mg/dL   Creatinine, Ser 7.82  0.50 - 1.10 mg/dL    Calcium 9.2  8.4 - 95.6 mg/dL  GFR calc non Af Amer 64 (*) >90 mL/min   GFR calc Af Amer 74 (*) >90 mL/min   Comment:            The eGFR has been calculated     using the CKD EPI equation.     This calculation has not been     validated in all clinical     situations.     eGFR's persistently     <90 mL/min signify     possible Chronic Kidney Disease.  POCT I-STAT TROPONIN I     Status: None   Collection Time    02/03/13  7:58 AM      Result Value Range   Troponin i, poc 0.02  0.00 - 0.08 ng/mL   Comment 3            Comment: Due to the release kinetics of cTnI,     a negative result within the first hours     of the onset of symptoms does not rule out     myocardial infarction with certainty.     If myocardial infarction is still suspected,     repeat the test at appropriate intervals.  POCT I-STAT TROPONIN I     Status: None   Collection Time    02/03/13 10:07 AM      Result Value Range   Troponin i, poc 0.00  0.00 - 0.08 ng/mL   Comment 3            Comment: Due to the release kinetics of cTnI,     a negative result within the first hours     of the onset of symptoms does not rule out     myocardial infarction with certainty.     If myocardial infarction is still suspected,     repeat the test at appropriate intervals.   Dg Chest 2 View  02/03/2013   *RADIOLOGY REPORT*  Clinical Data: Chest pain woke the patient up at 03:00 a.m. Resolved with nitroglycerin.  Dizziness and nausea.  CHEST - 2 VIEW  Comparison: 12/10/2012  Findings: Shallow inspiration with elevation of the right hemidiaphragm.  Mild cardiac enlargement with normal pulmonary vascularity.  No focal airspace disease or consolidation in the lungs.  No blunting of costophrenic angles.  No pneumothorax. Mediastinal contours appear intact.  No significant changes since previous study.  IMPRESSION: Mild cardiac enlargement.  No evidence of active pulmonary disease.   Original Report Authenticated By: Burman Nieves,  M.D.    Review of Systems  Constitutional: Positive for diaphoresis.  Respiratory: Positive for shortness of breath.   Cardiovascular: Positive for chest pain.  Gastrointestinal: Positive for nausea.  All other systems reviewed and are negative.    Blood pressure 138/82, pulse 65, temperature 98.3 F (36.8 C), temperature source Oral, resp. rate 18, SpO2 95.00%. Physical Exam  Constitutional: She is oriented to person, place, and time. She appears well-developed and well-nourished. No distress.  Neck: No JVD present.  Cardiovascular: Normal rate, regular rhythm, normal heart sounds and intact distal pulses.  Exam reveals no gallop and no friction rub.   No murmur heard. Pulses:      Radial pulses are 2+ on the right side, and 2+ on the left side.       Dorsalis pedis pulses are 2+ on the right side, and 2+ on the left side.  Respiratory: Effort normal and breath sounds normal. No respiratory distress. She has no wheezes. She has no rales.  She exhibits no tenderness.  GI: Soft. Bowel sounds are normal. She exhibits no distension and no mass. There is no tenderness.  Musculoskeletal: She exhibits no edema.  Lymphadenopathy:    She has no cervical adenopathy.  Neurological: She is alert and oriented to person, place, and time.  Skin: Skin is warm and dry. She is not diaphoretic.  Psychiatric: She has a normal mood and affect. Her behavior is normal.     Assessment/Plan Principal Problem:   Unstable angina Active Problems:   HTN (hypertension)   CAD (coronary artery disease), possible SCAD   Hyperlipidemia  Plan: EKG is unchanged. Troponin negative x 2. No chest pain currently. Dr. Allyson Sabal has reviewed old cath films and has discussed options with CT surgeon. Will opt for addition of a CCB, 2.5 mg of Amlodipine daily. Will revaluate in clinic next week with a MLP and with Dr. Allyson Sabal in 4 weeks. If patient has recurrent angina with BB, CCB, Long Acting Nitrate and Ranexa, the next  step will be CABG. Dr. Allyson Sabal has discussed the plan with the patient, who is also in agreement.   Allayne Butcher, [name] 02/03/2013, 10:47 AM   Agree with note written by Boyce Medici  PAC  Known CAD s/p cath 12/15/12 with total LAD after large D1, mod distal AV [place] LCX and ostial PDA with nl LV fxn. On BB, long acting nitrate and Ranexa. Recurrent CP early this am, currently pain free. Exam benign, enz neg, EKG w/o acute changes. I have reviewed cath films with Drs Herbie Baltimore and Laneta Simmers. LAD may be graftable. If recurrent CP despite mac med Rx (will add amlodipine) then pt will be a candidate for CABG. Will arrange OP F/U with MLP then with me afterwards.   [name] 02/03/2013 11:09 AM

## 2013-02-03 NOTE — ED Notes (Signed)
Arrived by EMS when called she reported 6/10 chest pain that woke her up at 3am and she took 2 Ntg SL that resolved her pain and then developed dizziness and nausea after the NTG.  She was in the hospital 1wk ago with an abnormal EKG for observation, 12-15-12 NSTEMI with heart cath patient has a LAD spontaneous dissection and sever 80% Circumflex disease. Ef-50%

## 2013-02-03 NOTE — ED Notes (Signed)
Patient returned from X-ray denies chest pain

## 2013-02-03 NOTE — Discharge Instructions (Signed)
Please read and follow all provided instructions.  Your diagnoses today include:  1. Chest pain     Tests performed today include:  An EKG of your heart  A chest x-ray - no new changes  Cardiac enzymes - a blood test for heart muscle damage  Blood counts and electrolytes    Vital signs. See below for your results today.   Medications prescribed:   Amlodipine - medication for chest pain  Take any prescribed medications only as directed.  Follow-up instructions: Please follow-up with your as instructed by them.   Return instructions:  SEEK IMMEDIATE MEDICAL ATTENTION IF:  You have severe chest pain, especially if the pain is crushing or pressure-like and spreads to the arms, back, neck, or jaw, or if you have sweating, nausea (feeling sick to your stomach), or shortness of breath. THIS IS AN EMERGENCY. Don't wait to see if the pain will go away. Get medical help at once. Call 911 or 0 (operator). DO NOT drive yourself to the hospital.   Your chest pain gets worse and does not go away with rest.   You have an attack of chest pain lasting longer than usual, despite rest and treatment with the medications your caregiver has prescribed.   You wake from sleep with chest pain or shortness of breath.  You feel dizzy or faint.  You have chest pain not typical of your usual pain for which you originally saw your caregiver.   You have any other emergent concerns regarding your health.  Additional Information: Chest pain comes from many different causes. Your caregiver has diagnosed you as having chest pain that is not specific for one problem, but does not require admission.  You are at low risk for an acute heart condition or other serious illness.   Your vital signs today were: BP 138/82   Pulse 65   Temp(Src) 98.3 F (36.8 C) (Oral)   Resp 18   SpO2 95% If your blood pressure (BP) was elevated above 135/85 this visit, please have this repeated by your doctor within one  month. -------------- RESOURCE GUIDE  Chronic Pain Problems:  [facility]:  [phone]  Patients need to be referred by their primary care doctor  Insufficient Money for Medicine:  [employer]:     call "211"  [facility]:  [phone]  No Primary Care Doctor:  To locate a primary care doctor that accepts your insurance or provides certain services:  [facility] :   [phone]  Physician Referral Service:  [phone]  Agencies that provide inexpensive medical care:  [facility]:   [phone]  [facility]:   [phone]  [facility]:   [phone]  Women's Clinic:     [phone]  Planned Parenthood:    [phone]  [facility]:     [phone]  Medicaid-accepting [facility] Providers:  [facility]  [address], [phone], Mon-Fri 9am-7pm, Sat 9am-1pm  [facility]  [address] [place], [address] [place], [phone]  [facility]  [address], [address] [place], [phone]  [facility]  [address], [phone]  Renaye Rakers  [address], [phone]  Only accepts [place] [employer] patients after they have their name  applied to their card  Self Pay (no insurance) in [facility]:  Sickle Cell Patients: Dr. Willey Blade, [facility]  [phone]  Levi Aland, [phone]  [facility]  [address] [place], [phone]  [facility] [place]  [address] [facility] [address], [address], [facility] - [address]  [phone], Mon-Fri 9am-7pm, Sat 9am-1pm  [facility]  [address] [place], [phone]  [facility]  [address] [phone]  [facility]  [address], [phone]  Dr. Julio Sicks  [address], [place], [phone]  [facility]  [address], [phone]  [facility]  [address] [phone]  [address] [phone]  [facility]  [address] [place], [phone], 1st & 3rd Saturday every month, 10am-1pm  Strategies for finding a Primary Care Provider:  1) Find a Doctor and Pay Out of Pocket Although you won't have to find out who is covered by your insurance plan, it is a good idea to ask around and get recommendations. You will then need to call the office and see if the doctor you have chosen will accept you as a new patient and what types of options they offer for patients who are self-pay. Some doctors offer discounts or will set up payment plans for their patients who do not have insurance, but you will need to ask so you aren't surprised when you get to your appointment.  2) Contact Your Local Health Department Not all health departments have doctors that can see patients for sick visits, but many do, so it is worth a call to see if yours does. If you don't know where your local health department is, you can check in your phone book. The CDC also has a tool to help you locate your state's health department, and many state websites also have listings of all of their local health departments.  3) Find a Walk-in Clinic If your illness is not likely to be very severe or complicated, you may want to try a walk in clinic. These are popping up all over the country in pharmacies, drugstores, and shopping centers. They're usually staffed by nurse practitioners or physician assistants that have been trained to treat common illnesses and complaints. They're usually fairly quick and inexpensive. However, if you have serious medical issues or chronic medical problems, these are probably not your best option  STD Testing:  [facility] [place], [place] [facility]  [address], [place], phone [phone] or [phone]    Monday - Friday,  call for an appointment  [facility] of [facility], [place] [facility]  [address], [place], phone [phone] or [phone]   Monday - Friday, call for an appointment  Abuse/Neglect:  [facility] Child Abuse Hotline:  843-658-7265  [facility] Child Abuse Hotline: 416-454-5220 (After Hours)  Emergency Shelter:  [facility] [phone]  Maternity Homes:  [facility] at the [place] [facility]: [phone]  [facility]: [phone])060-5957  MRSA Hotline:   231-092-5667   [facility]  [address]  [phone]   [place]  [address], [place]  [phone]   [facility]  [address], [place]  [phone]   [facility]  [phone]   [facility]  [phone]   [facility] in [place]  [address]  [phone], Insurance   [place] Child Abuse Hotline  2237525856  228-701-3932 (After Hours)   Behavioral Health Services  Substance Abuse Resources:  Alcohol and Drug Services:     [phone]  [facility]:   [phone]  [facility]:     [phone]  [facility]:      [phone]  Residential & Outpatient Substance Abuse Program: [phone]  Psychological Services:  [facility]:   [phone]  [facility] Services:    [phone]  [facility]  [address], [place]  ACCESS LINE: [phone] or [phone]  [employer]  Dental Assistance: If unable to pay or uninsured, contact:  [facility] or [facility]. to become qualified for the adult dental clinic.  Patients with Medicaid  [facility]  [phone] [address], [phone]  [address] [phone]  If unable to pay, or uninsured: contact [facility] [phone]) or [facility] Department [phone] in [place], [phone] in [facility]) to become qualified for the adult dental clinic  Other Low-Cost Community Dental Services:  [facility]  [address] [place], [place], [place], [address]  [phone], Ext. [phone]  2nd and 4th Thursday of the month at [facility]  [facility]  [address] [place], [place], [place], [address]  [phone]  [facility]  [address], [place], [place], [address]  [phone]  [facility] Health Department  [phone]  [facility] Health Department  [phone]  [facility] Health Department  [phone]

## 2013-02-03 NOTE — ED Provider Notes (Signed)
History    CSN: [id number] Arrival date & time     First MD Initiated Contact with Patient 02/03/13 0602     Chief Complaint  Patient presents with  . Chest Pain   (Consider location/radiation/quality/duration/timing/severity/associated sxs/prior Treatment) HPI Comments: Patient with history of CAD, esophageal stricture, gastroesophageal reflux disease, hypertension, hypothyroidism and obesity, prior admission in May 2014 for NSTEMI. Catheterization in 11/2012 revealed diffuse LAD disease. It was decided to treat her medically and patient was prescribed ASA, Plavix, a BB, an ACE-I and Imdur, Ranexa.   Patient presents with recurrent chest pain that began about 3am. It is similar in character to her previous chest pain, described as an ache with associated left arm "weakness". It was associated with shortness of breath, diaphoresis, nausea. Patient took 2 nitroglycerin with moderate relief but not complete resolution of symptoms. Patient was encouraged to come to the hospital by EMS. She denies fever, abdominal pain. Patient continues to feel achiness in her chest but states it is much improved from this morning. The onset of this condition was acute. The course is constant. Aggravating factors: none. Alleviating factors: none.       The history is provided by the patient and medical records.   Past Medical History  Diagnosis Date  . Esophageal stricture   . Duodenitis   . Gastritis   . Tubular adenoma polyp of rectum 2008  . Hypertension     2D ECHO, 09/30/2008 - EF >55%, normal: RENAL DOPPLER, 03/21/2007 - normal duplex study  . GERD (gastroesophageal reflux disease)   . IBS (irritable bowel syndrome)   . Ischemic colitis   . H/O hiatal hernia   . Lower GI bleeding     10/07/11 "I've had 3 episodes in the past year"  . History of esophageal stricture   . Colon polyps   . Myocardial infarction 12/10/2012    "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)  . CAD (coronary  artery disease), possible SCAD 12/14/2012  . Dyspnea on exertion     LEXISCAN, 09/30/2008 - normal, EKG negative for ischemia, no ECG changes  . Hypothyroidism   . Gout     "only from RX given after MI" (01/22/2013)  . Anxiety    Past Surgical History  Procedure Laterality Date  . Abdominal hysterectomy  1999  . Back surgery  09/2008  . Hammer toe surgery Bilateral ~ 2002  . Tonsillectomy and adenoidectomy  1960's  . Esophageal dilation  08/2010  . Peripheral venous study  10/03/2008    No evidence of DVT, superficial thrombosis, or Baker's cyst  . Anterior lumbar fusion  2010    "L4-5" (01/22/2013)  . Tubal ligation  1985  . Esophageal dilation      "once or twice" (01/22/2013)   Family History  Problem Relation Age of Onset  . Colon cancer Brother   . Kidney disease Mother   . Stroke Father   . Heart attack Brother    History  Substance Use Topics  . Smoking status: Never Smoker   . Smokeless tobacco: Never Used  . Alcohol Use: No   OB History   Grav Para Term Preterm Abortions TAB SAB Ect Mult Living                 Review of Systems  Constitutional: Positive for diaphoresis. Negative for fever.  HENT: Negative for neck pain.   Eyes: Negative for redness.  Respiratory: Positive for shortness of breath. Negative for cough.   Cardiovascular: Positive  for chest pain. Negative for palpitations and leg swelling.  Gastrointestinal: Positive for nausea. Negative for vomiting and abdominal pain.  Genitourinary: Negative for dysuria.  Musculoskeletal: Negative for back pain.  Skin: Negative for rash.  Neurological: Negative for syncope and light-headedness.    Allergies  Review of patient's allergies indicates no known allergies.  Home Medications   Current Outpatient Rx  Name  Route  Sig  Dispense  Refill  . ALPRAZolam (XANAX) 0.5 MG tablet   Oral   Take 0.5 mg by mouth at bedtime as needed for anxiety.          Marland Kitchen aspirin EC 81 MG EC tablet   Oral   Take 1  tablet (81 mg total) by mouth daily.         Marland Kitchen atorvastatin (LIPITOR) 40 MG tablet   Oral   Take 1 tablet (40 mg total) by mouth daily at 6 PM.   60 tablet   5   . clopidogrel (PLAVIX) 75 MG tablet   Oral   Take 1 tablet (75 mg total) by mouth daily with breakfast.   30 tablet   5   . estradiol (ESTRACE) 0.5 MG tablet   Oral   Take 0.5 mg by mouth daily.           . isosorbide mononitrate (IMDUR) 30 MG 24 hr tablet   Oral   Take 2 tablets (60 mg total) by mouth daily.   30 tablet   5   . lisinopril (PRINIVIL,ZESTRIL) 2.5 MG tablet   Oral   Take 2 tablets (5 mg total) by mouth daily.   30 tablet   5     Resend: Pharmacy misplaced Rx.   Marland Kitchen MAGNESIUM PO   Oral   Take 1 tablet by mouth daily.         . metoprolol tartrate (LOPRESSOR) 25 MG tablet   Oral   Take 1 tablet (25 mg total) by mouth 2 (two) times daily.   60 tablet   5   . nitroGLYCERIN (NITROSTAT) 0.4 MG SL tablet   Sublingual   Place 1 tablet (0.4 mg total) under the tongue every 5 (five) minutes as needed for chest pain.   25 tablet   5   . pantoprazole (PROTONIX) 40 MG tablet   Oral   Take 1 tablet (40 mg total) by mouth 2 (two) times daily.   30 tablet   5   . ranolazine (RANEXA) 500 MG 12 hr tablet   Oral   Take 2 tablets (1,000 mg total) by mouth 2 (two) times daily.   112 tablet   0   . Vitamin D, Ergocalciferol, (DRISDOL) 50000 UNITS CAPS   Oral   Take 1 capsule by mouth 2 (two) times a week.          BP 138/82  Pulse 65  Temp(Src) 98.3 F (36.8 C) (Oral)  Resp 18  SpO2 95% Physical Exam  Nursing note and vitals reviewed. Constitutional: She appears well-developed and well-nourished.  HENT:  Head: Normocephalic and atraumatic.  Mouth/Throat: Mucous membranes are normal. Mucous membranes are not dry.  Eyes: Conjunctivae are normal.  Neck: Trachea normal and normal range of motion. Neck supple. Normal carotid pulses and no JVD present. No muscular tenderness present.  Carotid bruit is not present. No tracheal deviation present.  Cardiovascular: Normal rate, regular rhythm, S1 normal, S2 normal, normal heart sounds and intact distal pulses.  Exam reveals no decreased pulses.   No  murmur heard. Pulmonary/Chest: Effort normal. No respiratory distress. She has no wheezes. She exhibits no tenderness.  Abdominal: Soft. Normal aorta and bowel sounds are normal. There is no tenderness. There is no rebound and no guarding.  Musculoskeletal: Normal range of motion.  Neurological: She is alert.  Skin: Skin is warm and dry. She is not diaphoretic. No cyanosis. No pallor.  Psychiatric: She has a normal mood and affect.    ED Course  Procedures (including critical care time) Labs Reviewed  CBC WITH DIFFERENTIAL - Abnormal; Notable for the following:    HCT 35.2 (*)    All other components within normal limits  BASIC METABOLIC PANEL - Abnormal; Notable for the following:    Glucose, Bld 109 (*)    GFR calc non Af Amer 64 (*)    GFR calc Af Amer 74 (*)    All other components within normal limits  POCT I-STAT TROPONIN I  POCT I-STAT TROPONIN I   Dg Chest 2 View  02/03/2013   *RADIOLOGY REPORT*  Clinical Data: Chest pain woke the patient up at 03:00 a.m. Resolved with nitroglycerin.  Dizziness and nausea.  CHEST - 2 VIEW  Comparison: 12/10/2012  Findings: Shallow inspiration with elevation of the right hemidiaphragm.  Mild cardiac enlargement with normal pulmonary vascularity.  No focal airspace disease or consolidation in the lungs.  No blunting of costophrenic angles.  No pneumothorax. Mediastinal contours appear intact.  No significant changes since previous study.  IMPRESSION: Mild cardiac enlargement.  No evidence of active pulmonary disease.   Original Report Authenticated By: Burman Nieves, M.D.   1. Chest pain     6:32 AM Patient seen and examined. Work-up initiated. Medications ordered.   Vital signs reviewed and are as follows: Filed Vitals:    02/03/13 0628  BP:   Pulse: 65  Temp:   Resp: 17  BP 142/80  Pulse 65  Temp(Src) 98.3 F (36.8 C) (Oral)  Resp 17  SpO2 97%   Date: 02/03/2013  Rate: 65  Rhythm: normal sinus rhythm  QRS Axis: normal  Intervals: normal  ST/T Wave abnormalities: nonspecific T wave changes  Conduction Disutrbances:none  Narrative Interpretation:   Old EKG Reviewed: unchanged from 01/23/13  8:20 AM Patient states ache in chest is 2/10. I spoke with [facility] who will see. Patient states she does not want to stay in the hospital. Will check 2nd troponin at 0930.   11:16 AM 2nd trop neg. Dr. Allyson Sabal and Sharol Harness [name] have seen patient.   Patient OK for d/c. Will start 2.5mg  amlodipine at request of cards. Pt to f/u as outpatient. Discuss possible CABG.   Patient was counseled to return with severe chest pain, especially if the pain is crushing or pressure-like and spreads to the arms, back, neck, or jaw, or if they have sweating, nausea, or shortness of breath with the pain. They were encouraged to call 911 with these symptoms.   They were also told to return if their chest pain gets worse and does not go away with rest, they have an attack of chest pain lasting longer than usual despite rest and treatment with the medications their caregiver has prescribed, if they wake from sleep with chest pain or shortness of breath, if they feel dizzy or faint, if they have chest pain not typical of their usual pain, or if they have any other emergent concerns regarding their health.  The patient verbalized understanding and agreed.     MDM  CP in patient  with known CAD. She is almost on maximal medical therapy. Her cardiologist has seen her in ED. D/c to home with outpt follow-up. EKG is unchanged, two trop neg.      Renne Crigler, [name] 02/03/13 1120

## 2013-02-04 NOTE — ED Provider Notes (Signed)
Medical screening examination/treatment/procedure(s) were performed by non-physician practitioner and as supervising physician I was immediately available for consultation/collaboration.    Vida Roller, MD 02/04/13 [phone]

## 2013-02-05 ENCOUNTER — Telehealth: Payer: Self-pay | Admitting: Cardiovascular Disease

## 2013-02-05 NOTE — Telephone Encounter (Signed)
Pt has appt tomorrow.  Will defer to be addressed then.

## 2013-02-05 NOTE — Telephone Encounter (Signed)
Pt called stating that she needs a prior authorization called in to [facility] for medication. The pharmacy is stating that they can not do anything. [employer] [phone].

## 2013-02-06 ENCOUNTER — Ambulatory Visit (INDEPENDENT_AMBULATORY_CARE_PROVIDER_SITE_OTHER): Payer: Federal, State, Local not specified - PPO | Admitting: Cardiology

## 2013-02-06 ENCOUNTER — Encounter: Payer: Self-pay | Admitting: Cardiology

## 2013-02-06 VITALS — BP 130/88 | HR 63 | Ht 63.0 in | Wt 197.8 lb

## 2013-02-06 DIAGNOSIS — K219 Gastro-esophageal reflux disease without esophagitis: Secondary | ICD-10-CM

## 2013-02-06 DIAGNOSIS — R079 Chest pain, unspecified: Secondary | ICD-10-CM

## 2013-02-06 DIAGNOSIS — E039 Hypothyroidism, unspecified: Secondary | ICD-10-CM

## 2013-02-06 DIAGNOSIS — E785 Hyperlipidemia, unspecified: Secondary | ICD-10-CM

## 2013-02-06 DIAGNOSIS — I200 Unstable angina: Secondary | ICD-10-CM

## 2013-02-06 DIAGNOSIS — I2510 Atherosclerotic heart disease of native coronary artery without angina pectoris: Secondary | ICD-10-CM

## 2013-02-06 MED ORDER — PANTOPRAZOLE SODIUM 40 MG PO TBEC: 40.0000 mg | DELAYED_RELEASE_TABLET | Freq: Two times a day (BID) | ORAL | Status: DC

## 2013-02-06 NOTE — Addendum Note (Signed)
Addended by: Leone Brand on: 02/06/2013 02:25 PM   Modules accepted: Orders

## 2013-02-06 NOTE — Assessment & Plan Note (Signed)
Treated. Will need follow up lipid panel.

## 2013-02-06 NOTE — Assessment & Plan Note (Addendum)
norvasc added after ER visit 02/03/13.  Diffuse CAD 3 vessel though culprit vessel is LAD.  Treating medically.

## 2013-02-06 NOTE — Patient Instructions (Addendum)
We will call for Protonix.  Try colace a stool softener over the counter daily or Probiotic ex. : Align.but there are several others.  Call if any chest pain.  Follow up in 1-2 weeks for BP check.    I checked with Dr. Allyson Sabal, [facility] to travel, but take frequent rest breaks and I would take someone with you.

## 2013-02-06 NOTE — Assessment & Plan Note (Signed)
Stable

## 2013-02-06 NOTE — Assessment & Plan Note (Signed)
Significant reflux with brash in throat.  [employer] needs pre authorization for protonix.  Will call now.  She needs her ASA and Plavix and with out protonix twice a day she has GERD.

## 2013-02-06 NOTE — Progress Notes (Signed)
02/06/2013   PCP: Pcp Not In System   Chief Complaint  Patient presents with  . Follow-up    post hospital for tightness in chest on friday night, chest pain and S.O.B, dizziness and lightheaded, patient stated she is doing pretty well now just nauseated    Primary Cardiologist: Dr. 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  She also has RCA disease of 60% distally and 40-50% mid.  LCX with 70-80% stenosis of AV groove.   After discussion the best course of action would be medical therapy.  This past weekend pt was seen in the ER for chest pain.  Enzymes were negative.  Dr. Allyson Sabal reviewed films with Dr. Laneta Simmers and if medical therapy fails then LAD may be graftable. If recurrent CP despite mac med Rx (will add amlodipine) then pt will be a candidate for CABG.  Today she has no angina but is positive for reflux.  She is upset that our office has not yet called for prior authorization. We will call this AM.    She denies SOB or angina.  [facility] And [place].       No Known Allergies  Current Outpatient Prescriptions  Medication Sig Dispense Refill  . ALPRAZolam (XANAX) 0.5 MG tablet Take 0.5 mg by mouth at bedtime as needed for anxiety.       Marland Kitchen amLODipine (NORVASC) 2.5 MG tablet Take 1 tablet (2.5 mg total) by mouth daily.  30 tablet  0  . aspirin EC 81 MG EC tablet Take 1 tablet (81 mg total) by mouth daily.      Marland Kitchen atorvastatin (LIPITOR) 40 MG tablet Take 1 tablet (40 mg total) by mouth daily at 6 PM.  60 tablet  5  . clopidogrel (PLAVIX) 75 MG tablet Take 1 tablet (75 mg total) by mouth daily with breakfast.  30 tablet  5  . estradiol (ESTRACE) 0.5 MG tablet Take 0.5 mg by mouth daily.        . isosorbide mononitrate (IMDUR) 30 MG 24 hr tablet Take 2 tablets (60 mg  total) by mouth daily.  30 tablet  5  . lisinopril (PRINIVIL,ZESTRIL) 2.5 MG tablet Take 2 tablets (5 mg total) by mouth daily.  30 tablet  5  . MAGNESIUM PO Take 1 tablet by mouth daily.      . metoprolol tartrate (LOPRESSOR) 25 MG tablet Take 1 tablet (25 mg total) by mouth 2 (two) times daily.  60 tablet  5  . nitroGLYCERIN (NITROSTAT) 0.4 MG SL tablet Place 1 tablet (0.4 mg total) under the tongue every 5 (five) minutes as needed for chest pain.  25 tablet  5  . pantoprazole (PROTONIX) 40 MG tablet Take 1 tablet (40 mg total) by mouth 2 (two) times daily.  30 tablet  5  . ranolazine (RANEXA) 500 MG 12 hr tablet Take 2 tablets (1,000 mg total) by mouth 2 (two) times daily.  112 tablet  0  . Vitamin D, Ergocalciferol, (DRISDOL) 50000 UNITS CAPS Take 1 capsule by mouth 2 (two) times a week. On Sunday and Wednesday      . [DISCONTINUED] phentermine 15 MG capsule Take 15 mg by mouth every morning.         No current facility-administered medications for this visit.  Past Medical History  Diagnosis Date  . Esophageal stricture   . Duodenitis   . Gastritis   . Tubular adenoma polyp of rectum 2008  . Hypertension     2D ECHO, 09/30/2008 - EF >55%, normal: RENAL DOPPLER, 03/21/2007 - normal duplex study  . GERD (gastroesophageal reflux disease)   . IBS (irritable bowel syndrome)   . Ischemic colitis   . H/O hiatal hernia   . Lower GI bleeding     10/07/11 "I've had 3 episodes in the past year"  . History of esophageal stricture   . Colon polyps   . Myocardial infarction 12/10/2012    "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)  . CAD (coronary artery disease), possible SCAD 12/14/2012  . Dyspnea on exertion     LEXISCAN, 09/30/2008 - normal, EKG negative for ischemia, no ECG changes  . Hypothyroidism   . Gout     "only from RX given after MI" (01/22/2013)  . Anxiety     Past Surgical History  Procedure Laterality Date  . Abdominal hysterectomy  1999  . Back surgery  09/2008  .  Hammer toe surgery Bilateral ~ 2002  . Tonsillectomy and adenoidectomy  1960's  . Esophageal dilation  08/2010  . Peripheral venous study  10/03/2008    No evidence of DVT, superficial thrombosis, or Baker's cyst  . Anterior lumbar fusion  2010    "L4-5" (01/22/2013)  . Tubal ligation  1985  . Esophageal dilation      "once or twice" (01/22/2013)    EXB:MWUXLKG:MW colds or fevers, no weight changes Skin:no rashes or ulcers HEENT:no blurred vision, no congestion CV:see HPI PUL:see HPI GI:no diarrhea constipation or melena, +reflux, up last night with reflux. GU:no hematuria, no dysuria MS:no joint pain, no claudication Neuro:no syncope, no lightheadedness Endo:no diabetes, no thyroid disease  PHYSICAL EXAM BP 130/88  Pulse 63  Ht 5\' 3"  (1.6 m)  Wt 197 lb 12.8 oz (89.721 kg)  BMI 35.05 kg/m2 General:Pleasant affect, NAD Skin:Warm and dry, brisk capillary refill HEENT:normocephalic, sclera clear, mucus membranes moist Neck:supple, no JVD, no bruits  Heart:S1S2 RRR without murmur, gallup, rub or click Lungs:clear without rales, rhonchi, or wheezes NUU:VOZD, non tender, + BS, do not palpate liver spleen or masses Ext:no lower ext edema, 2+ pedal pulses, 2+ radial pulses Neuro:alert and oriented, MAE, follows commands, + facial symmetry  EKG:SR Q wave V1-3 old. No changes from EKG 02/03/13.  ASSESSMENT AND PLAN Unstable angina Seen in ED 02/03/13 with angina.  Dr. Allyson Sabal discussed coronary films with Dr. Laneta Simmers and if pt fails medical therapy then she would be a candidate for CABG.  Norvasc added to meds.  No angina today or since ER visit.Marland Kitchen   CAD (coronary artery disease), possible SCAD norvasc added after ER visit 02/03/13.  Diffuse CAD 3 vessel though culprit vessel is LAD.  Treating medically.   GERD Significant reflux with brash in throat.  [employer] needs pre authorization for protonix.  Will call now.  She needs her ASA and Plavix and with out protonix twice a day she has  GERD.  Hyperlipidemia Treated. Will need follow up lipid panel.  Hypothyroid:   Stable    Ok to travel, requested pt stop and walk every 1.5 hours. I also asked her to take someone with her. Discussed with Dr. Allyson Sabal.

## 2013-02-06 NOTE — Assessment & Plan Note (Addendum)
Seen in ED 02/03/13 with angina.  Dr. Allyson Sabal discussed coronary films with Dr. Laneta Simmers and if pt fails medical therapy then she would be a candidate for CABG.  Norvasc added to meds.  No angina today or since ER visit.Marland Kitchen

## 2013-02-08 ENCOUNTER — Telehealth: Payer: Self-pay | Admitting: Cardiovascular Disease

## 2013-02-08 NOTE — Telephone Encounter (Signed)
Heran is calling to g ive you the phone number that you were needing .[facility] [employer] [facility] .Marland KitchenMarland [phone] option 2 then option 0   Thanks

## 2013-02-09 NOTE — Telephone Encounter (Signed)
Returned call.  Pt informed per NP and verbalized understanding.

## 2013-02-09 NOTE — Telephone Encounter (Signed)
i had already called in for prior authorization and it was approved for Protonix BID.

## 2013-02-28 ENCOUNTER — Encounter: Payer: Self-pay | Admitting: Cardiovascular Disease

## 2013-02-28 ENCOUNTER — Ambulatory Visit (INDEPENDENT_AMBULATORY_CARE_PROVIDER_SITE_OTHER): Payer: Federal, State, Local not specified - PPO | Admitting: Cardiovascular Disease

## 2013-02-28 VITALS — BP 122/88 | HR 60 | Ht 63.0 in | Wt 197.4 lb

## 2013-02-28 DIAGNOSIS — K55039 Acute (reversible) ischemia of large intestine, extent unspecified: Secondary | ICD-10-CM

## 2013-02-28 DIAGNOSIS — R079 Chest pain, unspecified: Secondary | ICD-10-CM

## 2013-02-28 DIAGNOSIS — E785 Hyperlipidemia, unspecified: Secondary | ICD-10-CM

## 2013-02-28 DIAGNOSIS — I2510 Atherosclerotic heart disease of native coronary artery without angina pectoris: Secondary | ICD-10-CM

## 2013-02-28 DIAGNOSIS — I10 Essential (primary) hypertension: Secondary | ICD-10-CM

## 2013-02-28 DIAGNOSIS — K55059 Acute (reversible) ischemia of intestine, part and extent unspecified: Secondary | ICD-10-CM

## 2013-02-28 DIAGNOSIS — Z79899 Other long term (current) drug therapy: Secondary | ICD-10-CM

## 2013-02-28 MED ORDER — AMLODIPINE BESYLATE 2.5 MG PO TABS: 2.5000 mg | ORAL_TABLET | Freq: Every day | ORAL | Status: DC

## 2013-02-28 NOTE — Assessment & Plan Note (Signed)
On statin therapy. She is still not at goal with an LDL greater than 100. We will repeat her lipid and liver profile and make further recommendations based on those results. Target is LDL less than 70.

## 2013-02-28 NOTE — Progress Notes (Signed)
02/28/2013 [name]   01/08/1956  [id number]  Primary Physician Pcp Not In System Primary Cardiologist: [name] MD [name]   HPI:  The patient is a 57 y.o. female, followed at [facility] by Dr. Allyson Sabal, with a history of HTN, obesity, and hypothyroidism who presented on 12/11/12 after a prolonged episode of angina. Her EKG was without acute changes, but she ruled in for NSTEMI. Her troponin peaked at 4.95. She was placed on IV Heparin and underwent diagnostic coronary angiography. Her initial cardiac catheterization on 5/11, performed by Dr. Herbie Baltimore, demonstrated diffuse LAD disease with essentially subtotal occlusion that had the appearance of possible Spontaneous Coronary Artery Dissection. However, with existing CAD, simply diffuse disease was also possible. It was decided to not do any intervention at that time, but to wait and have the patient return, several days later, for a re-look cath to re-evaluate the LAD. She was maintained on medical therapy and had improvement in symtpoms. She returned to the cath lab on 5/16 for re-look. This was also performed by Dr. Herbie Baltimore. She was noted to have progression of LAD disease to total occlusion at the original ~95% subtotal location with improved D1-distal LAD and R-L collaterals (septal and distal RPDA). The images were reviewd by Dr. Allyson Sabal and Dr. Riley Kill and it was concluded that the best course of action was not to proceed with extensive LAD PTCA-PCI, in the absence of on-going symptoms. Medical therapy was recommended. It was decided to start Plavix, increase her BB and to continue on her ACE-I and Imdur. She left the cath lab in stable condition. She had no post-operative complications. Both catheterizations were preformed via the right radial artery. The access site remained stable. She had no further chest pain. Cardiac Rehab was consulted to assess the patient and to assist with ambulation. The patient denied exertional  angina. She was last seen and examined by Dr. Royann Shivers, who determined she was stable for discharge home. Dr. Herbie Baltimore had recommended having the patient undergo a nuclear perfusion stress test to assess for significant anterior ischemia. This has been arranged at [facility]. She is scheduled to follow-up at [facility] on 5/29. The patient had a Myoview stress test performed in the office that showed scar in the LAD territory without ischemia. She remains asymptomatic on maximal antianginal medications.    Current Outpatient Prescriptions  Medication Sig Dispense Refill  . ALPRAZolam (XANAX) 0.5 MG tablet Take 0.5 mg by mouth at bedtime as needed for anxiety.       Marland Kitchen amLODipine (NORVASC) 2.5 MG tablet Take 1 tablet (2.5 mg total) by mouth daily.  30 tablet  0  . aspirin EC 81 MG EC tablet Take 1 tablet (81 mg total) by mouth daily.      Marland Kitchen atorvastatin (LIPITOR) 40 MG tablet Take 1 tablet (40 mg total) by mouth daily at 6 PM.  60 tablet  5  . clopidogrel (PLAVIX) 75 MG tablet Take 1 tablet (75 mg total) by mouth daily with breakfast.  30 tablet  5  . estradiol (ESTRACE) 0.5 MG tablet Take 0.5 mg by mouth daily.        . isosorbide mononitrate (IMDUR) 30 MG 24 hr tablet Take 2 tablets (60 mg total) by mouth daily.  30 tablet  5  . lisinopril (PRINIVIL,ZESTRIL) 2.5 MG tablet Take 2 tablets (5 mg total) by mouth daily.  30 tablet  5  . MAGNESIUM PO Take 1 tablet by mouth daily.      Marland Kitchen  metoprolol tartrate (LOPRESSOR) 25 MG tablet Take 1 tablet (25 mg total) by mouth 2 (two) times daily.  60 tablet  5  . nitroGLYCERIN (NITROSTAT) 0.4 MG SL tablet Place 1 tablet (0.4 mg total) under the tongue every 5 (five) minutes as needed for chest pain.  25 tablet  5  . pantoprazole (PROTONIX) 40 MG tablet Take 1 tablet (40 mg total) by mouth 2 (two) times daily.  60 tablet  3  . ranolazine (RANEXA) 1000 MG SR tablet Take 1,000 mg by mouth 2 (two) times daily.      . Vitamin D, Ergocalciferol, (DRISDOL) 50000 UNITS CAPS Take 1  capsule by mouth 2 (two) times a week. On Sunday and Wednesday      . [DISCONTINUED] phentermine 15 MG capsule Take 15 mg by mouth every morning.         No current facility-administered medications for this visit.    No Known Allergies  History   Social History  . Marital Status: Single    Spouse Name: N/A    Number of Children: 2  . Years of Education: N/A   Occupational History  . Drug Programmer, [employer]   Social History Main Topics  . Smoking status: Never Smoker   . Smokeless tobacco: Never Used  . Alcohol Use: No  . Drug Use: No  . Sexually Active: No   Other Topics Concern  . Not on file   Social History Narrative   Daily caffeine use.     Review of Systems: General: negative for chills, fever, night sweats or weight changes.  Cardiovascular: negative for chest pain, dyspnea on exertion, edema, orthopnea, palpitations, paroxysmal nocturnal dyspnea or shortness of breath Dermatological: negative for rash Respiratory: negative for cough or wheezing Urologic: negative for hematuria Abdominal: negative for nausea, vomiting, diarrhea, bright red blood per rectum, melena, or hematemesis Neurologic: negative for visual changes, syncope, or dizziness All other systems reviewed and are otherwise negative except as noted above.    Blood pressure 122/88, pulse 60, height 5\' 3"  (1.6 m), weight 197 lb 6.4 oz (89.54 kg).  General appearance: alert and no distress Neck: no adenopathy, no carotid bruit, no JVD, supple, symmetrical, trachea midline and thyroid not enlarged, symmetric, no tenderness/mass/nodules Lungs: clear to auscultation bilaterally Heart: regular rate and rhythm, S1, S2 normal, no murmur, click, rub or gallop Extremities: extremities normal, atraumatic, no cyanosis or edema  EKG normal sinus rhythm at 60 with septal Q waves  ASSESSMENT AND PLAN:   CAD (coronary artery disease), possible SCAD She has known CAD with LAD occlusion  presumably related to spontaneous coronary artery dissection (SCAD). She has preserved left jugular function and a Myoview stress test performed 12/28/12 that showed scar in the LAD territory without ischemia. I did review her films with Dr. Rexanne Mano regarding suitability for surgical revascularization. The decision was to treat her medically she was recalcitrant to antianginal medication. She is on maximal antianginals and currently is pain-free. Plans will be for continued medical therapy.  Hyperlipidemia On statin therapy. She is still not at goal with an LDL greater than 100. We will repeat her lipid and liver profile and make further recommendations based on those results. Target is LDL less than 70.      [name] MD FACP,FACC,FAHA, [facility] 02/28/2013 9:36 AM

## 2013-02-28 NOTE — Assessment & Plan Note (Signed)
She has known CAD with LAD occlusion presumably related to spontaneous coronary artery dissection (SCAD). She has preserved left jugular function and a Myoview stress test performed 12/28/12 that showed scar in the LAD territory without ischemia. I did review her films with Dr. Rexanne Mano regarding suitability for surgical revascularization. The decision was to treat her medically she was recalcitrant to antianginal medication. She is on maximal antianginals and currently is pain-free. Plans will be for continued medical therapy.

## 2013-02-28 NOTE — Patient Instructions (Signed)
Your physician recommends that you schedule a follow-up appointment in: 3 months with laura  Your physician recommends that you schedule a follow-up appointment in: 6 month with Dr Allyson Sabal  Your physician recommends that you return for lab work: CMP, LIPIDS

## 2013-03-05 ENCOUNTER — Telehealth: Payer: Self-pay | Admitting: Cardiovascular Disease

## 2013-03-05 MED ORDER — AMLODIPINE BESYLATE 2.5 MG PO TABS: 2.5000 mg | ORAL_TABLET | Freq: Every day | ORAL | Status: DC

## 2013-03-05 NOTE — Telephone Encounter (Signed)
Pharmacist says she need new prescription from Dr Allyson Sabal for her Norvasc 2.5mg  #60.Please call to [facility]-[phone]

## 2013-03-05 NOTE — Telephone Encounter (Signed)
Rx inadvertently printed.  Returned call to pt and informed pt.  Also informed will be sent electronically.  Pt verbalized understanding and agreed w/ plan.    Refill(s) sent to pharmacy for 90-day supply per pt request.  Will call back if problems.

## 2013-03-08 ENCOUNTER — Telehealth: Payer: Self-pay | Admitting: Cardiovascular Disease

## 2013-03-08 MED ORDER — RANOLAZINE ER 1000 MG PO TB12: 1000.0000 mg | ORAL_TABLET | Freq: Two times a day (BID) | ORAL | Status: DC

## 2013-03-08 MED ORDER — ISOSORBIDE MONONITRATE ER 30 MG PO TB24: 30.0000 mg | ORAL_TABLET | Freq: Two times a day (BID) | ORAL | Status: DC

## 2013-03-08 MED ORDER — LISINOPRIL 2.5 MG PO TABS: 5.0000 mg | ORAL_TABLET | Freq: Two times a day (BID) | ORAL | Status: DC

## 2013-03-08 NOTE — Telephone Encounter (Signed)
Patient is taking Lisinopril 2.5 bid and Isosorbide 30 mg bid but her Rx is only for 30 pills.  Wants to know if she can get 60.  She also needs Renexa 1000 mg. [facility] pharmacy.

## 2013-03-08 NOTE — Telephone Encounter (Signed)
Returned call and reviewed meds w/ pt.  Updated Rxs and Refill(s) sent to pharmacy.

## 2013-03-30 ENCOUNTER — Telehealth: Payer: Self-pay | Admitting: Cardiovascular Disease

## 2013-03-30 NOTE — Telephone Encounter (Signed)
Having problems with her allergies-wants to know what she can take along with all the other medicine she is on?

## 2013-03-30 NOTE — Telephone Encounter (Signed)
Advised patient not to take anything with a decongestant in it.  She verbalized understanding.

## 2013-04-25 ENCOUNTER — Telehealth: Payer: Self-pay | Admitting: Cardiovascular Disease

## 2013-04-25 NOTE — Telephone Encounter (Signed)
Patient has "meesed up" on taking her medications.  Please advise.

## 2013-04-25 NOTE — Telephone Encounter (Signed)
Dr. Royann Shivers notified and advised pt should be on clopidogrel for 12 months after MI (May 2015).  Advised pt should restart taking clopidogrel at 1 tablet daily, not necessary to reload dose.    Returned call and informed pt per instructions by MD/PA.  Pt verbalized understanding and agreed w/ plan.  Pt also informed Dr. Allyson Sabal will be notified as well.  Message forwarded to Dr. Allyson Sabal [facility]).

## 2013-04-25 NOTE — Telephone Encounter (Signed)
Dr. Allyson Sabal out of the office.  Message forwarded to Hinda Glatter, [name] for further instructions.

## 2013-04-25 NOTE — Telephone Encounter (Signed)
Returned call.  Pt stated she was supposed to be taking Plavix.  Stated she got mixed up and the last refill was in June.  Pt wanted to know if she should just restart it.  Pt stated she thought maybe that had something to do with the few episodes of not feeling well.  Pt c/o "heart not beating right and dizziness."  Pt informed Dr. Allyson Sabal will be notified for further instructions.  Pt verbalized understanding and agreed w/ plan.  Message forwarded to Dr. Allyson Sabal for review and further instructions.  This note on Dr. Hazle Coca cart for review.

## 2013-05-16 ENCOUNTER — Ambulatory Visit: Payer: Federal, State, Local not specified - PPO | Admitting: Cardiology

## 2013-05-22 ENCOUNTER — Encounter: Payer: Self-pay | Admitting: Cardiology

## 2013-05-22 ENCOUNTER — Ambulatory Visit (INDEPENDENT_AMBULATORY_CARE_PROVIDER_SITE_OTHER): Payer: Federal, State, Local not specified - PPO | Admitting: Cardiology

## 2013-05-22 VITALS — BP 110/80 | HR 68 | Ht 63.0 in | Wt 204.7 lb

## 2013-05-22 DIAGNOSIS — I208 Other forms of angina pectoris: Secondary | ICD-10-CM

## 2013-05-22 DIAGNOSIS — I2089 Other forms of angina pectoris: Secondary | ICD-10-CM

## 2013-05-22 DIAGNOSIS — E669 Obesity, unspecified: Secondary | ICD-10-CM

## 2013-05-22 DIAGNOSIS — I2510 Atherosclerotic heart disease of native coronary artery without angina pectoris: Secondary | ICD-10-CM

## 2013-05-22 DIAGNOSIS — E785 Hyperlipidemia, unspecified: Secondary | ICD-10-CM

## 2013-05-22 DIAGNOSIS — I10 Essential (primary) hypertension: Secondary | ICD-10-CM

## 2013-05-22 DIAGNOSIS — I209 Angina pectoris, unspecified: Secondary | ICD-10-CM

## 2013-05-22 HISTORY — DX: Other forms of angina pectoris: I20.89

## 2013-05-22 HISTORY — DX: Other forms of angina pectoris: I20.8

## 2013-05-22 NOTE — Assessment & Plan Note (Signed)
Controlled.  

## 2013-05-22 NOTE — Progress Notes (Signed)
05/22/2013   PCP: Pcp Not In System   Chief Complaint  Patient presents with  . 3 month visit    no chest pain ,no sob ,occ. edema @2  weeks ago became real sick at night time nauseated , dizziness swelling 3-4 noghts ina row    Primary Cardiologist: Dr. Allyson Sabal  HPI:  57 y.o. female, followed at [facility] by Dr. Allyson Sabal, with a history of HTN, obesity, and hypothyroidism who presented on 12/11/12 after a prolonged episode of angina. Her EKG was without acute changes, but she ruled in for NSTEMI. Her troponin peaked at 4.95. She was placed on IV Heparin and underwent diagnostic coronary angiography. Her initial cardiac catheterization on 5/11, performed by Dr. Herbie Baltimore, demonstrated diffuse LAD disease with essentially subtotal occlusion that had the appearance of possible Spontaneous Coronary Artery Dissection. No intervention was done.  She returned to the cath lab on 5/16 for re-look. This was also performed by Dr. Herbie Baltimore. She was noted to have progression of LAD disease to total occlusion at the original ~95% subtotal location with improved D1-distal LAD and R-L collaterals (septal and distal RPDA). The images were reviewd by Dr. Allyson Sabal and Dr. Riley Kill and it was concluded that the best course of action was not to proceed with extensive LAD PTCA-PCI, in the absence of on-going symptoms. Medical therapy was recommended. It was decided to start Plavix, increase her BB and to continue on her ACE-I and Imdur.  She is also on Ranexa.  The patient had a Myoview stress test performed in the office that showed scar in the LAD territory without ischemia. She remains with stable angina on maximal antianginal medications  She tried to exercise on her treadmill but had chest pain, relief with rest.  She admits to chest pain and SOB if she pushes herself by walking up hill.  She realizes she has gained wt. And wants to exercise.  She has talked to Karenann Cai for wt loss but believes she do this on her  own.  Money is also an issue, that is why she could not do cardiac rehab.  Her episodes of chest pain are rare.  She had one episode of waking with dizziness, irregular heart rate and nausea, it resolved.    No Known Allergies  Current Outpatient Prescriptions  Medication Sig Dispense Refill  . ALPRAZolam (XANAX) 0.5 MG tablet Take 0.5 mg by mouth at bedtime as needed for anxiety.       Marland Kitchen amLODipine (NORVASC) 2.5 MG tablet Take 1 tablet (2.5 mg total) by mouth daily.  90 tablet  3  . aspirin EC 81 MG EC tablet Take 1 tablet (81 mg total) by mouth daily.      Marland Kitchen atorvastatin (LIPITOR) 40 MG tablet Take 1 tablet (40 mg total) by mouth daily at 6 PM.  60 tablet  5  . clopidogrel (PLAVIX) 75 MG tablet Take 1 tablet (75 mg total) by mouth daily with breakfast.  30 tablet  5  . estradiol (ESTRACE) 0.5 MG tablet Take 0.5 mg by mouth daily.        . isosorbide mononitrate (IMDUR) 30 MG 24 hr tablet Take 1 tablet (30 mg total) by mouth 2 (two) times daily.  60 tablet  5  . lisinopril (PRINIVIL,ZESTRIL) 2.5 MG tablet Take 2 tablets (5 mg total) by mouth 2 (two) times daily.  60 tablet  5  . MAGNESIUM PO Take 1 tablet by mouth daily.      Marland Kitchen  metoprolol tartrate (LOPRESSOR) 25 MG tablet Take 1 tablet (25 mg total) by mouth 2 (two) times daily.  60 tablet  5  . nitroGLYCERIN (NITROSTAT) 0.4 MG SL tablet Place 1 tablet (0.4 mg total) under the tongue every 5 (five) minutes as needed for chest pain.  25 tablet  5  . pantoprazole (PROTONIX) 40 MG tablet Take 1 tablet (40 mg total) by mouth 2 (two) times daily.  60 tablet  3  . ranolazine (RANEXA) 1000 MG SR tablet Take 1 tablet (1,000 mg total) by mouth 2 (two) times daily.  60 tablet  5  . [DISCONTINUED] phentermine 15 MG capsule Take 15 mg by mouth every morning.         No current facility-administered medications for this visit.    Past Medical History  Diagnosis Date  . Esophageal stricture   . Duodenitis   . Gastritis   . Tubular adenoma polyp of  rectum 2008  . Hypertension     2D ECHO, 09/30/2008 - EF >55%, normal: RENAL DOPPLER, 03/21/2007 - normal duplex study  . GERD (gastroesophageal reflux disease)   . IBS (irritable bowel syndrome)   . Ischemic colitis   . H/O hiatal hernia   . Lower GI bleeding     10/07/11 "I've had 3 episodes in the past year"  . History of esophageal stricture   . Colon polyps   . Myocardial infarction 12/10/2012    "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)  . CAD (coronary artery disease), possible SCAD 12/14/2012  . Dyspnea on exertion     LEXISCAN, 09/30/2008 - normal, EKG negative for ischemia, no ECG changes  . Hypothyroidism   . Gout     "only from RX given after MI" (01/22/2013)  . Anxiety   . Angina effort 05/22/2013    Past Surgical History  Procedure Laterality Date  . Abdominal hysterectomy  1999  . Back surgery  09/2008  . Hammer toe surgery Bilateral ~ 2002  . Tonsillectomy and adenoidectomy  1960's  . Esophageal dilation  08/2010  . Peripheral venous study  10/03/2008    No evidence of DVT, superficial thrombosis, or Baker's cyst  . Anterior lumbar fusion  2010    "L4-5" (01/22/2013)  . Tubal ligation  1985  . Esophageal dilation      "once or twice" (01/22/2013)    WUX:LKGMWNU:UV colds or fevers,  weight increase due to lack of exercise Skin:no rashes or ulcers HEENT:no blurred vision, no congestion CV:see HPI PUL:see HPI GI:no diarrhea constipation or melena, no indigestion GU:no hematuria, no dysuria MS:no joint pain, no claudication Neuro:no syncope, no lightheadedness Endo:no diabetes, + thyroid disease  PHYSICAL EXAM BP 110/80  Pulse 68  Ht 5\' 3"  (1.6 m)  Wt 204 lb 11.2 oz (92.851 kg)  BMI 36.27 kg/m2 General:Pleasant affect, NAD Skin:Warm and dry, brisk capillary refill HEENT:normocephalic, sclera clear, mucus membranes moist Neck:supple, no JVD, no bruits  Heart:S1S2 RRR without murmur, gallup, rub or click Lungs:clear without rales, rhonchi, or  wheezes OZD:GUYQ, non tender, + BS, do not palpate liver spleen or masses Ext:no lower ext edema, 2+ pedal pulses, 2+ radial pulses Neuro:alert and oriented, MAE, follows commands, + facial symmetry  EKG:SR with PVC HR 69, no acute changes from July EKG  ASSESSMENT AND PLAN Angina effort If pushes herself to walk up hill she has SOB and chest pain, resolves with rest.  Once she had episode of dizziness and nausea that woke her from sleep, improved over several days.  No chest pain at rest.  Difficult to increase medication due to BP.  She is on Max. Meds.    CAD (coronary artery disease), possible SCAD Coronary artery disease status post S CAD by cardiac catheterization with a non-ST segment elevation myocardial infarction. Marland Kitchenadditionally the cardiac cath revealed a moderately high-grade lesion in the distal circumflex.She has known CAD with LAD occlusion presumably related to spontaneous coronary artery dissection (SCAD). She has preserved left Ventricular function and a Myoview stress test performed 12/28/12 that showed scar in the LAD territory without ischemia.  Films reviewed with Dr. Evelene Croon regarding suitability for surgical revascularization. The decision was to treat her medically she was recalcitrant to antianginal medication. She is on maximal antianginals and currently is pain-free. Plans will be for continued medical therapy.    HTN (hypertension) Controlled   Hyperlipidemia Will recheck Chol and CMP, she had forgotten to have drawn.  Obesity (BMI 35.0-39.9 without comorbidity) Discussed weight loss and exercise.  She still would like to loose the weight on her own, but will consider Weight Watchers.   She will resume exercise for 10 min a day for 1 week then go back to 15 mg a day.

## 2013-05-22 NOTE — Assessment & Plan Note (Addendum)
Coronary artery disease status post S CAD by cardiac catheterization with a non-ST segment elevation myocardial infarction. Marland Kitchenadditionally the cardiac cath revealed a moderately high-grade lesion in the distal circumflex.She has known CAD with LAD occlusion presumably related to spontaneous coronary artery dissection (SCAD). She has preserved left Ventricular function and a Myoview stress test performed 12/28/12 that showed scar in the LAD territory without ischemia.  Films reviewed with Dr. Evelene Croon regarding suitability for surgical revascularization. The decision was to treat her medically she was recalcitrant to antianginal medication. She is on maximal antianginals and currently is pain-free. Plans will be for continued medical therapy.

## 2013-05-22 NOTE — Assessment & Plan Note (Signed)
If pushes herself to walk up hill she has SOB and chest pain, resolves with rest.  Once she had episode of dizziness and nausea that woke her from sleep, improved over several days.  No chest pain at rest.  Difficult to increase medication due to BP.  She is on Max. Meds.

## 2013-05-22 NOTE — Assessment & Plan Note (Signed)
Will recheck Chol and CMP, she had forgotten to have drawn.

## 2013-05-22 NOTE — Assessment & Plan Note (Signed)
Discussed weight loss and exercise.  She still would like to loose the weight on her own, but will consider Weight Watchers.   She will resume exercise for 10 min a day for 1 week then go back to 15 mg a day.

## 2013-05-22 NOTE — Patient Instructions (Signed)
Have cholesterol work done.  Watch diet.  Get help with diet through weight watchers or Amy Leron Croak.  Resume exercise, do 10 min for a week then go back and try 15 min.  Follow up with Dr. Allyson Sabal in 3 months.  Call if any chest pain.

## 2013-06-07 ENCOUNTER — Telehealth: Payer: Self-pay | Admitting: Cardiovascular Disease

## 2013-06-07 NOTE — Telephone Encounter (Signed)
Please call-says he needs approval to have dental work,

## 2013-06-07 NOTE — Telephone Encounter (Signed)
Dr Allyson Sabal reviewed chart.  No need for any special considerations for upcoming dental work.  No cardiac or valvular disease.  Pt notified

## 2013-06-07 NOTE — Telephone Encounter (Signed)
Returned call and pt verified x 2.  Pt stated she went in for a cleaning and they wouldn't do that.  Stated she had x-rays done and has a cavity.  Stated it is really close to the nerve and they want to work on it.  Pt informed Dr. Allyson Sabal will be notified.  Pt verbalized understanding and agreed w/ plan.  Appt for cavity evaluation is on Thursday, 11.13.14.  Cleaning rescheduled for April 2015.  Message forwarded to K. Petra Kuba, RN to discuss w/ Dr. Allyson Sabal.

## 2013-06-07 NOTE — Telephone Encounter (Signed)
Returned call.  Left message to call back before 4pm and leave message with type of procedure being done.

## 2013-06-23 ENCOUNTER — Other Ambulatory Visit: Payer: Self-pay | Admitting: Cardiology

## 2013-06-25 NOTE — Telephone Encounter (Signed)
Rx was sent to pharmacy electronically. 

## 2013-06-26 ENCOUNTER — Ambulatory Visit: Payer: Federal, State, Local not specified - PPO | Admitting: Cardiovascular Disease

## 2013-07-04 ENCOUNTER — Other Ambulatory Visit: Payer: Self-pay | Admitting: Cardiology

## 2013-07-04 ENCOUNTER — Other Ambulatory Visit: Payer: Self-pay

## 2013-07-04 MED ORDER — METOPROLOL TARTRATE 25 MG PO TABS: 25.0000 mg | ORAL_TABLET | Freq: Two times a day (BID) | ORAL | Status: DC

## 2013-07-04 NOTE — Telephone Encounter (Signed)
Rx was sent to pharmacy electronically. 

## 2013-07-11 ENCOUNTER — Ambulatory Visit: Payer: Federal, State, Local not specified - PPO | Admitting: Cardiovascular Disease

## 2013-07-20 ENCOUNTER — Ambulatory Visit (INDEPENDENT_AMBULATORY_CARE_PROVIDER_SITE_OTHER): Payer: Federal, State, Local not specified - PPO | Admitting: Cardiovascular Disease

## 2013-07-20 ENCOUNTER — Encounter: Payer: Self-pay | Admitting: Cardiovascular Disease

## 2013-07-20 VITALS — BP 136/68 | HR 72 | Ht 63.0 in | Wt 205.0 lb

## 2013-07-20 DIAGNOSIS — I2510 Atherosclerotic heart disease of native coronary artery without angina pectoris: Secondary | ICD-10-CM

## 2013-07-20 DIAGNOSIS — I10 Essential (primary) hypertension: Secondary | ICD-10-CM

## 2013-07-20 DIAGNOSIS — I200 Unstable angina: Secondary | ICD-10-CM

## 2013-07-20 MED ORDER — ISOSORBIDE MONONITRATE ER 60 MG PO TB24: 60.0000 mg | ORAL_TABLET | Freq: Every day | ORAL | Status: DC

## 2013-07-20 MED ORDER — AMLODIPINE BESYLATE 5 MG PO TABS: 5.0000 mg | ORAL_TABLET | Freq: Every day | ORAL | Status: DC

## 2013-07-20 NOTE — Assessment & Plan Note (Signed)
The patient has a history of what appears to be a spontaneous coronary artery dissection (SCAD) on multiple cardiac catheterizations.she has scar in the LAD territory. She does have chronic pain syndrome. I saw her back 6 month ago and a large angled, registered nurse practitioner, saw her about 2 months ago. The discomfort is present most the day and gets worse with exercise. She is on multiple anti-angina medications. And to adjust her amlodipine and her oral nitrate. She is already on ranolazine. My impression is that this is nonischemic.

## 2013-07-20 NOTE — Patient Instructions (Signed)
  Your physician wants you to follow-up with him in : 6 months with Dr Allyson Sabal                                            and with an extender in : 3 months with Nada Boozer NP                    You will receive a reminder letter in the mail one month in advance. If you don't receive a letter, please call our office to schedule the follow-up appointment.   Your physician has recommended you make the following change in your medication: Increase the Amlodipine to 5mg  daily and Increase the Imdur to 60mg  daily

## 2013-07-20 NOTE — Progress Notes (Signed)
07/20/2013 [name]   Feb 05, 1956  [id number]  Primary Physician Pcp Not In System Primary Cardiologist: [name] MD [name]   HPI:  The patient is a 57 y.o. female, followed at [facility] by Dr. Allyson Sabal, with a history of HTN, obesity, and hypothyroidism who presented on 12/11/12 after a prolonged episode of angina. Her EKG was without acute changes, but she ruled in for NSTEMI. Her troponin peaked at 4.95. She was placed on IV Heparin and underwent diagnostic coronary angiography. Her initial cardiac catheterization on 5/11, performed by Dr. Herbie Baltimore, demonstrated diffuse LAD disease with essentially subtotal occlusion that had the appearance of possible Spontaneous Coronary Artery Dissection. However, with existing CAD, simply diffuse disease was also possible. It was decided to not do any intervention at that time, but to wait and have the patient return, several days later, for a re-look cath to re-evaluate the LAD. She was maintained on medical therapy and had improvement in symtpoms. She returned to the cath lab on 5/16 for re-look. This was also performed by Dr. Herbie Baltimore. She was noted to have progression of LAD disease to total occlusion at the original ~95% subtotal location with improved D1-distal LAD and R-L collaterals (septal and distal RPDA). The images were reviewd by Dr. Allyson Sabal and Dr. Riley Kill and it was concluded that the best course of action was not to proceed with extensive LAD PTCA-PCI, in the absence of on-going symptoms. Medical therapy was recommended. It was decided to start Plavix, increase her BB and to continue on her ACE-I and Imdur. She left the cath lab in stable condition. She had no post-operative complications. Both catheterizations were preformed via the right radial artery. The access site remained stable. She had no further chest pain. Cardiac Rehab was consulted to assess the patient and to assist with ambulation. The patient denied  exertional angina. She was last seen and examined by Dr. Royann Shivers, who determined she was stable for discharge home. Dr. Herbie Baltimore had recommended having the patient undergo a nuclear perfusion stress test to assess for significant anterior ischemia. This has been arranged at [facility]. She is scheduled to follow-up at [facility] on 5/29. The patient had a Myoview stress test performed in the office that showed scar in the LAD territory without ischemia. She remains asymptomatic on maximal antianginal medications. She saw Nada Boozer register nurse practitioner in the office 2 months ago. She continues to have fairly constant chest pressure that gets worse with exertion.    Current Outpatient Prescriptions  Medication Sig Dispense Refill  . ALPRAZolam (XANAX) 0.5 MG tablet Take 0.5 mg by mouth at bedtime as needed for anxiety.       Marland Kitchen amLODipine (NORVASC) 5 MG tablet Take 1 tablet (5 mg total) by mouth daily.  90 tablet  3  . aspirin EC 81 MG EC tablet Take 1 tablet (81 mg total) by mouth daily.      Marland Kitchen atorvastatin (LIPITOR) 40 MG tablet Take 1 tablet (40 mg total) by mouth daily at 6 PM.  60 tablet  5  . clopidogrel (PLAVIX) 75 MG tablet Take 1 tablet (75 mg total) by mouth daily with breakfast.  30 tablet  5  . estradiol (ESTRACE) 0.5 MG tablet Take 0.5 mg by mouth daily.        . isosorbide mononitrate (IMDUR) 60 MG 24 hr tablet Take 1 tablet (60 mg total) by mouth daily.  90 tablet  6  . lisinopril (PRINIVIL,ZESTRIL) 2.5 MG tablet Take  2 tablets (5 mg total) by mouth 2 (two) times daily.  60 tablet  5  . MAGNESIUM PO Take 1 tablet by mouth daily.      . metoprolol tartrate (LOPRESSOR) 25 MG tablet Take 1 tablet (25 mg total) by mouth 2 (two) times daily.  60 tablet  5  . nitroGLYCERIN (NITROSTAT) 0.4 MG SL tablet Place 1 tablet (0.4 mg total) under the tongue every 5 (five) minutes as needed for chest pain.  25 tablet  5  . pantoprazole (PROTONIX) 40 MG tablet TAKE 1 TABLET BY MOUTH TWICE A DAY  60 tablet   5  . ranolazine (RANEXA) 1000 MG SR tablet Take 1 tablet (1,000 mg total) by mouth 2 (two) times daily.  60 tablet  5  . [DISCONTINUED] phentermine 15 MG capsule Take 15 mg by mouth every morning.         No current facility-administered medications for this visit.    No Known Allergies  History   Social History  . Marital Status: Single    Spouse Name: N/A    Number of Children: 2  . Years of Education: N/A   Occupational History  . Drug Programmer, [employer]   Social History Main Topics  . Smoking status: Never Smoker   . Smokeless tobacco: Never Used  . Alcohol Use: No  . Drug Use: No  . Sexual Activity: No   Other Topics Concern  . Not on file   Social History Narrative   Daily caffeine use.     Review of Systems: General: negative for chills, fever, night sweats or weight changes.  Cardiovascular: negative for chest pain, dyspnea on exertion, edema, orthopnea, palpitations, paroxysmal nocturnal dyspnea or shortness of breath Dermatological: negative for rash Respiratory: negative for cough or wheezing Urologic: negative for hematuria Abdominal: negative for nausea, vomiting, diarrhea, bright red blood per rectum, melena, or hematemesis Neurologic: negative for visual changes, syncope, or dizziness All other systems reviewed and are otherwise negative except as noted above.    Blood pressure 136/68, pulse 72, height 5\' 3"  (1.6 m), weight 205 lb (92.987 kg).  General appearance: alert and no distress Neck: no adenopathy, no carotid bruit, no JVD, supple, symmetrical, trachea midline and thyroid not enlarged, symmetric, no tenderness/mass/nodules Lungs: clear to auscultation bilaterally Heart: regular rate and rhythm, S1, S2 normal, no murmur, click, rub or gallop Extremities: extremities normal, atraumatic, no cyanosis or edema  EKG normal sinus rhythm at 62 with no ST or T wave changes  ASSESSMENT AND PLAN:   CAD (coronary artery disease),  possible SCAD The patient has a history of what appears to be a spontaneous coronary artery dissection (SCAD) on multiple cardiac catheterizations.she has scar in the LAD territory. She does have chronic pain syndrome. I saw her back 6 month ago and a large angled, registered nurse practitioner, saw her about 2 months ago. The discomfort is present most the day and gets worse with exercise. She is on multiple anti-angina medications. And to adjust her amlodipine and her oral nitrate. She is already on ranolazine. My impression is that this is nonischemic.  Hyperlipidemia On statin therapy with recent lipid profile performed 12/11/12 revealing a total cholesterol 187, LDL 103 HDL 52  HTN (hypertension) Well-controlled on current medications      [name] MD [facility], [facility] 07/20/2013 12:27 PM

## 2013-07-20 NOTE — Assessment & Plan Note (Signed)
Well-controlled on current medications 

## 2013-07-20 NOTE — Assessment & Plan Note (Signed)
On statin therapy with recent lipid profile performed 12/11/12 revealing a total cholesterol 187, LDL 103 HDL 52

## 2013-08-21 ENCOUNTER — Telehealth: Payer: Self-pay | Admitting: Cardiovascular Disease

## 2013-08-21 MED ORDER — LISINOPRIL 5 MG PO TABS: 5.0000 mg | ORAL_TABLET | Freq: Two times a day (BID) | ORAL | Status: DC

## 2013-08-21 NOTE — Telephone Encounter (Signed)
Wants to know if she is taking her Lisinopril  correctly.

## 2013-08-21 NOTE — Telephone Encounter (Signed)
Returned call.  Left message to call back before 4pm.  Pt should be taking lisinopril 2.5 mg twice daily.

## 2013-08-21 NOTE — Telephone Encounter (Signed)
Returned call and pt verified x 2.  Pt stated the lisinopril was changed to two 2.5 mg tabs twice daily, but she was only given #60 which isn't a month supply.  Pt informed RN will update Rx to 5 mg tabs take one tab twice daily.  Pt verbalized understanding and agreed w/ plan.  Refill(s) sent to pharmacy.

## 2013-08-21 NOTE — Telephone Encounter (Signed)
Returning your call. °

## 2013-08-25 ENCOUNTER — Other Ambulatory Visit: Payer: Self-pay | Admitting: Cardiology

## 2013-08-27 ENCOUNTER — Other Ambulatory Visit: Payer: Self-pay

## 2013-08-27 MED ORDER — CLOPIDOGREL BISULFATE 75 MG PO TABS: ORAL_TABLET | ORAL | Status: DC

## 2013-08-27 NOTE — Telephone Encounter (Signed)
Rx was sent to pharmacy electronically. 

## 2013-09-07 ENCOUNTER — Telehealth: Payer: Self-pay | Admitting: Internal Medicine

## 2013-09-07 NOTE — Telephone Encounter (Signed)
Spoke with pt and she states that she actually has eaten something this morning and feels better and thinks she is doing better. Pt knows to call back if she does not continue to improve.

## 2013-09-11 ENCOUNTER — Other Ambulatory Visit: Payer: Self-pay | Admitting: Cardiovascular Disease

## 2013-09-20 ENCOUNTER — Telehealth: Payer: Self-pay | Admitting: Cardiovascular Disease

## 2013-09-20 NOTE — Telephone Encounter (Signed)
Please call,she have not been feeling good.She says she need to run something by you.

## 2013-09-20 NOTE — Telephone Encounter (Signed)
Spoke to patient. She states she has a litte discomfort after walking ,especially outside She also states she some late in the evening- she walks about 20 min a day outside and treadmill combined. She states she is taking her medication as prescribed.  RN offered an appointment to see an extender sooner than her 3 month recall.p Patient states she can wait until then.  Appointment schedule 10/22/13 at 9 am with Cecilie [name] NP.  RN instructed patient to keep a monitor on discomfort and break up exercise in 10 minute increments, also exercise inside while it is so cold. Contact office if needed.

## 2013-10-08 ENCOUNTER — Ambulatory Visit (INDEPENDENT_AMBULATORY_CARE_PROVIDER_SITE_OTHER): Payer: Federal, State, Local not specified - PPO | Admitting: Cardiology

## 2013-10-08 ENCOUNTER — Encounter: Payer: Self-pay | Admitting: Cardiology

## 2013-10-08 VITALS — BP 138/80 | HR 64 | Ht 63.0 in | Wt 197.0 lb

## 2013-10-08 DIAGNOSIS — R000 Tachycardia, unspecified: Secondary | ICD-10-CM | POA: Insufficient documentation

## 2013-10-08 DIAGNOSIS — R42 Dizziness and giddiness: Secondary | ICD-10-CM | POA: Insufficient documentation

## 2013-10-08 DIAGNOSIS — I10 Essential (primary) hypertension: Secondary | ICD-10-CM

## 2013-10-08 DIAGNOSIS — I2510 Atherosclerotic heart disease of native coronary artery without angina pectoris: Secondary | ICD-10-CM

## 2013-10-08 NOTE — Patient Instructions (Signed)
Wear monitor for 30 days to see if heart is cause of dizziness  Follow up with Dr. Gwenlyn Found in 3 months.   We will call if any arrhthymias on monitor. Call if increased chest discomfort.

## 2013-10-08 NOTE — Assessment & Plan Note (Signed)
controlled 

## 2013-10-08 NOTE — Progress Notes (Signed)
10/08/2013   PCP: Pcp Not In System   Chief Complaint  Patient presents with  . Follow-up    having alot of dizziness and periods of SOB    Primary Cardiologist: Dr. Gwenlyn Found  HPI: 58 y.o. female, followed at [facility] by Dr. Gwenlyn Found, with a history of HTN, obesity, and hypothyroidism who presented on 12/11/12 after a prolonged episode of angina. Her EKG was without acute changes, but she ruled in for NSTEMI. Her troponin peaked at 4.95. She was placed on IV Heparin and underwent diagnostic coronary angiography. Her initial cardiac catheterization on 5/11, performed by Dr. Ellyn Hack, demonstrated diffuse LAD disease with essentially subtotal occlusion that had the appearance of possible Spontaneous Coronary Artery Dissection. However, with existing CAD, simply diffuse disease was also possible. It was decided to not do any intervention at that time, but to wait and have the patient return, several days later, for a re-look cath to re-evaluate the LAD. She was maintained on medical therapy and had improvement in symtpoms. She returned to the cath lab on 5/16 for re-look. This was also performed by Dr. Ellyn Hack. She was noted to have progression of LAD disease to total occlusion at the original ~95% subtotal location with improved D1-distal LAD and R-L collaterals (septal and distal RPDA). The images were reviewd by Dr. Gwenlyn Found and Dr. Lia Foyer and it was concluded that the best course of action was not to proceed with extensive LAD PTCA-PCI, in the absence of on-going symptoms. Medical therapy was recommended. It was decided to start Plavix, increase her BB and to continue on her ACE-I and Imdur.     The patient had a Myoview stress test performed in the office that showed scar in the LAD territory without ischemia.   She is back for follow up today with complaints of dizziness, near syncope, occurs while sitting or lying.  Her heart feels strange at the same time.  No chest pain, though she does  complain about rt side chest discomfort on treadmill.  Dr. Gwenlyn Found did not believe this to be ischemic pain.  Today she continues with this discomfort at times, but nothing severe.  Her dizziness concerns her more.  She does have orthostatic BP drop here in the office but no symptoms.  She believes her heart beat is irreg with the dizziness.  No SOB.  No Known Allergies  Current Outpatient Prescriptions  Medication Sig Dispense Refill  . ALPRAZolam (XANAX) 0.5 MG tablet Take 0.5 mg by mouth at bedtime as needed for anxiety.       Marland Kitchen amLODipine (NORVASC) 5 MG tablet Take 1 tablet (5 mg total) by mouth daily.  90 tablet  3  . aspirin EC 81 MG EC tablet Take 1 tablet (81 mg total) by mouth daily.      Marland Kitchen atorvastatin (LIPITOR) 40 MG tablet Take 1 tablet (40 mg total) by mouth daily at 6 PM.  60 tablet  5  . clopidogrel (PLAVIX) 75 MG tablet TAKE 1 TABLET BY MOUTH ONCE A DAY WITH BREAKFAST.  30 tablet  11  . estradiol (ESTRACE) 0.5 MG tablet Take 1 mg by mouth daily.       . isosorbide mononitrate (IMDUR) 60 MG 24 hr tablet Take 1 tablet (60 mg total) by mouth daily.  90 tablet  6  . lisinopril (PRINIVIL,ZESTRIL) 5 MG tablet Take 1 tablet (5 mg total) by mouth 2 (two) times daily.  60 tablet  4  .  MAGNESIUM PO Take 1 tablet by mouth daily.      . metoprolol tartrate (LOPRESSOR) 25 MG tablet Take 1 tablet (25 mg total) by mouth 2 (two) times daily.  60 tablet  5  . nitroGLYCERIN (NITROSTAT) 0.4 MG SL tablet Place 1 tablet (0.4 mg total) under the tongue every 5 (five) minutes as needed for chest pain.  25 tablet  5  . pantoprazole (PROTONIX) 40 MG tablet TAKE 1 TABLET BY MOUTH TWICE A DAY  60 tablet  5  . RANEXA 1000 MG SR tablet TAKE 1 TABLET (1,000 MG TOTAL) BY MOUTH 2 (TWO) TIMES DAILY.  60 each  5  . [DISCONTINUED] phentermine 15 MG capsule Take 15 mg by mouth every morning.         No current facility-administered medications for this visit.    Past Medical History  Diagnosis Date  . Esophageal  stricture   . Duodenitis   . Gastritis   . Tubular adenoma polyp of rectum 2008  . Hypertension     2D ECHO, 09/30/2008 - EF >55%, normal: RENAL DOPPLER, 03/21/2007 - normal duplex study  . GERD (gastroesophageal reflux disease)   . IBS (irritable bowel syndrome)   . Ischemic colitis   . H/O hiatal hernia   . Lower GI bleeding     10/07/11 "I've had 3 episodes in the past year"  . History of esophageal stricture   . Colon polyps   . Myocardial infarction 12/10/2012    "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)  . CAD (coronary artery disease), possible SCAD 12/14/2012  . Dyspnea on exertion     LEXISCAN, 09/30/2008 - normal, EKG negative for ischemia, no ECG changes  . Hypothyroidism   . Gout     "only from RX given after MI" (01/22/2013)  . Anxiety   . Angina effort 05/22/2013    Past Surgical History  Procedure Laterality Date  . Abdominal hysterectomy  1999  . Back surgery  09/2008  . Hammer toe surgery Bilateral ~ 2002  . Tonsillectomy and adenoidectomy  1960's  . Esophageal dilation  08/2010  . Peripheral venous study  10/03/2008    No evidence of DVT, superficial thrombosis, or Baker's cyst  . Anterior lumbar fusion  2010    "L4-5" (01/22/2013)  . Tubal ligation  1985  . Esophageal dilation      "once or twice" (01/22/2013)    FYB:OFBPZWC:HE colds or fevers, no weight changes Skin:no rashes or ulcers HEENT:no blurred vision, no congestion CV:see HPI PUL:see HPI GI:no diarrhea constipation or melena, no indigestion GU:no hematuria, no dysuria MS:no joint pain, no claudication Neuro:no syncope a couple of episodes of near syncope, no lightheadedness, + dizziness Endo:no diabetes, no thyroid disease  PHYSICAL EXAM BP 138/80  Pulse 64  Ht 5\' 3"  (1.6 m)  Wt 197 lb (89.359 kg)  BMI 34.91 kg/m2 When pt stood her BP dropped to 527 systolic but no dizziness General:Pleasant affect, NAD Skin:Warm and dry, brisk capillary refill HEENT:normocephalic, sclera clear,  mucus membranes moist Neck:supple, no JVD, no bruits  Heart:S1S2 RRR without murmur, gallup, rub or click Lungs:clear without rales, rhonchi, or wheezes POE:UMPN, non tender, + BS, do not palpate liver spleen or masses Ext:no lower ext edema, 2+ pedal pulses, 2+ radial pulses Neuro:alert and oriented, MAE, follows commands, + facial symmetry  EKG:SR normal EKG  ASSESSMENT AND PLAN Dizziness Episodes with lying or sitting.  Not with standing.  Feels heart beat is irregular.  Will wear a monitor X  30 days if we are unable to catch and the episode continue we may go to [facility].   CAD (coronary artery disease), possible SCAD Last nuc neg. For ischemia.  Mild chest discomfort that continues to be present.  But no worse than when seen by Dr Gwenlyn Found.  Not felt to be ischemic.  Will monitor.  HTN (hypertension) controlled

## 2013-10-08 NOTE — Assessment & Plan Note (Signed)
Episodes with lying or sitting.  Not with standing.  Feels heart beat is irregular.  Will wear a monitor X 30 days if we are unable to catch and the episode continue we may go to [facility].

## 2013-10-08 NOTE — Assessment & Plan Note (Signed)
Last nuc neg. For ischemia.  Mild chest discomfort that continues to be present.  But no worse than when seen by Dr Gwenlyn Found.  Not felt to be ischemic.  Will monitor.

## 2013-10-10 ENCOUNTER — Telehealth: Payer: Self-pay | Admitting: Cardiovascular Disease

## 2013-10-10 NOTE — Telephone Encounter (Signed)
Aida from [facility] called stated the diagnosis code Dizziness which was used for patient monitor was not covered by her insurance, after reading the office note i told her if she can used near Syncope she stated that was good.

## 2013-10-22 ENCOUNTER — Ambulatory Visit: Payer: Federal, State, Local not specified - PPO | Admitting: Cardiology

## 2013-11-21 ENCOUNTER — Telehealth: Payer: Self-pay | Admitting: Cardiovascular Disease

## 2013-11-21 NOTE — Telephone Encounter (Signed)
Returned call and pt verified x 2.  Pt c/o retaining "a lot of fluid" for the past couple of weeks.  Pt c/o LE edema when she wakes up in the morning.  Stated she has noticed swelling all over, but more in her feet.  Denied CP or SOB.  Denied checking daily weight.  Denied change in diet over the last couple of weeks.  Admits to eating out and diet not being where it should.  Also stated she drinks lots of water.  Denied having compression stockings at home.  Advice  Decrease eating out or select healthier choices  Continue drinking water  Elevate LEs when lying down  Purchase otc support socks (optional)  Call back with any changes or no improvement in 48 hours  Pt also informed Dr. Gwenlyn Found will be notified in case he has other directions.  Pt verbalized understanding and agreed w/ plan.   Message forwarded to Curt Bears, [facility]).

## 2013-11-21 NOTE — Telephone Encounter (Signed)
Please call,pt is retaining a lot of fluid.She wants to know what she needs to do.

## 2013-12-29 ENCOUNTER — Other Ambulatory Visit: Payer: Self-pay | Admitting: Cardiology

## 2013-12-31 NOTE — Telephone Encounter (Signed)
Rx refill sent to patient pharmacy   

## 2014-01-08 ENCOUNTER — Ambulatory Visit: Payer: Federal, State, Local not specified - PPO | Admitting: Cardiovascular Disease

## 2014-01-15 ENCOUNTER — Other Ambulatory Visit: Payer: Self-pay

## 2014-01-15 ENCOUNTER — Other Ambulatory Visit: Payer: Self-pay | Admitting: Cardiology

## 2014-01-15 MED ORDER — ATORVASTATIN CALCIUM 40 MG PO TABS: 40.0000 mg | ORAL_TABLET | Freq: Every day | ORAL | Status: DC

## 2014-01-15 NOTE — Telephone Encounter (Signed)
Rx was sent to pharmacy electronically. 

## 2014-01-28 ENCOUNTER — Other Ambulatory Visit: Payer: Self-pay | Admitting: Cardiovascular Disease

## 2014-03-18 ENCOUNTER — Other Ambulatory Visit: Payer: Self-pay | Admitting: Cardiovascular Disease

## 2014-03-19 NOTE — Telephone Encounter (Signed)
Rx was sent to pharmacy electronically. 

## 2014-03-25 ENCOUNTER — Other Ambulatory Visit: Payer: Self-pay

## 2014-03-25 ENCOUNTER — Telehealth: Payer: Self-pay

## 2014-03-25 DIAGNOSIS — R197 Diarrhea, unspecified: Secondary | ICD-10-CM

## 2014-03-25 NOTE — Telephone Encounter (Signed)
Stool pathogen panel including Clostridium difficile PCR. In the meantime, Imodium for diarrhea

## 2014-03-25 NOTE — Telephone Encounter (Signed)
Spoke with pt and she is aware. Orders entered in epic.

## 2014-03-25 NOTE — Telephone Encounter (Signed)
Pt states that she has had diarrhea for a week. States that she is having about 3-4 loose/liquid stools per day and that there is a very foul odor to the stool. Pt states she has not been on any antibiotics recently. Please advise.

## 2014-03-27 ENCOUNTER — Telehealth: Payer: Self-pay | Admitting: Internal Medicine

## 2014-03-27 NOTE — Telephone Encounter (Signed)
Pt called and states that she did not have diarrhea yesterday or today. States she does not think she needs to come for stool tests now. Instructed pt to call back if anything changed, she verbalized understanding.

## 2014-03-28 ENCOUNTER — Telehealth: Payer: Self-pay | Admitting: *Deleted

## 2014-03-28 NOTE — Telephone Encounter (Signed)
Information received from [facility] saying that a prior authorization was needed for [name]'s protonix.  Info filled out and sent to [facility]. PA was approved and approval letter faxed to [place].

## 2014-04-03 ENCOUNTER — Telehealth: Payer: Self-pay | Admitting: Cardiovascular Disease

## 2014-04-03 DIAGNOSIS — R600 Localized edema: Secondary | ICD-10-CM

## 2014-04-03 DIAGNOSIS — Z79899 Other long term (current) drug therapy: Secondary | ICD-10-CM

## 2014-04-03 DIAGNOSIS — I10 Essential (primary) hypertension: Secondary | ICD-10-CM

## 2014-04-03 DIAGNOSIS — E782 Mixed hyperlipidemia: Secondary | ICD-10-CM

## 2014-04-03 NOTE — Telephone Encounter (Signed)
Pt called in stating that she is still having some swelling and would like to have some blood work due before she comes in to see Dr.Berry. Please call  Thanks

## 2014-04-03 NOTE — Telephone Encounter (Signed)
Returned call to patient. She states she is having bilateral LE edema and edema "all over" and has had this for months. It it NOT worse. She has had no meds changes. She reports trying to avoid extra salt in her diet and processed foods, but states this is difficult to do. She does not weigh daily. She denies CP/SOB but c/o fatigue   She would like to make appointment with Dr. Gwenlyn Found - of note, had an appointment in June but canceled r/t being out of town and was not rescheduled.   She would like to see Dr. Gwenlyn Found or Mickel Baas, NP and would like to have blood work done prior to her office visit (date TBD) to see if her edema is from her "heart, liver, or kidneys"  Will defer to Dr. Gwenlyn Found to advise on what labs should be ordered  Message sent to scheduler to make appointment for patient.

## 2014-04-04 NOTE — Telephone Encounter (Signed)
I spoke with patient and informed her of her appt on 9/24 with Dr Gwenlyn Found.  Also, I ordered labs for patient to have drawn prior to appt.  Patient verbalized understanding.

## 2014-04-05 ENCOUNTER — Telehealth: Payer: Self-pay | Admitting: Cardiovascular Disease

## 2014-04-05 NOTE — Telephone Encounter (Signed)
Closed encounter °

## 2014-04-15 LAB — COMPLETE METABOLIC PANEL WITH GFR
ALK PHOS: 89 U/L (ref 39–117)
ALT: 15 U/L (ref 0–35)
AST: 14 U/L (ref 0–37)
Albumin: 3.6 g/dL (ref 3.5–5.2)
BILIRUBIN TOTAL: 0.6 mg/dL (ref 0.2–1.2)
BUN: 11 mg/dL (ref 6–23)
CO2: 26 meq/L (ref 19–32)
CREATININE: 0.9 mg/dL (ref 0.50–1.10)
Calcium: 9.3 mg/dL (ref 8.4–10.5)
Chloride: 104 mEq/L (ref 96–112)
GFR, EST AFRICAN AMERICAN: 82 mL/min
GFR, Est Non African American: 71 mL/min
Glucose, Bld: 84 mg/dL (ref 70–99)
Potassium: 4.2 mEq/L (ref 3.5–5.3)
SODIUM: 138 meq/L (ref 135–145)
TOTAL PROTEIN: 6.5 g/dL (ref 6.0–8.3)

## 2014-04-15 LAB — LIPID PANEL
CHOL/HDL RATIO: 2.9 ratio
Cholesterol: 184 mg/dL (ref 0–200)
HDL: 63 mg/dL (ref >39)
LDL CALC: 92 mg/dL (ref 0–99)
TRIGLYCERIDES: 145 mg/dL (ref <150)
VLDL: 29 mg/dL (ref 0–40)

## 2014-04-16 LAB — PRO B NATRIURETIC PEPTIDE: PRO B NATRI PEPTIDE: 168.7 pg/mL — AB (ref <126)

## 2014-04-25 ENCOUNTER — Ambulatory Visit (INDEPENDENT_AMBULATORY_CARE_PROVIDER_SITE_OTHER): Payer: Federal, State, Local not specified - PPO | Admitting: Cardiovascular Disease

## 2014-04-25 ENCOUNTER — Encounter: Payer: Self-pay | Admitting: Cardiovascular Disease

## 2014-04-25 VITALS — BP 132/94 | HR 80 | Ht 63.0 in | Wt 206.1 lb

## 2014-04-25 DIAGNOSIS — I10 Essential (primary) hypertension: Secondary | ICD-10-CM

## 2014-04-25 DIAGNOSIS — I2510 Atherosclerotic heart disease of native coronary artery without angina pectoris: Secondary | ICD-10-CM

## 2014-04-25 DIAGNOSIS — R5383 Other fatigue: Secondary | ICD-10-CM

## 2014-04-25 DIAGNOSIS — E785 Hyperlipidemia, unspecified: Secondary | ICD-10-CM

## 2014-04-25 DIAGNOSIS — R5381 Other malaise: Secondary | ICD-10-CM

## 2014-04-25 NOTE — Progress Notes (Signed)
04/25/2014 [name]   [birth year]  [id number]  Primary Physician Pcp Not In System Primary Cardiologist: Sabrina Harp MD [name]   HPI:  The patient is a 58y.o. female, who I last saw December of last year with a history of HTN, obesity, and hypothyroidism who presented on 12/11/12 after a prolonged episode of angina. Her EKG was without acute changes, but she ruled in for NSTEMI. Her troponin peaked at 4.95. She was placed on IV Heparin and underwent diagnostic coronary angiography. Her initial cardiac catheterization on 5/11, performed by Dr. Ellyn Day, demonstrated diffuse LAD disease with essentially subtotal occlusion that had the appearance of possible Spontaneous Coronary Artery Dissection. However, with existing CAD, simply diffuse disease was also possible. It was decided to not do any intervention at that time, but to wait and have the patient return, several days later, for a re-look cath to re-evaluate the LAD. She was maintained on medical therapy and had improvement in symtpoms. She returned to the cath lab on 5/16 for re-look. This was also performed by Dr. Ellyn Day. She was noted to have progression of LAD disease to total occlusion at the original ~95% subtotal location with improved D1-distal LAD and R-L collaterals (septal and distal RPDA). The images were reviewd by Dr. Gwenlyn Day and Dr. Lia Day and it was concluded that the best course of action was not to proceed with extensive LAD PTCA-PCI, in the absence of on-going symptoms. Medical therapy was recommended. It was decided to start Plavix, increase her BB and to continue on her ACE-I and Imdur. She left the cath lab in stable condition. She had no post-operative complications. Both catheterizations were preformed via the right radial artery. The access site remained stable. She had no further chest pain. Cardiac Rehab was consulted to assess the patient and to assist with ambulation. The patient denied  exertional angina. She was last seen and examined by Dr. Sallyanne Day, who determined she was stable for discharge home. Dr. Ellyn Day had recommended having the patient undergo a nuclear perfusion stress test to assess for significant anterior ischemia. This has been arranged at [facility]. She is scheduled to follow-up at [facility] on 5/29. The patient had a Myoview stress test performed in the office that showed scar in the LAD territory without ischemia. She remains asymptomatic on maximal antianginal medications.  She [name] in the office back in March with complaints of occasional dizziness but denied chest pain or shortness of breath. Her major complaint today is simply a fatigue.    Current Outpatient Prescriptions  Medication Sig Dispense Refill  . ALPRAZolam (XANAX) 0.5 MG tablet Take 0.5 mg by mouth at bedtime as needed for anxiety.       Marland Kitchen amLODipine (NORVASC) 5 MG tablet Take 1 tablet (5 mg total) by mouth daily.  90 tablet  3  . aspirin EC 81 MG EC tablet Take 1 tablet (81 mg total) by mouth daily.      Marland Kitchen atorvastatin (LIPITOR) 40 MG tablet Take 1 tablet (40 mg total) by mouth daily at 6 PM.  30 tablet  9  . clopidogrel (PLAVIX) 75 MG tablet TAKE 1 TABLET BY MOUTH ONCE A DAY WITH BREAKFAST.  30 tablet  11  . estradiol (ESTRACE) 0.5 MG tablet Take 1 mg by mouth daily.       . isosorbide mononitrate (IMDUR) 60 MG 24 hr tablet Take 1 tablet (60 mg total) by mouth daily.  90 tablet  6  . lisinopril (PRINIVIL,ZESTRIL)  5 MG tablet TAKE 1 TABLET (5 MG TOTAL) BY MOUTH 2 (TWO) TIMES DAILY.  60 tablet  5  . MAGNESIUM PO Take 1 tablet by mouth daily.      . metoprolol tartrate (LOPRESSOR) 25 MG tablet TAKE 1 TABLET (25 MG TOTAL) BY MOUTH 2 (TWO) TIMES DAILY.  60 tablet  5  . nitroGLYCERIN (NITROSTAT) 0.4 MG SL tablet Place 1 tablet (0.4 mg total) under the tongue every 5 (five) minutes as needed for chest pain.  25 tablet  5  . pantoprazole (PROTONIX) 40 MG tablet TAKE 1 TABLET BY MOUTH TWICE A DAY  60  tablet  5  . RANEXA 1000 MG SR tablet TAKE 1 TABLET BY MOUTH 2 TIMES DAILY.  60 tablet  7  . [DISCONTINUED] phentermine 15 MG capsule Take 15 mg by mouth every morning.         No current facility-administered medications for this visit.    No Known Allergies  History   Social History  . Marital Status: Single    Spouse Name: N/A    Number of Children: 2  . Years of Education: N/A   Occupational History  . [employer] Editor, commissioning   Social History Main Topics  . Smoking status: Never Smoker   . Smokeless tobacco: Never Used  . Alcohol Use: No  . Drug Use: No  . Sexual Activity: No   Other Topics Concern  . Not on file   Social History Narrative   Daily caffeine use.     Review of Systems: General: negative for chills, fever, night sweats or weight changes.  Cardiovascular: negative for chest pain, dyspnea on exertion, edema, orthopnea, palpitations, paroxysmal nocturnal dyspnea or shortness of breath Dermatological: negative for rash Respiratory: negative for cough or wheezing Urologic: negative for hematuria Abdominal: negative for nausea, vomiting, diarrhea, bright red blood per rectum, melena, or hematemesis Neurologic: negative for visual changes, syncope, or dizziness All other systems reviewed and are otherwise negative except as noted above.    Blood pressure 132/94, pulse 80, height 5\' 3"  (1.6 m), weight 206 lb 1.6 oz (93.486 kg).  General appearance: alert and no distress Neck: no adenopathy, no carotid bruit, no JVD, supple, symmetrical, trachea midline and thyroid not enlarged, symmetric, no tenderness/mass/nodules Lungs: clear to auscultation bilaterally Heart: regular rate and rhythm, S1, S2 normal, no murmur, click, rub or gallop Extremities: extremities normal, atraumatic, no cyanosis or edema  EKG normal sinus rhythm at 80 with septal Q waves and occasional PVCs  ASSESSMENT AND PLAN:   HTN (hypertension) Controlled on current  medications  Hyperlipidemia On statin therapy with recent lipid profile performed 04/15/14 revealing a total cholesterol of 184  , LDL of 92 HDL of 63  CAD (coronary artery disease), possible SCAD History of occluded LAD with scar by Myoview stress testing.her ejection fraction by QGS  was 55% The patient denies chest pain or shortness of breath.      Sabrina Harp MD FACP,FACC,FAHA, [facility] 04/25/2014 3:26 PM

## 2014-04-25 NOTE — Patient Instructions (Signed)
We request that you follow-up in: 6 months with Sabrina [name] NP and in 1 year with Dr [name] will receive a reminder letter in the mail two months in advance. If you don't receive a letter, please call our office to schedule the follow-up appointment.  Have blood work done.

## 2014-04-25 NOTE — Assessment & Plan Note (Signed)
Controlled on current medications 

## 2014-04-25 NOTE — Assessment & Plan Note (Addendum)
History of occluded LAD with scar by Myoview stress testing.her ejection fraction by QGS  was 55% The patient denies chest pain or shortness of breath.

## 2014-04-25 NOTE — Assessment & Plan Note (Signed)
On statin therapy with recent lipid profile performed 04/15/14 revealing a total cholesterol of 184  , LDL of 92 HDL of 63

## 2014-04-26 LAB — CBC
HCT: 37.3 % (ref 36.0–46.0)
Hemoglobin: 13 g/dL (ref 12.0–15.0)
MCH: 31.3 pg (ref 26.0–34.0)
MCHC: 34.9 g/dL (ref 30.0–36.0)
MCV: 89.7 fL (ref 78.0–100.0)
PLATELETS: 328 10*3/uL (ref 150–400)
RBC: 4.16 MIL/uL (ref 3.87–5.11)
RDW: 13.4 % (ref 11.5–15.5)
WBC: 7.9 10*3/uL (ref 4.0–10.5)

## 2014-04-26 LAB — TSH: TSH: 4.241 u[IU]/mL (ref 0.350–4.500)

## 2014-04-26 LAB — T4, FREE: Free T4: 1.19 ng/dL (ref 0.80–1.80)

## 2014-04-29 ENCOUNTER — Encounter: Payer: Self-pay | Admitting: *Deleted

## 2014-07-11 ENCOUNTER — Encounter (HOSPITAL_COMMUNITY): Payer: Self-pay | Admitting: Cardiology

## 2014-08-12 ENCOUNTER — Other Ambulatory Visit: Payer: Self-pay | Admitting: Cardiovascular Disease

## 2014-08-12 ENCOUNTER — Telehealth: Payer: Self-pay | Admitting: Cardiovascular Disease

## 2014-08-12 NOTE — Telephone Encounter (Signed)
Pt was asking what she could take regarding a sinus headache and/or cold. Advised patient she could take Claritin, Zyrtec, Mucinex (not the DM kind), and Robitussin DM as her options. Pt stated she has had a sinus headache for several days. Pt voiced understanding and will call back if has any concerns.

## 2014-08-12 NOTE — Telephone Encounter (Signed)
Rx(s) sent to pharmacy electronically.  

## 2014-08-12 NOTE — Telephone Encounter (Signed)
Pt wants to know what she can take for her sinus please?

## 2014-09-19 ENCOUNTER — Other Ambulatory Visit: Payer: Self-pay | Admitting: Cardiovascular Disease

## 2014-09-19 NOTE — Telephone Encounter (Signed)
Rx(s) sent to pharmacy electronically.  

## 2014-10-09 ENCOUNTER — Encounter: Payer: Self-pay | Admitting: Cardiology

## 2014-10-09 ENCOUNTER — Ambulatory Visit (INDEPENDENT_AMBULATORY_CARE_PROVIDER_SITE_OTHER): Payer: Federal, State, Local not specified - PPO | Admitting: Cardiology

## 2014-10-09 VITALS — BP 122/86 | HR 66 | Ht 63.0 in | Wt 212.2 lb

## 2014-10-09 DIAGNOSIS — R002 Palpitations: Secondary | ICD-10-CM | POA: Diagnosis not present

## 2014-10-09 DIAGNOSIS — I2510 Atherosclerotic heart disease of native coronary artery without angina pectoris: Secondary | ICD-10-CM

## 2014-10-09 MED ORDER — HYDROCHLOROTHIAZIDE 25 MG PO TABS: 12.5000 mg | ORAL_TABLET | Freq: Every day | ORAL | Status: DC

## 2014-10-09 MED ORDER — LISINOPRIL 5 MG PO TABS: 5.0000 mg | ORAL_TABLET | Freq: Every day | ORAL | Status: DC

## 2014-10-09 NOTE — Progress Notes (Signed)
Cardiology Office Note   Date:  10/09/2014   ID:  [name], DOB 17-Feb-1956, MRN [id number]  PCP:  No primary care provider on file.  Cardiologist:  Dr. Adora Fridge     Chief Complaint  Patient presents with  . Coronary Artery Disease    6 months:  No complaints of chest pain or edema.  SOB with activity.  Occas. dizziness.      History of Present Illness:  [name] is a 59 y.o. female who presents for eval of her CAD.  She has a history of HTN, obesity, and hypothyroidism who presented on 12/11/12 after a prolonged episode of angina. Her EKG was without acute changes, but she ruled in for NSTEMI. Her troponin peaked at 4.95. She was placed on IV Heparin and underwent diagnostic coronary angiography. Her initial cardiac catheterization on 5/11, performed by Dr. Ellyn Hack, demonstrated diffuse LAD disease with essentially subtotal occlusion that had the appearance of possible Spontaneous Coronary Artery Dissection. However, with existing CAD, simply diffuse disease was also possible. It was decided to not do any intervention at that time, but to wait and have the patient return, several days later, for a re-look cath to re-evaluate the LAD. She was maintained on medical therapy and had improvement in symtpoms. She returned to the cath lab on 5/16 for re-look. This was also performed by Dr. Ellyn Hack. She was noted to have progression of LAD disease to total occlusion at the original ~95% subtotal location with improved D1-distal LAD and R-L collaterals (septal and distal RPDA). The images were reviewd by Dr. Gwenlyn Found and Dr. Lia Foyer and it was concluded that the best course of action was not to proceed with extensive LAD PTCA-PCI, in the absence of on-going symptoms. Medical therapy was recommended. It was decided to start Plavix, increase her BB and to continue on her ACE-I and Imdur. She left the cath lab in stable condition.  Nuc study in 11/2012 was low risk.  Last echo 2010, EF >55%,  mild LV relaxation impairment.    Today she has irregular heartbeats as her greatest complaint. Sometimes it's rapid on and occurs off and on sometimes she'll have 2 episodes a month a really bad and other times she can go only once a month. She will rest and they typically go away after several hours. She also complains of feeling fatigued and swollen. She is exercising walking on the treadmill therefore her weight has increased fairly significantly. She also has some lower extremity edema as well. She gets short of breath with any exertion. Unfortunately she has adjusted her medications herself and she is only taking the Ranexa once a day metoprolol once a day the lisinopril once a day. And she stopped her Lipitor completely she will resume the Lipitor and resume medications as previously taken though on the metoprolol she will hold it to one dose a day until we review her event monitor.    Past Medical History  Diagnosis Date  . Esophageal stricture   . Duodenitis   . Gastritis   . Tubular adenoma polyp of rectum 2008  . Hypertension     2D ECHO, 09/30/2008 - EF >55%, normal: RENAL DOPPLER, 03/21/2007 - normal duplex study  . GERD (gastroesophageal reflux disease)   . IBS (irritable bowel syndrome)   . Ischemic colitis   . H/O hiatal hernia   . Lower GI bleeding     10/07/11 "I've had 3 episodes in the past year"  . History  of esophageal stricture   . Colon polyps   . Myocardial infarction 12/10/2012    "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)  . CAD (coronary artery disease), possible SCAD 12/14/2012  . Dyspnea on exertion     LEXISCAN, 09/30/2008 - normal, EKG negative for ischemia, no ECG changes  . Hypothyroidism   . Gout     "only from RX given after MI" (01/22/2013)  . Anxiety   . Angina effort 05/22/2013    Past Surgical History  Procedure Laterality Date  . Abdominal hysterectomy  1999  . Back surgery  09/2008  . Hammer toe surgery Bilateral ~ 2002  . Tonsillectomy  and adenoidectomy  1960's  . Esophageal dilation  08/2010  . Peripheral venous study  10/03/2008    No evidence of DVT, superficial thrombosis, or Baker's cyst  . Anterior lumbar fusion  2010    "L4-5" (01/22/2013)  . Tubal ligation  1985  . Esophageal dilation      "once or twice" (01/22/2013)  . Left heart catheterization with coronary angiogram N/A 12/11/2012    Procedure: LEFT HEART CATHETERIZATION WITH CORONARY ANGIOGRAM;  Surgeon: Leonie Man, MD;  Location: [facility] CATH LAB;  Service: Cardiovascular;  Laterality: N/A;  . Left heart catheterization with coronary angiogram N/A 12/15/2012    Procedure: LEFT HEART CATHETERIZATION WITH CORONARY ANGIOGRAM;  Surgeon: Leonie Man, MD;  Location: [facility] CATH LAB;  Service: Cardiovascular;  Laterality: N/A;     Current Outpatient Prescriptions  Medication Sig Dispense Refill  . ALPRAZolam (XANAX) 0.5 MG tablet Take 0.5 mg by mouth at bedtime as needed for anxiety.     Marland Kitchen amLODipine (NORVASC) 5 MG tablet TAKE 1 TABLET EVERY DAY 90 tablet 2  . aspirin EC 81 MG EC tablet Take 1 tablet (81 mg total) by mouth daily.    Marland Kitchen atorvastatin (LIPITOR) 40 MG tablet Take 1 tablet (40 mg total) by mouth daily at 6 PM. 30 tablet 9  . clopidogrel (PLAVIX) 75 MG tablet TAKE 1 TABLET BY MOUTH ONCE A DAY WITH BREAKFAST 30 tablet 6  . estradiol (ESTRACE) 0.5 MG tablet Take 1 mg by mouth daily.     . isosorbide mononitrate (IMDUR) 60 MG 24 hr tablet Take 1 tablet (60 mg total) by mouth daily. 90 tablet 6  . lisinopril (PRINIVIL,ZESTRIL) 5 MG tablet TAKE 1 TABLET (5 MG TOTAL) BY MOUTH 2 (TWO) TIMES DAILY. 60 tablet 5  . MAGNESIUM PO Take 1 tablet by mouth daily.    . metoprolol tartrate (LOPRESSOR) 25 MG tablet TAKE 1 TABLET (25 MG TOTAL) BY MOUTH 2 (TWO) TIMES DAILY. 60 tablet 5  . nitroGLYCERIN (NITROSTAT) 0.4 MG SL tablet Place 1 tablet (0.4 mg total) under the tongue every 5 (five) minutes as needed for chest pain. 25 tablet 5  . pantoprazole (PROTONIX) 40 MG tablet  TAKE 1 TABLET BY MOUTH TWICE A DAY 60 tablet 6  . RANEXA 1000 MG SR tablet TAKE 1 TABLET BY MOUTH 2 TIMES DAILY. 60 tablet 7  . [DISCONTINUED] phentermine 15 MG capsule Take 15 mg by mouth every morning.       No current facility-administered medications for this visit.    Allergies:   Review of patient's allergies indicates no known allergies.    Social History:  The patient  reports that she has never smoked. She has never used smokeless tobacco. She reports that she does not drink alcohol or use illicit drugs.   Family History:  The patient's family history includes  Colon cancer in her brother; Heart attack in her brother; Kidney disease in her mother; Stroke in her father.    ROS:  General:no colds or fevers, + weight changes-increase Skin:no rashes or ulcers HEENT:no blurred vision, no congestion CV:see HPI PUL:see HPI GI:no diarrhea constipation or melena, no indigestion GU:no hematuria, no dysuria MS:no joint pain, no claudication Neuro:no syncope, no lightheadedness Endo:no diabetes, no thyroid disease  Wt Readings from Last 3 Encounters:  10/09/14 212 lb 3.2 oz (96.253 kg)  04/25/14 206 lb 1.6 oz (93.486 kg)  10/08/13 197 lb (89.359 kg)     PHYSICAL EXAM: VS:  BP 122/86 mmHg  Pulse 66  Ht 5\' 3"  (1.6 m)  Wt 212 lb 3.2 oz (96.253 kg)  BMI 37.60 kg/m2 , BMI Body mass index is 37.6 kg/(m^2). General:Pleasant affect, NAD Skin:Warm and dry, brisk capillary refill HEENT:normocephalic, sclera clear, mucus membranes moist Neck:supple, no JVD, no bruits  Heart:S1S2 RRR without murmur, gallup, rub or click- rare premature beat Lungs:clear without rales, rhonchi, or wheezes JGO:TLXB, non tender, + BS, do not palpate liver spleen or masses Ext:no lower ext edema, 2+ pedal pulses, 2+ radial pulses Neuro:alert and oriented X3 , MAE, follows commands, + facial symmetry    EKG:  EKG is ordered today. The ekg ordered today demonstrates SR rate off 66 without  changes.   Recent Labs: 04/15/2014: ALT 15; BUN 11; Creatinine 0.90; Potassium 4.2; Pro B Natriuretic peptide (BNP) 168.70*; Sodium 138 04/25/2014: Hemoglobin 13.0; Platelets 328; TSH 4.241    Lipid Panel    Component Value Date/Time   CHOL 184 04/15/2014 0853   TRIG 145 04/15/2014 0853   HDL 63 04/15/2014 0853   CHOLHDL 2.9 04/15/2014 0853   VLDL 29 04/15/2014 0853   LDLCALC 92 04/15/2014 0853       Other studies Reviewed: Additional studies/ records that were reviewed today include: echo, nuc.   ASSESSMENT AND PLAN:  1.  HTN (hypertension) Controlled on current medications  2. Hyperlipidemia On statin therapy with recent lipid profile performed 04/15/14 revealing a total cholesterol of 184 , LDL of 92 HDL of 63- though had come off lipitor for several months- she will resume now.  3. CAD (coronary artery disease), possible SCAD History of occluded LAD with scar by Myoview stress testing.her ejection fraction by QGS was 55% The patient denies chest pain or shortness of breath.  4.  Edema, of lower ext. Begin HCTZ 12.5 mg daily.  5. Tachycardiapalpitations. And premature beats she will wear an event monitor for 30 days to evaluate if there is any paroxysmal atrial fib at this orifice only a sinus tachycardia. We will see her back in 1-2 months with myself for an abdomen Dr. Gwenlyn Found in 6 months that she has any abnormalities she may see him earlier than that.     Current medicines are reviewed with the patient today.  The patient Has no concerns regarding medicines.  The following changes have been made:  See above Labs/ tests ordered today include:see above  Disposition:   FU:  see above  Signed, Isaiah Serge, NP  10/09/2014 8:25 AM    [facility] [place], [place], [place], [place] Phone: [phone]; Fax: [phone]

## 2014-10-09 NOTE — Patient Instructions (Signed)
Your physician has recommended that you wear an event monitor for 30 days. Event monitors are medical devices that record the heart's electrical activity. Doctors most often [place] these monitors to diagnose arrhythmias. Arrhythmias are problems with the speed or rhythm of the heartbeat. The monitor is a small, portable device. You can wear one while you do your normal daily activities. This is usually used to diagnose what is causing palpitations/syncope (passing out).  START HCTZ 25mg  - take 1/2 tablet daily in the mornings.  TAKE Ranexa twice daily.  RESTART Atorvastatin daily in the evening.  Your physician recommends that you schedule a follow-up appointment in: 1-2 months with Cecilie [name], NP.  Your physician recommends that you schedule a follow-up appointment in: 6 months with Dr. Gwenlyn Found.

## 2014-10-31 ENCOUNTER — Telehealth: Payer: Self-pay | Admitting: Cardiovascular Disease

## 2014-10-31 NOTE — Telephone Encounter (Signed)
RE: 30 day event monitor Pt initiated monitor on 3/9 @ OV w/ Cecilie [name]  Pt contacted me to stated she'd had a lot of issues w/ the monitor she was wearing, mainly w/ batteries not holding life & machine cutting off. She had contacted [employer] a couple weeks ago and they had sent out replacement batteries for the monitor. Apparently device continued to have issues. Pt had become frustrated w/ this - called [place] to inform that she had d/ced wearing & mailed monitor back.  Informed her I would call [place]. Pt stated she does not need callback from [place] except for monitor results.  I reached out to [place] after pt call & informed them of possible equipment problem; they will f/u w/ patient.

## 2014-10-31 NOTE — Telephone Encounter (Signed)
Please call,question about the monitor she is wearing.

## 2014-11-05 ENCOUNTER — Other Ambulatory Visit: Payer: Self-pay | Admitting: Cardiovascular Disease

## 2014-11-05 NOTE — Telephone Encounter (Signed)
Rx(s) sent to pharmacy electronically.  

## 2014-11-13 ENCOUNTER — Ambulatory Visit: Payer: Federal, State, Local not specified - PPO | Admitting: Cardiology

## 2014-11-19 ENCOUNTER — Encounter: Payer: Self-pay | Admitting: Internal Medicine

## 2014-11-19 ENCOUNTER — Ambulatory Visit (INDEPENDENT_AMBULATORY_CARE_PROVIDER_SITE_OTHER): Payer: Federal, State, Local not specified - PPO | Admitting: Internal Medicine

## 2014-11-19 VITALS — BP 102/70 | HR 60 | Ht 63.0 in | Wt 210.2 lb

## 2014-11-19 DIAGNOSIS — K5900 Constipation, unspecified: Secondary | ICD-10-CM

## 2014-11-19 DIAGNOSIS — R1084 Generalized abdominal pain: Secondary | ICD-10-CM | POA: Diagnosis not present

## 2014-11-19 DIAGNOSIS — R1314 Dysphagia, pharyngoesophageal phase: Secondary | ICD-10-CM | POA: Diagnosis not present

## 2014-11-19 DIAGNOSIS — K219 Gastro-esophageal reflux disease without esophagitis: Secondary | ICD-10-CM | POA: Diagnosis not present

## 2014-11-19 DIAGNOSIS — K222 Esophageal obstruction: Secondary | ICD-10-CM

## 2014-11-19 DIAGNOSIS — Z86010 Personal history of colonic polyps: Secondary | ICD-10-CM

## 2014-11-19 NOTE — Patient Instructions (Signed)
Use miralax as discussed with Dr. Henrene Pastor,.  You will we due for a colon next year

## 2014-11-19 NOTE — Progress Notes (Signed)
HISTORY OF PRESENT ILLNESS:  [name] is a 59 y.o. female with multiple medical problems as listed below. She has been followed in this office for GERD complicated by peptic stricture requiring esophageal dilation and IBS. She presents today for evaluation of a new problem, constipation and abdominal pain. Next, ongoing management of her GERD. The patient was last seen February 2014. See that dictation for details. Since that time she was placed on Plavix for spontaneous coronary artery (LAD) dissection. Current history is that of 1 year's worth of constipation. She thinks this may be due to some of her medications. Does take occasional Colace without good results. She may go one week without a satisfactory bowel movement. Associated with progressive constipation is abdominal discomfort. Discomfort is relieved with defecation. She has had weight gain since her last visit of approximately 15 pounds. No bleeding. She denies any additional exacerbating or relieving factors. In terms of GERD, currently on pantoprazole 40 mg twice daily. Reflux symptoms are controlled. She has experienced very minor dysphagia at times, but does not believe she needs repeat dilation at this time. Finally, her last colonoscopy was January 2012 with diminutive colon polyp removed (no pathology). She has a personal history of adenomatous colon polyps and a family history of colon cancer in her brother she is due for routine surveillance around January 2017. Review of abdominal ultrasound February 2014 was unremarkable. Review of laboratories from September 2015 reveals normal hemoglobin of 13.0  REVIEW OF SYSTEMS:  All non-GI ROS negative except for anxiety, arthritis  Past Medical History  Diagnosis Date  . Esophageal stricture   . Duodenitis   . Gastritis   . Tubular adenoma polyp of rectum 2008  . Hypertension     2D ECHO, 09/30/2008 - EF >55%, normal: RENAL DOPPLER, 03/21/2007 - normal duplex study  . GERD  (gastroesophageal reflux disease)   . IBS (irritable bowel syndrome)   . Ischemic colitis   . H/O hiatal hernia   . Lower GI bleeding     10/07/11 "I've had 3 episodes in the past year"  . History of esophageal stricture   . Colon polyps   . Myocardial infarction 12/10/2012    "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)  . CAD (coronary artery disease), possible SCAD 12/14/2012  . Dyspnea on exertion     LEXISCAN, 09/30/2008 - normal, EKG negative for ischemia, no ECG changes  . Hypothyroidism   . Gout     "only from RX given after MI" (01/22/2013)  . Anxiety   . Angina effort 05/22/2013    Past Surgical History  Procedure Laterality Date  . Abdominal hysterectomy  1999  . Back surgery  09/2008  . Hammer toe surgery Bilateral ~ 2002  . Tonsillectomy and adenoidectomy  1960's  . Esophageal dilation  08/2010  . Peripheral venous study  10/03/2008    No evidence of DVT, superficial thrombosis, or Baker's cyst  . Anterior lumbar fusion  2010    "L4-5" (01/22/2013)  . Tubal ligation  1985  . Esophageal dilation      "once or twice" (01/22/2013)  . Left heart catheterization with coronary angiogram N/A 12/11/2012    Procedure: LEFT HEART CATHETERIZATION WITH CORONARY ANGIOGRAM;  Surgeon: Leonie Man, MD;  Location: [facility] CATH LAB;  Service: Cardiovascular;  Laterality: N/A;  . Left heart catheterization with coronary angiogram N/A 12/15/2012    Procedure: LEFT HEART CATHETERIZATION WITH CORONARY ANGIOGRAM;  Surgeon: Leonie Man, MD;  Location: [facility] CATH LAB;  Service: Cardiovascular;  Laterality: N/A;    Social History [name]  reports that she has never smoked. She has never used smokeless tobacco. She reports that she does not drink alcohol or use illicit drugs.  family history includes Colon cancer in her brother; Heart attack in her brother; Kidney disease in her mother; Stroke in her father.  No Known Allergies     PHYSICAL EXAMINATION: Vital signs: BP  102/70 mmHg  Pulse 60  Ht 5\' 3"  (1.6 m)  Wt 210 lb 3.2 oz (95.346 kg)  BMI 37.24 kg/m2  Constitutional: generally well-appearing, no acute distress Psychiatric: alert and oriented x3, cooperative Eyes: extraocular movements intact, anicteric, conjunctiva pink Mouth: oral pharynx moist, no lesions Neck: supple no lymphadenopathy Cardiovascular: heart regular rate and rhythm, no murmur Lungs: clear to auscultation bilaterally Abdomen: soft, obese, nontender, nondistended, no obvious ascites, no peritoneal signs, normal bowel sounds, no organomegaly Rectal: Omitted Extremities: no lower extremity edema bilaterally Skin: no lesions on visible extremities Neuro: No focal deficits.   ASSESSMENT:  #1. Constipation. Likely slow transit #2. Generalized abdominal discomfort. This, secondary to constipation #3. GERD, can by peptic stricture. Previous esophageal dilation. On twice a day pantoprazole with control of classic symptoms. However, some minor dysphagia for which she does not feel dilation as needed currently. #4. Personal history of adenomatous colon polyps and family history of colon cancer. Last colonoscopy January 2012. Surveillance up-to-date #5. Multiple medical problems including coronary artery disease for which she is on Plavix and aspirin   PLAN:  #1. Advised to initiate MiraLAX. We discussed dose titration to achieve desired effect. This should help with her abdominal discomfort. If not she was advised contact the office #2. Reflux precautions with attention to weight loss advised #3. Continue PPI. Continue to refill as needed #4. Esophageal dilation if worsening dysphagia were to occur. In the interim, chew food carefully #5. Surveillance colonoscopy next year. Interval follow-up if needed

## 2014-12-17 ENCOUNTER — Telehealth: Payer: Self-pay | Admitting: Cardiovascular Disease

## 2014-12-17 NOTE — Telephone Encounter (Signed)
Pt says she is under a lot of stress,would like you to call her something in to calm her down please.

## 2014-12-17 NOTE — Telephone Encounter (Signed)
Spoke with patient and advised her that cardiology does not prescribe anti-anxiety medications and most MDs will not prescribe this class of medications with out an evaluation. She did not seem happy with this information but was advised to contact her PCP for this type of prescription.

## 2015-02-04 ENCOUNTER — Telehealth: Payer: Self-pay | Admitting: Cardiovascular Disease

## 2015-02-04 NOTE — Telephone Encounter (Signed)
Returning your call. °

## 2015-02-04 NOTE — Telephone Encounter (Signed)
Scheduled for flex appt.

## 2015-02-04 NOTE — Telephone Encounter (Signed)
If she can't get in to see a flex. I'll see her in the office tomorrow

## 2015-02-04 NOTE — Telephone Encounter (Signed)
Left message for patient to call back  

## 2015-02-04 NOTE — Telephone Encounter (Signed)
Patient reports for past few days she has had chest pain mid-sternally. "Feels like same pain" she had a few years ago in LAD. She notes soreness. No dyspnea. No neck pain. Left arm "a little heavy".  She has refused to go to the ER yet in response to this problem. I recommended ER this morning - patient wants to see if she can be seen in office.  Noted no openings available here, called and left message w/ Jari Sportsman at [facility] for request of same-day flex appt.  Will route to Dr. Gwenlyn Found for additional considerations.

## 2015-02-04 NOTE — Telephone Encounter (Signed)
[name] is calling because she had a tore in her heart a couple of years back and the pain that she is having now feels the same as it was back then . Please call    Thanks

## 2015-02-06 ENCOUNTER — Ambulatory Visit (INDEPENDENT_AMBULATORY_CARE_PROVIDER_SITE_OTHER): Payer: Federal, State, Local not specified - PPO | Admitting: Cardiovascular Disease

## 2015-02-06 ENCOUNTER — Encounter: Payer: Self-pay | Admitting: Cardiology

## 2015-02-06 ENCOUNTER — Encounter (HOSPITAL_COMMUNITY): Payer: Self-pay | Admitting: Family Medicine

## 2015-02-06 ENCOUNTER — Observation Stay (HOSPITAL_COMMUNITY)
Admission: AD | Admit: 2015-02-06 | Discharge: 2015-02-07 | Disposition: A | Discharge status: Home / Self Care | Payer: Federal, State, Local not specified - PPO | Source: Ambulatory Visit | Attending: Cardiovascular Disease | Admitting: Cardiovascular Disease

## 2015-02-06 ENCOUNTER — Encounter (HOSPITAL_COMMUNITY)
Admission: AD | Disposition: A | Discharge status: Home / Self Care | Payer: Self-pay | Source: Ambulatory Visit | Attending: Cardiovascular Disease

## 2015-02-06 VITALS — BP 102/78 | HR 62 | Ht 63.0 in | Wt 212.0 lb

## 2015-02-06 DIAGNOSIS — E785 Hyperlipidemia, unspecified: Secondary | ICD-10-CM | POA: Insufficient documentation

## 2015-02-06 DIAGNOSIS — Z6835 Body mass index (BMI) 35.0-35.9, adult: Secondary | ICD-10-CM | POA: Diagnosis not present

## 2015-02-06 DIAGNOSIS — I2510 Atherosclerotic heart disease of native coronary artery without angina pectoris: Secondary | ICD-10-CM | POA: Diagnosis present

## 2015-02-06 DIAGNOSIS — I200 Unstable angina: Secondary | ICD-10-CM | POA: Diagnosis present

## 2015-02-06 DIAGNOSIS — Z7902 Long term (current) use of antithrombotics/antiplatelets: Secondary | ICD-10-CM | POA: Insufficient documentation

## 2015-02-06 DIAGNOSIS — I10 Essential (primary) hypertension: Secondary | ICD-10-CM | POA: Diagnosis not present

## 2015-02-06 DIAGNOSIS — Z79899 Other long term (current) drug therapy: Secondary | ICD-10-CM | POA: Diagnosis not present

## 2015-02-06 DIAGNOSIS — E669 Obesity, unspecified: Secondary | ICD-10-CM | POA: Diagnosis not present

## 2015-02-06 DIAGNOSIS — I208 Other forms of angina pectoris: Secondary | ICD-10-CM | POA: Diagnosis not present

## 2015-02-06 DIAGNOSIS — I25110 Atherosclerotic heart disease of native coronary artery with unstable angina pectoris: Secondary | ICD-10-CM | POA: Diagnosis not present

## 2015-02-06 DIAGNOSIS — E039 Hypothyroidism, unspecified: Secondary | ICD-10-CM | POA: Diagnosis present

## 2015-02-06 DIAGNOSIS — I252 Old myocardial infarction: Secondary | ICD-10-CM | POA: Insufficient documentation

## 2015-02-06 DIAGNOSIS — I2582 Chronic total occlusion of coronary artery: Secondary | ICD-10-CM | POA: Diagnosis not present

## 2015-02-06 DIAGNOSIS — K219 Gastro-esophageal reflux disease without esophagitis: Secondary | ICD-10-CM | POA: Diagnosis not present

## 2015-02-06 DIAGNOSIS — E782 Mixed hyperlipidemia: Secondary | ICD-10-CM

## 2015-02-06 DIAGNOSIS — I2542 Coronary artery dissection: Secondary | ICD-10-CM | POA: Diagnosis present

## 2015-02-06 HISTORY — PX: CARDIAC CATHETERIZATION: SHX172

## 2015-02-06 LAB — BASIC METABOLIC PANEL
ANION GAP: 9 (ref 5–15)
BUN: 17 mg/dL (ref 6–20)
CO2: 28 mmol/L (ref 22–32)
Calcium: 9.9 mg/dL (ref 8.9–10.3)
Chloride: 100 mmol/L — ABNORMAL LOW (ref 101–111)
Creatinine, Ser: 1.17 mg/dL — ABNORMAL HIGH (ref 0.44–1.00)
GFR, EST AFRICAN AMERICAN: 58 mL/min — AB (ref >60)
GFR, EST NON AFRICAN AMERICAN: 50 mL/min — AB (ref >60)
Glucose, Bld: 92 mg/dL (ref 65–99)
Potassium: 4.1 mmol/L (ref 3.5–5.1)
Sodium: 137 mmol/L (ref 135–145)

## 2015-02-06 LAB — CBC WITH DIFFERENTIAL/PLATELET
BASOS ABS: 0 10*3/uL (ref 0.0–0.1)
Basophils Relative: 0 % (ref 0–1)
EOS PCT: 2 % (ref 0–5)
Eosinophils Absolute: 0.2 10*3/uL (ref 0.0–0.7)
HEMATOCRIT: 35.8 % — AB (ref 36.0–46.0)
HEMOGLOBIN: 12.2 g/dL (ref 12.0–15.0)
Lymphocytes Relative: 22 % (ref 12–46)
Lymphs Abs: 1.6 10*3/uL (ref 0.7–4.0)
MCH: 31.1 pg (ref 26.0–34.0)
MCHC: 34.1 g/dL (ref 30.0–36.0)
MCV: 91.3 fL (ref 78.0–100.0)
MONO ABS: 0.7 10*3/uL (ref 0.1–1.0)
MONOS PCT: 10 % (ref 3–12)
Neutro Abs: 4.7 10*3/uL (ref 1.7–7.7)
Neutrophils Relative %: 66 % (ref 43–77)
Platelets: 279 10*3/uL (ref 150–400)
RBC: 3.92 MIL/uL (ref 3.87–5.11)
RDW: 13.8 % (ref 11.5–15.5)
WBC: 7.1 10*3/uL (ref 4.0–10.5)

## 2015-02-06 LAB — MRSA PCR SCREENING: MRSA by PCR: NEGATIVE

## 2015-02-06 LAB — PROTIME-INR
INR: 0.96 (ref 0.00–1.49)
Prothrombin Time: 13 seconds (ref 11.6–15.2)

## 2015-02-06 LAB — PLATELET INHIBITION P2Y12: PLATELET FUNCTION P2Y12: 130 [PRU] — AB (ref 194–418)

## 2015-02-06 LAB — TSH: TSH: 2.834 u[IU]/mL (ref 0.350–4.500)

## 2015-02-06 LAB — TROPONIN I: Troponin I: <0.03 ng/mL (ref <0.031)

## 2015-02-06 SURGERY — LEFT HEART CATH AND CORONARY ANGIOGRAPHY
Anesthesia: LOCAL

## 2015-02-06 MED ORDER — SODIUM CHLORIDE 0.9 % IV SOLN: 250.0000 mL | INTRAVENOUS | Status: DC | PRN

## 2015-02-06 MED ORDER — ACETAMINOPHEN 325 MG PO TABS: 650.0000 mg | ORAL_TABLET | ORAL | Status: DC | PRN

## 2015-02-06 MED ORDER — MIDAZOLAM HCL 2 MG/2ML IJ SOLN
INTRAMUSCULAR | Status: DC | PRN
  Administered 2015-02-06: 2 mg via INTRAVENOUS

## 2015-02-06 MED ORDER — ONDANSETRON HCL 4 MG/2ML IJ SOLN: 4.0000 mg | Freq: Four times a day (QID) | INTRAMUSCULAR | Status: DC | PRN

## 2015-02-06 MED ORDER — SODIUM CHLORIDE 0.9 % WEIGHT BASED INFUSION
1.0000 mL/kg/h | INTRAVENOUS | Status: DC
  Administered 2015-02-06: 1 mL/kg/h via INTRAVENOUS

## 2015-02-06 MED ORDER — ASPIRIN 300 MG RE SUPP: 300.0000 mg | RECTAL | Status: AC

## 2015-02-06 MED ORDER — HEPARIN (PORCINE) IN NACL 2-0.9 UNIT/ML-% IJ SOLN
INTRAMUSCULAR | Status: AC
  Filled 2015-02-06: qty 1000

## 2015-02-06 MED ORDER — HEPARIN SODIUM (PORCINE) 1000 UNIT/ML IJ SOLN
INTRAMUSCULAR | Status: AC
  Filled 2015-02-06: qty 1

## 2015-02-06 MED ORDER — ISOSORBIDE MONONITRATE ER 60 MG PO TB24
60.0000 mg | ORAL_TABLET | Freq: Every day | ORAL | Status: DC
  Administered 2015-02-07: 60 mg via ORAL
  Filled 2015-02-06: qty 1

## 2015-02-06 MED ORDER — SODIUM CHLORIDE 0.9 % IJ SOLN: 3.0000 mL | Freq: Two times a day (BID) | INTRAMUSCULAR | Status: DC

## 2015-02-06 MED ORDER — SODIUM CHLORIDE 0.9 % IJ SOLN: 3.0000 mL | INTRAMUSCULAR | Status: DC | PRN

## 2015-02-06 MED ORDER — METOPROLOL TARTRATE 25 MG PO TABS
25.0000 mg | ORAL_TABLET | Freq: Two times a day (BID) | ORAL | Status: DC
  Administered 2015-02-06: 25 mg via ORAL
  Filled 2015-02-06: qty 1

## 2015-02-06 MED ORDER — CLOPIDOGREL BISULFATE 75 MG PO TABS: 75.0000 mg | ORAL_TABLET | Freq: Every day | ORAL | Status: DC

## 2015-02-06 MED ORDER — ALPRAZOLAM 0.5 MG PO TABS: 0.5000 mg | ORAL_TABLET | Freq: Every evening | ORAL | Status: DC | PRN

## 2015-02-06 MED ORDER — SODIUM CHLORIDE 0.9 % IJ SOLN
3.0000 mL | Freq: Two times a day (BID) | INTRAMUSCULAR | Status: DC
  Administered 2015-02-06 (×2): 3 mL via INTRAVENOUS

## 2015-02-06 MED ORDER — NITROGLYCERIN 0.4 MG SL SUBL: 0.4000 mg | SUBLINGUAL_TABLET | SUBLINGUAL | Status: DC | PRN

## 2015-02-06 MED ORDER — AMLODIPINE BESYLATE 5 MG PO TABS
5.0000 mg | ORAL_TABLET | Freq: Every day | ORAL | Status: DC
  Administered 2015-02-07: 5 mg via ORAL
  Filled 2015-02-06: qty 1

## 2015-02-06 MED ORDER — NITROGLYCERIN 1 MG/10 ML FOR IR/CATH LAB
INTRA_ARTERIAL | Status: DC | PRN
  Administered 2015-02-06: 15:00:00

## 2015-02-06 MED ORDER — LISINOPRIL 5 MG PO TABS: 5.0000 mg | ORAL_TABLET | Freq: Every day | ORAL | Status: DC

## 2015-02-06 MED ORDER — FENTANYL CITRATE (PF) 100 MCG/2ML IJ SOLN
INTRAMUSCULAR | Status: AC
  Filled 2015-02-06: qty 2

## 2015-02-06 MED ORDER — MORPHINE SULFATE 2 MG/ML IJ SOLN: 2.0000 mg | INTRAMUSCULAR | Status: DC | PRN

## 2015-02-06 MED ORDER — ASPIRIN 81 MG PO CHEW
324.0000 mg | CHEWABLE_TABLET | ORAL | Status: AC
  Administered 2015-02-06: 324 mg via ORAL
  Filled 2015-02-06: qty 4

## 2015-02-06 MED ORDER — HEPARIN (PORCINE) IN NACL 100-0.45 UNIT/ML-% IJ SOLN
1000.0000 [IU]/h | INTRAMUSCULAR | Status: DC
  Administered 2015-02-06: 1000 [IU]/h via INTRAVENOUS
  Filled 2015-02-06: qty 250

## 2015-02-06 MED ORDER — PANTOPRAZOLE SODIUM 40 MG PO TBEC
40.0000 mg | DELAYED_RELEASE_TABLET | Freq: Two times a day (BID) | ORAL | Status: DC
  Administered 2015-02-06 – 2015-02-07 (×2): 40 mg via ORAL
  Filled 2015-02-06 (×2): qty 1

## 2015-02-06 MED ORDER — HEPARIN BOLUS VIA INFUSION
4000.0000 [IU] | Freq: Once | INTRAVENOUS | Status: AC
  Administered 2015-02-06: 4000 [IU] via INTRAVENOUS
  Filled 2015-02-06: qty 4000

## 2015-02-06 MED ORDER — ALPRAZOLAM 0.25 MG PO TABS: 0.2500 mg | ORAL_TABLET | Freq: Two times a day (BID) | ORAL | Status: DC | PRN

## 2015-02-06 MED ORDER — IOHEXOL 300 MG/ML  SOLN
INTRAMUSCULAR | Status: DC | PRN
  Administered 2015-02-06: 70 mL via INTRA_ARTERIAL

## 2015-02-06 MED ORDER — ZOLPIDEM TARTRATE 5 MG PO TABS: 5.0000 mg | ORAL_TABLET | Freq: Every evening | ORAL | Status: DC | PRN

## 2015-02-06 MED ORDER — SODIUM CHLORIDE 0.9 % WEIGHT BASED INFUSION
3.0000 mL/kg/h | INTRAVENOUS | Status: DC
  Administered 2015-02-06: 3 mL/kg/h via INTRAVENOUS

## 2015-02-06 MED ORDER — SODIUM CHLORIDE 0.9 % IJ SOLN
3.0000 mL | Freq: Two times a day (BID) | INTRAMUSCULAR | Status: DC
  Administered 2015-02-07: 3 mL via INTRAVENOUS

## 2015-02-06 MED ORDER — LIDOCAINE HCL (PF) 1 % IJ SOLN
INTRAMUSCULAR | Status: AC
  Filled 2015-02-06: qty 30

## 2015-02-06 MED ORDER — HEPARIN SODIUM (PORCINE) 1000 UNIT/ML IJ SOLN
INTRAMUSCULAR | Status: DC | PRN
  Administered 2015-02-06: 5000 [IU] via INTRAVENOUS

## 2015-02-06 MED ORDER — RANOLAZINE ER 500 MG PO TB12
1000.0000 mg | ORAL_TABLET | Freq: Two times a day (BID) | ORAL | Status: DC
  Administered 2015-02-06 – 2015-02-07 (×2): 1000 mg via ORAL
  Filled 2015-02-06 (×2): qty 2

## 2015-02-06 MED ORDER — FENTANYL CITRATE (PF) 100 MCG/2ML IJ SOLN
INTRAMUSCULAR | Status: DC | PRN
  Administered 2015-02-06: 25 ug via INTRAVENOUS

## 2015-02-06 MED ORDER — RADIAL COCKTAIL (HEPARIN/VERAPAMIL/LIDOCAINE/NITRO)
Status: DC | PRN
  Administered 2015-02-06: 1 via INTRA_ARTERIAL

## 2015-02-06 MED ORDER — VERAPAMIL HCL 2.5 MG/ML IV SOLN
INTRAVENOUS | Status: AC
  Filled 2015-02-06: qty 2

## 2015-02-06 MED ORDER — ASPIRIN EC 81 MG PO TBEC
81.0000 mg | DELAYED_RELEASE_TABLET | Freq: Every day | ORAL | Status: DC
  Administered 2015-02-07: 81 mg via ORAL
  Filled 2015-02-06: qty 1

## 2015-02-06 MED ORDER — SODIUM CHLORIDE 0.9 % WEIGHT BASED INFUSION: 3.0000 mL/kg/h | INTRAVENOUS | Status: AC

## 2015-02-06 MED ORDER — MAGNESIUM 200 MG PO TABS
1.0000 | ORAL_TABLET | Freq: Every day | ORAL | Status: DC
  Filled 2015-02-06 (×3): qty 1

## 2015-02-06 MED ORDER — CLOPIDOGREL BISULFATE 75 MG PO TABS
75.0000 mg | ORAL_TABLET | Freq: Every day | ORAL | Status: DC
Start: 2015-02-06 — End: 2015-02-07
  Administered 2015-02-07: 75 mg via ORAL
  Filled 2015-02-06 (×2): qty 1

## 2015-02-06 MED ORDER — ATORVASTATIN CALCIUM 40 MG PO TABS
40.0000 mg | ORAL_TABLET | Freq: Every day | ORAL | Status: DC
  Administered 2015-02-06: 40 mg via ORAL
  Filled 2015-02-06: qty 1

## 2015-02-06 MED ORDER — NITROGLYCERIN 1 MG/10 ML FOR IR/CATH LAB
INTRA_ARTERIAL | Status: AC
  Filled 2015-02-06: qty 10

## 2015-02-06 MED ORDER — MIDAZOLAM HCL 2 MG/2ML IJ SOLN
INTRAMUSCULAR | Status: AC
  Filled 2015-02-06: qty 2

## 2015-02-06 SURGICAL SUPPLY — 13 items
CATH INFINITI 5FR ANG PIGTAIL (CATHETERS) ×2
CATH INFINITI 5FR MULTPACK ANG (CATHETERS)
CATH OPTITORQUE TIG 4.0 5F (CATHETERS) ×2
DEVICE RAD COMP TR BAND LRG (VASCULAR PRODUCTS) ×2
GLIDESHEATH SLEND A-KIT 6F 22G (SHEATH) ×2
KIT HEART LEFT (KITS) ×2
PACK CARDIAC CATHETERIZATION (CUSTOM PROCEDURE TRAY) ×2
SHEATH PINNACLE 5F 10CM (SHEATH)
SYR MEDRAD MARK V 150ML (SYRINGE) ×2
TRANSDUCER W/STOPCOCK (MISCELLANEOUS) ×2
TUBING CIL FLEX 10 FLL-RA (TUBING) ×2
WIRE EMERALD 3MM-J .035X150CM (WIRE)
WIRE SAFE-T 1.5MM-J .035X260CM (WIRE) ×2

## 2015-02-06 NOTE — Progress Notes (Signed)
Cardiology Office Note   Date:  02/06/2015   ID:  [name], DOB 03-May-1956, MRN [id number]  PCP:  No primary care provider on file.  Cardiologist:  Dr. Adora Fridge     Chief Complaint  Patient presents with  . Chest Pain    since Friday      History of Present Illness: [name] is a 59 y.o. female who presents for chest pain.  In 12/11/12 after a prolonged episode of angina. Her EKG was without acute changes, but she ruled in for NSTEMI. Her troponin peaked at 4.95. She was placed on IV Heparin and underwent diagnostic coronary angiography. Her initial cardiac catheterization on 5/11, performed by Dr. Ellyn Hack, demonstrated diffuse LAD disease with essentially subtotal occlusion that had the appearance of possible Spontaneous Coronary Artery Dissection. However, with existing CAD, simply diffuse disease was also possible. It was decided to not do any intervention at that time, but to wait and have the patient return, several days later, for a re-look cath to re-evaluate the LAD. She was maintained on medical therapy and had improvement in symtpoms. She returned to the cath lab on 5/16 for re-look. This was also performed by Dr. Ellyn Hack. She was noted to have progression of LAD disease to total occlusion at the original ~95% subtotal location with improved D1-distal LAD and R-L collaterals (septal and distal RPDA). The images were reviewd by Dr. Gwenlyn Found and Dr. Lia Foyer and it was concluded that the best course of action was not to proceed with extensive LAD PTCA-PCI, in the absence of on-going symptoms. Medical therapy was recommended. It was decided to start Plavix, increase her BB and to continue on her ACE-I and Imdur. She left the cath lab in stable condition. Nuc study in 11/2012 was low risk. Last echo 2010, EF >55%, mild LV relaxation impairment. She does have disease of LCX and RCA.    Today presents with chest pain.  Pain began Friday and is constant and is similar to  her presenting pain before with NSTEMI.  She has not taken NTG due to severe headache. She has had occ episode of nausea. One episode of diaphoresis. Her last nuc in 2014 with LAD scar but no ischemia.         Past Medical History  Diagnosis Date  . Esophageal stricture   . Duodenitis   . Gastritis   . Tubular adenoma polyp of rectum 2008  . Hypertension     2D ECHO, 09/30/2008 - EF >55%, normal: RENAL DOPPLER, 03/21/2007 - normal duplex study  . GERD (gastroesophageal reflux disease)   . IBS (irritable bowel syndrome)   . Ischemic colitis   . H/O hiatal hernia   . Lower GI bleeding     10/07/11 "I've had 3 episodes in the past year"  . History of esophageal stricture   . Colon polyps   . Myocardial infarction 12/10/2012    "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)  . CAD (coronary artery disease), possible SCAD 12/14/2012  . Dyspnea on exertion     LEXISCAN, 09/30/2008 - normal, EKG negative for ischemia, no ECG changes  . Hypothyroidism   . Gout     "only from RX given after MI" (01/22/2013)  . Anxiety   . Angina effort 05/22/2013    Past Surgical History  Procedure Laterality Date  . Abdominal hysterectomy  1999  . Back surgery  09/2008  . Hammer toe surgery Bilateral ~ 2002  . Tonsillectomy and adenoidectomy  1960's  .  Esophageal dilation  08/2010  . Peripheral venous study  10/03/2008    No evidence of DVT, superficial thrombosis, or Baker's cyst  . Anterior lumbar fusion  2010    "L4-5" (01/22/2013)  . Tubal ligation  1985  . Esophageal dilation      "once or twice" (01/22/2013)  . Left heart catheterization with coronary angiogram N/A 12/11/2012    Procedure: LEFT HEART CATHETERIZATION WITH CORONARY ANGIOGRAM;  Surgeon: Leonie Man, [name];  Location: [facility] CATH LAB;  Service: Cardiovascular;  Laterality: N/A;  . Left heart catheterization with coronary angiogram N/A 12/15/2012    Procedure: LEFT HEART CATHETERIZATION WITH CORONARY ANGIOGRAM;  Surgeon: Leonie Man,  [name];  Location: [facility] CATH LAB;  Service: Cardiovascular;  Laterality: N/A;     Current Outpatient Prescriptions  Medication Sig Dispense Refill  . ALPRAZolam (XANAX) 0.5 MG tablet Take 0.5 mg by mouth at bedtime as needed for anxiety.     Marland Kitchen amLODipine (NORVASC) 5 MG tablet TAKE 1 TABLET EVERY DAY 90 tablet 2  . aspirin EC 81 MG EC tablet Take 1 tablet (81 mg total) by mouth daily.    Marland Kitchen atorvastatin (LIPITOR) 40 MG tablet Take 1 tablet (40 mg total) by mouth daily at 6 PM. 30 tablet 9  . clopidogrel (PLAVIX) 75 MG tablet TAKE 1 TABLET BY MOUTH ONCE A DAY WITH BREAKFAST 30 tablet 6  . estradiol (ESTRACE) 0.5 MG tablet Take 1 mg by mouth daily.     . hydrochlorothiazide (HYDRODIURIL) 25 MG tablet Take 0.5 tablets (12.5 mg total) by mouth daily. 45 tablet 3  . isosorbide mononitrate (IMDUR) 60 MG 24 hr tablet Take 1 tablet (60 mg total) by mouth daily. 90 tablet 3  . lisinopril (PRINIVIL,ZESTRIL) 5 MG tablet Take 1 tablet (5 mg total) by mouth daily. 90 tablet 3  . MAGNESIUM PO Take 1 tablet by mouth daily.    . metoprolol tartrate (LOPRESSOR) 25 MG tablet TAKE 1 TABLET BY MOUTH 2 TIMES DAILY. 60 tablet 11  . nitroGLYCERIN (NITROSTAT) 0.4 MG SL tablet Place 1 tablet (0.4 mg total) under the tongue every 5 (five) minutes as needed for chest pain. 25 tablet 5  . pantoprazole (PROTONIX) 40 MG tablet TAKE 1 TABLET BY MOUTH TWICE A DAY 60 tablet 6  . RANEXA 1000 MG SR tablet TAKE 1 TABLET BY MOUTH 2 TIMES DAILY. 60 tablet 7  . [DISCONTINUED] phentermine 15 MG capsule Take 15 mg by mouth every morning.       No current facility-administered medications for this visit.    Allergies:   Review of patient's allergies indicates no known allergies.    Social History:  The patient  reports that she has never smoked. She has never used smokeless tobacco. She reports that she does not drink alcohol or use illicit drugs.   Family History:  The patient's family history includes Colon cancer in her brother;  Heart attack in her brother; Kidney disease in her mother; Stroke in her father.    ROS:  General:no colds or fevers, no weight changes Skin:no rashes or ulcers HEENT:no blurred vision, no congestion CV:see HPI PUL:see HPI GI:no diarrhea constipation or melena, no indigestion GU:no hematuria, no dysuria MS:no joint pain, no claudication Neuro:no syncope, no lightheadedness Endo:no diabetes, no thyroid disease  Wt Readings from Last 3 Encounters:  02/06/15 212 lb (96.163 kg)  11/19/14 210 lb 3.2 oz (95.346 kg)  10/09/14 212 lb 3.2 oz (96.253 kg)     PHYSICAL EXAM: VS:  BP 102/78 mmHg  Pulse 62  Ht 5\' 3"  (1.6 m)  Wt 212 lb (96.163 kg)  BMI 37.56 kg/m2 , BMI Body mass index is 37.56 kg/(m^2). General:Pleasant affect, NAD Skin:Warm and dry, brisk capillary refill HEENT:normocephalic, sclera clear, mucus membranes moist Neck:supple, no JVD, no bruits  Heart:S1S2 RRR without murmur, gallup, rub or click Lungs:clear without rales, rhonchi, or wheezes VHQ:IONG, non tender, + BS, do not palpate liver spleen or masses Ext:no lower ext edema, 2+ pedal pulses, 2+ radial pulses Neuro:alert and oriented, MAE, follows commands, + facial symmetry    EKG:  EKG is ordered today. The ekg ordered today demonstrates SB no acute changes.    Recent Labs: 04/15/2014: ALT 15; BUN 11; Creat 0.90; Potassium 4.2; Pro B Natriuretic peptide (BNP) 168.70*; Sodium 138 04/25/2014: Hemoglobin 13.0; Platelets 328; TSH 4.241    Lipid Panel    Component Value Date/Time   CHOL 184 04/15/2014 0853   TRIG 145 04/15/2014 0853   HDL 63 04/15/2014 0853   CHOLHDL 2.9 04/15/2014 0853   VLDL 29 04/15/2014 0853   LDLCALC 92 04/15/2014 0853       Other studies Reviewed: Additional studies/ records that were reviewed today include: cardiac cath and nuc study and previous notes.    ASSESSMENT AND PLAN: Unstable angina plan for cardiac cath today after labs back.  Admit to ICU for management of her chest  pain.  Serial troponins.  She is on BB,CCB, ranexa ACE, imdur, ASA plavix.   CAD (coronary artery disease), possible SCAD History of occluded LAD with scar by Myoview stress testing.her ejection fraction by QGS was 55% The patient denies chest pain or shortness of breath.  concern with 3 vessel disease by last cath.   HTN (hypertension) Controlled on current medications  Hyperlipidemia On statin therapy with recent lipid profile performed 04/15/14 revealing a total cholesterol of 184 , LDL of 92 HDL of 63  Dr. Acie Fredrickson has seen and examined.       Current medicines are reviewed with the patient today.  The patient Has no concerns regarding medicines.  The following changes have been made:  See above Labs/ tests ordered today include:see above  Disposition:   FU:  see above  Signed, Sabrina Serge, [name]  02/06/2015 9:26 AM    [facility] [place], [place], [place], [place] Phone: [phone]; Fax: [phone]  Attending Note:   The patient was seen and examined.  Agree with assessment and plan as noted above.  Changes made to the above note as needed.  Pt has severe disease of the LAD with moderate diseas of the LCX and RCA She presents with significant pain similar to her previous pains. Would admit and proceed with cath    Sabrina Dial., [name], [facility] 02/06/2015, 8:53 PM [address],  [place] Pager [phone]

## 2015-02-06 NOTE — Patient Instructions (Signed)
Patient being admitted to [place]  Please have a friend or family member take you straight to admitting at [facility] and they will direct you from there.

## 2015-02-06 NOTE — Progress Notes (Signed)
ANTICOAGULATION CONSULT NOTE - Initial Consult  Pharmacy Consult for heparin Indication: chest pain/ACS  No Known Allergies  Patient Measurements: Height: 5\' 3"  (160 cm) Weight: 212 lb 11.9 oz (96.5 kg) IBW/kg (Calculated) : 52.4 Heparin Dosing Weight: 75kg  Vital Signs: Temp: 97.6 F (36.4 C) (07/07 1105) Temp Source: Oral (07/07 1105) BP: 134/66 mmHg (07/07 1200) Pulse Rate: 55 (07/07 1200)  Labs: No results for input(s): HGB, HCT, PLT, APTT, LABPROT, INR, HEPARINUNFRC, CREATININE, CKTOTAL, CKMB, TROPONINI in the last 72 hours.  CrCl cannot be calculated (Patient has no serum creatinine result on file.).   Medical History: Past Medical History  Diagnosis Date  . Esophageal stricture   . Duodenitis   . Gastritis   . Tubular adenoma polyp of rectum 2008  . Hypertension     2D ECHO, 09/30/2008 - EF >55%, normal: RENAL DOPPLER, 03/21/2007 - normal duplex study  . GERD (gastroesophageal reflux disease)   . IBS (irritable bowel syndrome)   . Ischemic colitis   . H/O hiatal hernia   . Lower GI bleeding     10/07/11 "I've had 3 episodes in the past year"  . History of esophageal stricture   . Colon polyps   . Myocardial infarction 12/10/2012    "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)  . CAD (coronary artery disease), possible SCAD 12/14/2012  . Dyspnea on exertion     LEXISCAN, 09/30/2008 - normal, EKG negative for ischemia, no ECG changes  . Hypothyroidism   . Gout     "only from RX given after MI" (01/22/2013)  . Anxiety   . Angina effort 05/22/2013   Assessment: 59 y.o. female who presents for chest pain. Dx with LAD disease in 2014 but medical management was opted for instead of PCI. Patient was then started on plavix. Note she has history of GIB from 2013, she is not on anticoagulation pta only asa and plavix. Baseline labs pending.  Goal of Therapy:  Heparin level 0.3-0.7 units/ml Monitor platelets by anticoagulation protocol: Yes   Plan:  Give 4000  units bolus x 1 Start heparin infusion at 1000 units/hr Check anti-Xa level in 6 hours and daily while on heparin Continue to monitor H&H and platelets  Erin Hearing PharmD., BCPS Clinical Pharmacist Pager [phone] 02/06/2015 12:24 PM

## 2015-02-06 NOTE — Interval H&P Note (Signed)
And heHistory and Physical Interval Note:  02/06/2015 12:54 PM  [name]  has presented today for surgery, with the diagnosis of unstable angina.  The various methods of treatment have been discussed with the patient and family. After consideration of risks, benefits and other options for treatment, the patient has consented to  Procedure(s): Left Heart Cath and Coronary Angiography (N/A) with Possible Percutaneous Coronary Intervention as a surgical intervention .  The patient's history has been reviewed, patient examined, no change in status, stable for surgery.  I have reviewed the patient's chart and labs.  Questions were answered to the patient's satisfaction.    Cath Lab Visit (complete for each Cath Lab visit)  Clinical Evaluation Leading to the Procedure:   ACS: Yes.    Non-ACS:    Anginal Classification: CCS III  Anti-ischemic medical therapy: Maximal Therapy (2 or more classes of medications)  Non-Invasive Test Results: No non-invasive testing performed  Prior CABG: No previous CABG   AUC for Dx Cath: CAD Assessment (Coronary Angiography With or Without Left Heart Catheterization and/or Left Ventriculography)  Patient Information:  Suspected ACS, Intermediate Risk  AUC Score:   A (8)   Indication:   4   AUC for PCI: TIMI SCORE  Patient Information:  TIMI Score is 3  UA/NSTEMI and intermediate-risk features (e.g., TIMI score 3?4) for short-term risk of death or nonfatal MI  Revascularization of the presumed culprit artery   A (9)  Indication: 10; Score: 9   HARDING, DAVID W

## 2015-02-06 NOTE — H&P (Signed)
ID: CHAD TIZNADO, DOB September 05, 1955, MRN [id number]   HISTORY AND PHYSICAL    PCP: No primary care provider on file. Cardiologist: Dr. Adora Fridge    Chief Complaint  Patient presents with  . Chest Pain    since Friday     History of Present Illness: [name] is a 59 y.o. female who presents for chest pain.  In 12/11/12 after a prolonged episode of angina. Her EKG was without acute changes, but she ruled in for NSTEMI. Her troponin peaked at 4.95. She was placed on IV Heparin and underwent diagnostic coronary angiography. Her initial cardiac catheterization on 5/11, performed by Dr. Ellyn Hack, demonstrated diffuse LAD disease with essentially subtotal occlusion that had the appearance of possible Spontaneous Coronary Artery Dissection. However, with existing CAD, simply diffuse disease was also possible. It was decided to not do any intervention at that time, but to wait and have the patient return, several days later, for a re-look cath to re-evaluate the LAD. She was maintained on medical therapy and had improvement in symtpoms. She returned to the cath lab on 5/16 for re-look. This was also performed by Dr. Ellyn Hack. She was noted to have progression of LAD disease to total occlusion at the original ~95% subtotal location with improved D1-distal LAD and R-L collaterals (septal and distal RPDA). The images were reviewd by Dr. Gwenlyn Found and Dr. Lia Foyer and it was concluded that the best course of action was not to proceed with extensive LAD PTCA-PCI, in the absence of on-going symptoms. Medical therapy was recommended. It was decided to start Plavix, increase her BB and to continue on her ACE-I and Imdur. She left the cath lab in stable condition. Nuc study in 11/2012 was low risk. Last echo 2010, EF >55%, mild LV relaxation impairment. She does have disease of LCX and RCA.   Today presents with chest pain. Pain began Friday and is constant and is similar to  her presenting pain before with NSTEMI. She has not taken NTG due to severe headache. She has had occ episode of nausea. One episode of diaphoresis. Her last nuc in 2014 with LAD scar but no ischemia.    Past Medical History  Diagnosis Date  . Esophageal stricture   . Duodenitis   . Gastritis   . Tubular adenoma polyp of rectum 2008  . Hypertension     2D ECHO, 09/30/2008 - EF >55%, normal: RENAL DOPPLER, 03/21/2007 - normal duplex study  . GERD (gastroesophageal reflux disease)   . IBS (irritable bowel syndrome)   . Ischemic colitis   . H/O hiatal hernia   . Lower GI bleeding     10/07/11 "I've had 3 episodes in the past year"  . History of esophageal stricture   . Colon polyps   . Myocardial infarction 12/10/2012    "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)  . CAD (coronary artery disease), possible SCAD 12/14/2012  . Dyspnea on exertion     LEXISCAN, 09/30/2008 - normal, EKG negative for ischemia, no ECG changes  . Hypothyroidism   . Gout     "only from RX given after MI" (01/22/2013)  . Anxiety   . Angina effort 05/22/2013    Past Surgical History  Procedure Laterality Date  . Abdominal hysterectomy  1999  . Back surgery  09/2008  . Hammer toe surgery Bilateral ~ 2002  . Tonsillectomy and adenoidectomy  1960's  . Esophageal dilation  08/2010  . Peripheral venous study  10/03/2008    No evidence  of DVT, superficial thrombosis, or Baker's cyst  . Anterior lumbar fusion  2010    "L4-5" (01/22/2013)  . Tubal ligation  1985  . Esophageal dilation      "once or twice" (01/22/2013)  . Left heart catheterization with coronary angiogram N/A 12/11/2012    Procedure: LEFT HEART CATHETERIZATION WITH CORONARY ANGIOGRAM; Surgeon: Leonie Man, MD; Location: [facility] CATH LAB; Service: Cardiovascular; Laterality: N/A;  . Left heart  catheterization with coronary angiogram N/A 12/15/2012    Procedure: LEFT HEART CATHETERIZATION WITH CORONARY ANGIOGRAM; Surgeon: Leonie Man, MD; Location: [facility] CATH LAB; Service: Cardiovascular; Laterality: N/A;     Current Outpatient Prescriptions  Medication Sig Dispense Refill  . ALPRAZolam (XANAX) 0.5 MG tablet Take 0.5 mg by mouth at bedtime as needed for anxiety.     Marland Kitchen amLODipine (NORVASC) 5 MG tablet TAKE 1 TABLET EVERY DAY 90 tablet 2  . aspirin EC 81 MG EC tablet Take 1 tablet (81 mg total) by mouth daily.    Marland Kitchen atorvastatin (LIPITOR) 40 MG tablet Take 1 tablet (40 mg total) by mouth daily at 6 PM. 30 tablet 9  . clopidogrel (PLAVIX) 75 MG tablet TAKE 1 TABLET BY MOUTH ONCE A DAY WITH BREAKFAST 30 tablet 6  . estradiol (ESTRACE) 0.5 MG tablet Take 1 mg by mouth daily.     . hydrochlorothiazide (HYDRODIURIL) 25 MG tablet Take 0.5 tablets (12.5 mg total) by mouth daily. 45 tablet 3  . isosorbide mononitrate (IMDUR) 60 MG 24 hr tablet Take 1 tablet (60 mg total) by mouth daily. 90 tablet 3  . lisinopril (PRINIVIL,ZESTRIL) 5 MG tablet Take 1 tablet (5 mg total) by mouth daily. 90 tablet 3  . MAGNESIUM PO Take 1 tablet by mouth daily.    . metoprolol tartrate (LOPRESSOR) 25 MG tablet TAKE 1 TABLET BY MOUTH 2 TIMES DAILY. 60 tablet 11  . nitroGLYCERIN (NITROSTAT) 0.4 MG SL tablet Place 1 tablet (0.4 mg total) under the tongue every 5 (five) minutes as needed for chest pain. 25 tablet 5  . pantoprazole (PROTONIX) 40 MG tablet TAKE 1 TABLET BY MOUTH TWICE A DAY 60 tablet 6  . RANEXA 1000 MG SR tablet TAKE 1 TABLET BY MOUTH 2 TIMES DAILY. 60 tablet 7  . [DISCONTINUED] phentermine 15 MG capsule Take 15 mg by mouth every morning.      No current facility-administered medications for this visit.    Allergies: Review of patient's allergies indicates no known allergies.    Social History: The  patient  reports that she has never smoked. She has never used smokeless tobacco. She reports that she does not drink alcohol or use illicit drugs.   Family History: The patient's family history includes Colon cancer in her brother; Heart attack in her brother; Kidney disease in her mother; Stroke in her father.    ROS: General:no colds or fevers, no weight changes Skin:no rashes or ulcers HEENT:no blurred vision, no congestion CV:see HPI PUL:see HPI GI:no diarrhea constipation or melena, no indigestion GU:no hematuria, no dysuria MS:no joint pain, no claudication Neuro:no syncope, no lightheadedness Endo:no diabetes, no thyroid disease  Wt Readings from Last 3 Encounters:  02/06/15 212 lb (96.163 kg)  11/19/14 210 lb 3.2 oz (95.346 kg)  10/09/14 212 lb 3.2 oz (96.253 kg)     PHYSICAL EXAM: VS: BP 102/78 mmHg  Pulse 62  Ht 5\' 3"  (1.6 m)  Wt 212 lb (96.163 kg)  BMI 37.56 kg/m2 , BMI Body mass index is 37.56 kg/(m^2).  General:Pleasant affect, NAD Skin:Warm and dry, brisk capillary refill HEENT:normocephalic, sclera clear, mucus membranes moist Neck:supple, no JVD, no bruits  Heart:S1S2 RRR without murmur, gallup, rub or click Lungs:clear without rales, rhonchi, or wheezes PPH:KFEX, non tender, + BS, do not palpate liver spleen or masses Ext:no lower ext edema, 2+ pedal pulses, 2+ radial pulses Neuro:alert and oriented, MAE, follows commands, + facial symmetry    EKG: EKG is ordered today. The ekg ordered today demonstrates SB no acute changes.    Recent Labs: 04/15/2014: ALT 15; BUN 11; Creat 0.90; Potassium 4.2; Pro B Natriuretic peptide (BNP) 168.70*; Sodium 138 04/25/2014: Hemoglobin 13.0; Platelets 328; TSH 4.241    Lipid Panel  Labs (Brief)       Component Value Date/Time   CHOL 184 04/15/2014 0853   TRIG 145 04/15/2014 0853   HDL 63 04/15/2014 0853   CHOLHDL 2.9 04/15/2014 0853   VLDL 29 04/15/2014 0853   LDLCALC 92  04/15/2014 0853        Other studies Reviewed: Additional studies/ records that were reviewed today include: cardiac cath and nuc study and previous notes.    ASSESSMENT AND PLAN: Unstable angina plan for cardiac cath today after labs back. Admit to ICU for management of her chest pain. Serial troponins. She is on BB,CCB, ranexa ACE, imdur, ASA plavix.   CAD (coronary artery disease), possible SCAD History of occluded LAD with scar by Myoview stress testing.her ejection fraction by QGS was 55% The patient denies chest pain or shortness of breath. concern with 3 vessel disease by last cath.  HTN (hypertension) Controlled on current medications  Hyperlipidemia On statin therapy with recent lipid profile performed 04/15/14 revealing a total cholesterol of 184 , LDL of 92 HDL of 63  Dr. Acie Fredrickson has seen and examined.       Current medicines are reviewed with the patient today. The patient Has no concerns regarding medicines.  The following changes have been made: See above Labs/ tests ordered today include:see above  Disposition: FU: see above  Signed, Sabrina Serge, NP  02/06/2015 9:26 AM  [facility] [place], [place], [place], [place] Phone: [phone]; Fax: [phone]       Attending Note:   The patient was seen and examined.  Agree with assessment and plan as noted above.  Changes made to the above note as needed.  Pt has recurrent CP. Has symptoms similar to her previous CP. Has known disease in the RCA and LCx.   Has an occluded LAD Agree with plans to re-cath. Will admit to the hospital    Ramond Dial., MD, [facility] 02/06/2015, 12:47 PM [address],  [place] Pager [phone]

## 2015-02-06 NOTE — Progress Notes (Addendum)
pt HR SB 50s-60s with SBP 113 at time of scheduled admin of metoprolol 25mg ; MD Tommi Rumps paged for ok to still give this medicine. Will continue to monitor and await response.  Addendum @ 2147; per MD Tommi Rumps, as long as HR above 50, ok to administer scheduled metoprolol.

## 2015-02-07 ENCOUNTER — Encounter (HOSPITAL_COMMUNITY): Payer: Self-pay | Admitting: Cardiology

## 2015-02-07 ENCOUNTER — Telehealth: Payer: Self-pay | Admitting: Cardiovascular Disease

## 2015-02-07 DIAGNOSIS — I2510 Atherosclerotic heart disease of native coronary artery without angina pectoris: Secondary | ICD-10-CM

## 2015-02-07 DIAGNOSIS — I25110 Atherosclerotic heart disease of native coronary artery with unstable angina pectoris: Secondary | ICD-10-CM | POA: Diagnosis not present

## 2015-02-07 DIAGNOSIS — E785 Hyperlipidemia, unspecified: Secondary | ICD-10-CM | POA: Diagnosis not present

## 2015-02-07 DIAGNOSIS — I200 Unstable angina: Secondary | ICD-10-CM | POA: Diagnosis not present

## 2015-02-07 DIAGNOSIS — I10 Essential (primary) hypertension: Secondary | ICD-10-CM

## 2015-02-07 LAB — COMPREHENSIVE METABOLIC PANEL
ALBUMIN: 2.9 g/dL — AB (ref 3.5–5.0)
ALT: 12 U/L — ABNORMAL LOW (ref 14–54)
AST: 14 U/L — ABNORMAL LOW (ref 15–41)
Alkaline Phosphatase: 62 U/L (ref 38–126)
Anion gap: 6 (ref 5–15)
BUN: 13 mg/dL (ref 6–20)
CALCIUM: 9 mg/dL (ref 8.9–10.3)
CHLORIDE: 104 mmol/L (ref 101–111)
CO2: 27 mmol/L (ref 22–32)
Creatinine, Ser: 1.1 mg/dL — ABNORMAL HIGH (ref 0.44–1.00)
GFR calc Af Amer: >60 mL/min (ref >60)
GFR calc non Af Amer: 54 mL/min — ABNORMAL LOW (ref >60)
Glucose, Bld: 100 mg/dL — ABNORMAL HIGH (ref 65–99)
Potassium: 4 mmol/L (ref 3.5–5.1)
SODIUM: 137 mmol/L (ref 135–145)
TOTAL PROTEIN: 6.4 g/dL — AB (ref 6.5–8.1)
Total Bilirubin: 0.4 mg/dL (ref 0.3–1.2)

## 2015-02-07 LAB — CBC
HCT: 33.9 % — ABNORMAL LOW (ref 36.0–46.0)
HEMOGLOBIN: 11.7 g/dL — AB (ref 12.0–15.0)
MCH: 31.9 pg (ref 26.0–34.0)
MCHC: 34.5 g/dL (ref 30.0–36.0)
MCV: 92.4 fL (ref 78.0–100.0)
Platelets: 260 10*3/uL (ref 150–400)
RBC: 3.67 MIL/uL — AB (ref 3.87–5.11)
RDW: 13.9 % (ref 11.5–15.5)
WBC: 5.9 10*3/uL (ref 4.0–10.5)

## 2015-02-07 LAB — LIPID PANEL
CHOLESTEROL: 265 mg/dL — AB (ref 0–200)
HDL: 60 mg/dL (ref >40)
LDL Cholesterol: 167 mg/dL — ABNORMAL HIGH (ref 0–99)
Total CHOL/HDL Ratio: 4.4 RATIO
Triglycerides: 192 mg/dL — ABNORMAL HIGH (ref <150)
VLDL: 38 mg/dL (ref 0–40)

## 2015-02-07 LAB — HEMOGLOBIN A1C
Hgb A1c MFr Bld: 5.3 % (ref 4.8–5.6)
MEAN PLASMA GLUCOSE: 105 mg/dL

## 2015-02-07 LAB — TROPONIN I

## 2015-02-07 MED ORDER — AMLODIPINE BESYLATE 5 MG PO TABS: 5.0000 mg | ORAL_TABLET | Freq: Every day | ORAL | Status: DC

## 2015-02-07 MED ORDER — MAGNESIUM OXIDE 400 (241.3 MG) MG PO TABS
200.0000 mg | ORAL_TABLET | Freq: Every day | ORAL | Status: DC
  Administered 2015-02-07: 200 mg via ORAL

## 2015-02-07 NOTE — Telephone Encounter (Signed)
Patient  needs a call on Monday 02/10/15

## 2015-02-07 NOTE — Discharge Summary (Signed)
Discharge Summary   Patient ID: [name],  MRN: [id number], DOB/AGE: 1956-05-06 59 y.o.  Admit date: 02/06/2015 Discharge date: 02/07/2015  Primary Care Provider: No primary care provider on file. Primary Cardiologist: Adora Fridge, MD   Discharge Diagnoses Principal Problem:   Unstable angina  **s/p catheterization this admission revealing stable, severe LAD and branch vessel disease.  No targets for PCI Medical Rx.  Active Problems:   CAD (coronary artery disease), h/o possible SCAD   Essential hypertension   Obesity (BMI 35.0-39.9 without comorbidity)   GERD   Hypothyroidism   Hyperlipidemia  Allergies No Known Allergies  Procedures  Cardiac Catheterization 02/06/2015  Coronary Findings     Dominance: Right    Left Main  The vessel was injected is large . TIG 4.0 Catheter used.      Left Anterior Descending  The vessel is normal in caliber . There is severe diffuse disease throughout the vessel. from just after D1 takeoff, the vessel appears to be small & tapers to occlusion in the mid vessel The vessel was previously described as appearing like a spontaneous coronary dissection. It looks almost identical to 2014.   . Mid LAD to Dist LAD lesion, 100% stenosed. diffuse chronic total occlusion and has bridging right-to-left left-to-left collateral flow.   . First Diagonal Branch   The vessel is moderate in size.   . First Septal Branch   The vessel is moderate in size.   Colon Flattery 1st Sept lesion, 90% stenosed. discrete located at the major branch .      Ramus Intermedius  The vessel is small .      Left Circumflex  The vessel is large .   Marland Kitchen First Obtuse Marginal Branch   The vessel is moderate in size.   Marland Kitchen Second Obtuse Marginal Branch   The vessel is small in size.   Colon Flattery 2nd Mrg to 2nd Mrg lesion, 90% stenosed. discrete located at the major branch . Too Small to consider PCI   . Third Obtuse Marginal Branch   The vessel is large in size.   Colon Flattery 3rd Mrg to  3rd Mrg lesion, 60% stenosed. diffuse eccentric . Stable to improved from 11/2012 (was called ~70% at that time)   . Lateral Third Obtuse Marginal Branch   The vessel is small in size.      Right Coronary Artery  The vessel was injected is normal in caliber . TIG 4.0 Catheter used. The vessel exhibits minimal luminal irregularities.   . Mid RCA lesion, 35% stenosed. tubular .   Marland Kitchen Dist RCA lesion, 45% stenosed. discrete located at the bend .   Marland Kitchen Acute Marginal Branch   The vessel is small in size.   . Right Posterior Descending Artery   The vessel is normal in size and exhibits minimal luminal irregularities. Essentially identical to 2014   . RPDA lesion, 40% stenosed. discrete .   Marland Kitchen Right Posterior Atrioventricular Branch   The vessel is small in size.    Left Ventricle The left ventricular size is normal. The left ventricular systolic function is normal. There are wall motion abnormalities in the left ventricle. mild apical hypokinesis There are segmental wall motion abnormalities in the left ventricle.   Mitral Valve There is no mitral valve stenosis and no mitral valve regurgitation.   Aortic Valve There is no aortic valve stenosis, and no aortic valve regurgitation. _____________   History of Present Illness  59 y/o female with a h/o  CAD and possible spontaneous coronary artery dissection of the LAD dating back to 11/2009.  She has been medically managed over the years and has had some degree of chronic angina requiring titration of nitrate and addition of ranexa therapies.  She underwent relook cath in 11/2012 secondary to worsening chest pain and was noted to have progression of her LAD disease along with branch vessel disease.  She was started on plavix and meds wee titrated.  She's done reasonably well but on 7/7, she presented to the office for evaluation related to recurrent chest pain.  Decision was made to admit her for further evaluation and catheterization.  Hospital  Course  Patient ruled out for MI.  She underwent diagnostic catheterization on 7/7 revealing stable, severe LAD disease with severe septal perforator and OM2 disease, and moderate nonobstructive diagonal and RCA disease.  Overall there was no significant change in anatomy from prior catheterization and there were no clear targets for intervention.  Continued medical therapy was recommended and she was maintained on bb, ccb, nitrate, and ranexa therapy.  She has been ambulating without difficulty and has had no further chest pain.  She will be discharged home today in good condition.  If she continues to have angina at home, she will likely benefit from further titration of oral nitrate therapy.  Discharge Vitals Blood pressure 119/79, pulse 58, temperature 97.7 F (36.5 C), temperature source Oral, resp. rate 18, height 5\' 3"  (1.6 m), weight 211 lb 1.6 oz (95.754 kg), SpO2 100 %.  Filed Weights   02/06/15 1105 02/06/15 1600 02/07/15 0516  Weight: 212 lb 11.9 oz (96.5 kg) 211 lb 11.2 oz (96.026 kg) 211 lb 1.6 oz (95.754 kg)    Labs  CBC  Recent Labs  02/06/15 1232 02/07/15 0348  WBC 7.1 5.9  NEUTROABS 4.7  --   HGB 12.2 11.7*  HCT 35.8* 33.9*  MCV 91.3 92.4  PLT 279 119   Basic Metabolic Panel  Recent Labs  02/06/15 1232 02/07/15 0348  NA 137 137  K 4.1 4.0  CL 100* 104  CO2 28 27  GLUCOSE 92 100*  BUN 17 13  CREATININE 1.17* 1.10*  CALCIUM 9.9 9.0   Liver Function Tests  Recent Labs  02/07/15 0348  AST 14*  ALT 12*  ALKPHOS 62  BILITOT 0.4  PROT 6.4*  ALBUMIN 2.9*   Cardiac Enzymes  Recent Labs  02/06/15 1232 02/06/15 1853 02/06/15 2355  TROPONINI <0.03 <0.03 <0.03   Hemoglobin A1C  Recent Labs  02/06/15 1232  HGBA1C 5.3   Fasting Lipid Panel  Recent Labs  02/07/15 0348  CHOL 265*  HDL 60  LDLCALC 167*  TRIG 192*  CHOLHDL 4.4   Thyroid Function Tests  Recent Labs  02/06/15 1232  TSH 2.834    Disposition  Pt is being discharged  home today in good condition.  Follow-up Plans & Appointments      Follow-up Information    Follow up with Truitt Merle, NP On 02/21/2015.   Specialties:  Nurse Practitioner, Interventional Cardiology, Cardiology, Radiology   Why:  11:30 AM - Dr. Kennon Holter Nurse Practitioner [facility] office).   Contact information:   [place] [place] [address] [phone]       Discharge Medications    Medication List    STOP taking these medications        hydrochlorothiazide 25 MG tablet  Commonly known as:  HYDRODIURIL      TAKE these medications  ALPRAZolam 0.5 MG tablet  Commonly known as:  XANAX  Take 0.5 mg by mouth daily.     amLODipine 5 MG tablet  Commonly known as:  NORVASC  Take 1 tablet (5 mg total) by mouth daily.     aspirin 81 MG EC tablet  Take 1 tablet (81 mg total) by mouth daily.     atorvastatin 40 MG tablet  Commonly known as:  LIPITOR  Take 1 tablet (40 mg total) by mouth daily at 6 PM.     clopidogrel 75 MG tablet  Commonly known as:  PLAVIX  TAKE 1 TABLET BY MOUTH ONCE A DAY WITH BREAKFAST     estradiol 0.5 MG tablet  Commonly known as:  ESTRACE  Take 1 mg by mouth daily.     isosorbide mononitrate 60 MG 24 hr tablet  Commonly known as:  IMDUR  Take 1 tablet (60 mg total) by mouth daily.     lisinopril 5 MG tablet  Commonly known as:  PRINIVIL,ZESTRIL  Take 1 tablet (5 mg total) by mouth daily.     MAGNESIUM PO  Take 1 tablet by mouth daily.     metoprolol tartrate 25 MG tablet  Commonly known as:  LOPRESSOR  TAKE 1 TABLET BY MOUTH 2 TIMES DAILY.     nitroGLYCERIN 0.4 MG SL tablet  Commonly known as:  NITROSTAT  Place 1 tablet (0.4 mg total) under the tongue every 5 (five) minutes as needed for chest pain.     pantoprazole 40 MG tablet  Commonly known as:  PROTONIX  TAKE 1 TABLET BY MOUTH TWICE A DAY     RANEXA 1000 MG SR tablet  Generic drug:  ranolazine  TAKE 1 TABLET BY MOUTH 2 TIMES DAILY.        Outstanding Labs/Studies  None  Duration of Discharge Encounter   Greater than 30 minutes including physician time.  Signed, Murray Hodgkins NP 02/07/2015, 1:37 PM

## 2015-02-07 NOTE — Progress Notes (Signed)
Subjective: Feeling well. No chest pain or dyspnea overnight. She slept flat without difficulty.   Objective: Vital signs in last 24 hours: Temp:  [97.5 F (36.4 C)-97.7 F (36.5 C)] 97.7 F (36.5 C) (07/08 0516) Pulse Rate:  [0-246] 58 (07/08 0516) Resp:  [0-21] 18 (07/08 0516) BP: (95-146)/(56-86) 113/78 mmHg (07/08 0516) SpO2:  [0 %-100 %] 100 % (07/08 0516) Weight:  [95.754 kg (211 lb 1.6 oz)-96.5 kg (212 lb 11.9 oz)] 95.754 kg (211 lb 1.6 oz) (07/08 0516) Last BM Date: 02/06/15  Intake/Output from previous day: 07/07 0701 - 07/08 0700 In: 296 [I.V.:296] Out: -   Medications Current Facility-Administered Medications  Medication Dose Route Frequency Provider Last Rate Last Dose  . 0.9 %  sodium chloride infusion  250 mL Intravenous PRN Rhonda G Barrett, [name]      . 0.9 %  sodium chloride infusion  250 mL Intravenous PRN Leonie Man, MD      . acetaminophen (TYLENOL) tablet 650 mg  650 mg Oral Q4H PRN Rhonda G Barrett, [name]      . ALPRAZolam (XANAX) tablet 0.25 mg  0.25 mg Oral BID PRN Evelene Croon Barrett, [name]      . ALPRAZolam Duanne Moron) tablet 0.5 mg  0.5 mg Oral QHS PRN Rhonda G Barrett, [name]      . amLODipine (NORVASC) tablet 5 mg  5 mg Oral Daily Rhonda G Barrett, [name]   5 mg at 02/06/15 1145  . aspirin EC tablet 81 mg  81 mg Oral Daily Rhonda G Barrett, [name]   81 mg at 02/06/15 1145  . atorvastatin (LIPITOR) tablet 40 mg  40 mg Oral q1800 Rhonda G Barrett, [name]   40 mg at 02/06/15 1815  . clopidogrel (PLAVIX) tablet 75 mg  75 mg Oral Q breakfast Thayer Headings, MD   75 mg at 02/06/15 1808  . isosorbide mononitrate (IMDUR) 24 hr tablet 60 mg  60 mg Oral Daily Rhonda G Barrett, [name]   60 mg at 02/06/15 1145  . lisinopril (PRINIVIL,ZESTRIL) tablet 5 mg  5 mg Oral Daily Rhonda G Barrett, [name]   5 mg at 02/06/15 1145  . Magnesium TABS 200 mg  1 tablet Oral Daily Rhonda G Barrett, [name]      . metoprolol tartrate (LOPRESSOR) tablet 25 mg  25 mg Oral BID Evelene Croon Barrett, [name]    25 mg at 02/06/15 2210  . morphine 2 MG/ML injection 2 mg  2 mg Intravenous Q1H PRN Leonie Man, MD      . nitroGLYCERIN (NITROSTAT) SL tablet 0.4 mg  0.4 mg Sublingual Q5 Min x 3 PRN Rhonda G Barrett, [name]      . ondansetron (ZOFRAN) injection 4 mg  4 mg Intravenous Q6H PRN Rhonda G Barrett, [name]      . pantoprazole (PROTONIX) EC tablet 40 mg  40 mg Oral BID Rhonda G Barrett, [name]   40 mg at 02/06/15 1815  . ranolazine (RANEXA) 12 hr tablet 1,000 mg  1,000 mg Oral BID Evelene Croon Barrett, [name]   1,000 mg at 02/06/15 1815  . sodium chloride 0.9 % injection 3 mL  3 mL Intravenous Q12H Rhonda G Barrett, [name]   3 mL at 02/06/15 2211  . sodium chloride 0.9 % injection 3 mL  3 mL Intravenous PRN Rhonda G Barrett, [name]      . sodium chloride 0.9 % injection 3 mL  3 mL Intravenous Q12H Leonie Man, MD   3 mL  at 02/06/15 1741  . sodium chloride 0.9 % injection 3 mL  3 mL Intravenous PRN Leonie Man, MD      . zolpidem [facility]) tablet 5 mg  5 mg Oral QHS PRN Lonn Georgia, [name]        PE: General: Pleasant affect, NAD Skin: Warm and dry HEENT: normocephalic, sclera clear, mucus membranes moist Neck: supple, no JVD, no bruits  Heart: RRR, S1 S2 normal. No murmur, gallup, rub or click Lungs: clear without rales, rhonchi, or wheezes Abd: soft, non tender, + BS.  Ext: no lower ext edema, 2+ pedal pulses, 2+ radial pulses Neuro: Grossly normal  Lab Results:   Recent Labs  02/06/15 1232 02/07/15 0348  WBC 7.1 5.9  HGB 12.2 11.7*  HCT 35.8* 33.9*  PLT 279 260   BMET  Recent Labs  02/06/15 1232 02/07/15 0348  NA 137 137  K 4.1 4.0  CL 100* 104  CO2 28 27  GLUCOSE 92 100*  BUN 17 13  CREATININE 1.17* 1.10*  CALCIUM 9.9 9.0   PT/INR  Recent Labs  02/06/15 1232  LABPROT 13.0  INR 0.96   Cholesterol  Recent Labs  02/07/15 0348  CHOL 265*   Cardiac Catheterization 02/06/15  1. Stable multivessel disease with diffuse eccentric stenosis in the circumflex-OM 2  , diffuse mild-to-moderate disease in the RCA , as well as subtotal/total occlusion of the entire mid to distal LAD -- with faint collaterals from left the left and right to left.  2. The left ventricular systolic function is essentially normal with normal LVEDP 3. Mid LAD to Dist LAD lesion, 100% stenosed - similar in appearance to 11/2012 (but now essentially CTO). Ost 1st Sept lesion, 90% stenosed. 4. Mid RCA lesion, 35% stenosed. Dist RCA lesion, 45% stenosed. RPDA lesion, 40% stenosed. 5. Ost 3rd Mrg to 3rd Mrg lesion, 60% stenosed (stable to improved from 2014). Tiny Ost 2nd Mrg, 90% stenosed.  Assessment/Plan Principal Problem:   Unstable angina Active Problems:   GERD   Essential hypertension   Hypothyroidism   Obesity (BMI 35.0-39.9 without comorbidity)   CAD (coronary artery disease), possible SCAD   Hyperlipidemia   Dissection of coronary artery   1. CAD: - LHC yesterday showing stable multivessel dz. No culprit lesion was identified. The distal circ-OM2 and RCA actually appeared to be improved from her last cath. Stable total/subtotal occlusion of mid to distal LAD. - Medical therapy recommended. - She is on amlodipine 5 mg daily, Imdur 60 mg daily, lisinopril 5 mg daily, Lopressor 25 mg BID, and Ranexa 1000 mg BID.  - She is bradycardic at baseline with HR in the low to mid 50s. Her BP is also low-normal with SBPs averaging 110-115 mmHg.  - Consider increasing Imdur at follow-up if anginal symptoms do not improve.   2. HTN: - Well controlled - Increasing amlodipine as above  3. HLD:  - Continue atorvastatin 40 mg daily.    4. Obesity: - Lifestyle measures including diet and exercise discussed.   [place] for discharge home today.  Will need close follow-up with Dr. Gwenlyn Found in the office or midlevel provider in 5-7 days.  Pixie Casino, MD, [facility] Attending Cardiologist [facility]   LOS: 1 day  02/07/2015 8:10 AM

## 2015-02-07 NOTE — Telephone Encounter (Signed)
New message     TCM appt on  7.22.2016 with Truitt Merle    Per Ignacia Bayley PA

## 2015-02-07 NOTE — Discharge Instructions (Signed)

## 2015-02-10 NOTE — Telephone Encounter (Signed)
Left message for patient to call.

## 2015-02-11 NOTE — Telephone Encounter (Signed)
LM on home phone listed for pt to call back.

## 2015-02-14 ENCOUNTER — Telehealth: Payer: Self-pay | Admitting: Cardiovascular Disease

## 2015-02-14 MED ORDER — RANOLAZINE ER 1000 MG PO TB12: 1000.0000 mg | ORAL_TABLET | Freq: Two times a day (BID) | ORAL | Status: DC

## 2015-02-14 NOTE — Telephone Encounter (Signed)
Refill on Ranexa 1000mg  90 day supply e-scribed to [facility].  Patient notified this was done.

## 2015-02-14 NOTE — Telephone Encounter (Signed)
°  1. Which medications need to be refilled? Ranexa  2. Which pharmacy is medication to be sent to?[facility]   3. Do they need a 30 day or 90 day supply? 90  4. Would they like a call back once the medication has been sent to the pharmacy? Yes

## 2015-02-21 ENCOUNTER — Ambulatory Visit (INDEPENDENT_AMBULATORY_CARE_PROVIDER_SITE_OTHER): Payer: Federal, State, Local not specified - PPO | Admitting: Nurse Practitioner

## 2015-02-21 ENCOUNTER — Encounter: Payer: Self-pay | Admitting: Nurse Practitioner

## 2015-02-21 VITALS — BP 120/82 | HR 74 | Ht 63.0 in | Wt 212.8 lb

## 2015-02-21 DIAGNOSIS — I259 Chronic ischemic heart disease, unspecified: Secondary | ICD-10-CM

## 2015-02-21 DIAGNOSIS — Z9889 Other specified postprocedural states: Secondary | ICD-10-CM | POA: Diagnosis not present

## 2015-02-21 DIAGNOSIS — I10 Essential (primary) hypertension: Secondary | ICD-10-CM

## 2015-02-21 NOTE — Patient Instructions (Addendum)
We will be checking the following labs today - NONE   Medication Instructions:    Continue with your current medicines.     Testing/Procedures To Be Arranged:  N/A  Follow-Up:   See Dr. Gwenlyn Found in 3 months with fasting labs    Other Special Instructions:   N/A  Call the [place] office at [phone] if you have any questions, problems or concerns.

## 2015-02-21 NOTE — Progress Notes (Signed)
CARDIOLOGY OFFICE NOTE  Date:  02/21/2015    [name] Date of Birth: 1955/10/27 Medical Record #[id number]  PCP:  No PCP Per Patient  Cardiologist:  Gwenlyn Found   Chief Complaint  Patient presents with  . Post hospital visit for chest pain/CAD    Seen for Dr. Gwenlyn Found    History of Present Illness: [name] is a 59 y.o. female who presents today for a post cath visit. This was to be a TOC visit - however no phone call documented.  Seen for Dr. Gwenlyn Found. She has a h/o CAD and possible spontaneous coronary artery dissection of the LAD dating back to 11/2009. She has been medically managed over the years and has had some degree of chronic angina requiring titration of nitrate and addition of ranexa therapies. She underwent relook cath in 11/2012 secondary to worsening chest pain and was noted to have progression of her LAD disease along with branch vessel disease. She was started on plavix and meds were titrated.  She's done reasonably well but earlier this month she presented to the office for evaluation related to recurrent chest pain. Decision was made to admit her for further evaluation and catheterization.  Patient ruled out for MI. She underwent diagnostic catheterization on 7/7 revealing stable, severe LAD disease with severe septal perforator and OM2 disease, and moderate nonobstructive diagonal and RCA disease. Overall there was no significant change in anatomy from prior catheterization and there were no clear targets for intervention. Continued medical therapy was recommended and she was maintained on bb, ccb, nitrate, and ranexa therapy.   Comes back today. Here alone. Says she is doing well. No more chest pain. Not short of breath. Admits that she has "gotten lazy" with exercise - mostly because it has been hot. BP is good. She has been taking her medicines. No problems with her cath site. She is happy with how she is doing.   Past Medical History    Diagnosis Date  . Esophageal stricture   . Duodenitis   . Gastritis   . Tubular adenoma polyp of rectum 2008  . Hypertension     2D ECHO, 09/30/2008 - EF >55%, normal: RENAL DOPPLER, 03/21/2007 - normal duplex study  . GERD (gastroesophageal reflux disease)   . IBS (irritable bowel syndrome)   . Ischemic colitis   . H/O hiatal hernia   . Lower GI bleeding     10/07/11 "I've had 3 episodes in the past year"  . History of esophageal stricture   . Colon polyps   . Myocardial infarction 12/10/2012    "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)  . CAD (coronary artery disease), possible SCAD 12/14/2012  . Dyspnea on exertion     LEXISCAN, 09/30/2008 - normal, EKG negative for ischemia, no ECG changes  . Hypothyroidism   . Gout     "only from RX given after MI" (01/22/2013)  . Anxiety   . Angina effort 05/22/2013    Past Surgical History  Procedure Laterality Date  . Abdominal hysterectomy  1999  . Back surgery  09/2008  . Hammer toe surgery Bilateral ~ 2002  . Tonsillectomy and adenoidectomy  1960's  . Esophageal dilation  08/2010  . Peripheral venous study  10/03/2008    No evidence of DVT, superficial thrombosis, or Baker's cyst  . Anterior lumbar fusion  2010    "L4-5" (01/22/2013)  . Tubal ligation  1985  . Esophageal dilation      "once or twice" (  01/22/2013)  . Left heart catheterization with coronary angiogram N/A 12/11/2012    Procedure: LEFT HEART CATHETERIZATION WITH CORONARY ANGIOGRAM;  Surgeon: Leonie Man, MD;  Location: [facility] CATH LAB;  Service: Cardiovascular;  Laterality: N/A;  . Left heart catheterization with coronary angiogram N/A 12/15/2012    Procedure: LEFT HEART CATHETERIZATION WITH CORONARY ANGIOGRAM;  Surgeon: Leonie Man, MD;  Location: [facility] CATH LAB;  Service: Cardiovascular;  Laterality: N/A;  . Cardiac catheterization N/A 02/06/2015    Procedure: Left Heart Cath and Coronary Angiography;  Surgeon: Leonie Man, MD;  Location: [facility] CV LAB;   Service: Cardiovascular;  Laterality: N/A;     Medications: Current Outpatient Prescriptions  Medication Sig Dispense Refill  . ALPRAZolam (XANAX) 0.5 MG tablet Take 0.5 mg by mouth daily.     Marland Kitchen amLODipine (NORVASC) 5 MG tablet Take 1 tablet (5 mg total) by mouth daily. 30 tablet 6  . aspirin EC 81 MG EC tablet Take 1 tablet (81 mg total) by mouth daily.    Marland Kitchen atorvastatin (LIPITOR) 40 MG tablet Take 1 tablet (40 mg total) by mouth daily at 6 PM. 30 tablet 9  . clopidogrel (PLAVIX) 75 MG tablet TAKE 1 TABLET BY MOUTH ONCE A DAY WITH BREAKFAST 30 tablet 6  . estradiol (ESTRACE) 0.5 MG tablet Take 1 mg by mouth daily.     . isosorbide mononitrate (IMDUR) 60 MG 24 hr tablet Take 1 tablet (60 mg total) by mouth daily. 90 tablet 3  . lisinopril (PRINIVIL,ZESTRIL) 5 MG tablet Take 1 tablet (5 mg total) by mouth daily. 90 tablet 3  . MAGNESIUM PO Take 1 tablet by mouth daily.    . metoprolol tartrate (LOPRESSOR) 25 MG tablet TAKE 1 TABLET BY MOUTH 2 TIMES DAILY. 60 tablet 11  . nitroGLYCERIN (NITROSTAT) 0.4 MG SL tablet Place 1 tablet (0.4 mg total) under the tongue every 5 (five) minutes as needed for chest pain. 25 tablet 5  . pantoprazole (PROTONIX) 40 MG tablet TAKE 1 TABLET BY MOUTH TWICE A DAY 60 tablet 6  . ranolazine (RANEXA) 1000 MG SR tablet Take 1 tablet (1,000 mg total) by mouth 2 (two) times daily. 180 tablet 1  . [DISCONTINUED] phentermine 15 MG capsule Take 15 mg by mouth every morning.       No current facility-administered medications for this visit.    Allergies: No Known Allergies  Social History: The patient  reports that she has never smoked. She has never used smokeless tobacco. She reports that she does not drink alcohol or use illicit drugs.   Family History: The patient's family history includes Colon cancer in her brother; Heart attack in her brother; Kidney disease in her mother; Stroke in her father.   Review of Systems: Please see the history of present  illness.   Otherwise, the review of systems is positive for none.   All other systems are reviewed and negative.   Physical Exam: VS:  BP 120/82 mmHg  Pulse 74  Ht 5\' 3"  (1.6 m)  Wt 212 lb 12.8 oz (96.525 kg)  BMI 37.71 kg/m2  SpO2 95% .  BMI Body mass index is 37.71 kg/(m^2).  Wt Readings from Last 3 Encounters:  02/21/15 212 lb 12.8 oz (96.525 kg)  02/07/15 211 lb 1.6 oz (95.754 kg)  02/06/15 212 lb (96.163 kg)    General: Pleasant. Well developed, well nourished and in no acute distress. She is obese.  HEENT: Normal. Neck: Supple, no JVD, carotid bruits, or  masses noted.  Cardiac: Regular rate and rhythm. No murmurs, rubs, or gallops. No edema.  Respiratory:  Lungs are clear to auscultation bilaterally with normal work of breathing.  GI: Soft and nontender.  MS: No deformity or atrophy. Gait and ROM intact. Skin: Warm and dry. Color is normal.  Neuro:  Strength and sensation are intact and no gross focal deficits noted.  Psych: Alert, appropriate and with normal affect.   LABORATORY DATA:  EKG:  EKG is not ordered today.   Lab Results  Component Value Date   WBC 5.9 02/07/2015   HGB 11.7* 02/07/2015   HCT 33.9* 02/07/2015   PLT 260 02/07/2015   GLUCOSE 100* 02/07/2015   CHOL 265* 02/07/2015   TRIG 192* 02/07/2015   HDL 60 02/07/2015   LDLCALC 167* 02/07/2015   ALT 12* 02/07/2015   AST 14* 02/07/2015   NA 137 02/07/2015   K 4.0 02/07/2015   CL 104 02/07/2015   CREATININE 1.10* 02/07/2015   BUN 13 02/07/2015   CO2 27 02/07/2015   TSH 2.834 02/06/2015   INR 0.96 02/06/2015   HGBA1C 5.3 02/06/2015    BNP (last 3 results) No results for input(s): BNP in the last 8760 hours.  ProBNP (last 3 results)  Recent Labs  04/15/14 0853  PROBNP 168.70*     Other Studies Reviewed Today: Cardiac Catheterization 02/06/2015  Coronary Findings    Dominance: Right   Left Main  The vessel was injected is large . TIG 4.0 Catheter used.     Left  Anterior Descending  The vessel is normal in caliber . There is severe diffuse disease throughout the vessel. from just after D1 takeoff, the vessel appears to be small & tapers to occlusion in the mid vessel The vessel was previously described as appearing like a spontaneous coronary dissection. It looks almost identical to 2014.   . Mid LAD to Dist LAD lesion, 100% stenosed. diffuse chronic total occlusion and has bridging right-to-left left-to-left collateral flow.   . First Diagonal Branch   The vessel is moderate in size.   . First Septal Branch   The vessel is moderate in size.   Colon Flattery 1st Sept lesion, 90% stenosed. discrete located at the major branch .     Ramus Intermedius  The vessel is small .     Left Circumflex  The vessel is large .   Marland Kitchen First Obtuse Marginal Branch   The vessel is moderate in size.   Marland Kitchen Second Obtuse Marginal Branch   The vessel is small in size.   Colon Flattery 2nd Mrg to 2nd Mrg lesion, 90% stenosed. discrete located at the major branch . Too Small to consider PCI   . Third Obtuse Marginal Branch   The vessel is large in size.   Colon Flattery 3rd Mrg to 3rd Mrg lesion, 60% stenosed. diffuse eccentric . Stable to improved from 11/2012 (was called ~70% at that time)   . Lateral Third Obtuse Marginal Branch   The vessel is small in size.     Right Coronary Artery  The vessel was injected is normal in caliber . TIG 4.0 Catheter used. The vessel exhibits minimal luminal irregularities.   . Mid RCA lesion, 35% stenosed. tubular .   Marland Kitchen Dist RCA lesion, 45% stenosed. discrete located at the bend .   Marland Kitchen Acute Marginal Branch   The vessel is small in size.   . Right Posterior Descending Artery   The vessel is normal in  size and exhibits minimal luminal irregularities. Essentially identical to 2014   . RPDA lesion, 40% stenosed. discrete .   Marland Kitchen Right Posterior Atrioventricular Branch   The vessel is  small in size.    Left Ventricle The left ventricular size is normal. The left ventricular systolic function is normal. There are wall motion abnormalities in the left ventricle. mild apical hypokinesis There are segmental wall motion abnormalities in the left ventricle.  Mitral Valve There is no mitral valve stenosis and no mitral valve regurgitation.  Aortic Valve There is no aortic valve stenosis, and no aortic valve regurgitation. _____________       Assessment/Plan: 1. Chest pain - known CAD with recent worsening spells of chest pain - s/p cath - to continue with medical management. She is doing well clinically. Would continue with her current regimen. See back in 3 months with fasting labs.   2. HTN - BP good on current regimen.  3. HLD - on statin therapy - lipids previously at goal - she has been compliant with her medicines. Will recheck on return OV and increase if needed.   4. Obesity - encouraged her to get back to exercising - goal of 45 minutes per day.   Current medicines are reviewed with the patient today.  The patient does not have concerns regarding medicines other than what has been noted above.  The following changes have been made:  See above.  Labs/ tests ordered today include:   No orders of the defined types were placed in this encounter.     Disposition:   FU with Dr. Gwenlyn Found in 3 months with fasting labs.    Patient is agreeable to this plan and will call if any problems develop in the interim.   Signed: Burtis Junes, RN, ANP-C 02/21/2015 12:17 PM  [facility] [address] [place], [place]  [address] Phone: [phone] Fax: [phone]

## 2015-02-24 ENCOUNTER — Telehealth: Payer: Self-pay | Admitting: Cardiovascular Disease

## 2015-02-24 MED ORDER — LISINOPRIL 5 MG PO TABS: 5.0000 mg | ORAL_TABLET | Freq: Every day | ORAL | Status: DC

## 2015-02-24 NOTE — Telephone Encounter (Signed)
Please call,concerning her Lisinopril.

## 2015-02-24 NOTE — Telephone Encounter (Signed)
Refill submitted to patient's preferred pharmacy. Informed patient. Pt voiced understanding, no other stated concerns at this time.  

## 2015-04-24 ENCOUNTER — Other Ambulatory Visit: Payer: Self-pay | Admitting: Cardiovascular Disease

## 2015-04-24 NOTE — Telephone Encounter (Signed)
Rx request sent to pharmacy.  

## 2015-05-20 ENCOUNTER — Ambulatory Visit (INDEPENDENT_AMBULATORY_CARE_PROVIDER_SITE_OTHER): Payer: Federal, State, Local not specified - PPO | Admitting: Cardiovascular Disease

## 2015-05-20 ENCOUNTER — Encounter: Payer: Self-pay | Admitting: Cardiovascular Disease

## 2015-05-20 VITALS — BP 138/94 | HR 84 | Ht 63.0 in | Wt 209.3 lb

## 2015-05-20 DIAGNOSIS — I2583 Coronary atherosclerosis due to lipid rich plaque: Secondary | ICD-10-CM

## 2015-05-20 DIAGNOSIS — E785 Hyperlipidemia, unspecified: Secondary | ICD-10-CM

## 2015-05-20 DIAGNOSIS — I10 Essential (primary) hypertension: Secondary | ICD-10-CM

## 2015-05-20 DIAGNOSIS — I2510 Atherosclerotic heart disease of native coronary artery without angina pectoris: Secondary | ICD-10-CM

## 2015-05-20 LAB — LIPID PANEL
CHOLESTEROL: 298 mg/dL — AB (ref 125–200)
HDL: 61 mg/dL (ref >=46)
LDL Cholesterol: 204 mg/dL — ABNORMAL HIGH (ref <130)
Total CHOL/HDL Ratio: 4.9 Ratio (ref <=5.0)
Triglycerides: 166 mg/dL — ABNORMAL HIGH (ref <150)
VLDL: 33 mg/dL — ABNORMAL HIGH (ref <30)

## 2015-05-20 LAB — HEPATIC FUNCTION PANEL
ALT: 14 U/L (ref 6–29)
AST: 14 U/L (ref 10–35)
Albumin: 3.9 g/dL (ref 3.6–5.1)
Alkaline Phosphatase: 81 U/L (ref 33–130)
BILIRUBIN INDIRECT: 0.5 mg/dL (ref 0.2–1.2)
Bilirubin, Direct: 0.1 mg/dL (ref <=0.2)
TOTAL PROTEIN: 6.8 g/dL (ref 6.1–8.1)
Total Bilirubin: 0.6 mg/dL (ref 0.2–1.2)

## 2015-05-20 NOTE — Progress Notes (Signed)
05/20/2015 [name]   1955/10/12  [id number]  Primary Physician No PCP Per Patient Primary Cardiologist: Lorretta Harp MD Renae Gloss   HPI:  The patient is a 84 y/.o. female, who I last saw December of last year with a history of HTN, obesity, and hypothyroidism . I last saw her in the office one year ago. She presented on 12/11/12 after a prolonged episode of angina. Her EKG was without acute changes, but she ruled in for NSTEMI. Her troponin peaked at 4.95. She was placed on IV Heparin and underwent diagnostic coronary angiography. Her initial cardiac catheterization on 5/11, performed by Dr. Ellyn Hack, demonstrated diffuse LAD disease with essentially subtotal occlusion that had the appearance of possible Spontaneous Coronary Artery Dissection. However, with existing CAD, simply diffuse disease was also possible. It was decided to not do any intervention at that time, but to wait and have the patient return, several days later, for a re-look cath to re-evaluate the LAD. She was maintained on medical therapy and had improvement in symtpoms. She returned to the cath lab on 5/16 for re-look. This was also performed by Dr. Ellyn Hack. She was noted to have progression of LAD disease to total occlusion at the original ~95% subtotal location with improved D1-distal LAD and R-L collaterals (septal and distal RPDA). The images were reviewd by Dr. Gwenlyn Found and Dr. Lia Foyer and it was concluded that the best course of action was not to proceed with extensive LAD PTCA-PCI, in the absence of on-going symptoms. Medical therapy was recommended. Since I saw her a year ago she's been doing well until this past July when she was admitted with unstable angina. She ruled out for myocardial infarction. She underwent cardiac catheterization by Dr. Ellyn Hack revealing an occluded LAD in the midportion and otherwise minimal CAD. She did have a 90% small ostial stent second marginal branch stenosis and 60%  PLA stenosis with what appeared to be fairly well preserved LV function. She had an apical wall motion abnormality.  Current Outpatient Prescriptions  Medication Sig Dispense Refill  . ALPRAZolam (XANAX) 0.5 MG tablet Take 0.5 mg by mouth daily.     Marland Kitchen amLODipine (NORVASC) 5 MG tablet Take 1 tablet (5 mg total) by mouth daily. 30 tablet 6  . aspirin EC 81 MG EC tablet Take 1 tablet (81 mg total) by mouth daily.    Marland Kitchen atorvastatin (LIPITOR) 40 MG tablet Take 1 tablet (40 mg total) by mouth daily at 6 PM. 30 tablet 9  . clopidogrel (PLAVIX) 75 MG tablet TAKE 1 TABLET BY MOUTH ONCE A DAY WITH BREAKFAST 30 tablet 0  . estradiol (ESTRACE) 0.5 MG tablet Take 1 mg by mouth daily.     . isosorbide mononitrate (IMDUR) 60 MG 24 hr tablet Take 1 tablet (60 mg total) by mouth daily. 90 tablet 3  . lisinopril (PRINIVIL,ZESTRIL) 5 MG tablet Take 1 tablet (5 mg total) by mouth daily. 90 tablet 3  . MAGNESIUM PO Take 1 tablet by mouth daily.    . metoprolol tartrate (LOPRESSOR) 25 MG tablet TAKE 1 TABLET BY MOUTH 2 TIMES DAILY. 60 tablet 11  . nitroGLYCERIN (NITROSTAT) 0.4 MG SL tablet Place 1 tablet (0.4 mg total) under the tongue every 5 (five) minutes as needed for chest pain. 25 tablet 5  . pantoprazole (PROTONIX) 40 MG tablet TAKE 1 TABLET BY MOUTH TWICE A DAY 60 tablet 6  . ranolazine (RANEXA) 1000 MG SR tablet Take 1 tablet (1,000 mg total)  by mouth 2 (two) times daily. 180 tablet 1  . [DISCONTINUED] phentermine 15 MG capsule Take 15 mg by mouth every morning.       No current facility-administered medications for this visit.    No Known Allergies  Social History   Social History  . Marital Status: Single    Spouse Name: N/A  . Number of Children: 2  . Years of Education: N/A   Occupational History  . [employer] Editor, commissioning   Social History Main Topics  . Smoking status: Never Smoker   . Smokeless tobacco: Never Used  . Alcohol Use: No  . Drug Use: No  . Sexual Activity:  No   Other Topics Concern  . Not on file   Social History Narrative   Daily caffeine use.     Review of Systems: General: negative for chills, fever, night sweats or weight changes.  Cardiovascular: negative for chest pain, dyspnea on exertion, edema, orthopnea, palpitations, paroxysmal nocturnal dyspnea or shortness of breath Dermatological: negative for rash Respiratory: negative for cough or wheezing Urologic: negative for hematuria Abdominal: negative for nausea, vomiting, diarrhea, bright red blood per rectum, melena, or hematemesis Neurologic: negative for visual changes, syncope, or dizziness All other systems reviewed and are otherwise negative except as noted above.    Blood pressure 138/94, pulse 84, height 5\' 3"  (1.6 m), weight 209 lb 4.8 oz (94.938 kg).  General appearance: alert and no distress Neck: no adenopathy, no carotid bruit, no JVD, supple, symmetrical, trachea midline and thyroid not enlarged, symmetric, no tenderness/mass/nodules Lungs: clear to auscultation bilaterally Heart: regular rate and rhythm, S1, S2 normal, no murmur, click, rub or gallop Extremities: extremities normal, atraumatic, no cyanosis or edema  EKG not performed today  ASSESSMENT AND PLAN:   CAD (coronary artery disease), possible SCAD History of coronary artery disease status post non-STEMI 12/11/12 when she had recurrent catheterization revealing a subtotally occluded LAD thought to be related to SCAD. Subsequent Myoview showed scar in the LAD territory. Her enzymes were reviewed by myself and Dr. Hyman Hopes and felt that intervention was not warranted since she was currently stable. She was recently admitted with unstable angina 757/16. She ruled out for myocardial infarction. She underwent cardiac catheterization by Dr. Ellyn Hack revealing anatomy fairly similar to her prior cath. Since that time she remained clinically stable on antianginal medications.  Hyperlipidemia History of  hyperlipidemia with recent lipid profile performed 02/07/15 revealing total cholesterol 265 up from 184 a year ago. Her LDL is 167 and her HDL was 60. She is on atorvastatin 40 mg a day. We will recheck a lipid and liver profile this morning  Essential hypertension History of hypertension blood pressure measured today at 138/94. She is on amlodipine, and metoprolol. Continue current meds at current dosing      Lorretta Harp MD [facility], [facility] 05/20/2015 8:41 AM

## 2015-05-20 NOTE — Assessment & Plan Note (Signed)
History of hyperlipidemia with recent lipid profile performed 02/07/15 revealing total cholesterol 265 up from 184 a year ago. Her LDL is 167 and her HDL was 60. She is on atorvastatin 40 mg a day. We will recheck a lipid and liver profile this morning

## 2015-05-20 NOTE — Assessment & Plan Note (Signed)
History of hypertension blood pressure measured today at 138/94. She is on amlodipine, and metoprolol. Continue current meds at current dosing

## 2015-05-20 NOTE — Patient Instructions (Signed)
Medication Instructions:  none  Labwork: Your physician recommends that you return for lab work in: TODAY (LIPID/LIVER) The lab can be found on the FIRST FLOOR of out building in [address]   Testing/Procedures: none  Follow-Up: Your physician wants you to follow-up in: [place]. You will receive a reminder letter in the mail two months in advance. If you don't receive a letter, please call our office to schedule the follow-up appointment.   Any Other Special Instructions Will Be Listed Below (If Applicable).

## 2015-05-20 NOTE — Assessment & Plan Note (Signed)
History of coronary artery disease status post non-STEMI 12/11/12 when she had recurrent catheterization revealing a subtotally occluded LAD thought to be related to SCAD. Subsequent Myoview showed scar in the LAD territory. Her enzymes were reviewed by myself and Dr. Hyman Hopes and felt that intervention was not warranted since she was currently stable. She was recently admitted with unstable angina 757/16. She ruled out for myocardial infarction. She underwent cardiac catheterization by Dr. Ellyn Hack revealing anatomy fairly similar to her prior cath. Since that time she remained clinically stable on antianginal medications.

## 2015-05-23 ENCOUNTER — Other Ambulatory Visit: Payer: Self-pay | Admitting: Cardiovascular Disease

## 2015-05-23 ENCOUNTER — Telehealth: Payer: Self-pay

## 2015-05-23 DIAGNOSIS — E785 Hyperlipidemia, unspecified: Secondary | ICD-10-CM

## 2015-05-23 MED ORDER — PRAVASTATIN SODIUM 40 MG PO TABS: 40.0000 mg | ORAL_TABLET | Freq: Every evening | ORAL | Status: DC

## 2015-05-23 NOTE — Telephone Encounter (Signed)
-----   Message from Lorretta Harp, MD sent at 05/21/2015  9:10 AM EDT ----- FLP HORRIBLE. Please make sure she starts the Pravastatin and have a re check in 2 months. Please refer her to [facility] for PCSK9

## 2015-05-23 NOTE — Telephone Encounter (Signed)
Pt made aware of results no questions at this time.  Pt has not been taking Lipitor but will start taking the Pravastatin 40. Order sent to pt pharmacy and repeat lab work set for jan 2017.

## 2015-05-26 ENCOUNTER — Other Ambulatory Visit: Payer: Self-pay | Admitting: Cardiovascular Disease

## 2015-06-09 ENCOUNTER — Other Ambulatory Visit: Payer: Self-pay | Admitting: Cardiovascular Disease

## 2015-06-26 ENCOUNTER — Emergency Department (HOSPITAL_BASED_OUTPATIENT_CLINIC_OR_DEPARTMENT_OTHER)
Admission: EM | Admit: 2015-06-26 | Discharge: 2015-06-26 | Disposition: A | Discharge status: Home / Self Care | Payer: Federal, State, Local not specified - PPO | Attending: Emergency Medicine | Admitting: Emergency Medicine

## 2015-06-26 ENCOUNTER — Encounter (HOSPITAL_BASED_OUTPATIENT_CLINIC_OR_DEPARTMENT_OTHER): Payer: Self-pay | Admitting: *Deleted

## 2015-06-26 DIAGNOSIS — W268XXA Contact with other sharp object(s), not elsewhere classified, initial encounter: Secondary | ICD-10-CM | POA: Diagnosis not present

## 2015-06-26 DIAGNOSIS — Y9289 Other specified places as the place of occurrence of the external cause: Secondary | ICD-10-CM | POA: Insufficient documentation

## 2015-06-26 DIAGNOSIS — Z86010 Personal history of colonic polyps: Secondary | ICD-10-CM | POA: Diagnosis not present

## 2015-06-26 DIAGNOSIS — I252 Old myocardial infarction: Secondary | ICD-10-CM | POA: Diagnosis not present

## 2015-06-26 DIAGNOSIS — S61212A Laceration without foreign body of right middle finger without damage to nail, initial encounter: Secondary | ICD-10-CM | POA: Insufficient documentation

## 2015-06-26 DIAGNOSIS — Z79899 Other long term (current) drug therapy: Secondary | ICD-10-CM | POA: Diagnosis not present

## 2015-06-26 DIAGNOSIS — Z9889 Other specified postprocedural states: Secondary | ICD-10-CM | POA: Diagnosis not present

## 2015-06-26 DIAGNOSIS — Z7902 Long term (current) use of antithrombotics/antiplatelets: Secondary | ICD-10-CM | POA: Diagnosis not present

## 2015-06-26 DIAGNOSIS — Z8639 Personal history of other endocrine, nutritional and metabolic disease: Secondary | ICD-10-CM | POA: Diagnosis not present

## 2015-06-26 DIAGNOSIS — I10 Essential (primary) hypertension: Secondary | ICD-10-CM | POA: Insufficient documentation

## 2015-06-26 DIAGNOSIS — Z7982 Long term (current) use of aspirin: Secondary | ICD-10-CM | POA: Insufficient documentation

## 2015-06-26 DIAGNOSIS — K219 Gastro-esophageal reflux disease without esophagitis: Secondary | ICD-10-CM | POA: Insufficient documentation

## 2015-06-26 DIAGNOSIS — Z23 Encounter for immunization: Secondary | ICD-10-CM | POA: Diagnosis not present

## 2015-06-26 DIAGNOSIS — F419 Anxiety disorder, unspecified: Secondary | ICD-10-CM | POA: Insufficient documentation

## 2015-06-26 DIAGNOSIS — M791 Myalgia: Secondary | ICD-10-CM | POA: Insufficient documentation

## 2015-06-26 DIAGNOSIS — Z793 Long term (current) use of hormonal contraceptives: Secondary | ICD-10-CM | POA: Insufficient documentation

## 2015-06-26 DIAGNOSIS — I2510 Atherosclerotic heart disease of native coronary artery without angina pectoris: Secondary | ICD-10-CM | POA: Insufficient documentation

## 2015-06-26 DIAGNOSIS — Y998 Other external cause status: Secondary | ICD-10-CM | POA: Diagnosis not present

## 2015-06-26 DIAGNOSIS — Y9389 Activity, other specified: Secondary | ICD-10-CM | POA: Insufficient documentation

## 2015-06-26 DIAGNOSIS — Z86018 Personal history of other benign neoplasm: Secondary | ICD-10-CM | POA: Diagnosis not present

## 2015-06-26 DIAGNOSIS — S61219A Laceration without foreign body of unspecified finger without damage to nail, initial encounter: Secondary | ICD-10-CM

## 2015-06-26 MED ORDER — TETANUS-DIPHTH-ACELL PERTUSSIS 5-2.5-18.5 LF-MCG/0.5 IM SUSP
0.5000 mL | Freq: Once | INTRAMUSCULAR | Status: AC
  Administered 2015-06-26: 0.5 mL via INTRAMUSCULAR

## 2015-06-26 MED ORDER — LIDOCAINE HCL (PF) 1 % IJ SOLN
INTRAMUSCULAR | Status: AC
  Filled 2015-06-26: qty 5

## 2015-06-26 MED ORDER — TETANUS-DIPHTH-ACELL PERTUSSIS 5-2.5-18.5 LF-MCG/0.5 IM SUSP
INTRAMUSCULAR | Status: AC
  Filled 2015-06-26: qty 0.5

## 2015-06-26 NOTE — Discharge Instructions (Signed)
Please read and follow all provided instructions.  Your diagnoses today include:  1. Finger laceration, initial encounter     Tests performed today include:  Vital signs. See below for your results today.   Medications prescribed:   None  Take any prescribed medications only as directed.   Home care instructions:  Follow any educational materials and wound care instructions contained in this packet.   Follow-up instructions: Suture Removal: Return to the Emergency Department or see your primary care care doctor in 7 days for a recheck of your wound and removal of your sutures or staples.    Return instructions:  Return to the Emergency Department if you have:  Fever  Worsening pain  Worsening swelling of the wound  Pus draining from the wound  Redness of the skin that moves away from the wound, especially if it streaks away from the affected area   Any other emergent concerns  Your vital signs today were: BP 126/81 mmHg   Pulse 60   Temp(Src) 97.9 F (36.6 C) (Oral)   Resp 20   Ht 5\' 3"  (1.6 m)   Wt 90.719 kg   BMI 35.44 kg/m2   SpO2 96% If your blood pressure (BP) was elevated above 135/85 this visit, please have this repeated by your doctor within one month. --------------

## 2015-06-26 NOTE — ED Notes (Signed)
Laceration to her right 3rd digit. She takes blood thinners and is unable to control the bleeding with a Band-Aid.

## 2015-06-26 NOTE — ED Provider Notes (Signed)
CSN: [id number]     Arrival date & time 06/26/15  1313 History   First MD Initiated Contact with Patient 06/26/15 1337     Chief Complaint  Patient presents with  . Laceration     (Consider location/radiation/quality/duration/timing/severity/associated sxs/prior Treatment) HPI Comments: Patient on Plavix due to spontaneous left anterior coronary artery dissection -- presents with right long digit laceration sustained at approximately 10 AM while she was opening a can. She cut herself on metal. She's been holding pressure but bleeding has not stopped. No other injuries or symptoms reported. No numbness or tingling. No weakness. Tetanus is not up-to-date. The onset of this condition was acute. The course is constant. Aggravating factors: none. Alleviating factors: none.    Patient is a 59 y.o. female presenting with skin laceration. The history is provided by the patient.  Laceration   Past Medical History  Diagnosis Date  . Esophageal stricture   . Duodenitis   . Gastritis   . Tubular adenoma polyp of rectum 2008  . Hypertension     2D ECHO, 09/30/2008 - EF >55%, normal: RENAL DOPPLER, 03/21/2007 - normal duplex study  . GERD (gastroesophageal reflux disease)   . IBS (irritable bowel syndrome)   . Ischemic colitis ([place])   . H/O hiatal hernia   . Lower GI bleeding     10/07/11 "I've had 3 episodes in the past year"  . History of esophageal stricture   . Colon polyps   . Myocardial infarction [facility]) 12/10/2012    "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)  . CAD (coronary artery disease), possible SCAD 12/14/2012  . Dyspnea on exertion     LEXISCAN, 09/30/2008 - normal, EKG negative for ischemia, no ECG changes  . Hypothyroidism   . Gout     "only from RX given after MI" (01/22/2013)  . Anxiety   . Angina effort ([place]) 05/22/2013   Past Surgical History  Procedure Laterality Date  . Abdominal hysterectomy  1999  . Back surgery  09/2008  . Hammer toe surgery Bilateral ~  2002  . Tonsillectomy and adenoidectomy  1960's  . Esophageal dilation  08/2010  . Peripheral venous study  10/03/2008    No evidence of DVT, superficial thrombosis, or Baker's cyst  . Anterior lumbar fusion  2010    "L4-5" (01/22/2013)  . Tubal ligation  1985  . Esophageal dilation      "once or twice" (01/22/2013)  . Left heart catheterization with coronary angiogram N/A 12/11/2012    Procedure: LEFT HEART CATHETERIZATION WITH CORONARY ANGIOGRAM;  Surgeon: Leonie Man, MD;  Location: [facility] CATH LAB;  Service: Cardiovascular;  Laterality: N/A;  . Left heart catheterization with coronary angiogram N/A 12/15/2012    Procedure: LEFT HEART CATHETERIZATION WITH CORONARY ANGIOGRAM;  Surgeon: Leonie Man, MD;  Location: [facility] CATH LAB;  Service: Cardiovascular;  Laterality: N/A;  . Cardiac catheterization N/A 02/06/2015    Procedure: Left Heart Cath and Coronary Angiography;  Surgeon: Leonie Man, MD;  Location: [facility] CV LAB;  Service: Cardiovascular;  Laterality: N/A;   Family History  Problem Relation Age of Onset  . Colon cancer Brother   . Kidney disease Mother   . Stroke Father   . Heart attack Brother    Social History  Substance Use Topics  . Smoking status: Never Smoker   . Smokeless tobacco: Never Used  . Alcohol Use: No   OB History    No data available     Review of Systems  Constitutional: Negative for activity change.  Musculoskeletal: Positive for myalgias and arthralgias. Negative for back pain, joint swelling and neck pain.  Skin: Positive for wound.  Neurological: Negative for weakness and numbness.      Allergies  Review of patient's allergies indicates no known allergies.  Home Medications   Prior to Admission medications   Medication Sig Start Date End Date Taking? Authorizing Provider  ALPRAZolam Duanne Moron) 0.5 MG tablet Take 0.5 mg by mouth daily.     Historical Provider, MD  amLODipine (NORVASC) 5 MG tablet Take 1 tablet (5 mg total) by mouth daily.  05/23/15   Lorretta Harp, MD  aspirin EC 81 MG EC tablet Take 1 tablet (81 mg total) by mouth daily. 01/23/13   Brittainy Erie Noe, [name]  atorvastatin (LIPITOR) 40 MG tablet Take 1 tablet (40 mg total) by mouth daily at 6 PM. 01/15/14   Lorretta Harp, MD  clopidogrel (PLAVIX) 75 MG tablet TAKE 1 TABLET BY MOUTH ONCE A DAY WITH BREAKFAST 05/27/15   Lorretta Harp, MD  estradiol (ESTRACE) 0.5 MG tablet Take 1 mg by mouth daily.     Historical Provider, MD  isosorbide mononitrate (IMDUR) 60 MG 24 hr tablet Take 1 tablet (60 mg total) by mouth daily. 11/05/14   Lorretta Harp, MD  lisinopril (PRINIVIL,ZESTRIL) 5 MG tablet Take 1 tablet (5 mg total) by mouth daily. 02/24/15   Lorretta Harp, MD  MAGNESIUM PO Take 1 tablet by mouth daily.    Historical Provider, MD  metoprolol tartrate (LOPRESSOR) 25 MG tablet TAKE 1 TABLET BY MOUTH 2 TIMES DAILY. 11/05/14   Lorretta Harp, MD  nitroGLYCERIN (NITROSTAT) 0.4 MG SL tablet Place 1 tablet (0.4 mg total) under the tongue every 5 (five) minutes as needed for chest pain. 12/16/12   Brittainy Erie Noe, [name]  pantoprazole (PROTONIX) 40 MG tablet TAKE 1 TABLET BY MOUTH TWICE A DAY 06/10/15   Lorretta Harp, MD  pravastatin (PRAVACHOL) 40 MG tablet Take 1 tablet (40 mg total) by mouth every evening. 05/23/15   Lorretta Harp, MD  ranolazine (RANEXA) 1000 MG SR tablet Take 1 tablet (1,000 mg total) by mouth 2 (two) times daily. 02/14/15   Leonie Man, MD   BP 126/81 mmHg  Pulse 60  Temp(Src) 97.9 F (36.6 C) (Oral)  Resp 20  Ht 5\' 3"  (1.6 m)  Wt 90.719 kg  BMI 35.44 kg/m2  SpO2 96% Physical Exam  Constitutional: She appears well-developed and well-nourished.  HENT:  Head: Normocephalic and atraumatic.  Eyes: Pupils are equal, round, and reactive to light.  Neck: Normal range of motion. Neck supple.  Cardiovascular: Exam reveals no decreased pulses.   Musculoskeletal: She exhibits tenderness. She exhibits no edema.       Right wrist:  Normal.       Right hand: She exhibits laceration. She exhibits normal range of motion and no tenderness. Normal sensation noted.  1 cm, mildly gaping, mildly oozing laceration to the finger pad of the right long finger. Laceration does not cross joint line. It is superficial. This wound explored. No foreign body seen or palpated. Do not suspect any tendon laceration or injury. Patient is able to flex the finger at the MCP, DIP, PIP without difficulty. Normal cap refill.  Neurological: She is alert. No sensory deficit.  Motor, sensation, and vascular distal to the injury is fully intact.   Skin: Skin is warm and dry.  Psychiatric: She has a normal mood  and affect.  Nursing note and vitals reviewed.   ED Course  Procedures (including critical care time) Labs Review Labs Reviewed - No data to display  Imaging Review No results found. I have personally reviewed and evaluated these images and lab results as part of my medical decision-making.   EKG Interpretation None      2:15 PM Patient seen and examined. Patient agrees to proceed with laceration repair.   Vital signs reviewed and are as follows: BP 126/81 mmHg  Pulse 60  Temp(Src) 97.9 F (36.6 C) (Oral)  Resp 20  Ht 5\' 3"  (1.6 m)  Wt 90.719 kg  BMI 35.44 kg/m2  SpO2 96%  LACERATION REPAIR Performed by: Faustino Congress Authorized by: Faustino Congress Consent: Verbal consent obtained. Risks and benefits: risks, benefits and alternatives were discussed Consent given by: patient Patient identity confirmed: provided demographic data Prepped and Draped in normal sterile fashion Wound explored  Laceration Location: R long finger  Laceration Length: 1cm  No Foreign Bodies seen or palpated  Anesthesia: local infiltration  Local anesthetic: lidocaine 1% without epinephrine  Anesthetic total: 2 ml  Irrigation method: skin scrub with dermal cleanser, betadine soak Amount of cleaning: standard  Skin closure: 5-0  Ethilon  Number of sutures: 4  Technique: Simple interrupted   Patient tolerance: Patient tolerated the procedure well with no immediate complications.  2:42 PM Patient counseled on wound care. Discussed need for bandaging until the bandage in place overnight to help slow bleeding. Patient counseled on need to return or see PCP/urgent care for suture removal in 7 days. Patient was urged to return to the Emergency Department urgently with worsening pain, swelling, expanding erythema especially if it streaks away from the affected area, fever, or if they have any other concerns. Patient verbalized understanding.   MDM   Final diagnoses:  Finger laceration, initial encounter   Patient with superficial finger laceration, complicated by the fact that she takes Plavix. Bleeding slowed nearly to a stop after suturing performed. I expect a slow ooze. Pressure bandage placed prior to discharge. Do not suspect any foreign bodies or tendon injury. Patient counseled as above. No indications for antibiotic. Tetanus updated.    Carlisle Cater, [name] 06/26/15 1443  Tanna Furry, MD 07/09/15 [phone]

## 2015-07-31 ENCOUNTER — Telehealth: Payer: Self-pay | Admitting: Cardiovascular Disease

## 2015-07-31 NOTE — Telephone Encounter (Signed)
Spoke with pt about night sweats, suggested she talk with her PCP about it.   Contacted pt Pharmacy [facility]) they stated that they do need PA an gave number to call to authorize. [phone] Dx. : GERD (from [facility] 2014 OV notes) Protonix 40 mg BID for generic medications according to plan, will have to pay for name brand PA Approval from 06/01/2015 -07/30/2016 Via members ID used as approval code

## 2015-07-31 NOTE — Telephone Encounter (Signed)
°*  STAT* If patient is at the pharmacy, call can be transferred to refill team.   1. Which medications need to be refilled? (please list name of each medication and dose if known) Pantoprazole 40mg   2. Which pharmacy/location (including street and city if local pharmacy) is medication to be sent to? [place]   3. Do they need a 30 day or 90 day supply? [place]

## 2015-07-31 NOTE — Telephone Encounter (Signed)
Left message to call back  Looks like Rx was sent on 06/10/15 to [facility] for 30 day supply.

## 2015-07-31 NOTE — Telephone Encounter (Signed)
Pt also called in wanting to speak with [name] another issue that has been occuring. She says that she has been having night sweats and she is beginning to concern her. Please f/u with her  Thanks

## 2015-08-21 ENCOUNTER — Other Ambulatory Visit: Payer: Self-pay | Admitting: Cardiology

## 2015-08-21 NOTE — Telephone Encounter (Signed)
Rx request sent to pharmacy.  

## 2015-08-25 ENCOUNTER — Telehealth: Payer: Self-pay | Admitting: Cardiovascular Disease

## 2015-08-25 NOTE — Telephone Encounter (Signed)
Pt made aware that she will need to get that refilled by her PCP. Pt stated she has started seeing a new PCP and information will be updated in chart. Pt verbalized understanding no questions at this time.

## 2015-08-25 NOTE — Telephone Encounter (Signed)
Pt called in requesting that Dr. Gwenlyn Found write her a prescription for Alprazolam 0.5mg . Please f/u with pt  Thanks

## 2015-10-07 ENCOUNTER — Encounter: Payer: Self-pay | Admitting: Internal Medicine

## 2015-11-19 ENCOUNTER — Other Ambulatory Visit: Payer: Self-pay | Admitting: Cardiovascular Disease

## 2015-11-19 NOTE — Telephone Encounter (Signed)
Rx request sent to pharmacy.  

## 2015-12-04 DIAGNOSIS — K080 Exfoliation of teeth due to systemic causes: Secondary | ICD-10-CM | POA: Diagnosis not present

## 2015-12-06 ENCOUNTER — Other Ambulatory Visit: Payer: Self-pay | Admitting: Cardiovascular Disease

## 2015-12-08 NOTE — Telephone Encounter (Signed)
Rx Refill

## 2015-12-11 DIAGNOSIS — Z6841 Body Mass Index (BMI) 40.0 and over, adult: Secondary | ICD-10-CM | POA: Diagnosis not present

## 2015-12-11 DIAGNOSIS — Z1231 Encounter for screening mammogram for malignant neoplasm of breast: Secondary | ICD-10-CM | POA: Diagnosis not present

## 2015-12-11 DIAGNOSIS — Z01419 Encounter for gynecological examination (general) (routine) without abnormal findings: Secondary | ICD-10-CM | POA: Diagnosis not present

## 2016-01-30 DIAGNOSIS — M25512 Pain in left shoulder: Secondary | ICD-10-CM | POA: Diagnosis not present

## 2016-03-08 ENCOUNTER — Other Ambulatory Visit: Payer: Self-pay | Admitting: Cardiovascular Disease

## 2016-05-26 ENCOUNTER — Other Ambulatory Visit: Payer: Self-pay | Admitting: Cardiovascular Disease

## 2016-05-26 DIAGNOSIS — E785 Hyperlipidemia, unspecified: Secondary | ICD-10-CM

## 2016-05-27 ENCOUNTER — Telehealth: Payer: Self-pay

## 2016-05-27 MED ORDER — AMLODIPINE BESYLATE 5 MG PO TABS: 5.0000 mg | ORAL_TABLET | Freq: Every day | ORAL | 1 refills | Status: DC

## 2016-05-27 NOTE — Telephone Encounter (Signed)
Received a call from pharmacist Katy at [facility] calling to report only 3 tablets of Amlodipine were sent in yesterday.Advised ok to change to  # 30.Called pt follow up appointment scheduled with Dr.Berry 06/08/16 at 1:45 pm.

## 2016-06-04 ENCOUNTER — Other Ambulatory Visit: Payer: Self-pay | Admitting: Cardiovascular Disease

## 2016-06-05 ENCOUNTER — Other Ambulatory Visit: Payer: Self-pay | Admitting: Cardiovascular Disease

## 2016-06-08 ENCOUNTER — Ambulatory Visit (INDEPENDENT_AMBULATORY_CARE_PROVIDER_SITE_OTHER): Payer: Federal, State, Local not specified - PPO | Admitting: Cardiovascular Disease

## 2016-06-08 ENCOUNTER — Encounter: Payer: Self-pay | Admitting: Cardiovascular Disease

## 2016-06-08 VITALS — BP 124/82 | HR 63 | Ht 63.0 in | Wt 222.8 lb

## 2016-06-08 DIAGNOSIS — E785 Hyperlipidemia, unspecified: Secondary | ICD-10-CM

## 2016-06-08 DIAGNOSIS — I2510 Atherosclerotic heart disease of native coronary artery without angina pectoris: Secondary | ICD-10-CM

## 2016-06-08 DIAGNOSIS — I10 Essential (primary) hypertension: Secondary | ICD-10-CM | POA: Diagnosis not present

## 2016-06-08 NOTE — Patient Instructions (Signed)

## 2016-06-08 NOTE — Assessment & Plan Note (Signed)
History of hypertension blood pressure measured today at 124/82. She is on amlodipine, lisinopril and Lopressor. Continue current meds at current dosing

## 2016-06-08 NOTE — Progress Notes (Signed)
06/08/2016 [name]   Nov 03, 1955  [id number]  Primary Physician Wonda Cerise, MD Primary Cardiologist: Lorretta Harp MD Lupe Carney, [place]  HPI:  The patient is a 35 y/.o. female, who I last saw December of last year with a history of HTN, obesity, and hypothyroidism . I last saw her in the office 05/20/15.  She presented on 12/11/12 after a prolonged episode of angina. Her EKG was without acute changes, but she ruled in for NSTEMI. Her troponin peaked at 4.95. She was placed on IV Heparin and underwent diagnostic coronary angiography. Her initial cardiac catheterization on 5/11, performed by Dr. Ellyn Hack, demonstrated diffuse LAD disease with essentially subtotal occlusion that had the appearance of possible Spontaneous Coronary Artery Dissection. However, with existing CAD, simply diffuse disease was also possible. It was decided to not do any intervention at that time, but to wait and have the patient return, several days later, for a re-look cath to re-evaluate the LAD. She was maintained on medical therapy and had improvement in symtpoms. She returned to the cath lab on 5/16 for re-look. This was also performed by Dr. Ellyn Hack. She was noted to have progression of LAD disease to total occlusion at the original ~95% subtotal location with improved D1-distal LAD and R-L collaterals (septal and distal RPDA). The images were reviewd by Dr. Gwenlyn Found and Dr. Lia Foyer and it was concluded that the best course of action was not to proceed with extensive LAD PTCA-PCI, in the absence of on-going symptoms. Medical therapy was recommended. Since I saw her a year ago she's been doing well until this past July when she was admitted with unstable angina. She ruled out for myocardial infarction. She underwent cardiac catheterization by Dr. Ellyn Hack revealing an occluded LAD in the midportion and otherwise minimal CAD. She did have a 90% small ostial stent second marginal branch stenosis and 60% PLA  stenosis with what appeared to be fairly well preserved LV function. She had an apical wall motion abnormality. Since I saw her a year ago she saw remained currently stable specifically denying chest pain or shortness of breath. Her major complaint is of chronic fatigue and lack of energy. We have discussed the possibility of obstructive sleep apnea however she does not wish to pursue sleep study or CPAP.   Current Outpatient Prescriptions  Medication Sig Dispense Refill  . ALPRAZolam (XANAX) 0.5 MG tablet Take 0.5 mg by mouth daily.     Marland Kitchen amLODipine (NORVASC) 5 MG tablet Take 1 tablet (5 mg total) by mouth daily. 30 tablet 1  . aspirin EC 81 MG EC tablet Take 1 tablet (81 mg total) by mouth daily.    Marland Kitchen atorvastatin (LIPITOR) 40 MG tablet Take 1 tablet (40 mg total) by mouth daily at 6 PM. 30 tablet 9  . clopidogrel (PLAVIX) 75 MG tablet TAKE 1 TABLET BY MOUTH ONCE A DAY WITH BREAKFAST 30 tablet 0  . estradiol (ESTRACE) 0.5 MG tablet Take 1 mg by mouth daily.     . isosorbide mononitrate (IMDUR) 60 MG 24 hr tablet TAKE 1 TABLET BY MOUTH ONCE A DAY 30 tablet 0  . lisinopril (PRINIVIL,ZESTRIL) 5 MG tablet TAKE 1 TABLET BY MOUTH DAILY. 90 tablet 3  . MAGNESIUM PO Take 1 tablet by mouth daily.    . metoprolol tartrate (LOPRESSOR) 25 MG tablet TAKE 1 TABLET BY MOUTH TWICE A DAY 60 tablet 5  . nitroGLYCERIN (NITROSTAT) 0.4 MG SL tablet Place 1 tablet (0.4 mg  total) under the tongue every 5 (five) minutes as needed for chest pain. 25 tablet 5  . pantoprazole (PROTONIX) 40 MG tablet TAKE 1 TABLET BY MOUTH TWICE A DAY 60 tablet 10  . pravastatin (PRAVACHOL) 40 MG tablet TAKE 1 TABLET BY MOUTH EVERY EVENING. 30 tablet 0  . RANEXA 1000 MG SR tablet TAKE 1 TABLET (1,000 MG TOTAL) BY MOUTH 2 (TWO) TIMES DAILY. 180 tablet 3   No current facility-administered medications for this visit.     No Known Allergies  Social History   Social History  . Marital status: Single    Spouse name: N/A  . Number of  children: 2  . Years of education: N/A   Occupational History  . [employer] Editor, commissioning   Social History Main Topics  . Smoking status: Never Smoker  . Smokeless tobacco: Never Used  . Alcohol use No  . Drug use: No  . Sexual activity: No   Other Topics Concern  . Not on file   Social History Narrative   Daily caffeine use.     Review of Systems: General: negative for chills, fever, night sweats or weight changes.  Cardiovascular: negative for chest pain, dyspnea on exertion, edema, orthopnea, palpitations, paroxysmal nocturnal dyspnea or shortness of breath Dermatological: negative for rash Respiratory: negative for cough or wheezing Urologic: negative for hematuria Abdominal: negative for nausea, vomiting, diarrhea, bright red blood per rectum, melena, or hematemesis Neurologic: negative for visual changes, syncope, or dizziness All other systems reviewed and are otherwise negative except as noted above.    Blood pressure 124/82, pulse 63, height 5\' 3"  (1.6 m), weight 222 lb 12.8 oz (101.1 kg).  General appearance: alert and no distress Neck: no adenopathy, no carotid bruit, no JVD, supple, symmetrical, trachea midline and thyroid not enlarged, symmetric, no tenderness/mass/nodules Lungs: clear to auscultation bilaterally Heart: regular rate and rhythm, S1, S2 normal, no murmur, click, rub or gallop Extremities: extremities normal, atraumatic, no cyanosis or edema  EKG normal sinus rhythm at 63  without  ST or T-wave changes. I personally reviewed this EKG  ASSESSMENT AND PLAN:   Essential hypertension History of hypertension blood pressure measured today at 124/82. She is on amlodipine, lisinopril and Lopressor. Continue current meds at current dosing  Hyperlipidemia History of hyperlipidemia on Lipitor. Her last lipid profile performed 05/20/15 revealed total cholesterol 298, LDL 204 and HDL 61. We will recheck a lipid and liver profile  CAD  (coronary artery disease), possible SCAD History of CAD with possible SCAD and multiple cardiac catheterizations revealing an occluded LAD. Medical therapy has been recommended. She has remained asymptomatic.      Lorretta Harp MD FACP,FACC,FAHA, [facility] 06/08/2016 2:07 PM

## 2016-06-08 NOTE — Assessment & Plan Note (Signed)
History of hyperlipidemia on Lipitor. Her last lipid profile performed 05/20/15 revealed total cholesterol 298, LDL 204 and HDL 61. We will recheck a lipid and liver profile

## 2016-06-08 NOTE — Assessment & Plan Note (Signed)
History of CAD with possible SCAD and multiple cardiac catheterizations revealing an occluded LAD. Medical therapy has been recommended. She has remained asymptomatic.

## 2016-06-14 ENCOUNTER — Other Ambulatory Visit: Payer: Self-pay | Admitting: Cardiovascular Disease

## 2016-06-14 NOTE — Telephone Encounter (Signed)
REFILL 

## 2016-06-16 ENCOUNTER — Telehealth: Payer: Self-pay | Admitting: Cardiovascular Disease

## 2016-06-16 DIAGNOSIS — R5383 Other fatigue: Secondary | ICD-10-CM

## 2016-06-16 NOTE — Telephone Encounter (Signed)
.  New message      Pt states that Dr Gwenlyn Found want her to see an endiocrinologist-- Dr Buddy Duty.  She tried to make her own appt.  Dr Buddy Duty sees pts by referral only.  Please call and schedule pt an appt and send ov notes to Dr Buddy Duty.  Pt did not know his first name

## 2016-06-16 NOTE — Telephone Encounter (Signed)
See patient inquiry. I reviewed recent OV notes, did not see mention of endocrinology referral.  Routed to Dr. Gwenlyn Found to verify OK to order.

## 2016-06-17 NOTE — Telephone Encounter (Signed)
Referral placed, patient aware and voiced thanks.

## 2016-06-17 NOTE — Telephone Encounter (Signed)
Okay to refer her to Dr. Buddy Duty for endocrinology appointment

## 2016-06-22 ENCOUNTER — Telehealth: Payer: Self-pay | Admitting: Cardiovascular Disease

## 2016-06-22 NOTE — Telephone Encounter (Signed)
Pt is calling because she is coming down with a cold and would like to know the best medication to take

## 2016-06-22 NOTE — Telephone Encounter (Signed)
Returned call to patient.She stated she has cold symptoms,runny nose.Advised she can take any antihistamine with no decongestant.

## 2016-06-23 ENCOUNTER — Other Ambulatory Visit: Payer: Self-pay | Admitting: Cardiovascular Disease

## 2016-06-28 ENCOUNTER — Telehealth: Payer: Self-pay | Admitting: Cardiovascular Disease

## 2016-06-28 NOTE — Telephone Encounter (Signed)
Patient is calling regarding being referred by Dr. Gwenlyn Found to see Dr. Robina Ade.  She said that she spoke with Ovid Curd who said that Dr. Gwenlyn Found was going to make the appointment and call her with the date.  She has not heard from Dr. Gwenlyn Found or Ovid Curd regarding this. Please return patient's call.

## 2016-06-28 NOTE — Telephone Encounter (Signed)
Returned call to patient.She stated she has not heard back about appointment to be scheduled with Dr.Kerr for hypothyroidism. Dr.Jeffery Kerr's office called at # [phone] left message with his appointment secretary patient needs appointment.

## 2016-06-29 NOTE — Telephone Encounter (Signed)
Left message for patient to call us back if she does not hear from dr Buddy Duty by the end of the week.

## 2016-06-30 ENCOUNTER — Other Ambulatory Visit: Payer: Self-pay | Admitting: Cardiovascular Disease

## 2016-06-30 DIAGNOSIS — E785 Hyperlipidemia, unspecified: Secondary | ICD-10-CM

## 2016-07-01 DIAGNOSIS — K080 Exfoliation of teeth due to systemic causes: Secondary | ICD-10-CM | POA: Diagnosis not present

## 2016-07-06 DIAGNOSIS — L219 Seborrheic dermatitis, unspecified: Secondary | ICD-10-CM | POA: Diagnosis not present

## 2016-07-06 DIAGNOSIS — L309 Dermatitis, unspecified: Secondary | ICD-10-CM | POA: Diagnosis not present

## 2016-07-09 ENCOUNTER — Other Ambulatory Visit: Payer: Self-pay | Admitting: Cardiovascular Disease

## 2016-07-09 NOTE — Telephone Encounter (Signed)
Rx(s) sent to pharmacy electronically.  

## 2016-07-14 ENCOUNTER — Telehealth: Payer: Self-pay | Admitting: Cardiovascular Disease

## 2016-07-14 NOTE — Telephone Encounter (Signed)
New message  Pt wants to relay message to [facility]  Dr. Robina Ade never called back  Please call back

## 2016-07-14 NOTE — Telephone Encounter (Signed)
I've called Dr. Cindra Eves office and was put through to TJ's voice mail and have left a message requesting a call. Left msg for patient to call.

## 2016-07-21 NOTE — Telephone Encounter (Signed)
Spoke with pt, she has still not heard from dr [employer] office. Spoke with TJ at dr Cindra Eves, correct telephone number for the patient given. They report they need records for dr Buddy Duty to decide how soon the patient will be seen. Patient made aware of above and records faxed to [phone].

## 2016-07-27 ENCOUNTER — Other Ambulatory Visit: Payer: Self-pay | Admitting: Cardiovascular Disease

## 2016-07-27 NOTE — Telephone Encounter (Signed)
REFILL 

## 2016-07-31 ENCOUNTER — Encounter (HOSPITAL_BASED_OUTPATIENT_CLINIC_OR_DEPARTMENT_OTHER): Payer: Self-pay | Admitting: Emergency Medicine

## 2016-07-31 ENCOUNTER — Emergency Department (HOSPITAL_BASED_OUTPATIENT_CLINIC_OR_DEPARTMENT_OTHER)
Admission: EM | Admit: 2016-07-31 | Discharge: 2016-07-31 | Disposition: A | Discharge status: Home / Self Care | Payer: Federal, State, Local not specified - PPO | Attending: Dermatology | Admitting: Dermatology

## 2016-07-31 DIAGNOSIS — I2510 Atherosclerotic heart disease of native coronary artery without angina pectoris: Secondary | ICD-10-CM | POA: Insufficient documentation

## 2016-07-31 DIAGNOSIS — Z79899 Other long term (current) drug therapy: Secondary | ICD-10-CM | POA: Insufficient documentation

## 2016-07-31 DIAGNOSIS — I10 Essential (primary) hypertension: Secondary | ICD-10-CM | POA: Diagnosis not present

## 2016-07-31 DIAGNOSIS — Z7982 Long term (current) use of aspirin: Secondary | ICD-10-CM | POA: Insufficient documentation

## 2016-07-31 DIAGNOSIS — E039 Hypothyroidism, unspecified: Secondary | ICD-10-CM | POA: Diagnosis not present

## 2016-07-31 DIAGNOSIS — Z5321 Procedure and treatment not carried out due to patient leaving prior to being seen by health care provider: Secondary | ICD-10-CM | POA: Diagnosis not present

## 2016-07-31 DIAGNOSIS — R1110 Vomiting, unspecified: Secondary | ICD-10-CM | POA: Diagnosis not present

## 2016-07-31 MED ORDER — ONDANSETRON 4 MG PO TBDP
4.0000 mg | ORAL_TABLET | Freq: Once | ORAL | Status: AC | PRN
  Administered 2016-07-31: 4 mg via ORAL
  Filled 2016-07-31: qty 1

## 2016-07-31 NOTE — ED Triage Notes (Signed)
Patient reports N/V since 0600 this morning.  Reports diarrhea which just started.  Denies abdominal pain.

## 2016-08-04 DIAGNOSIS — G933 Postviral fatigue syndrome: Secondary | ICD-10-CM | POA: Diagnosis not present

## 2016-08-20 DIAGNOSIS — E559 Vitamin D deficiency, unspecified: Secondary | ICD-10-CM | POA: Diagnosis not present

## 2016-08-20 DIAGNOSIS — E039 Hypothyroidism, unspecified: Secondary | ICD-10-CM | POA: Diagnosis not present

## 2016-08-25 ENCOUNTER — Other Ambulatory Visit: Payer: Self-pay | Admitting: Cardiovascular Disease

## 2016-09-23 ENCOUNTER — Ambulatory Visit (INDEPENDENT_AMBULATORY_CARE_PROVIDER_SITE_OTHER): Payer: Federal, State, Local not specified - PPO | Admitting: Family Medicine

## 2016-09-23 VITALS — BP 116/82 | HR 70 | Temp 98.1°F | Ht 63.0 in | Wt 221.6 lb

## 2016-09-23 DIAGNOSIS — I10 Essential (primary) hypertension: Secondary | ICD-10-CM | POA: Diagnosis not present

## 2016-09-23 DIAGNOSIS — I2510 Atherosclerotic heart disease of native coronary artery without angina pectoris: Secondary | ICD-10-CM | POA: Diagnosis not present

## 2016-09-23 DIAGNOSIS — E669 Obesity, unspecified: Secondary | ICD-10-CM

## 2016-09-23 DIAGNOSIS — R5382 Chronic fatigue, unspecified: Secondary | ICD-10-CM

## 2016-09-23 DIAGNOSIS — R0981 Nasal congestion: Secondary | ICD-10-CM

## 2016-09-23 DIAGNOSIS — G479 Sleep disorder, unspecified: Secondary | ICD-10-CM | POA: Diagnosis not present

## 2016-09-23 NOTE — Patient Instructions (Addendum)
It was very nice to meet you today- I think that cardiac rehab for exercise would be a great idea!   We will also arrange for you to have a sleep study- if you do indeed have sleep apnea, getting this treated may be really helpful for you  For your nose/ ear issue, try using an OTC afrin nasal decongestant for 3-4 days; use this along with a nasal steroid spray (flonase) and then continue with only the flonase.  I think this will help to open up your ear but let me know if not helpful for you!   Please come and see me in about 6 months to check in- sooner if you need anything  Take care!

## 2016-09-23 NOTE — Progress Notes (Signed)
[place] [facility] [address], [place], [place] [address] [phone]  Date:  09/23/2016   Name:  [name]   DOB:  May 15, 1956   MRN:  [id number]  PCP:  Lamar Blinks, MD    Chief Complaint: Establish Care (Pt here to est care. c/o sinus headache and pressure)   History of Present Illness:  [name] is a 61 y.o. very pleasant female patient who presents with the following:  Here today to establish care.  She would like for [place] to be her PCP.   Followed by cardiology (Dr. Gwenlyn Found) for CAD, HTN, Hyperlipidemia. She had an episode of SCAD/?NSTEMI in 2014.   She had a cath but did not require any intervention.  Since then she has been followed closely She is not able to really exercise due to her heart problems. However she has recently retired and plans to try and go back to cardiac rehab so she can be monitored while she is exercising.    She does have nitro but has never needed to use it   Notes that she feels tired and bad all the time, has for years.  Also she keeps gaining weight. However she does not feel that she is depressed.  She uses xanax for anxiety but does not think that depression is an issue for her  She is interested in a sleep study to determine if she may have OSA.  She is unsure if she does snore.  However she is suspicious about OSA due to poor sleep and fatigue.    She is a never smoker  She recently retired from the [facility] full labs on 02-07-2015 but also has labs from [place] in January   She relates that she had a thyroid problem and was on ?thyroid replacement in the past.   However more recently her thyroid has been in normal range.  She is frustrated because she had hoped that she could be treated for hypothyroidism and that it might help with her energy and weight issues  She has been dx with low vitamin D; however she has not yet started taking her vitamin D supplement    Her last mammo was about one year ago- she will have this updated in May She also does have a GI doctor who she will see to update her colonoscopy this year She also notes that her sinuses seem to be congested again, and that her left ear is stuffy (not painful)   BP Readings from Last 3 Encounters:  09/23/16 116/82  07/31/16 126/84  06/08/16 124/82   Wt Readings from Last 3 Encounters:  09/23/16 221 lb 9.6 oz (100.5 kg)  07/31/16 210 lb (95.3 kg)  06/08/16 222 lb 12.8 oz (101.1 kg)     Patient Active Problem List   Diagnosis Date Noted  . 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 Tachycardia, somewhat irregular, episodic 10/08/2013  . Dizziness 10/08/2013  . Angina effort ([place]) 05/22/2013  . Unstable angina ([place]) 01/22/2013  . Hyperlipidemia 01/01/2013  . CAD (coronary artery disease), possible SCAD 12/14/2012  . NSTEMI (non-ST elevated myocardial infarction) ([place]) 12/10/2012  . Essential hypertension 12/10/2012  . Hypothyroidism 12/10/2012  . Obesity (BMI 35.0-39.9 without comorbidity) 12/10/2012  . Gastroenteritis 10/08/2011  . Acute ischemic colitis ([place]) 08/09/2011  . DYSPHAGIA UNSPECIFIED 07/01/2010  . DIARRHEA OF PRESUMED INFECTIOUS ORIGIN 11/04/2009  . RECTAL BLEEDING Jan 2013 11/04/2009  . GERD 02/11/2009  .  Irritable bowel syndrome 02/11/2009  . PERSONAL HX COLONIC POLYPS 02/11/2009    Past Medical History:  Diagnosis Date  . Angina effort ([place]) 05/22/2013  . Anxiety   . CAD (coronary artery disease), possible SCAD 12/14/2012  . Colon polyps   . Duodenitis   . Dyspnea on exertion    LEXISCAN, 09/30/2008 - normal, EKG negative for ischemia, no ECG changes  . Esophageal stricture   . Gastritis   . GERD (gastroesophageal reflux disease)   . Gout    "only from RX given after MI" (01/22/2013)  . H/O hiatal hernia   . History of esophageal stricture   . Hypertension    2D ECHO, 09/30/2008 - EF >55%, normal: RENAL DOPPLER, 03/21/2007 - normal duplex study  .  Hypothyroidism   . IBS (irritable bowel syndrome)   . Ischemic colitis ([place])   . Lower GI bleeding    10/07/11 "I've had 3 episodes in the past year"  . Myocardial infarction 12/10/2012   "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)  . Tubular adenoma polyp of rectum 2008    Past Surgical History:  Procedure Laterality Date  . ABDOMINAL HYSTERECTOMY  1999  . ANTERIOR LUMBAR FUSION  2010   "L4-5" (01/22/2013)  . BACK SURGERY  09/2008  . CARDIAC CATHETERIZATION N/A 02/06/2015   Procedure: Left Heart Cath and Coronary Angiography;  Surgeon: Leonie Man, MD;  [facility];  Service: Cardiovascular;  Laterality: N/A;  . ESOPHAGEAL DILATION  08/2010  . ESOPHAGEAL DILATION     "once or twice" (01/22/2013)  . HAMMER TOE SURGERY Bilateral ~ 2002  . LEFT HEART CATHETERIZATION WITH CORONARY ANGIOGRAM N/A 12/11/2012   Procedure: LEFT HEART CATHETERIZATION WITH CORONARY ANGIOGRAM;  Surgeon: Leonie Man, MD;  Location: [facility] CATH LAB;  Service: Cardiovascular;  Laterality: N/A;  . LEFT HEART CATHETERIZATION WITH CORONARY ANGIOGRAM N/A 12/15/2012   Procedure: LEFT HEART CATHETERIZATION WITH CORONARY ANGIOGRAM;  Surgeon: Leonie Man, MD;  Location: [facility] CATH LAB;  Service: Cardiovascular;  Laterality: N/A;  . PERIPHERAL VENOUS STUDY  10/03/2008   No evidence of DVT, superficial thrombosis, or Baker's cyst  . TONSILLECTOMY AND ADENOIDECTOMY  1960's  . TUBAL LIGATION  1985    Social History  Substance Use Topics  . Smoking status: Never Smoker  . Smokeless tobacco: Never Used  . Alcohol use No    Family History  Problem Relation Age of Onset  . Colon cancer Brother   . Kidney disease Mother   . Stroke Father   . Heart attack Brother     No Known Allergies  Medication list has been reviewed and updated.  Current Outpatient Prescriptions on File Prior to Visit  Medication Sig Dispense Refill  . ALPRAZolam (XANAX) 0.5 MG tablet Take 0.5 mg by mouth daily.     Marland Kitchen  amLODipine (NORVASC) 5 MG tablet Take 1 tablet (5 mg total) by mouth daily. 30 tablet 11  . aspirin EC 81 MG EC tablet Take 1 tablet (81 mg total) by mouth daily.    Marland Kitchen atorvastatin (LIPITOR) 40 MG tablet Take 1 tablet (40 mg total) by mouth daily at 6 PM. 30 tablet 9  . clopidogrel (PLAVIX) 75 MG tablet TAKE 1 TABLET BY MOUTH ONCE A DAY WITH BREAKFAST 30 tablet 10  . estradiol (ESTRACE) 0.5 MG tablet Take 1 mg by mouth daily.     . isosorbide mononitrate (IMDUR) 60 MG 24 hr tablet TAKE 1 TABLET BY MOUTH ONCE A DAY 30 tablet  11  . lisinopril (PRINIVIL,ZESTRIL) 5 MG tablet TAKE 1 TABLET BY MOUTH DAILY. 90 tablet 3  . MAGNESIUM PO Take 1 tablet by mouth daily.    . metoprolol tartrate (LOPRESSOR) 25 MG tablet TAKE 1 TABLET BY MOUTH TWICE A DAY 60 tablet 11  . nitroGLYCERIN (NITROSTAT) 0.4 MG SL tablet Place 1 tablet (0.4 mg total) under the tongue every 5 (five) minutes as needed for chest pain. 25 tablet 5  . pantoprazole (PROTONIX) 40 MG tablet TAKE 1 TABLET BY MOUTH TWICE A DAY 60 tablet 10  . pravastatin (PRAVACHOL) 40 MG tablet TAKE 1 TABLET BY MOUTH EVERY EVENING. 30 tablet 11  . RANEXA 1000 MG SR tablet TAKE 1 TABLET BY MOUTH 2 TIMES DAILY. 180 tablet 2  . [DISCONTINUED] phentermine 15 MG capsule Take 15 mg by mouth every morning.       No current facility-administered medications on file prior to visit.     Review of Systems:  As per HPI- otherwise negative.   Physical Examination: Vitals:   09/23/16 0922  BP: 116/82  Pulse: 70  Temp: 98.1 F (36.7 C)   Vitals:   09/23/16 0922  Weight: 221 lb 9.6 oz (100.5 kg)  Height: 5\' 3"  (1.6 m)   Body mass index is 39.25 kg/m. Ideal Body Weight: Weight in (lb) to have BMI = 25: 140.8  GEN: WDWN, NAD, Non-toxic, A & O x 3, obese, otherwise looks well HEENT: Atraumatic, Normocephalic. Neck supple. No masses, No LAD.  Bilateral TM wnl, oropharynx normal.  PEERL,EOMI.    She has some nasal cavity inflammation and congestion, but ears  are normal to exam  Ears and Nose: No external deformity. CV: RRR, No M/G/R. No JVD. No thrill. No extra heart sounds. PULM: CTA B, no wheezes, crackles, rhonchi. No retractions. No resp. distress. No accessory muscle use. ABD: S, NT, ND EXTR: No c/c/e NEURO Normal gait.  PSYCH: Normally interactive. Conversant. Not depressed or anxious appearing.  Calm demeanor.    Assessment and Plan:  Essential hypertension  Coronary artery disease involving native heart without angina pectoris, unspecified vessel or lesion type  Nasal congestion  Chronic fatigue - Plan: Ambulatory referral to Neurology  Sleep difficulties - Plan: Ambulatory referral to Neurology  Obesity with serious comorbidity, unspecified classification, unspecified obesity type     Here today as a new patient.  Her main concern is chronic fatigue and weight gain I certainly agree that a sleep study is a good idea and will arrange for her Her BP is under good control She is following with Dr. Gwenlyn Found Discussed ways to manage her nasal and ear symptoms as below Encouraged her to go to cardiac rehab so she can get more exercise and work on her weight  It was very nice to meet you today- I think that cardiac rehab for exercise would be a great idea!   We will also arrange for you to have a sleep study- if you do indeed have sleep apnea, getting this treated may be really helpful for you  For your nose/ ear issue, try using an OTC afrin nasal decongestant for 3-4 days; use this along with a nasal steroid spray (flonase) and then continue with only the flonase.  I think this will help to open up your ear but let me know if not helpful for you!   Please come and see me in about 6 months to check in- sooner if you need anything  Signed Lamar Blinks, MD

## 2016-09-28 ENCOUNTER — Telehealth: Payer: Self-pay | Admitting: Cardiovascular Disease

## 2016-09-28 DIAGNOSIS — I2510 Atherosclerotic heart disease of native coronary artery without angina pectoris: Secondary | ICD-10-CM

## 2016-09-28 DIAGNOSIS — I10 Essential (primary) hypertension: Secondary | ICD-10-CM

## 2016-09-28 NOTE — Telephone Encounter (Signed)
lmtcb

## 2016-09-28 NOTE — Telephone Encounter (Signed)
Returned call-patient states she is now retired and would like to participate in cardiac rehab.  Advised I would have to route to Dr. Gwenlyn Found to approve.  Pt verbalized understanding.

## 2016-09-28 NOTE — Telephone Encounter (Signed)
New message      Pt want to go to cardiac rehab but has to be referred.  Will Dr Gwenlyn Found refer her?  Please call

## 2016-09-28 NOTE — Telephone Encounter (Signed)
Follow Up    Returning your call about rehab please call

## 2016-09-30 ENCOUNTER — Encounter: Payer: Self-pay | Admitting: Neurology

## 2016-09-30 ENCOUNTER — Ambulatory Visit (INDEPENDENT_AMBULATORY_CARE_PROVIDER_SITE_OTHER): Payer: Federal, State, Local not specified - PPO | Admitting: Neurology

## 2016-09-30 VITALS — BP 137/83 | HR 68 | Resp 20 | Ht 63.0 in | Wt 218.0 lb

## 2016-09-30 DIAGNOSIS — I209 Angina pectoris, unspecified: Secondary | ICD-10-CM | POA: Diagnosis not present

## 2016-09-30 DIAGNOSIS — E669 Obesity, unspecified: Secondary | ICD-10-CM

## 2016-09-30 DIAGNOSIS — R400 Somnolence: Secondary | ICD-10-CM

## 2016-09-30 DIAGNOSIS — R0683 Snoring: Secondary | ICD-10-CM

## 2016-09-30 DIAGNOSIS — R351 Nocturia: Secondary | ICD-10-CM

## 2016-09-30 DIAGNOSIS — I214 Non-ST elevation (NSTEMI) myocardial infarction: Secondary | ICD-10-CM

## 2016-09-30 DIAGNOSIS — R51 Headache: Secondary | ICD-10-CM | POA: Diagnosis not present

## 2016-09-30 DIAGNOSIS — I2542 Coronary artery dissection: Secondary | ICD-10-CM

## 2016-09-30 DIAGNOSIS — R519 Headache, unspecified: Secondary | ICD-10-CM

## 2016-09-30 NOTE — Patient Instructions (Addendum)

## 2016-09-30 NOTE — Progress Notes (Signed)
Subjective:    Patient ID: [name] is a 61 y.o. female.  HPI     Star Age, MD, PhD [facility] [address] [place], [place] [address]  Dear Dr. Lorelei Pont,   I saw your patient, [name], upon your kind request in my neurologic clinic today for initial consultation of her sleep disorder, in particular, concern for underlying obstructive sleep apnea. The patient is unaccompanied today. As you know, Ms. Comly is a 61 year old right-handed woman with an underlying medical history of hypertension, hyperlipidemia, coronary artery disease with history of spontaneous coronary artery dissection in June 2014, anxiety, vitamin D deficiency, history of non-STEMI, hypothyroidism, history of ischemic colitis, reflux disease, irritable bowel syndrome, and obesity, who reports excessive daytime somnolence and snoring, sleep maintenance issues, lack of energy, feeling tired and having low exercise tolerance. She is hoping to start cardiac rehabilitation soon, she is followed by Dr. Alvester Chou for her history of non-STEMI and spontaneous coronary artery dissection in June 2014. She had a cardiac catheterization in 2016 as well. I reviewed your office note from 09/23/2016.  Her Epworth sleepiness score is 12 out of 24 today, her fatigue score is 53 out of 63. She lives alone, she is retired, worked for [employer], she has 2 grown children, 60 yo and 29 yo son, has 2 GDs. She is a nonsmoker, drinks alcohol infrequently, once a month or so, drinks tea in the morning but limits her caffeine intake to only 1 serving in the morning. She has had weight gain. She has not been exercising, she does have angina with exertion, often minimal exertion, she is sometimes worried about having chest pain and discomfort and limits her physical activity quite a bit. She denies restless leg symptoms or leg movements at night, she denies morning headaches but has had some  recurrent headaches in the past few weeks, attributed primarily to sinus issues. She reports that her cardiologist had suggested that she undergo a sleep study.   Her Past Medical History Is Significant For: Past Medical History:  Diagnosis Date  . Angina effort ([place]) 05/22/2013  . Anxiety   . CAD (coronary artery disease), possible SCAD 12/14/2012  . Colon polyps   . Duodenitis   . Dyspnea on exertion    LEXISCAN, 09/30/2008 - normal, EKG negative for ischemia, no ECG changes  . Esophageal stricture   . Gastritis   . GERD (gastroesophageal reflux disease)   . Gout    "only from RX given after MI" (01/22/2013)  . H/O hiatal hernia   . History of esophageal stricture   . Hypertension    2D ECHO, 09/30/2008 - EF >55%, normal: RENAL DOPPLER, 03/21/2007 - normal duplex study  . Hypothyroidism   . IBS (irritable bowel syndrome)   . Ischemic colitis ([place])   . Lower GI bleeding    10/07/11 "I've had 3 episodes in the past year"  . Myocardial infarction 12/10/2012   "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)  . Tubular adenoma polyp of rectum 2008    Her Past Surgical History Is Significant For: Past Surgical History:  Procedure Laterality Date  . ABDOMINAL HYSTERECTOMY  1999  . ANTERIOR LUMBAR FUSION  2010   "L4-5" (01/22/2013)  . BACK SURGERY  09/2008  . CARDIAC CATHETERIZATION N/A 02/06/2015   Procedure: Left Heart Cath and Coronary Angiography;  Surgeon: Leonie Man, MD;  Location: [facility] CV LAB;  Service: Cardiovascular;  Laterality: N/A;  . ESOPHAGEAL  DILATION  08/2010  . ESOPHAGEAL DILATION     "once or twice" (01/22/2013)  . HAMMER TOE SURGERY Bilateral ~ 2002  . LEFT HEART CATHETERIZATION WITH CORONARY ANGIOGRAM N/A 12/11/2012   Procedure: LEFT HEART CATHETERIZATION WITH CORONARY ANGIOGRAM;  Surgeon: Leonie Man, MD;  Location: [facility] CATH LAB;  Service: Cardiovascular;  Laterality: N/A;  . LEFT HEART CATHETERIZATION WITH CORONARY ANGIOGRAM N/A 12/15/2012    Procedure: LEFT HEART CATHETERIZATION WITH CORONARY ANGIOGRAM;  Surgeon: Leonie Man, MD;  Location: [facility] CATH LAB;  Service: Cardiovascular;  Laterality: N/A;  . PERIPHERAL VENOUS STUDY  10/03/2008   No evidence of DVT, superficial thrombosis, or Baker's cyst  . TONSILLECTOMY AND ADENOIDECTOMY  1960's  . TUBAL LIGATION  1985    Her Family History Is Significant For: Family History  Problem Relation Age of Onset  . Colon cancer Brother   . Kidney disease Mother   . Stroke Father   . Heart attack Brother     Her Social History Is Significant For: Social History   Social History  . Marital status: Single    Spouse name: N/A  . Number of children: 2  . Years of education: N/A   Occupational History  . Drug Editor, commissioning   Social History Main Topics  . Smoking status: Never Smoker  . Smokeless tobacco: Never Used  . Alcohol use No  . Drug use: No  . Sexual activity: No   Other Topics Concern  . None   Social History Narrative   Daily caffeine use.    Her Allergies Are:  No Known Allergies:   Her Current Medications Are:  Outpatient Encounter Prescriptions as of 09/30/2016  Medication Sig  . ALPRAZolam (XANAX) 0.5 MG tablet Take 0.5 mg by mouth daily.   Marland Kitchen amLODipine (NORVASC) 5 MG tablet Take 1 tablet (5 mg total) by mouth daily.  Marland Kitchen aspirin EC 81 MG EC tablet Take 1 tablet (81 mg total) by mouth daily.  Marland Kitchen atorvastatin (LIPITOR) 40 MG tablet Take 1 tablet (40 mg total) by mouth daily at 6 PM.  . clopidogrel (PLAVIX) 75 MG tablet TAKE 1 TABLET BY MOUTH ONCE A DAY WITH BREAKFAST  . estradiol (ESTRACE) 0.5 MG tablet Take 1 mg by mouth daily.   . isosorbide mononitrate (IMDUR) 60 MG 24 hr tablet TAKE 1 TABLET BY MOUTH ONCE A DAY  . lisinopril (PRINIVIL,ZESTRIL) 5 MG tablet TAKE 1 TABLET BY MOUTH DAILY.  Marland Kitchen MAGNESIUM PO Take 1 tablet by mouth daily.  . metoprolol tartrate (LOPRESSOR) 25 MG tablet TAKE 1 TABLET BY MOUTH TWICE A DAY  . nitroGLYCERIN  (NITROSTAT) 0.4 MG SL tablet Place 1 tablet (0.4 mg total) under the tongue every 5 (five) minutes as needed for chest pain.  Marland Kitchen oxymetazoline (AFRIN) 0.05 % nasal spray Place 1 spray into both nostrils 2 (two) times daily.  . pantoprazole (PROTONIX) 40 MG tablet TAKE 1 TABLET BY MOUTH TWICE A DAY  . pravastatin (PRAVACHOL) 40 MG tablet TAKE 1 TABLET BY MOUTH EVERY EVENING.  . RANEXA 1000 MG SR tablet TAKE 1 TABLET BY MOUTH 2 TIMES DAILY.   No facility-administered encounter medications on file as of 09/30/2016.   :  Review of Systems:  Out of a complete 14 point review of systems, all are reviewed and negative with the exception of these symptoms as listed below:  Review of Systems  Neurological:       Pt presents today to discuss her sleep.  Pt does not think she snores and has never had a sleep study.  Epworth Sleepiness Scale 0= would never doze 1= slight chance of dozing 2= moderate chance of dozing 3= high chance of dozing  Sitting and reading: 3 Watching TV: 2 Sitting inactive in a public place (ex. Theater or meeting): 2 As a passenger in a car for an hour without a break: 2 Lying down to rest in the afternoon: 3 Sitting and talking to someone: 0 Sitting quietly after lunch (no alcohol): 0 In a car, while stopped in traffic: 0 Total: 12     Objective:  Neurologic Exam  Physical Exam Physical Examination:   Vitals:   09/30/16 1100  BP: 137/83  Pulse: 68  Resp: 20   General Examination: The patient is a very pleasant 61 y.o. female in no acute distress. She appears well-developed and well-nourished and well groomed.   HEENT: Normocephalic, atraumatic, pupils are equal, round and reactive to light and accommodation. Funduscopic exam is normal with sharp disc margins noted. Extraocular tracking is good without limitation to gaze excursion or nystagmus noted. Normal smooth pursuit is noted. Hearing is grossly intact. Tympanic membranes are clear bilaterally. Face is  symmetric with normal facial animation and normal facial sensation. Speech is clear with no dysarthria noted. There is no hypophonia. There is no lip, neck/head, jaw or voice tremor. Neck is supple with full range of passive and active motion. There are no carotid bruits on auscultation. Oropharynx exam reveals: mild mouth dryness, adequate dental hygiene and mild airway crowding, due to  smaller airway entry, redundant soft palate, tonsils are absent, Mallampati is class II. Neck circumference is 15-3/4 inches. She has a minimal to mild overbite. Tongue protrudes centrally and palate elevates symmetrically.   Chest: Clear to auscultation without wheezing, rhonchi or crackles noted.  Heart: S1+S2+0, regular and normal without murmurs, rubs or gallops noted.   Abdomen: Soft, non-tender and non-distended with normal bowel sounds appreciated on auscultation.  Extremities: There is trace pitting edema in the distal lower extremities bilaterally, both feet and around ankles. Pedal pulses are intact.  Skin: Warm and dry without trophic changes noted.  Musculoskeletal: exam reveals no obvious joint deformities, tenderness or joint swelling or erythema.   Neurologically:  Mental status: The patient is awake, alert and oriented in all 4 spheres. Her immediate and remote memory, attention, language skills and fund of knowledge are appropriate. There is no evidence of aphasia, agnosia, apraxia or anomia. Speech is clear with normal prosody and enunciation. Thought process is linear. Mood is normal and affect is normal.  Cranial nerves II - XII are as described above under HEENT exam. In addition: shoulder shrug is normal with equal shoulder height noted. Motor exam: Normal bulk, strength and tone is noted. There is no drift, tremor or rebound. Romberg is negative. Reflexes are 2+ throughout. Babinski: Toes are downgoing. Fine motor skills and coordination: intact with normal finger taps, normal hand movements,  normal rapid alternating patting, normal foot taps and normal foot agility.  Cerebellar testing: No dysmetria or intention tremor on finger to nose testing. Heel to shin is unremarkable bilaterally. There is no truncal or gait ataxia.  Sensory exam: intact to light touch, pinprick, vibration in the upper and lower extremities.  Gait, station and balance: She stands easily. No veering to one side is noted. No leaning to one side is noted. Posture is age-appropriate and stance is narrow based. Gait shows normal stride length and normal pace. No  problems turning are noted. Tandem walk is difficult for her.   Assessment and Plan:  In summary, JETZABEL LASSERE is a very pleasant 61 y.o.-year old female with an underlying medical history of hypertension, hyperlipidemia, coronary artery disease with history of spontaneous coronary artery dissection in June 2014, anxiety, vitamin D deficiency, history of non-STEMI, hypothyroidism, history of ischemic colitis, reflux disease, irritable bowel syndrome, and obesity, whose history and physical exam are concerning for obstructive sleep apnea (OSA). I had a long chat with the patient about my findings and the diagnosis of OSA, its prognosis and treatment options. We talked about medical treatments, surgical interventions and non-pharmacological approaches. I explained in particular the risks and ramifications of untreated moderate to severe OSA, especially with respect to developing cardiovascular disease down the Road, including congestive heart failure, difficult to treat hypertension, cardiac arrhythmias, or stroke. Even type 2 diabetes has, in part, been linked to untreated OSA. Symptoms of untreated OSA include daytime sleepiness, memory problems, mood irritability and mood disorder such as depression and anxiety, lack of energy, as well as recurrent headaches, especially morning headaches. We talked about trying to maintain a healthy lifestyle in general, as well  as the importance of weight control. I encouraged the patient to eat healthy, exercise daily and keep well hydrated, to keep a scheduled bedtime and wake time routine, to not skip any meals and eat healthy snacks in between meals. I advised the patient not to drive when feeling sleepy. I recommended the following at this time: sleep study with potential positive airway pressure titration. (We will score hypopneas at 3%).   I explained the sleep test procedure to the patient and also outlined possible surgical and non-surgical treatment options of OSA, including the use of a custom-made dental device (which would require a referral to a specialist dentist or oral surgeon), upper airway surgical options, such as pillar implants, radiofrequency surgery, tongue base surgery, and UPPP (which would involve a referral to an ENT surgeon). Rarely, jaw surgery such as mandibular advancement may be considered.  I also explained the CPAP treatment option to the patient, who indicated that she would be willing to try CPAP if the need arises, but would not be able to use a FFM, willing to try nasal pillows. I explained the importance of being compliant with PAP treatment, not only for insurance purposes but primarily to improve Her symptoms, and for the patient's long term health benefit, including to reduce Her cardiovascular risks. I answered all her questions today and the patient was in agreement. I would like to see her back after the sleep study is completed and encouraged her to call with any interim questions, concerns, problems or updates.   Thank you very much for allowing me to participate in the care of this nice patient. If I can be of any further assistance to you please do not hesitate to call me at [phone].  Sincerely,   Star Age, MD, PhD

## 2016-10-02 NOTE — Telephone Encounter (Signed)
Absolutely. I encourage her to

## 2016-10-05 NOTE — Telephone Encounter (Signed)
Referral placed for Cardiac Rehab

## 2016-10-06 NOTE — Telephone Encounter (Signed)
Returned call to patient-made aware CR referral was made.    Also wanted to make Dr. Gwenlyn Found aware that she did not go see Dr. Buddy Duty.  Advised they never contacted her and she tried to reach out to them multiple times.  Asked if I could help try to get appt set up and patient refused at this time and is not interested in going to see him now.  Advised to let us know if she changes her mind.    Patient verbalized understanding.

## 2016-10-15 ENCOUNTER — Telehealth (HOSPITAL_COMMUNITY): Payer: Self-pay | Admitting: *Deleted

## 2016-10-15 NOTE — Telephone Encounter (Signed)
Received referral for cardiac rehab.  Left message for pt to please call for additional information. Pt resides in [facility] and unclear if diagnosis is supported for insurance reimbursement. Cherre Huger, BSN Cardiac and Training and development officer

## 2016-10-19 ENCOUNTER — Other Ambulatory Visit: Payer: Self-pay | Admitting: *Deleted

## 2016-10-19 ENCOUNTER — Encounter: Payer: Self-pay | Admitting: *Deleted

## 2016-10-19 DIAGNOSIS — I208 Other forms of angina pectoris: Secondary | ICD-10-CM

## 2016-10-19 DIAGNOSIS — I10 Essential (primary) hypertension: Secondary | ICD-10-CM

## 2016-10-19 DIAGNOSIS — I2510 Atherosclerotic heart disease of native coronary artery without angina pectoris: Secondary | ICD-10-CM

## 2016-10-19 DIAGNOSIS — I200 Unstable angina: Secondary | ICD-10-CM

## 2016-10-19 NOTE — Telephone Encounter (Signed)
This encounter was created in error - please disregard.

## 2016-10-22 ENCOUNTER — Other Ambulatory Visit (HOSPITAL_COMMUNITY): Payer: Self-pay | Admitting: *Deleted

## 2016-11-11 ENCOUNTER — Encounter (HOSPITAL_COMMUNITY): Payer: Self-pay

## 2016-11-11 ENCOUNTER — Encounter (HOSPITAL_COMMUNITY)
Admission: RE | Admit: 2016-11-11 | Discharge: 2016-11-11 | Disposition: A | Discharge status: Home / Self Care | Payer: Federal, State, Local not specified - PPO | Source: Ambulatory Visit | Attending: Cardiovascular Disease | Admitting: Cardiovascular Disease

## 2016-11-11 VITALS — BP 118/82 | HR 72 | Ht 62.0 in | Wt 222.2 lb

## 2016-11-11 DIAGNOSIS — I25118 Atherosclerotic heart disease of native coronary artery with other forms of angina pectoris: Secondary | ICD-10-CM | POA: Diagnosis not present

## 2016-11-11 DIAGNOSIS — Z79899 Other long term (current) drug therapy: Secondary | ICD-10-CM | POA: Insufficient documentation

## 2016-11-11 DIAGNOSIS — E039 Hypothyroidism, unspecified: Secondary | ICD-10-CM | POA: Diagnosis not present

## 2016-11-11 DIAGNOSIS — K219 Gastro-esophageal reflux disease without esophagitis: Secondary | ICD-10-CM | POA: Diagnosis not present

## 2016-11-11 DIAGNOSIS — I10 Essential (primary) hypertension: Secondary | ICD-10-CM | POA: Insufficient documentation

## 2016-11-11 DIAGNOSIS — E109 Type 1 diabetes mellitus without complications: Secondary | ICD-10-CM | POA: Diagnosis not present

## 2016-11-11 DIAGNOSIS — I252 Old myocardial infarction: Secondary | ICD-10-CM | POA: Diagnosis not present

## 2016-11-11 DIAGNOSIS — Z7982 Long term (current) use of aspirin: Secondary | ICD-10-CM | POA: Diagnosis not present

## 2016-11-11 DIAGNOSIS — Z7902 Long term (current) use of antithrombotics/antiplatelets: Secondary | ICD-10-CM | POA: Insufficient documentation

## 2016-11-11 DIAGNOSIS — K589 Irritable bowel syndrome without diarrhea: Secondary | ICD-10-CM | POA: Insufficient documentation

## 2016-11-11 DIAGNOSIS — F419 Anxiety disorder, unspecified: Secondary | ICD-10-CM | POA: Insufficient documentation

## 2016-11-11 DIAGNOSIS — I208 Other forms of angina pectoris: Secondary | ICD-10-CM

## 2016-11-11 NOTE — Progress Notes (Signed)
Cardiac Rehab Medication Review by a Pharmacist  Does the patient  feel that his/her medications are working for him/her?  yes  Has the patient been experiencing any side effects to the medications prescribed?  no  Does the patient measure his/her own blood pressure or blood glucose at home?  no   Does the patient have any problems obtaining medications due to transportation or finances?   no  Understanding of regimen: good Understanding of indications: good Potential of compliance: good   Pharmacist comments: [name] presents today for medication review. She understands her regimen well and reports good adherence. For now, she does not use a pill box or alarm, but states it is part of her daily routine to take her medications. She denies any side effects except for weight gain but is not sure if the medications are causing it. Counseled on importance of these medications for heart health. Patient verbalized understanding.   [name], PharmD PGY1 Pharmacy Resident Pager: [phone] 11/11/2016 9:04 AM

## 2016-11-11 NOTE — Progress Notes (Signed)
Cardiac Individual Treatment Plan  Patient Details  Name: [name] MRN: [id number] Date of Birth: 07/10/1956 Referring Provider:     CARDIAC REHAB PHASE II ORIENTATION from 11/11/2016 in [place]  Referring Provider  Quay Burow MD      Initial Encounter Date:    CARDIAC REHAB PHASE II ORIENTATION from 11/11/2016 in [place]  Date  11/11/16  Referring Provider  Quay Burow MD      Visit Diagnosis: Stable angina [facility])  Patient's Home Medications on Admission:  Current Outpatient Prescriptions:  .  ALPRAZolam (XANAX) 0.5 MG tablet, Take 0.5 mg by mouth daily. , Disp: , Rfl:  .  amLODipine (NORVASC) 5 MG tablet, Take 1 tablet (5 mg total) by mouth daily., Disp: 30 tablet, Rfl: 11 .  aspirin EC 81 MG EC tablet, Take 1 tablet (81 mg total) by mouth daily., Disp: , Rfl:  .  atorvastatin (LIPITOR) 40 MG tablet, Take 1 tablet (40 mg total) by mouth daily at 6 PM., Disp: 30 tablet, Rfl: 9 .  clopidogrel (PLAVIX) 75 MG tablet, TAKE 1 TABLET BY MOUTH ONCE A DAY WITH BREAKFAST, Disp: 30 tablet, Rfl: 10 .  estradiol (ESTRACE) 0.5 MG tablet, Take 1 mg by mouth daily. , Disp: , Rfl:  .  isosorbide mononitrate (IMDUR) 60 MG 24 hr tablet, TAKE 1 TABLET BY MOUTH ONCE A DAY, Disp: 30 tablet, Rfl: 11 .  lisinopril (PRINIVIL,ZESTRIL) 5 MG tablet, TAKE 1 TABLET BY MOUTH DAILY., Disp: 90 tablet, Rfl: 3 .  metoprolol tartrate (LOPRESSOR) 25 MG tablet, TAKE 1 TABLET BY MOUTH TWICE A DAY, Disp: 60 tablet, Rfl: 11 .  nitroGLYCERIN (NITROSTAT) 0.4 MG SL tablet, Place 1 tablet (0.4 mg total) under the tongue every 5 (five) minutes as needed for chest pain., Disp: 25 tablet, Rfl: 5 .  pantoprazole (PROTONIX) 40 MG tablet, TAKE 1 TABLET BY MOUTH TWICE A DAY, Disp: 60 tablet, Rfl: 10 .  RANEXA 1000 MG SR tablet, TAKE 1 TABLET BY MOUTH 2 TIMES DAILY., Disp: 180 tablet, Rfl: 2  Past Medical History: Past Medical History:   Diagnosis Date  . Angina effort ([place]) 05/22/2013  . Anxiety   . CAD (coronary artery disease), possible SCAD 12/14/2012  . Colon polyps   . Duodenitis   . Dyspnea on exertion    LEXISCAN, 09/30/2008 - normal, EKG negative for ischemia, no ECG changes  . Esophageal stricture   . Gastritis   . GERD (gastroesophageal reflux disease)   . Gout    "only from RX given after MI" (01/22/2013)  . H/O hiatal hernia   . History of esophageal stricture   . Hypertension    2D ECHO, 09/30/2008 - EF >55%, normal: RENAL DOPPLER, 03/21/2007 - normal duplex study  . Hypothyroidism   . IBS (irritable bowel syndrome)   . Ischemic colitis ([place])   . Lower GI bleeding    10/07/11 "I've had 3 episodes in the past year"  . Myocardial infarction 12/10/2012   "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)  . Tubular adenoma polyp of rectum 2008    Tobacco Use: History  Smoking Status  . Never Smoker  Smokeless Tobacco  . Never Used    Labs: Recent Review Flowsheet Data    Labs for ITP Cardiac and Pulmonary Rehab Latest Ref Rng & Units 12/11/2012 04/15/2014 02/06/2015 02/07/2015 05/20/2015   Cholestrol 125 - 200 mg/dL 187 184 - 265(H) 298(H)   LDLCALC <130 mg/dL 103(H) 92 -  167(H) 204(H)   HDL >=46 mg/dL 52 63 - 60 61   Trlycerides <150 mg/dL 159(H) 145 - 192(H) 166(H)   Hemoglobin A1c 4.8 - 5.6 % - - 5.3 - -      Capillary Blood Glucose: Lab Results  Component Value Date   GLUCAP 97 12/12/2012   GLUCAP 91 12/12/2012   GLUCAP 81 10/03/2008     Exercise Target Goals: Date: 11/11/16  Exercise Program Goal: Individual exercise prescription set with THRR, safety & activity barriers. Participant demonstrates ability to understand and report RPE using BORG scale, to self-measure pulse accurately, and to acknowledge the importance of the exercise prescription.  Exercise Prescription Goal: Starting with aerobic activity 30 plus minutes a day, 3 days per week for initial exercise prescription.  Provide home exercise prescription and guidelines that participant acknowledges understanding prior to discharge.  Activity Barriers & Risk Stratification:     Activity Barriers & Cardiac Risk Stratification - 11/11/16 0932      Activity Barriers & Cardiac Risk Stratification   Activity Barriers Deconditioning;Muscular Weakness   Cardiac Risk Stratification High      6 Minute Walk:     6 Minute Walk    Row Name 11/11/16 1239         6 Minute Walk   Phase Initial     Distance 1350 feet     Walk Time 6 minutes     # of Rest Breaks 0     MPH 2.6     METS 2.8     RPE 9     VO2 Peak 9.7     Symptoms No     Resting HR 72 bpm     Resting BP 118/82     Max Ex. HR 95 bpm     Max Ex. BP 138/84     2 Minute Post BP 102/80        Oxygen Initial Assessment:   Oxygen Re-Evaluation:   Oxygen Discharge (Final Oxygen Re-Evaluation):   Initial Exercise Prescription:     Initial Exercise Prescription - 11/11/16 1200      Date of Initial Exercise RX and Referring Provider   Date 11/11/16   Referring Provider Quay Burow MD     Treadmill   MPH 2   Grade 0   Minutes 10   METs 2.53     Bike   Level 0.6   Minutes 10   METs 2.13     NuStep   Level 1   Minutes 10   METs 1.8     Prescription Details   Frequency (times per week) 5   Duration Progress to 45 minutes of aerobic exercise without signs/symptoms of physical distress     Intensity   THRR 40-80% of Max Heartrate 64-128   Ratings of Perceived Exertion 11-13   Perceived Dyspnea 0-4     Progression   Progression Continue to progress workloads to maintain intensity without signs/symptoms of physical distress.     Resistance Training   Training Prescription Yes   Weight 2   Reps 10-15      Perform Capillary Blood Glucose checks as needed.  Exercise Prescription Changes:   Exercise Comments:   Exercise Goals and Review:     Exercise Goals    Row Name 11/11/16 0936              Exercise Goals   Increase Physical Activity (P)  Yes  Build confidence with functional acitivites and ADL's  Intervention (P)  Provide advice, education, support and counseling about physical activity/exercise needs.;Develop an individualized exercise prescription for aerobic and resistive training based on initial evaluation findings, risk stratification, comorbidities and participant's personal goals.       Expected Outcomes (P)  Achievement of increased cardiorespiratory fitness and enhanced flexibility, muscular endurance and strength shown through measurements of functional capacity and personal statement of participant.       Increase Strength and Stamina (P)  Yes  Be able to climb stairs and uphill without fatigue and SOB       Intervention (P)  Provide advice, education, support and counseling about physical activity/exercise needs.;Develop an individualized exercise prescription for aerobic and resistive training based on initial evaluation findings, risk stratification, comorbidities and participant's personal goals.       Expected Outcomes (P)  Achievement of increased cardiorespiratory fitness and enhanced flexibility, muscular endurance and strength shown through measurements of functional capacity and personal statement of participant.          Exercise Goals Re-Evaluation :    Discharge Exercise Prescription (Final Exercise Prescription Changes):   Nutrition:  Target Goals: Understanding of nutrition guidelines, daily intake of sodium 1500mg , cholesterol 200mg , calories 30% from fat and 7% or less from saturated fats, daily to have 5 or more servings of fruits and vegetables.  Biometrics:     Pre Biometrics - 11/11/16 1245      Pre Biometrics   Waist Circumference 40.25 inches   Hip Circumference 52 inches   Waist to Hip Ratio 0.77 %   Triceps Skinfold 52 mm   % Body Fat 51.1 %   Grip Strength 35 kg   Flexibility 10 in   Single Leg Stand 8.03 seconds        Nutrition Therapy Plan and Nutrition Goals:   Nutrition Discharge: Nutrition Scores:   Nutrition Goals Re-Evaluation:   Nutrition Goals Re-Evaluation:   Nutrition Goals Discharge (Final Nutrition Goals Re-Evaluation):   Psychosocial: Target Goals: Acknowledge presence or absence of significant depression and/or stress, maximize coping skills, provide positive support system. Participant is able to verbalize types and ability to use techniques and skills needed for reducing stress and depression.  Initial Review & Psychosocial Screening:     Initial Psych Review & Screening - 11/11/16 [place]? Yes     Barriers   Psychosocial barriers to participate in program There are no identifiable barriers or psychosocial needs.     Screening Interventions   Interventions Encouraged to exercise      Quality of Life Scores:     Quality of Life - 11/11/16 1245      Quality of Life Scores   Health/Function Pre 18.96 %   Socioeconomic Pre 27.83 %   Psych/Spiritual Pre 14.57 %   Family Pre 22.5 %   GLOBAL Pre 20.15 %      PHQ-9: Recent Review Flowsheet Data    There is no flowsheet data to display.     Interpretation of Total Score  Total Score Depression Severity:  1-4 = Minimal depression, 5-9 = Mild depression, 10-14 = Moderate depression, 15-19 = Moderately severe depression, 20-27 = Severe depression   Psychosocial Evaluation and Intervention:   Psychosocial Re-Evaluation:   Psychosocial Discharge (Final Psychosocial Re-Evaluation):   Vocational Rehabilitation: Provide vocational rehab assistance to qualifying candidates.   Vocational Rehab Evaluation & Intervention:     Vocational Rehab - 11/11/16 7628  Initial Vocational Rehab Evaluation & Intervention   Assessment shows need for Vocational Rehabilitation No      Education: Education Goals: Education classes will be provided on a weekly basis,  covering required topics. Participant will state understanding/return demonstration of topics presented.  Learning Barriers/Preferences:     Learning Barriers/Preferences - 11/11/16 0935      Learning Barriers/Preferences   Learning Barriers Sight   Learning Preferences Written Material;Skilled Demonstration      Education Topics: Count Your Pulse:  -Group instruction provided by verbal instruction, demonstration, patient participation and written materials to support subject.  Instructors address importance of being able to find your pulse and how to count your pulse when at home without a heart monitor.  Patients get hands on experience counting their pulse with staff help and individually.   Heart Attack, Angina, and Risk Factor Modification:  -Group instruction provided by verbal instruction, video, and written materials to support subject.  Instructors address signs and symptoms of angina and heart attacks.    Also discuss risk factors for heart disease and how to make changes to improve heart health risk factors.   Functional Fitness:  -Group instruction provided by verbal instruction, demonstration, patient participation, and written materials to support subject.  Instructors address safety measures for doing things around the house.  Discuss how to get up and down off the floor, how to pick things up properly, how to safely get out of a chair without assistance, and balance training.   Meditation and Mindfulness:  -Group instruction provided by verbal instruction, patient participation, and written materials to support subject.  Instructor addresses importance of mindfulness and meditation practice to help reduce stress and improve awareness.  Instructor also leads participants through a meditation exercise.    Stretching for Flexibility and Mobility:  -Group instruction provided by verbal instruction, patient participation, and written materials to support subject.   Instructors lead participants through series of stretches that are designed to increase flexibility thus improving mobility.  These stretches are additional exercise for major muscle groups that are typically performed during regular warm up and cool down.   Hands Only CPR Anytime:  -Group instruction provided by verbal instruction, video, patient participation and written materials to support subject.  Instructors co-teach with AHA video for hands only CPR.  Participants get hands on experience with mannequins.   Nutrition I class: Heart Healthy Eating:  -Group instruction provided by PowerPoint slides, verbal discussion, and written materials to support subject matter. The instructor gives an explanation and review of the Therapeutic Lifestyle Changes diet recommendations, which includes a discussion on lipid goals, dietary fat, sodium, fiber, plant stanol/sterol esters, sugar, and the components of a well-balanced, healthy diet.   Nutrition II class: Lifestyle Skills:  -Group instruction provided by PowerPoint slides, verbal discussion, and written materials to support subject matter. The instructor gives an explanation and review of label reading, grocery shopping for heart health, heart healthy recipe modifications, and ways to make healthier choices when eating out.   Diabetes Question & Answer:  -Group instruction provided by PowerPoint slides, verbal discussion, and written materials to support subject matter. The instructor gives an explanation and review of diabetes co-morbidities, pre- and post-prandial blood glucose goals, pre-exercise blood glucose goals, signs, symptoms, and treatment of hypoglycemia and hyperglycemia, and foot care basics.   Diabetes Blitz:  -Group instruction provided by PowerPoint slides, verbal discussion, and written materials to support subject matter. The instructor gives an explanation and review of the physiology behind type  1 and type 2 diabetes, diabetes  medications and rational behind using different medications, pre- and post-prandial blood glucose recommendations and Hemoglobin A1c goals, diabetes diet, and exercise including blood glucose guidelines for exercising safely.    Portion Distortion:  -Group instruction provided by PowerPoint slides, verbal discussion, written materials, and food models to support subject matter. The instructor gives an explanation of serving size versus portion size, changes in portions sizes over the last 20 years, and what consists of a serving from each food group.   Stress Management:  -Group instruction provided by verbal instruction, video, and written materials to support subject matter.  Instructors review role of stress in heart disease and how to cope with stress positively.     Exercising on Your Own:  -Group instruction provided by verbal instruction, power point, and written materials to support subject.  Instructors discuss benefits of exercise, components of exercise, frequency and intensity of exercise, and end points for exercise.  Also discuss use of nitroglycerin and activating EMS.  Review options of places to exercise outside of rehab.  Review guidelines for sex with heart disease.   Cardiac Drugs I:  -Group instruction provided by verbal instruction and written materials to support subject.  Instructor reviews cardiac drug classes: antiplatelets, anticoagulants, beta blockers, and statins.  Instructor discusses reasons, side effects, and lifestyle considerations for each drug class.   Cardiac Drugs II:  -Group instruction provided by verbal instruction and written materials to support subject.  Instructor reviews cardiac drug classes: angiotensin converting enzyme inhibitors (ACE-I), angiotensin II receptor blockers (ARBs), nitrates, and calcium channel blockers.  Instructor discusses reasons, side effects, and lifestyle considerations for each drug class.   Anatomy and Physiology of the  Circulatory System:  -Group instruction provided by verbal instruction, video, and written materials to support subject.  Reviews functional anatomy of heart, how it relates to various diagnoses, and what role the heart plays in the overall system.   Knowledge Questionnaire Score:     Knowledge Questionnaire Score - 11/11/16 1238      Knowledge Questionnaire Score   Pre Score 18/24      Core Components/Risk Factors/Patient Goals at Admission:     Personal Goals and Risk Factors at Admission - 11/11/16 1654      Core Components/Risk Factors/Patient Goals on Admission    Weight Management Yes   Intervention Weight Management: Develop a combined nutrition and exercise program designed to reach desired caloric intake, while maintaining appropriate intake of nutrient and fiber, sodium and fats, and appropriate energy expenditure required for the weight goal.;Weight Management: Provide education and appropriate resources to help participant work on and attain dietary goals.;Weight Management/Obesity: Establish reasonable short term and long term weight goals.;Obesity: Provide education and appropriate resources to help participant work on and attain dietary goals.   Expected Outcomes Short Term: Continue to assess and modify interventions until short term weight is achieved;Long Term: Adherence to nutrition and physical activity/exercise program aimed toward attainment of established weight goal;Weight Loss: Understanding of general recommendations for a balanced deficit meal plan, which promotes 1-2 lb weight loss per week and includes a negative energy balance of [phone] kcal/d;Weight Maintenance: Understanding of the daily nutrition guidelines, which includes 25-35% calories from fat, 7% or less cal from saturated fats, less than 200mg  cholesterol, less than 1.5gm of sodium, & 5 or more servings of fruits and vegetables daily;Understanding recommendations for meals to include 15-35% energy as  protein, 25-35% energy from fat, 35-60% energy from carbohydrates, less than  200mg  of dietary cholesterol, 20-35 gm of total fiber daily;Understanding of distribution of calorie intake throughout the day with the consumption of 4-5 meals/snacks   Hypertension Yes   Intervention Provide education on lifestyle modifcations including regular physical activity/exercise, weight management, moderate sodium restriction and increased consumption of fresh fruit, vegetables, and low fat dairy, alcohol moderation, and smoking cessation.;Monitor prescription use compliance.   Expected Outcomes Short Term: Continued assessment and intervention until BP is < 140/13mm HG in hypertensive participants. < 130/71mm HG in hypertensive participants with diabetes, heart failure or chronic kidney disease.;Long Term: Maintenance of blood pressure at goal levels.   Lipids Yes   Intervention Provide education and support for participant on nutrition & aerobic/resistive exercise along with prescribed medications to achieve LDL 70mg , HDL >40mg .   Expected Outcomes Short Term: Participant states understanding of desired cholesterol values and is compliant with medications prescribed. Participant is following exercise prescription and nutrition guidelines.;Long Term: Cholesterol controlled with medications as prescribed, with individualized exercise RX and with personalized nutrition plan. Value goals: LDL < 70mg , HDL > 40 mg.      Core Components/Risk Factors/Patient Goals Review:    Core Components/Risk Factors/Patient Goals at Discharge (Final Review):    ITP Comments:     ITP Comments    Row Name 11/11/16 0932           ITP Comments Medical Director, Dr. Fransico Him          Comments: [place] attended orientation from 0800 to 1000 to review rules and guidelines for program. Completed 6 minute walk test, Intitial ITP, and exercise prescription.  VSS. Telemetry-Sinus Rhythm with occasional PVC's.   Asymptomatic.Barnet Pall, RN,BSN 11/11/2016 5:02 PM

## 2016-11-15 ENCOUNTER — Encounter (HOSPITAL_COMMUNITY)
Admission: RE | Admit: 2016-11-15 | Discharge: 2016-11-15 | Disposition: A | Discharge status: Home / Self Care | Payer: Federal, State, Local not specified - PPO | Source: Ambulatory Visit | Attending: Cardiovascular Disease | Admitting: Cardiovascular Disease

## 2016-11-15 ENCOUNTER — Encounter (HOSPITAL_COMMUNITY): Payer: Self-pay

## 2016-11-15 DIAGNOSIS — Z7982 Long term (current) use of aspirin: Secondary | ICD-10-CM | POA: Diagnosis not present

## 2016-11-15 DIAGNOSIS — F419 Anxiety disorder, unspecified: Secondary | ICD-10-CM | POA: Diagnosis not present

## 2016-11-15 DIAGNOSIS — K589 Irritable bowel syndrome without diarrhea: Secondary | ICD-10-CM | POA: Diagnosis not present

## 2016-11-15 DIAGNOSIS — E039 Hypothyroidism, unspecified: Secondary | ICD-10-CM | POA: Diagnosis not present

## 2016-11-15 DIAGNOSIS — Z79899 Other long term (current) drug therapy: Secondary | ICD-10-CM | POA: Diagnosis not present

## 2016-11-15 DIAGNOSIS — I208 Other forms of angina pectoris: Secondary | ICD-10-CM

## 2016-11-15 DIAGNOSIS — Z7902 Long term (current) use of antithrombotics/antiplatelets: Secondary | ICD-10-CM | POA: Diagnosis not present

## 2016-11-15 DIAGNOSIS — I25118 Atherosclerotic heart disease of native coronary artery with other forms of angina pectoris: Secondary | ICD-10-CM | POA: Diagnosis not present

## 2016-11-15 DIAGNOSIS — K219 Gastro-esophageal reflux disease without esophagitis: Secondary | ICD-10-CM | POA: Diagnosis not present

## 2016-11-15 DIAGNOSIS — I10 Essential (primary) hypertension: Secondary | ICD-10-CM | POA: Diagnosis not present

## 2016-11-15 DIAGNOSIS — I252 Old myocardial infarction: Secondary | ICD-10-CM | POA: Diagnosis not present

## 2016-11-15 DIAGNOSIS — E109 Type 1 diabetes mellitus without complications: Secondary | ICD-10-CM | POA: Diagnosis not present

## 2016-11-15 NOTE — Progress Notes (Signed)
Daily Session Note  Patient Details  Name: [name] MRN: [id number] Date of Birth: [birth year] Referring Provider:     CARDIAC REHAB PHASE II ORIENTATION from 11/11/2016 in [place]  Referring Provider  Quay Burow MD      Encounter Date: 11/15/2016  Check In:     Session Check In - 11/15/16 0832      Check-In   Location [facility]   Staff Present Cleda Mccreedy, MS, Exercise Physiologist;Molly diVincenzo, MS, ACSM RCEP, Exercise Physiologist;Marlane Hirschmann, RN, BSN   Supervising physician immediately available to respond to emergencies [facility] immediately available   Physician(s) Dr. Cathlean Sauer   Medication changes reported     No   Fall or balance concerns reported    No   Tobacco Cessation No Change   Warm-up and Cool-down Performed as group-led instruction   Resistance Training Performed Yes   VAD Patient? No     Pain Assessment   Currently in Pain? No/denies   Multiple Pain Sites No      Capillary Blood Glucose: No results found for this or any previous visit (from the past 24 hour(s)).    History  Smoking Status  . Never Smoker  Smokeless Tobacco  . Never Used    Goals Met:  Exercise tolerated well  Goals Unmet:  Not Applicable  Comments: Pt started cardiac rehab today.  Pt tolerated light exercise without difficulty. VSS, telemetry-sinus rhythm,  asymptomatic.  Medication list reconciled. Pt denies barriers to medicaiton compliance.  PSYCHOSOCIAL ASSESSMENT:  PHQ-0. Pt exhibits positive coping skills, hopeful outlook with supportive family. No psychosocial needs identified at this time, no psychosocial interventions necessary.    Pt enjoys traveling, reading and spending time with her grandchildren, ages 39 and 15.  Pt is also enjoying retirement from her career with [employer].   Pt goals for cardiac rehab are increase strength and stamina.   Pt oriented to exercise equipment and routine.     Understanding verbalized.   Dr. Fransico Him is Medical Director for Cardiac Rehab at [facility].

## 2016-11-17 ENCOUNTER — Encounter (HOSPITAL_COMMUNITY): Payer: Federal, State, Local not specified - PPO

## 2016-11-19 ENCOUNTER — Encounter (HOSPITAL_COMMUNITY): Payer: Federal, State, Local not specified - PPO

## 2016-11-22 ENCOUNTER — Encounter (HOSPITAL_COMMUNITY)
Admission: RE | Admit: 2016-11-22 | Discharge: 2016-11-22 | Disposition: A | Discharge status: Home / Self Care | Payer: Federal, State, Local not specified - PPO | Source: Ambulatory Visit | Attending: Cardiovascular Disease | Admitting: Cardiovascular Disease

## 2016-11-22 DIAGNOSIS — I10 Essential (primary) hypertension: Secondary | ICD-10-CM | POA: Diagnosis not present

## 2016-11-22 DIAGNOSIS — Z7902 Long term (current) use of antithrombotics/antiplatelets: Secondary | ICD-10-CM | POA: Diagnosis not present

## 2016-11-22 DIAGNOSIS — I208 Other forms of angina pectoris: Secondary | ICD-10-CM

## 2016-11-22 DIAGNOSIS — K219 Gastro-esophageal reflux disease without esophagitis: Secondary | ICD-10-CM | POA: Diagnosis not present

## 2016-11-22 DIAGNOSIS — Z79899 Other long term (current) drug therapy: Secondary | ICD-10-CM | POA: Diagnosis not present

## 2016-11-22 DIAGNOSIS — E039 Hypothyroidism, unspecified: Secondary | ICD-10-CM | POA: Diagnosis not present

## 2016-11-22 DIAGNOSIS — I252 Old myocardial infarction: Secondary | ICD-10-CM | POA: Diagnosis not present

## 2016-11-22 DIAGNOSIS — K589 Irritable bowel syndrome without diarrhea: Secondary | ICD-10-CM | POA: Diagnosis not present

## 2016-11-22 DIAGNOSIS — I25118 Atherosclerotic heart disease of native coronary artery with other forms of angina pectoris: Secondary | ICD-10-CM | POA: Diagnosis not present

## 2016-11-22 DIAGNOSIS — F419 Anxiety disorder, unspecified: Secondary | ICD-10-CM | POA: Diagnosis not present

## 2016-11-22 DIAGNOSIS — Z7982 Long term (current) use of aspirin: Secondary | ICD-10-CM | POA: Diagnosis not present

## 2016-11-22 DIAGNOSIS — E109 Type 1 diabetes mellitus without complications: Secondary | ICD-10-CM | POA: Diagnosis not present

## 2016-11-23 ENCOUNTER — Encounter: Payer: Self-pay | Admitting: Neurology

## 2016-11-24 ENCOUNTER — Encounter (HOSPITAL_COMMUNITY): Payer: Self-pay

## 2016-11-24 ENCOUNTER — Encounter (HOSPITAL_COMMUNITY)
Admission: RE | Admit: 2016-11-24 | Discharge: 2016-11-24 | Disposition: A | Discharge status: Home / Self Care | Payer: Federal, State, Local not specified - PPO | Source: Ambulatory Visit | Attending: Cardiovascular Disease | Admitting: Cardiovascular Disease

## 2016-11-24 DIAGNOSIS — Z79899 Other long term (current) drug therapy: Secondary | ICD-10-CM | POA: Diagnosis not present

## 2016-11-24 DIAGNOSIS — K219 Gastro-esophageal reflux disease without esophagitis: Secondary | ICD-10-CM | POA: Diagnosis not present

## 2016-11-24 DIAGNOSIS — F419 Anxiety disorder, unspecified: Secondary | ICD-10-CM | POA: Diagnosis not present

## 2016-11-24 DIAGNOSIS — E039 Hypothyroidism, unspecified: Secondary | ICD-10-CM | POA: Diagnosis not present

## 2016-11-24 DIAGNOSIS — I10 Essential (primary) hypertension: Secondary | ICD-10-CM | POA: Diagnosis not present

## 2016-11-24 DIAGNOSIS — I25118 Atherosclerotic heart disease of native coronary artery with other forms of angina pectoris: Secondary | ICD-10-CM | POA: Diagnosis not present

## 2016-11-24 DIAGNOSIS — I208 Other forms of angina pectoris: Secondary | ICD-10-CM

## 2016-11-24 DIAGNOSIS — Z7902 Long term (current) use of antithrombotics/antiplatelets: Secondary | ICD-10-CM | POA: Diagnosis not present

## 2016-11-24 DIAGNOSIS — Z7982 Long term (current) use of aspirin: Secondary | ICD-10-CM | POA: Diagnosis not present

## 2016-11-24 DIAGNOSIS — I252 Old myocardial infarction: Secondary | ICD-10-CM | POA: Diagnosis not present

## 2016-11-24 DIAGNOSIS — K589 Irritable bowel syndrome without diarrhea: Secondary | ICD-10-CM | POA: Diagnosis not present

## 2016-11-24 DIAGNOSIS — E109 Type 1 diabetes mellitus without complications: Secondary | ICD-10-CM | POA: Diagnosis not present

## 2016-11-24 DIAGNOSIS — I2089 Other forms of angina pectoris: Secondary | ICD-10-CM

## 2016-11-24 NOTE — Progress Notes (Signed)
Cardiac Individual Treatment Plan  Patient Details  Name: [name] MRN: [id number] Date of Birth: [birth year] Referring Provider:     CARDIAC REHAB PHASE II ORIENTATION from 11/11/2016 in [place]  Referring Provider  Quay Burow MD      Initial Encounter Date:    CARDIAC REHAB PHASE II ORIENTATION from 11/11/2016 in [place]  Date  11/11/16  Referring Provider  Quay Burow MD      Visit Diagnosis: Stable angina [facility])  Patient's Home Medications on Admission:  Current Outpatient Prescriptions:  .  ALPRAZolam (XANAX) 0.5 MG tablet, Take 0.5 mg by mouth daily. , Disp: , Rfl:  .  amLODipine (NORVASC) 5 MG tablet, Take 1 tablet (5 mg total) by mouth daily., Disp: 30 tablet, Rfl: 11 .  aspirin EC 81 MG EC tablet, Take 1 tablet (81 mg total) by mouth daily., Disp: , Rfl:  .  atorvastatin (LIPITOR) 40 MG tablet, Take 1 tablet (40 mg total) by mouth daily at 6 PM., Disp: 30 tablet, Rfl: 9 .  clopidogrel (PLAVIX) 75 MG tablet, TAKE 1 TABLET BY MOUTH ONCE A DAY WITH BREAKFAST, Disp: 30 tablet, Rfl: 10 .  estradiol (ESTRACE) 0.5 MG tablet, Take 1 mg by mouth daily. , Disp: , Rfl:  .  isosorbide mononitrate (IMDUR) 60 MG 24 hr tablet, TAKE 1 TABLET BY MOUTH ONCE A DAY, Disp: 30 tablet, Rfl: 11 .  lisinopril (PRINIVIL,ZESTRIL) 5 MG tablet, TAKE 1 TABLET BY MOUTH DAILY., Disp: 90 tablet, Rfl: 3 .  metoprolol tartrate (LOPRESSOR) 25 MG tablet, TAKE 1 TABLET BY MOUTH TWICE A DAY, Disp: 60 tablet, Rfl: 11 .  nitroGLYCERIN (NITROSTAT) 0.4 MG SL tablet, Place 1 tablet (0.4 mg total) under the tongue every 5 (five) minutes as needed for chest pain., Disp: 25 tablet, Rfl: 5 .  pantoprazole (PROTONIX) 40 MG tablet, TAKE 1 TABLET BY MOUTH TWICE A DAY, Disp: 60 tablet, Rfl: 10 .  RANEXA 1000 MG SR tablet, TAKE 1 TABLET BY MOUTH 2 TIMES DAILY., Disp: 180 tablet, Rfl: 2  Past Medical History: Past Medical History:   Diagnosis Date  . Angina effort ([place]) 05/22/2013  . Anxiety   . CAD (coronary artery disease), possible SCAD 12/14/2012  . Colon polyps   . Duodenitis   . Dyspnea on exertion    LEXISCAN, 09/30/2008 - normal, EKG negative for ischemia, no ECG changes  . Esophageal stricture   . Gastritis   . GERD (gastroesophageal reflux disease)   . Gout    "only from RX given after MI" (01/22/2013)  . H/O hiatal hernia   . History of esophageal stricture   . Hypertension    2D ECHO, 09/30/2008 - EF >55%, normal: RENAL DOPPLER, 03/21/2007 - normal duplex study  . Hypothyroidism   . IBS (irritable bowel syndrome)   . Ischemic colitis ([place])   . Lower GI bleeding    10/07/11 "I've had 3 episodes in the past year"  . Myocardial infarction [facility]) 12/10/2012   "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)  . Tubular adenoma polyp of rectum 2008    Tobacco Use: History  Smoking Status  . Never Smoker  Smokeless Tobacco  . Never Used    Labs: Recent Review Flowsheet Data    Labs for ITP Cardiac and Pulmonary Rehab Latest Ref Rng & Units 12/11/2012 04/15/2014 02/06/2015 02/07/2015 05/20/2015   Cholestrol 125 - 200 mg/dL 187 184 - 265(H) 298(H)   LDLCALC <130 mg/dL 103(H)  92 - 167(H) 204(H)   HDL >=46 mg/dL 52 63 - 60 61   Trlycerides <150 mg/dL 159(H) 145 - 192(H) 166(H)   Hemoglobin A1c 4.8 - 5.6 % - - 5.3 - -      Capillary Blood Glucose: Lab Results  Component Value Date   GLUCAP 97 12/12/2012   GLUCAP 91 12/12/2012   GLUCAP 81 10/03/2008     Exercise Target Goals:    Exercise Program Goal: Individual exercise prescription set with THRR, safety & activity barriers. Participant demonstrates ability to understand and report RPE using BORG scale, to self-measure pulse accurately, and to acknowledge the importance of the exercise prescription.  Exercise Prescription Goal: Starting with aerobic activity 30 plus minutes a day, 3 days per week for initial exercise prescription. Provide home  exercise prescription and guidelines that participant acknowledges understanding prior to discharge.  Activity Barriers & Risk Stratification:     Activity Barriers & Cardiac Risk Stratification - 11/11/16 0932      Activity Barriers & Cardiac Risk Stratification   Activity Barriers Deconditioning;Muscular Weakness   Cardiac Risk Stratification High      6 Minute Walk:     6 Minute Walk    Row Name 11/11/16 1239         6 Minute Walk   Phase Initial     Distance 1350 feet     Walk Time 6 minutes     # of Rest Breaks 0     MPH 2.6     METS 2.8     RPE 9     VO2 Peak 9.7     Symptoms No     Resting HR 72 bpm     Resting BP 118/82     Max Ex. HR 95 bpm     Max Ex. BP 138/84     2 Minute Post BP 102/80        Oxygen Initial Assessment:   Oxygen Re-Evaluation:   Oxygen Discharge (Final Oxygen Re-Evaluation):   Initial Exercise Prescription:     Initial Exercise Prescription - 11/11/16 1200      Date of Initial Exercise RX and Referring Provider   Date 11/11/16   Referring Provider Quay Burow MD     Treadmill   MPH 2   Grade 0   Minutes 10   METs 2.53     Bike   Level 0.6   Minutes 10   METs 2.13     NuStep   Level 1   Minutes 10   METs 1.8     Prescription Details   Frequency (times per week) 5   Duration Progress to 45 minutes of aerobic exercise without signs/symptoms of physical distress     Intensity   THRR 40-80% of Max Heartrate 64-128   Ratings of Perceived Exertion 11-13   Perceived Dyspnea 0-4     Progression   Progression Continue to progress workloads to maintain intensity without signs/symptoms of physical distress.     Resistance Training   Training Prescription Yes   Weight 2   Reps 10-15      Perform Capillary Blood Glucose checks as needed.  Exercise Prescription Changes:     Exercise Prescription Changes    Row Name 11/22/16 1600             Response to Exercise   Blood Pressure (Admit) 112/70        Blood Pressure (Exercise) 160/80       Blood Pressure (  Exit) 112/70       Heart Rate (Admit) 93 bpm       Heart Rate (Exercise) 100 bpm       Heart Rate (Exit) 91 bpm       Rating of Perceived Exertion (Exercise) 13       Symptoms none       Comments Pt is tolerating exercise in cardiac rehab very well; will continue to monitor pt's progress       Duration Continue with 30 min of aerobic exercise without signs/symptoms of physical distress.       Intensity THRR unchanged         Resistance Training   Training Prescription Yes       Weight 2       Reps 10-15       Time 10 Minutes         Treadmill   MPH 2.5       Grade 0       Minutes 10       METs 2.91         Bike   Level 0.6       Minutes 10       METs 2.13         NuStep   Level 3       Minutes 10       METs 2.4          Exercise Comments:     Exercise Comments    Row Name 11/23/16 1343           Exercise Comments Reviewed METs and activity levels with pt. Pt is tolerating exercise very well; will continue to monitor exercise progression.          Exercise Goals and Review:     Exercise Goals    Row Name 11/11/16 0936 11/23/16 1340           Exercise Goals   Increase Physical Activity (P)  Yes  Build confidence with functional acitivites and ADL's Yes      Intervention (P)  Provide advice, education, support and counseling about physical activity/exercise needs.;Develop an individualized exercise prescription for aerobic and resistive training based on initial evaluation findings, risk stratification, comorbidities and participant's personal goals. Provide advice, education, support and counseling about physical activity/exercise needs.;Develop an individualized exercise prescription for aerobic and resistive training based on initial evaluation findings, risk stratification, comorbidities and participant's personal goals.      Expected Outcomes (P)  Achievement of increased cardiorespiratory  fitness and enhanced flexibility, muscular endurance and strength shown through measurements of functional capacity and personal statement of participant. Achievement of increased cardiorespiratory fitness and enhanced flexibility, muscular endurance and strength shown through measurements of functional capacity and personal statement of participant.      Increase Strength and Stamina (P)  Yes  Be able to climb stairs and uphill without fatigue and SOB Yes      Intervention (P)  Provide advice, education, support and counseling about physical activity/exercise needs.;Develop an individualized exercise prescription for aerobic and resistive training based on initial evaluation findings, risk stratification, comorbidities and participant's personal goals. Provide advice, education, support and counseling about physical activity/exercise needs.;Develop an individualized exercise prescription for aerobic and resistive training based on initial evaluation findings, risk stratification, comorbidities and participant's personal goals.      Expected Outcomes (P)  Achievement of increased cardiorespiratory fitness and enhanced flexibility, muscular endurance and strength shown through measurements of functional capacity  and personal statement of participant. Achievement of increased cardiorespiratory fitness and enhanced flexibility, muscular endurance and strength shown through measurements of functional capacity and personal statement of participant.         Exercise Goals Re-Evaluation :     Exercise Goals Re-Evaluation    Row Name 11/23/16 1340             Exercise Goal Re-Evaluation   Exercise Goals Review Increase Physical Activity;Increase Strenth and Stamina       Comments Pt is tolerating exercise very well in cardiac rehab.       Expected Outcomes Pt will continue to come 3x/week to cardiac rehab to work on increasing cardiorespiratory fitness           Discharge Exercise Prescription  (Final Exercise Prescription Changes):     Exercise Prescription Changes - 11/22/16 1600      Response to Exercise   Blood Pressure (Admit) 112/70   Blood Pressure (Exercise) 160/80   Blood Pressure (Exit) 112/70   Heart Rate (Admit) 93 bpm   Heart Rate (Exercise) 100 bpm   Heart Rate (Exit) 91 bpm   Rating of Perceived Exertion (Exercise) 13   Symptoms none   Comments Pt is tolerating exercise in cardiac rehab very well; will continue to monitor pt's progress   Duration Continue with 30 min of aerobic exercise without signs/symptoms of physical distress.   Intensity THRR unchanged     Resistance Training   Training Prescription Yes   Weight 2   Reps 10-15   Time 10 Minutes     Treadmill   MPH 2.5   Grade 0   Minutes 10   METs 2.91     Bike   Level 0.6   Minutes 10   METs 2.13     NuStep   Level 3   Minutes 10   METs 2.4      Nutrition:  Target Goals: Understanding of nutrition guidelines, daily intake of sodium 1500mg , cholesterol 200mg , calories 30% from fat and 7% or less from saturated fats, daily to have 5 or more servings of fruits and vegetables.  Biometrics:     Pre Biometrics - 11/11/16 1245      Pre Biometrics   Waist Circumference 40.25 inches   Hip Circumference 52 inches   Waist to Hip Ratio 0.77 %   Triceps Skinfold 52 mm   % Body Fat 51.1 %   Grip Strength 35 kg   Flexibility 10 in   Single Leg Stand 8.03 seconds       Nutrition Therapy Plan and Nutrition Goals:   Nutrition Discharge: Nutrition Scores:   Nutrition Goals Re-Evaluation:   Nutrition Goals Re-Evaluation:   Nutrition Goals Discharge (Final Nutrition Goals Re-Evaluation):   Psychosocial: Target Goals: Acknowledge presence or absence of significant depression and/or stress, maximize coping skills, provide positive support system. Participant is able to verbalize types and ability to use techniques and skills needed for reducing stress and depression.  Initial  Review & Psychosocial Screening:     Initial Psych Review & Screening - 11/15/16 1105      Initial Review   Current issues with Current Stress Concerns   Source of Stress Concerns Chronic Illness;Unable to participate in former interests or hobbies     [place]? Yes     Barriers   Psychosocial barriers to participate in program There are no identifiable barriers or psychosocial needs.     Screening Interventions  Interventions Encouraged to exercise      Quality of Life Scores:     Quality of Life - 11/11/16 1245      Quality of Life Scores   Health/Function Pre 18.96 %   Socioeconomic Pre 27.83 %   Psych/Spiritual Pre 14.57 %   Family Pre 22.5 %   GLOBAL Pre 20.15 %      PHQ-9: Recent Review Flowsheet Data    Depression screen [facility] 2/9 11/15/2016   Decreased Interest 0   Down, Depressed, Hopeless 0   PHQ - 2 Score 0     Interpretation of Total Score  Total Score Depression Severity:  1-4 = Minimal depression, 5-9 = Mild depression, 10-14 = Moderate depression, 15-19 = Moderately severe depression, 20-27 = Severe depression   Psychosocial Evaluation and Intervention:     Psychosocial Evaluation - 11/15/16 1107      Psychosocial Evaluation & Interventions   Interventions Encouraged to exercise with the program and follow exercise prescription;Stress management education;Relaxation education   Comments health related anxiety    Expected Outcomes pt will demonstrate positive outlook wtih good coping skills.    Continue Psychosocial Services  No Follow up required      Psychosocial Re-Evaluation:     Psychosocial Re-Evaluation    [place] Name 11/24/16 1713             Psychosocial Re-Evaluation   Current issues with Current Anxiety/Panic       Comments pt with health related anxiety is tolerating CR participation without difficulty.  exhibits positive interactions with staff and class mates.        Expected Outcomes pt will  exhibit positive outlook with good coping skills.        Interventions Encouraged to attend Cardiac Rehabilitation for the exercise;Stress management education;Relaxation education       Continue Psychosocial Services  Follow up required by staff          Psychosocial Discharge (Final Psychosocial Re-Evaluation):     Psychosocial Re-Evaluation - 11/24/16 1713      Psychosocial Re-Evaluation   Current issues with Current Anxiety/Panic   Comments pt with health related anxiety is tolerating CR participation without difficulty.  exhibits positive interactions with staff and class mates.    Expected Outcomes pt will exhibit positive outlook with good coping skills.    Interventions Encouraged to attend Cardiac Rehabilitation for the exercise;Stress management education;Relaxation education   Continue Psychosocial Services  Follow up required by staff      Vocational Rehabilitation: Provide vocational rehab assistance to qualifying candidates.   Vocational Rehab Evaluation & Intervention:     Vocational Rehab - 11/11/16 1658      Initial Vocational Rehab Evaluation & Intervention   Assessment shows need for Vocational Rehabilitation No      Education: Education Goals: Education classes will be provided on a weekly basis, covering required topics. Participant will state understanding/return demonstration of topics presented.  Learning Barriers/Preferences:     Learning Barriers/Preferences - 11/11/16 0935      Learning Barriers/Preferences   Learning Barriers Sight   Learning Preferences Written Material;Skilled Demonstration      Education Topics: Count Your Pulse:  -Group instruction provided by verbal instruction, demonstration, patient participation and written materials to support subject.  Instructors address importance of being able to find your pulse and how to count your pulse when at home without a heart monitor.  Patients get hands on experience counting their  pulse with staff help and individually.  Heart Attack, Angina, and Risk Factor Modification:  -Group instruction provided by verbal instruction, video, and written materials to support subject.  Instructors address signs and symptoms of angina and heart attacks.    Also discuss risk factors for heart disease and how to make changes to improve heart health risk factors.   Functional Fitness:  -Group instruction provided by verbal instruction, demonstration, patient participation, and written materials to support subject.  Instructors address safety measures for doing things around the house.  Discuss how to get up and down off the floor, how to pick things up properly, how to safely get out of a chair without assistance, and balance training.   Meditation and Mindfulness:  -Group instruction provided by verbal instruction, patient participation, and written materials to support subject.  Instructor addresses importance of mindfulness and meditation practice to help reduce stress and improve awareness.  Instructor also leads participants through a meditation exercise.    Stretching for Flexibility and Mobility:  -Group instruction provided by verbal instruction, patient participation, and written materials to support subject.  Instructors lead participants through series of stretches that are designed to increase flexibility thus improving mobility.  These stretches are additional exercise for major muscle groups that are typically performed during regular warm up and cool down.   Hands Only CPR Anytime:  -Group instruction provided by verbal instruction, video, patient participation and written materials to support subject.  Instructors co-teach with AHA video for hands only CPR.  Participants get hands on experience with mannequins.   Nutrition I class: Heart Healthy Eating:  -Group instruction provided by PowerPoint slides, verbal discussion, and written materials to support subject matter.  The instructor gives an explanation and review of the Therapeutic Lifestyle Changes diet recommendations, which includes a discussion on lipid goals, dietary fat, sodium, fiber, plant stanol/sterol esters, sugar, and the components of a well-balanced, healthy diet.   Nutrition II class: Lifestyle Skills:  -Group instruction provided by PowerPoint slides, verbal discussion, and written materials to support subject matter. The instructor gives an explanation and review of label reading, grocery shopping for heart health, heart healthy recipe modifications, and ways to make healthier choices when eating out.   Diabetes Question & Answer:  -Group instruction provided by PowerPoint slides, verbal discussion, and written materials to support subject matter. The instructor gives an explanation and review of diabetes co-morbidities, pre- and post-prandial blood glucose goals, pre-exercise blood glucose goals, signs, symptoms, and treatment of hypoglycemia and hyperglycemia, and foot care basics.   Diabetes Blitz:  -Group instruction provided by PowerPoint slides, verbal discussion, and written materials to support subject matter. The instructor gives an explanation and review of the physiology behind type 1 and type 2 diabetes, diabetes medications and rational behind using different medications, pre- and post-prandial blood glucose recommendations and Hemoglobin A1c goals, diabetes diet, and exercise including blood glucose guidelines for exercising safely.    Portion Distortion:  -Group instruction provided by PowerPoint slides, verbal discussion, written materials, and food models to support subject matter. The instructor gives an explanation of serving size versus portion size, changes in portions sizes over the last 20 years, and what consists of a serving from each food group.   Stress Management:  -Group instruction provided by verbal instruction, video, and written materials to support subject  matter.  Instructors review role of stress in heart disease and how to cope with stress positively.     Exercising on Your Own:  -Group instruction provided by verbal instruction, power point, and  written materials to support subject.  Instructors discuss benefits of exercise, components of exercise, frequency and intensity of exercise, and end points for exercise.  Also discuss use of nitroglycerin and activating EMS.  Review options of places to exercise outside of rehab.  Review guidelines for sex with heart disease.   Cardiac Drugs I:  -Group instruction provided by verbal instruction and written materials to support subject.  Instructor reviews cardiac drug classes: antiplatelets, anticoagulants, beta blockers, and statins.  Instructor discusses reasons, side effects, and lifestyle considerations for each drug class.   Cardiac Drugs II:  -Group instruction provided by verbal instruction and written materials to support subject.  Instructor reviews cardiac drug classes: angiotensin converting enzyme inhibitors (ACE-I), angiotensin II receptor blockers (ARBs), nitrates, and calcium channel blockers.  Instructor discusses reasons, side effects, and lifestyle considerations for each drug class.   Anatomy and Physiology of the Circulatory System:  -Group instruction provided by verbal instruction, video, and written materials to support subject.  Reviews functional anatomy of heart, how it relates to various diagnoses, and what role the heart plays in the overall system.   Knowledge Questionnaire Score:     Knowledge Questionnaire Score - 11/11/16 1238      Knowledge Questionnaire Score   Pre Score 18/24      Core Components/Risk Factors/Patient Goals at Admission:     Personal Goals and Risk Factors at Admission - 11/11/16 1654      Core Components/Risk Factors/Patient Goals on Admission    Weight Management Yes   Intervention Weight Management: Develop a combined nutrition and  exercise program designed to reach desired caloric intake, while maintaining appropriate intake of nutrient and fiber, sodium and fats, and appropriate energy expenditure required for the weight goal.;Weight Management: Provide education and appropriate resources to help participant work on and attain dietary goals.;Weight Management/Obesity: Establish reasonable short term and long term weight goals.;Obesity: Provide education and appropriate resources to help participant work on and attain dietary goals.   Expected Outcomes Short Term: Continue to assess and modify interventions until short term weight is achieved;Long Term: Adherence to nutrition and physical activity/exercise program aimed toward attainment of established weight goal;Weight Loss: Understanding of general recommendations for a balanced deficit meal plan, which promotes 1-2 lb weight loss per week and includes a negative energy balance of [id number] kcal/d;Weight Maintenance: Understanding of the daily nutrition guidelines, which includes 25-35% calories from fat, 7% or less cal from saturated fats, less than 200mg  cholesterol, less than 1.5gm of sodium, & 5 or more servings of fruits and vegetables daily;Understanding recommendations for meals to include 15-35% energy as protein, 25-35% energy from fat, 35-60% energy from carbohydrates, less than 200mg  of dietary cholesterol, 20-35 gm of total fiber daily;Understanding of distribution of calorie intake throughout the day with the consumption of 4-5 meals/snacks   Hypertension Yes   Intervention Provide education on lifestyle modifcations including regular physical activity/exercise, weight management, moderate sodium restriction and increased consumption of fresh fruit, vegetables, and low fat dairy, alcohol moderation, and smoking cessation.;Monitor prescription use compliance.   Expected Outcomes Short Term: Continued assessment and intervention until BP is < 140/34mm HG in hypertensive  participants. < 130/47mm HG in hypertensive participants with diabetes, heart failure or chronic kidney disease.;Long Term: Maintenance of blood pressure at goal levels.   Lipids Yes   Intervention Provide education and support for participant on nutrition & aerobic/resistive exercise along with prescribed medications to achieve LDL 70mg , HDL >40mg .   Expected Outcomes Short Term: Participant states  understanding of desired cholesterol values and is compliant with medications prescribed. Participant is following exercise prescription and nutrition guidelines.;Long Term: Cholesterol controlled with medications as prescribed, with individualized exercise RX and with personalized nutrition plan. Value goals: LDL < 70mg , HDL > 40 mg.      Core Components/Risk Factors/Patient Goals Review:      Goals and Risk Factor Review    Row Name 11/24/16 1711             Core Components/Risk Factors/Patient Goals Review   Personal Goals Review Weight Management/Obesity;Lipids;Hypertension       Review pt exhibits positive outlook about CR participation.        Expected Outcomes pt will participate in cardiac rehab exercise, nutrition and lifestyle modification education classes to decrease overall CAD risk factors.           Core Components/Risk Factors/Patient Goals at Discharge (Final Review):      Goals and Risk Factor Review - 11/24/16 1711      Core Components/Risk Factors/Patient Goals Review   Personal Goals Review Weight Management/Obesity;Lipids;Hypertension   Review pt exhibits positive outlook about CR participation.    Expected Outcomes pt will participate in cardiac rehab exercise, nutrition and lifestyle modification education classes to decrease overall CAD risk factors.       ITP Comments:     ITP Comments    Row Name 11/11/16 0932           ITP Comments Medical Director, Dr. Fransico Him          Comments: Pt is making expected progress toward personal goals after  completing 3 sessions. Recommend continued exercise and life style modification education including  stress management and relaxation techniques to decrease cardiac risk profile.

## 2016-11-26 ENCOUNTER — Encounter (HOSPITAL_COMMUNITY): Payer: Federal, State, Local not specified - PPO

## 2016-11-29 ENCOUNTER — Encounter (HOSPITAL_COMMUNITY)
Admission: RE | Admit: 2016-11-29 | Discharge: 2016-11-29 | Disposition: A | Discharge status: Home / Self Care | Payer: Federal, State, Local not specified - PPO | Source: Ambulatory Visit | Attending: Cardiovascular Disease | Admitting: Cardiovascular Disease

## 2016-11-29 DIAGNOSIS — I25118 Atherosclerotic heart disease of native coronary artery with other forms of angina pectoris: Secondary | ICD-10-CM | POA: Diagnosis not present

## 2016-11-29 DIAGNOSIS — K219 Gastro-esophageal reflux disease without esophagitis: Secondary | ICD-10-CM | POA: Diagnosis not present

## 2016-11-29 DIAGNOSIS — I208 Other forms of angina pectoris: Secondary | ICD-10-CM

## 2016-11-29 DIAGNOSIS — Z7982 Long term (current) use of aspirin: Secondary | ICD-10-CM | POA: Diagnosis not present

## 2016-11-29 DIAGNOSIS — I252 Old myocardial infarction: Secondary | ICD-10-CM | POA: Diagnosis not present

## 2016-11-29 DIAGNOSIS — Z79899 Other long term (current) drug therapy: Secondary | ICD-10-CM | POA: Diagnosis not present

## 2016-11-29 DIAGNOSIS — F419 Anxiety disorder, unspecified: Secondary | ICD-10-CM | POA: Diagnosis not present

## 2016-11-29 DIAGNOSIS — Z7902 Long term (current) use of antithrombotics/antiplatelets: Secondary | ICD-10-CM | POA: Diagnosis not present

## 2016-11-29 DIAGNOSIS — I10 Essential (primary) hypertension: Secondary | ICD-10-CM | POA: Diagnosis not present

## 2016-11-29 DIAGNOSIS — K589 Irritable bowel syndrome without diarrhea: Secondary | ICD-10-CM | POA: Diagnosis not present

## 2016-11-29 DIAGNOSIS — E109 Type 1 diabetes mellitus without complications: Secondary | ICD-10-CM | POA: Diagnosis not present

## 2016-11-29 DIAGNOSIS — E039 Hypothyroidism, unspecified: Secondary | ICD-10-CM | POA: Diagnosis not present

## 2016-11-29 NOTE — Progress Notes (Signed)
Reviewed home exercise with pt today.  Pt plans to go the [facility] for exercise and use exercise equipment 2x/week in addition to coming to cardiac rehab.  Reviewed THR, pulse, RPE, sign and symptoms, NTG use, and when to call 911 or MD.  Also discussed weather considerations and indoor options.  Pt voiced understanding.    [name] [employer]

## 2016-12-01 ENCOUNTER — Encounter (HOSPITAL_COMMUNITY)
Admission: RE | Admit: 2016-12-01 | Discharge: 2016-12-01 | Disposition: A | Discharge status: Home / Self Care | Payer: Federal, State, Local not specified - PPO | Source: Ambulatory Visit | Attending: Cardiovascular Disease | Admitting: Cardiovascular Disease

## 2016-12-01 DIAGNOSIS — Z7982 Long term (current) use of aspirin: Secondary | ICD-10-CM | POA: Diagnosis not present

## 2016-12-01 DIAGNOSIS — Z79899 Other long term (current) drug therapy: Secondary | ICD-10-CM | POA: Insufficient documentation

## 2016-12-01 DIAGNOSIS — I25118 Atherosclerotic heart disease of native coronary artery with other forms of angina pectoris: Secondary | ICD-10-CM | POA: Insufficient documentation

## 2016-12-01 DIAGNOSIS — K219 Gastro-esophageal reflux disease without esophagitis: Secondary | ICD-10-CM | POA: Insufficient documentation

## 2016-12-01 DIAGNOSIS — I252 Old myocardial infarction: Secondary | ICD-10-CM | POA: Diagnosis not present

## 2016-12-01 DIAGNOSIS — E039 Hypothyroidism, unspecified: Secondary | ICD-10-CM | POA: Diagnosis not present

## 2016-12-01 DIAGNOSIS — K589 Irritable bowel syndrome without diarrhea: Secondary | ICD-10-CM | POA: Insufficient documentation

## 2016-12-01 DIAGNOSIS — Z7902 Long term (current) use of antithrombotics/antiplatelets: Secondary | ICD-10-CM | POA: Insufficient documentation

## 2016-12-01 DIAGNOSIS — I208 Other forms of angina pectoris: Secondary | ICD-10-CM

## 2016-12-01 DIAGNOSIS — E109 Type 1 diabetes mellitus without complications: Secondary | ICD-10-CM | POA: Insufficient documentation

## 2016-12-01 DIAGNOSIS — F419 Anxiety disorder, unspecified: Secondary | ICD-10-CM | POA: Insufficient documentation

## 2016-12-01 DIAGNOSIS — I10 Essential (primary) hypertension: Secondary | ICD-10-CM | POA: Diagnosis not present

## 2016-12-03 ENCOUNTER — Encounter (HOSPITAL_COMMUNITY): Payer: Federal, State, Local not specified - PPO

## 2016-12-06 ENCOUNTER — Encounter (HOSPITAL_COMMUNITY)
Admission: RE | Admit: 2016-12-06 | Discharge: 2016-12-06 | Disposition: A | Discharge status: Home / Self Care | Payer: Federal, State, Local not specified - PPO | Source: Ambulatory Visit | Attending: Cardiovascular Disease | Admitting: Cardiovascular Disease

## 2016-12-06 DIAGNOSIS — I25118 Atherosclerotic heart disease of native coronary artery with other forms of angina pectoris: Secondary | ICD-10-CM | POA: Diagnosis not present

## 2016-12-06 DIAGNOSIS — K589 Irritable bowel syndrome without diarrhea: Secondary | ICD-10-CM | POA: Diagnosis not present

## 2016-12-06 DIAGNOSIS — Z79899 Other long term (current) drug therapy: Secondary | ICD-10-CM | POA: Diagnosis not present

## 2016-12-06 DIAGNOSIS — K219 Gastro-esophageal reflux disease without esophagitis: Secondary | ICD-10-CM | POA: Diagnosis not present

## 2016-12-06 DIAGNOSIS — Z7902 Long term (current) use of antithrombotics/antiplatelets: Secondary | ICD-10-CM | POA: Diagnosis not present

## 2016-12-06 DIAGNOSIS — F419 Anxiety disorder, unspecified: Secondary | ICD-10-CM | POA: Diagnosis not present

## 2016-12-06 DIAGNOSIS — E039 Hypothyroidism, unspecified: Secondary | ICD-10-CM | POA: Diagnosis not present

## 2016-12-06 DIAGNOSIS — Z7982 Long term (current) use of aspirin: Secondary | ICD-10-CM | POA: Diagnosis not present

## 2016-12-06 DIAGNOSIS — I252 Old myocardial infarction: Secondary | ICD-10-CM | POA: Diagnosis not present

## 2016-12-06 DIAGNOSIS — I208 Other forms of angina pectoris: Secondary | ICD-10-CM

## 2016-12-06 DIAGNOSIS — I10 Essential (primary) hypertension: Secondary | ICD-10-CM | POA: Diagnosis not present

## 2016-12-06 DIAGNOSIS — E109 Type 1 diabetes mellitus without complications: Secondary | ICD-10-CM | POA: Diagnosis not present

## 2016-12-06 NOTE — Progress Notes (Signed)
[name] 61 y.o. female Nutrition Note Spoke with pt.  Nutrition Survey reviewed with pt. Pt is following Step 2 of the Therapeutic Lifestyle Changes diet. Pt wants to lose wt. Pt has been trying to lose wt by decreasing portion sizes consumed. Per discussion, pt is not interested in keeping a food journal or counting calories. Pt has not previously participated in any formal wt loss diets "I just thought if I ate better, I'd lose wt." Activity level is limited to MWF and 15 minutes on "the other days due to the doctor is concerned about my heart rate." Wt loss tips discussed. Pt has actively tried to decrease meals eaten out. Pt expressed understanding of the information reviewed. Pt aware of nutrition education classes offered and plans on attending nutrition classes. Lab Results  Component Value Date   HGBA1C 5.3 02/06/2015   Wt Readings from Last 3 Encounters:  11/11/16 222 lb 3.6 oz (100.8 kg)  09/30/16 218 lb (98.9 kg)  09/23/16 221 lb 9.6 oz (100.5 kg)   Nutrition Diagnosis ? Food-and nutrition-related knowledge deficit related to lack of exposure to information as related to diagnosis of: ? CVD ? Obesity related to excessive energy intake as evidenced by a BMI of 40.7 Nutrition Intervention ? Benefits of adopting Therapeutic Lifestyle Changes discussed when Medficts reviewed. ? Pt to attend the Portion Distortion class ? Pt to attend the  ? Nutrition I class                      ? Nutrition II class ? Pt given handouts for: ? 1500 kcal, 5 day menu idea ? Continue client-centered nutrition education by RD, as part of interdisciplinary care.  Goal(s) ? Pt to identify food quantities necessary to achieve weight loss of 6-24 lb (2.7-10.9 kg) at graduation from cardiac rehab.   Monitor and Evaluate progress toward nutrition goal with team.  Derek Mound, M.Ed, RD, LDN, CDE 12/06/2016 9:50 AM

## 2016-12-08 ENCOUNTER — Encounter (HOSPITAL_COMMUNITY)
Admission: RE | Admit: 2016-12-08 | Discharge: 2016-12-08 | Disposition: A | Discharge status: Home / Self Care | Payer: Federal, State, Local not specified - PPO | Source: Ambulatory Visit | Attending: Cardiovascular Disease | Admitting: Cardiovascular Disease

## 2016-12-08 DIAGNOSIS — I252 Old myocardial infarction: Secondary | ICD-10-CM | POA: Diagnosis not present

## 2016-12-08 DIAGNOSIS — I10 Essential (primary) hypertension: Secondary | ICD-10-CM | POA: Diagnosis not present

## 2016-12-08 DIAGNOSIS — Z79899 Other long term (current) drug therapy: Secondary | ICD-10-CM | POA: Diagnosis not present

## 2016-12-08 DIAGNOSIS — K219 Gastro-esophageal reflux disease without esophagitis: Secondary | ICD-10-CM | POA: Diagnosis not present

## 2016-12-08 DIAGNOSIS — Z7902 Long term (current) use of antithrombotics/antiplatelets: Secondary | ICD-10-CM | POA: Diagnosis not present

## 2016-12-08 DIAGNOSIS — I25118 Atherosclerotic heart disease of native coronary artery with other forms of angina pectoris: Secondary | ICD-10-CM | POA: Diagnosis not present

## 2016-12-08 DIAGNOSIS — I208 Other forms of angina pectoris: Secondary | ICD-10-CM

## 2016-12-08 DIAGNOSIS — E039 Hypothyroidism, unspecified: Secondary | ICD-10-CM | POA: Diagnosis not present

## 2016-12-08 DIAGNOSIS — E109 Type 1 diabetes mellitus without complications: Secondary | ICD-10-CM | POA: Diagnosis not present

## 2016-12-08 DIAGNOSIS — K589 Irritable bowel syndrome without diarrhea: Secondary | ICD-10-CM | POA: Diagnosis not present

## 2016-12-08 DIAGNOSIS — Z7982 Long term (current) use of aspirin: Secondary | ICD-10-CM | POA: Diagnosis not present

## 2016-12-08 DIAGNOSIS — F419 Anxiety disorder, unspecified: Secondary | ICD-10-CM | POA: Diagnosis not present

## 2016-12-10 ENCOUNTER — Encounter (HOSPITAL_COMMUNITY): Payer: Federal, State, Local not specified - PPO

## 2016-12-13 ENCOUNTER — Telehealth (HOSPITAL_COMMUNITY): Payer: Self-pay | Admitting: Family Medicine

## 2016-12-13 ENCOUNTER — Encounter (HOSPITAL_COMMUNITY): Payer: Federal, State, Local not specified - PPO

## 2016-12-15 ENCOUNTER — Encounter (HOSPITAL_COMMUNITY)
Admission: RE | Admit: 2016-12-15 | Discharge: 2016-12-15 | Disposition: A | Discharge status: Home / Self Care | Payer: Federal, State, Local not specified - PPO | Source: Ambulatory Visit | Attending: Cardiovascular Disease | Admitting: Cardiovascular Disease

## 2016-12-15 DIAGNOSIS — I10 Essential (primary) hypertension: Secondary | ICD-10-CM | POA: Diagnosis not present

## 2016-12-15 DIAGNOSIS — E039 Hypothyroidism, unspecified: Secondary | ICD-10-CM | POA: Diagnosis not present

## 2016-12-15 DIAGNOSIS — E109 Type 1 diabetes mellitus without complications: Secondary | ICD-10-CM | POA: Diagnosis not present

## 2016-12-15 DIAGNOSIS — I252 Old myocardial infarction: Secondary | ICD-10-CM | POA: Diagnosis not present

## 2016-12-15 DIAGNOSIS — Z7982 Long term (current) use of aspirin: Secondary | ICD-10-CM | POA: Diagnosis not present

## 2016-12-15 DIAGNOSIS — Z7902 Long term (current) use of antithrombotics/antiplatelets: Secondary | ICD-10-CM | POA: Diagnosis not present

## 2016-12-15 DIAGNOSIS — I208 Other forms of angina pectoris: Secondary | ICD-10-CM

## 2016-12-15 DIAGNOSIS — Z79899 Other long term (current) drug therapy: Secondary | ICD-10-CM | POA: Diagnosis not present

## 2016-12-15 DIAGNOSIS — K219 Gastro-esophageal reflux disease without esophagitis: Secondary | ICD-10-CM | POA: Diagnosis not present

## 2016-12-15 DIAGNOSIS — I25118 Atherosclerotic heart disease of native coronary artery with other forms of angina pectoris: Secondary | ICD-10-CM | POA: Diagnosis not present

## 2016-12-15 DIAGNOSIS — F419 Anxiety disorder, unspecified: Secondary | ICD-10-CM | POA: Diagnosis not present

## 2016-12-15 DIAGNOSIS — K589 Irritable bowel syndrome without diarrhea: Secondary | ICD-10-CM | POA: Diagnosis not present

## 2016-12-17 ENCOUNTER — Encounter (HOSPITAL_COMMUNITY): Payer: Federal, State, Local not specified - PPO

## 2016-12-20 ENCOUNTER — Encounter (HOSPITAL_COMMUNITY)
Admission: RE | Admit: 2016-12-20 | Discharge: 2016-12-20 | Disposition: A | Discharge status: Home / Self Care | Payer: Federal, State, Local not specified - PPO | Source: Ambulatory Visit | Attending: Cardiovascular Disease | Admitting: Cardiovascular Disease

## 2016-12-20 DIAGNOSIS — I25118 Atherosclerotic heart disease of native coronary artery with other forms of angina pectoris: Secondary | ICD-10-CM | POA: Diagnosis not present

## 2016-12-20 DIAGNOSIS — K219 Gastro-esophageal reflux disease without esophagitis: Secondary | ICD-10-CM | POA: Diagnosis not present

## 2016-12-20 DIAGNOSIS — E039 Hypothyroidism, unspecified: Secondary | ICD-10-CM | POA: Diagnosis not present

## 2016-12-20 DIAGNOSIS — Z79899 Other long term (current) drug therapy: Secondary | ICD-10-CM | POA: Diagnosis not present

## 2016-12-20 DIAGNOSIS — I208 Other forms of angina pectoris: Secondary | ICD-10-CM

## 2016-12-20 DIAGNOSIS — I252 Old myocardial infarction: Secondary | ICD-10-CM | POA: Diagnosis not present

## 2016-12-20 DIAGNOSIS — F419 Anxiety disorder, unspecified: Secondary | ICD-10-CM | POA: Diagnosis not present

## 2016-12-20 DIAGNOSIS — Z7982 Long term (current) use of aspirin: Secondary | ICD-10-CM | POA: Diagnosis not present

## 2016-12-20 DIAGNOSIS — E109 Type 1 diabetes mellitus without complications: Secondary | ICD-10-CM | POA: Diagnosis not present

## 2016-12-20 DIAGNOSIS — K589 Irritable bowel syndrome without diarrhea: Secondary | ICD-10-CM | POA: Diagnosis not present

## 2016-12-20 DIAGNOSIS — Z7902 Long term (current) use of antithrombotics/antiplatelets: Secondary | ICD-10-CM | POA: Diagnosis not present

## 2016-12-20 DIAGNOSIS — I10 Essential (primary) hypertension: Secondary | ICD-10-CM | POA: Diagnosis not present

## 2016-12-21 DIAGNOSIS — Z1231 Encounter for screening mammogram for malignant neoplasm of breast: Secondary | ICD-10-CM | POA: Diagnosis not present

## 2016-12-21 DIAGNOSIS — Z01419 Encounter for gynecological examination (general) (routine) without abnormal findings: Secondary | ICD-10-CM | POA: Diagnosis not present

## 2016-12-21 DIAGNOSIS — Z13820 Encounter for screening for osteoporosis: Secondary | ICD-10-CM | POA: Diagnosis not present

## 2016-12-21 DIAGNOSIS — Z6841 Body Mass Index (BMI) 40.0 and over, adult: Secondary | ICD-10-CM | POA: Diagnosis not present

## 2016-12-22 ENCOUNTER — Encounter (HOSPITAL_COMMUNITY)
Admission: RE | Admit: 2016-12-22 | Discharge: 2016-12-22 | Disposition: A | Discharge status: Home / Self Care | Payer: Federal, State, Local not specified - PPO | Source: Ambulatory Visit | Attending: Cardiovascular Disease | Admitting: Cardiovascular Disease

## 2016-12-22 DIAGNOSIS — E109 Type 1 diabetes mellitus without complications: Secondary | ICD-10-CM | POA: Diagnosis not present

## 2016-12-22 DIAGNOSIS — Z7982 Long term (current) use of aspirin: Secondary | ICD-10-CM | POA: Diagnosis not present

## 2016-12-22 DIAGNOSIS — E039 Hypothyroidism, unspecified: Secondary | ICD-10-CM | POA: Diagnosis not present

## 2016-12-22 DIAGNOSIS — Z79899 Other long term (current) drug therapy: Secondary | ICD-10-CM | POA: Diagnosis not present

## 2016-12-22 DIAGNOSIS — I10 Essential (primary) hypertension: Secondary | ICD-10-CM | POA: Diagnosis not present

## 2016-12-22 DIAGNOSIS — I208 Other forms of angina pectoris: Secondary | ICD-10-CM

## 2016-12-22 DIAGNOSIS — I25118 Atherosclerotic heart disease of native coronary artery with other forms of angina pectoris: Secondary | ICD-10-CM | POA: Diagnosis not present

## 2016-12-22 DIAGNOSIS — K219 Gastro-esophageal reflux disease without esophagitis: Secondary | ICD-10-CM | POA: Diagnosis not present

## 2016-12-22 DIAGNOSIS — Z7902 Long term (current) use of antithrombotics/antiplatelets: Secondary | ICD-10-CM | POA: Diagnosis not present

## 2016-12-22 DIAGNOSIS — K589 Irritable bowel syndrome without diarrhea: Secondary | ICD-10-CM | POA: Diagnosis not present

## 2016-12-22 DIAGNOSIS — F419 Anxiety disorder, unspecified: Secondary | ICD-10-CM | POA: Diagnosis not present

## 2016-12-22 DIAGNOSIS — I252 Old myocardial infarction: Secondary | ICD-10-CM | POA: Diagnosis not present

## 2016-12-22 NOTE — Progress Notes (Signed)
Cardiac Individual Treatment Plan  Patient Details  Name: [name] MRN: [id number] Date of Birth: 1956-05-05 Referring Provider:     CARDIAC REHAB PHASE II ORIENTATION from 11/11/2016 in [place]  Referring Provider  Quay Burow MD      Initial Encounter Date:    CARDIAC REHAB PHASE II ORIENTATION from 11/11/2016 in [place]  Date  11/11/16  Referring Provider  Quay Burow MD      Visit Diagnosis: Stable angina [facility])  Patient's Home Medications on Admission:  Current Outpatient Prescriptions:  .  ALPRAZolam (XANAX) 0.5 MG tablet, Take 0.5 mg by mouth daily. , Disp: , Rfl:  .  amLODipine (NORVASC) 5 MG tablet, Take 1 tablet (5 mg total) by mouth daily., Disp: 30 tablet, Rfl: 11 .  aspirin EC 81 MG EC tablet, Take 1 tablet (81 mg total) by mouth daily., Disp: , Rfl:  .  atorvastatin (LIPITOR) 40 MG tablet, Take 1 tablet (40 mg total) by mouth daily at 6 PM., Disp: 30 tablet, Rfl: 9 .  clopidogrel (PLAVIX) 75 MG tablet, TAKE 1 TABLET BY MOUTH ONCE A DAY WITH BREAKFAST, Disp: 30 tablet, Rfl: 10 .  estradiol (ESTRACE) 0.5 MG tablet, Take 1 mg by mouth daily. , Disp: , Rfl:  .  isosorbide mononitrate (IMDUR) 60 MG 24 hr tablet, TAKE 1 TABLET BY MOUTH ONCE A DAY, Disp: 30 tablet, Rfl: 11 .  lisinopril (PRINIVIL,ZESTRIL) 5 MG tablet, TAKE 1 TABLET BY MOUTH DAILY., Disp: 90 tablet, Rfl: 3 .  metoprolol tartrate (LOPRESSOR) 25 MG tablet, TAKE 1 TABLET BY MOUTH TWICE A DAY, Disp: 60 tablet, Rfl: 11 .  nitroGLYCERIN (NITROSTAT) 0.4 MG SL tablet, Place 1 tablet (0.4 mg total) under the tongue every 5 (five) minutes as needed for chest pain., Disp: 25 tablet, Rfl: 5 .  pantoprazole (PROTONIX) 40 MG tablet, TAKE 1 TABLET BY MOUTH TWICE A DAY, Disp: 60 tablet, Rfl: 10 .  RANEXA 1000 MG SR tablet, TAKE 1 TABLET BY MOUTH 2 TIMES DAILY., Disp: 180 tablet, Rfl: 2  Past Medical History: Past Medical History:   Diagnosis Date  . Angina effort ([place]) 05/22/2013  . Anxiety   . CAD (coronary artery disease), possible SCAD 12/14/2012  . Colon polyps   . Duodenitis   . Dyspnea on exertion    LEXISCAN, 09/30/2008 - normal, EKG negative for ischemia, no ECG changes  . Esophageal stricture   . Gastritis   . GERD (gastroesophageal reflux disease)   . Gout    "only from RX given after MI" (01/22/2013)  . H/O hiatal hernia   . History of esophageal stricture   . Hypertension    2D ECHO, 09/30/2008 - EF >55%, normal: RENAL DOPPLER, 03/21/2007 - normal duplex study  . Hypothyroidism   . IBS (irritable bowel syndrome)   . Ischemic colitis ([place])   . Lower GI bleeding    10/07/11 "I've had 3 episodes in the past year"  . Myocardial infarction [facility]) 12/10/2012   "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)  . Tubular adenoma polyp of rectum 2008    Tobacco Use: History  Smoking Status  . Never Smoker  Smokeless Tobacco  . Never Used    Labs: Recent Review Flowsheet Data    Labs for ITP Cardiac and Pulmonary Rehab Latest Ref Rng & Units 12/11/2012 04/15/2014 02/06/2015 02/07/2015 05/20/2015   Cholestrol 125 - 200 mg/dL 187 184 - 265(H) 298(H)   LDLCALC <130 mg/dL 103(H)  92 - 167(H) 204(H)   HDL >=46 mg/dL 52 63 - 60 61   Trlycerides <150 mg/dL 159(H) 145 - 192(H) 166(H)   Hemoglobin A1c 4.8 - 5.6 % - - 5.3 - -      Capillary Blood Glucose: Lab Results  Component Value Date   GLUCAP 97 12/12/2012   GLUCAP 91 12/12/2012   GLUCAP 81 10/03/2008     Exercise Target Goals:    Exercise Program Goal: Individual exercise prescription set with THRR, safety & activity barriers. Participant demonstrates ability to understand and report RPE using BORG scale, to self-measure pulse accurately, and to acknowledge the importance of the exercise prescription.  Exercise Prescription Goal: Starting with aerobic activity 30 plus minutes a day, 3 days per week for initial exercise prescription. Provide home  exercise prescription and guidelines that participant acknowledges understanding prior to discharge.  Activity Barriers & Risk Stratification:     Activity Barriers & Cardiac Risk Stratification - 11/11/16 0932      Activity Barriers & Cardiac Risk Stratification   Activity Barriers Deconditioning;Muscular Weakness   Cardiac Risk Stratification High      6 Minute Walk:     6 Minute Walk    Row Name 11/11/16 1239         6 Minute Walk   Phase Initial     Distance 1350 feet     Walk Time 6 minutes     # of Rest Breaks 0     MPH 2.6     METS 2.8     RPE 9     VO2 Peak 9.7     Symptoms No     Resting HR 72 bpm     Resting BP 118/82     Max Ex. HR 95 bpm     Max Ex. BP 138/84     2 Minute Post BP 102/80        Oxygen Initial Assessment:   Oxygen Re-Evaluation:   Oxygen Discharge (Final Oxygen Re-Evaluation):   Initial Exercise Prescription:     Initial Exercise Prescription - 11/11/16 1200      Date of Initial Exercise RX and Referring Provider   Date 11/11/16   Referring Provider Quay Burow MD     Treadmill   MPH 2   Grade 0   Minutes 10   METs 2.53     Bike   Level 0.6   Minutes 10   METs 2.13     NuStep   Level 1   Minutes 10   METs 1.8     Prescription Details   Frequency (times per week) 5   Duration Progress to 45 minutes of aerobic exercise without signs/symptoms of physical distress     Intensity   THRR 40-80% of Max Heartrate 64-128   Ratings of Perceived Exertion 11-13   Perceived Dyspnea 0-4     Progression   Progression Continue to progress workloads to maintain intensity without signs/symptoms of physical distress.     Resistance Training   Training Prescription Yes   Weight 2   Reps 10-15      Perform Capillary Blood Glucose checks as needed.  Exercise Prescription Changes:     Exercise Prescription Changes    Row Name 11/22/16 1600 12/06/16 1000 12/22/16 1000         Response to Exercise   Blood  Pressure (Admit) 112/70 110/70 122/82     Blood Pressure (Exercise) 160/80 128/80 124/72     Blood Pressure (  Exit) 112/70 116/78 104/62     Heart Rate (Admit) 93 bpm 65 bpm 68 bpm     Heart Rate (Exercise) 100 bpm 100 bpm 104 bpm     Heart Rate (Exit) 91 bpm 80 bpm 66 bpm     Rating of Perceived Exertion (Exercise) 13 13 14      Symptoms none none none     Comments Pt is tolerating exercise in cardiac rehab very well; will continue to monitor pt's progress adjusted Ex Rx. pt moved from airdyne bike to recumbent bike due to comfort  -     Duration Continue with 30 min of aerobic exercise without signs/symptoms of physical distress. Continue with 30 min of aerobic exercise without signs/symptoms of physical distress. Continue with 30 min of aerobic exercise without signs/symptoms of physical distress.     Intensity THRR unchanged THRR unchanged THRR unchanged       Progression   Progression  - Continue to progress workloads to maintain intensity without signs/symptoms of physical distress. Continue to progress workloads to maintain intensity without signs/symptoms of physical distress.     Average METs  - 3 3.6       Resistance Training   Training Prescription Yes Yes Yes     Weight 2 3lbs 3lbs     Reps 10-15 10-15 10-15     Time 10 Minutes 10 Minutes 10 Minutes       Treadmill   MPH 2.5 2.8 2.9     Grade 0 1 2     Minutes 10 10 10      METs 2.91 3.53 4.02       Bike   Level 0.6  -  -     Minutes 10  -  -     METs 2.13  -  -       Recumbant Bike   Level  - 2.5 3.4     Watts  -  - 42     Minutes  - 10 10     METs  - 2.5 3.2       NuStep   Level 3 3 5      Minutes 10 10 10      METs 2.4 3 4        Home Exercise Plan   Plans to continue exercise at  - [employer] (comment)  [place] (comment)  [employer]     Frequency  - Add 3 additional days to program exercise sessions. Add 3 additional days to program exercise sessions.     Initial Home Exercises Provided  -  11/29/16 11/29/16        Exercise Comments:     Exercise Comments    Row Name 11/23/16 1343 12/15/16 1053         Exercise Comments Reviewed METs and activity levels with pt. Pt is tolerating exercise very well; will continue to monitor exercise progression. Reviewed METs and activity levels with pt. Pt is tolerating exercise very well; will continue to monitor exercise progression.         Exercise Goals and Review:     Exercise Goals    Row Name 11/11/16 0936 11/23/16 1340           Exercise Goals   Increase Physical Activity (P)  Yes  Build confidence with functional acitivites and ADL's Yes      Intervention (P)  Provide advice, education, support and counseling about physical activity/exercise needs.;Develop an individualized exercise prescription for aerobic and  resistive training based on initial evaluation findings, risk stratification, comorbidities and participant's personal goals. Provide advice, education, support and counseling about physical activity/exercise needs.;Develop an individualized exercise prescription for aerobic and resistive training based on initial evaluation findings, risk stratification, comorbidities and participant's personal goals.      Expected Outcomes (P)  Achievement of increased cardiorespiratory fitness and enhanced flexibility, muscular endurance and strength shown through measurements of functional capacity and personal statement of participant. Achievement of increased cardiorespiratory fitness and enhanced flexibility, muscular endurance and strength shown through measurements of functional capacity and personal statement of participant.      Increase Strength and Stamina (P)  Yes  Be able to climb stairs and uphill without fatigue and SOB Yes      Intervention (P)  Provide advice, education, support and counseling about physical activity/exercise needs.;Develop an individualized exercise prescription for aerobic and resistive training  based on initial evaluation findings, risk stratification, comorbidities and participant's personal goals. Provide advice, education, support and counseling about physical activity/exercise needs.;Develop an individualized exercise prescription for aerobic and resistive training based on initial evaluation findings, risk stratification, comorbidities and participant's personal goals.      Expected Outcomes (P)  Achievement of increased cardiorespiratory fitness and enhanced flexibility, muscular endurance and strength shown through measurements of functional capacity and personal statement of participant. Achievement of increased cardiorespiratory fitness and enhanced flexibility, muscular endurance and strength shown through measurements of functional capacity and personal statement of participant.         Exercise Goals Re-Evaluation :     Exercise Goals Re-Evaluation    Row Name 11/23/16 1340 11/29/16 0943 12/15/16 1029         Exercise Goal Re-Evaluation   Exercise Goals Review Increase Physical Activity;Increase Strenth and Stamina Increase Physical Activity;Increase Strenth and Stamina Increase Physical Activity;Increase Strenth and Stamina     Comments Pt is tolerating exercise very well in cardiac rehab. Reviewed home exercise with pt today.  Pt plans to go the [facility] for exercise and use exercise equipment 2x/week in addition to coming to cardiac rehab.  Reviewed THR, pulse, RPE, sign and symptoms, NTG use, and when to call 911 or MD.  Also discussed weather considerations and indoor options.  Pt voiced understanding. Pt stated "having more energy/stamina" and "able to perform ADL's without difficulty or fatigue." Pt is exercising at [employer] 2-3x/week for 30 minutes     Expected Outcomes Pt will continue to come 3x/week to cardiac rehab to work on increasing cardiorespiratory fitness Pt will be compliant with HEP and continue to improve in cardiorespiratory fitness Pt will be compliant  with HEP and continue to improve in cardiorespiratory fitness         Discharge Exercise Prescription (Final Exercise Prescription Changes):     Exercise Prescription Changes - 12/22/16 1000      Response to Exercise   Blood Pressure (Admit) 122/82   Blood Pressure (Exercise) 124/72   Blood Pressure (Exit) 104/62   Heart Rate (Admit) 68 bpm   Heart Rate (Exercise) 104 bpm   Heart Rate (Exit) 66 bpm   Rating of Perceived Exertion (Exercise) 14   Symptoms none   Duration Continue with 30 min of aerobic exercise without signs/symptoms of physical distress.   Intensity THRR unchanged     Progression   Progression Continue to progress workloads to maintain intensity without signs/symptoms of physical distress.   Average METs 3.6     Resistance Training   Training Prescription Yes   Weight  3lbs   Reps 10-15   Time 10 Minutes     Treadmill   MPH 2.9   Grade 2   Minutes 10   METs 4.02     Recumbant Bike   Level 3.4   Watts 42   Minutes 10   METs 3.2     NuStep   Level 5   Minutes 10   METs 4     Home Exercise Plan   Plans to continue exercise at [employer] (comment)  [employer]   Frequency Add 3 additional days to program exercise sessions.   Initial Home Exercises Provided 11/29/16      Nutrition:  Target Goals: Understanding of nutrition guidelines, daily intake of sodium 1500mg , cholesterol 200mg , calories 30% from fat and 7% or less from saturated fats, daily to have 5 or more servings of fruits and vegetables.  Biometrics:     Pre Biometrics - 11/11/16 1245      Pre Biometrics   Waist Circumference 40.25 inches   Hip Circumference 52 inches   Waist to Hip Ratio 0.77 %   Triceps Skinfold 52 mm   % Body Fat 51.1 %   Grip Strength 35 kg   Flexibility 10 in   Single Leg Stand 8.03 seconds       Nutrition Therapy Plan and Nutrition Goals:     Nutrition Therapy & Goals - 12/06/16 0953      Nutrition Therapy   Diet Therapeutic Lifestyle  Changes     Personal Nutrition Goals   Nutrition Goal Wt loss of 1-2 lb/week to a wt loss goal of 6-24 lb at graduation from [place], educate and counsel regarding individualized specific dietary modifications aiming towards targeted core components such as weight, hypertension, lipid management, diabetes, heart failure and other comorbidities.;Nutrition handout(s) given to patient.  1500 kcal, 5 day menu ideas   Expected Outcomes Short Term Goal: Understand basic principles of dietary content, such as calories, fat, sodium, cholesterol and nutrients.;Long Term Goal: Adherence to prescribed nutrition plan.      Nutrition Discharge: Nutrition Scores:     Nutrition Assessments - 12/06/16 0953      MEDFICTS Scores   Pre Score 28      Nutrition Goals Re-Evaluation:   Nutrition Goals Re-Evaluation:   Nutrition Goals Discharge (Final Nutrition Goals Re-Evaluation):   Psychosocial: Target Goals: Acknowledge presence or absence of significant depression and/or stress, maximize coping skills, provide positive support system. Participant is able to verbalize types and ability to use techniques and skills needed for reducing stress and depression.  Initial Review & Psychosocial Screening:     Initial Psych Review & Screening - 11/15/16 1105      Initial Review   Current issues with Current Stress Concerns   Source of Stress Concerns Chronic Illness;Unable to participate in former interests or hobbies     [place]? Yes     Barriers   Psychosocial barriers to participate in program There are no identifiable barriers or psychosocial needs.     Screening Interventions   Interventions Encouraged to exercise      Quality of Life Scores:     Quality of Life - 11/29/16 0918      Quality of Life Scores   Health/Function Pre 18.96 %  pt concerned about recent cardiac event and fatigue. pt has  fears and worries about living with CAD.  pt is looking  forward to resuming usual activities however is uncertain she can.  pt feels her fatigue is related to her thyroid.     Socioeconomic Pre 27.83 %   Psych/Spiritual Pre 14.57 %  pt displays health related anxiety from recent lifestyle changes from cardiac event.  Fatigue has resulted in decreased ability to participate in pleasurable activities.     Family Pre 22.5 %   GLOBAL Pre 20.15 %  pt offered reassurance and emotional support       PHQ-9: Recent Review Flowsheet Data    Depression screen [facility] 2/9 11/15/2016   Decreased Interest 0   Down, Depressed, Hopeless 0   PHQ - 2 Score 0     Interpretation of Total Score  Total Score Depression Severity:  1-4 = Minimal depression, 5-9 = Mild depression, 10-14 = Moderate depression, 15-19 = Moderately severe depression, 20-27 = Severe depression   Psychosocial Evaluation and Intervention:     Psychosocial Evaluation - 11/15/16 1107      Psychosocial Evaluation & Interventions   Interventions Encouraged to exercise with the program and follow exercise prescription;Stress management education;Relaxation education   Comments health related anxiety    Expected Outcomes pt will demonstrate positive outlook wtih good coping skills.    Continue Psychosocial Services  No Follow up required      Psychosocial Re-Evaluation:     Psychosocial Re-Evaluation    [place] Name 11/24/16 1713 12/22/16 1528           Psychosocial Re-Evaluation   Current issues with Current Anxiety/Panic Current Anxiety/Panic      Comments pt with health related anxiety is tolerating CR participation without difficulty.  exhibits positive interactions with staff and class mates.  pt with health related anxiety is tolerating CR participation without difficulty.  exhibits positive interactions with staff and class mates.       Expected Outcomes pt will exhibit positive outlook with good coping skills.  pt will  exhibit positive outlook with good coping skills.       Interventions Encouraged to attend Cardiac Rehabilitation for the exercise;Stress management education;Relaxation education Encouraged to attend Cardiac Rehabilitation for the exercise;Stress management education;Relaxation education      Continue Psychosocial Services  Follow up required by staff Follow up required by staff         Psychosocial Discharge (Final Psychosocial Re-Evaluation):     Psychosocial Re-Evaluation - 12/22/16 1528      Psychosocial Re-Evaluation   Current issues with Current Anxiety/Panic   Comments pt with health related anxiety is tolerating CR participation without difficulty.  exhibits positive interactions with staff and class mates.    Expected Outcomes pt will exhibit positive outlook with good coping skills.    Interventions Encouraged to attend Cardiac Rehabilitation for the exercise;Stress management education;Relaxation education   Continue Psychosocial Services  Follow up required by staff      Vocational Rehabilitation: Provide vocational rehab assistance to qualifying candidates.   Vocational Rehab Evaluation & Intervention:     Vocational Rehab - 11/11/16 1658      Initial Vocational Rehab Evaluation & Intervention   Assessment shows need for Vocational Rehabilitation No      Education: Education Goals: Education classes will be provided on a weekly basis, covering required topics. Participant will state understanding/return demonstration of topics presented.  Learning Barriers/Preferences:     Learning Barriers/Preferences - 11/11/16 0935      Learning Barriers/Preferences   Learning Barriers Sight   Learning Preferences Written Material;Skilled Demonstration  Education Topics: Count Your Pulse:  -Group instruction provided by verbal instruction, demonstration, patient participation and written materials to support subject.  Instructors address importance of being able  to find your pulse and how to count your pulse when at home without a heart monitor.  Patients get hands on experience counting their pulse with staff help and individually.   Heart Attack, Angina, and Risk Factor Modification:  -Group instruction provided by verbal instruction, video, and written materials to support subject.  Instructors address signs and symptoms of angina and heart attacks.    Also discuss risk factors for heart disease and how to make changes to improve heart health risk factors.   Functional Fitness:  -Group instruction provided by verbal instruction, demonstration, patient participation, and written materials to support subject.  Instructors address safety measures for doing things around the house.  Discuss how to get up and down off the floor, how to pick things up properly, how to safely get out of a chair without assistance, and balance training.   Meditation and Mindfulness:  -Group instruction provided by verbal instruction, patient participation, and written materials to support subject.  Instructor addresses importance of mindfulness and meditation practice to help reduce stress and improve awareness.  Instructor also leads participants through a meditation exercise.    Stretching for Flexibility and Mobility:  -Group instruction provided by verbal instruction, patient participation, and written materials to support subject.  Instructors lead participants through series of stretches that are designed to increase flexibility thus improving mobility.  These stretches are additional exercise for major muscle groups that are typically performed during regular warm up and cool down.   Hands Only CPR:  -Group verbal, video, and participation provides a basic overview of AHA guidelines for community CPR. Role-play of emergencies allow participants the opportunity to practice calling for help and chest compression technique with discussion of AED  use.   Hypertension: -Group verbal and written instruction that provides a basic overview of hypertension including the most recent diagnostic guidelines, risk factor reduction with self-care instructions and medication management.    Nutrition I class: Heart Healthy Eating:  -Group instruction provided by PowerPoint slides, verbal discussion, and written materials to support subject matter. The instructor gives an explanation and review of the Therapeutic Lifestyle Changes diet recommendations, which includes a discussion on lipid goals, dietary fat, sodium, fiber, plant stanol/sterol esters, sugar, and the components of a well-balanced, healthy diet.   Nutrition II class: Lifestyle Skills:  -Group instruction provided by PowerPoint slides, verbal discussion, and written materials to support subject matter. The instructor gives an explanation and review of label reading, grocery shopping for heart health, heart healthy recipe modifications, and ways to make healthier choices when eating out.   Diabetes Question & Answer:  -Group instruction provided by PowerPoint slides, verbal discussion, and written materials to support subject matter. The instructor gives an explanation and review of diabetes co-morbidities, pre- and post-prandial blood glucose goals, pre-exercise blood glucose goals, signs, symptoms, and treatment of hypoglycemia and hyperglycemia, and foot care basics.   Diabetes Blitz:  -Group instruction provided by PowerPoint slides, verbal discussion, and written materials to support subject matter. The instructor gives an explanation and review of the physiology behind type 1 and type 2 diabetes, diabetes medications and rational behind using different medications, pre- and post-prandial blood glucose recommendations and Hemoglobin A1c goals, diabetes diet, and exercise including blood glucose guidelines for exercising safely.    Portion Distortion:  -Group instruction provided by  PowerPoint  slides, verbal discussion, written materials, and food models to support subject matter. The instructor gives an explanation of serving size versus portion size, changes in portions sizes over the last 20 years, and what consists of a serving from each food group.   CARDIAC REHAB PHASE II EXERCISE from 12/15/2016 in [place]  Date  11/25/16  Educator  RD  Instruction Review Code  2- meets goals/outcomes      Stress Management:  -Group instruction provided by verbal instruction, video, and written materials to support subject matter.  Instructors review role of stress in heart disease and how to cope with stress positively.     CARDIAC REHAB PHASE II EXERCISE from 12/15/2016 in [place]  Date  12/01/16  Instruction Review Code  2- meets goals/outcomes      Exercising on Your Own:  -Group instruction provided by verbal instruction, power point, and written materials to support subject.  Instructors discuss benefits of exercise, components of exercise, frequency and intensity of exercise, and end points for exercise.  Also discuss use of nitroglycerin and activating EMS.  Review options of places to exercise outside of rehab.  Review guidelines for sex with heart disease.   CARDIAC REHAB PHASE II EXERCISE from 12/15/2016 in [place]  Date  12/15/16  Instruction Review Code  2- meets goals/outcomes      Cardiac Drugs I:  -Group instruction provided by verbal instruction and written materials to support subject.  Instructor reviews cardiac drug classes: antiplatelets, anticoagulants, beta blockers, and statins.  Instructor discusses reasons, side effects, and lifestyle considerations for each drug class.   Cardiac Drugs II:  -Group instruction provided by verbal instruction and written materials to support subject.  Instructor reviews cardiac drug classes: angiotensin converting  enzyme inhibitors (ACE-I), angiotensin II receptor blockers (ARBs), nitrates, and calcium channel blockers.  Instructor discusses reasons, side effects, and lifestyle considerations for each drug class.   CARDIAC REHAB PHASE II EXERCISE from 12/15/2016 in [place]  Date  12/08/16  Educator  Kennyth Lose  Instruction Review Code  2- meets goals/outcomes      Anatomy and Physiology of the Circulatory System:  Group verbal and written instruction and models provide basic cardiac anatomy and physiology, with the coronary electrical and arterial systems. Review of: AMI, Angina, Valve disease, Heart Failure, Peripheral Artery Disease, Cardiac Arrhythmia, Pacemakers, and the ICD.   Other Education:  -Group or individual verbal, written, or video instructions that support the educational goals of the cardiac rehab program.   Knowledge Questionnaire Score:     Knowledge Questionnaire Score - 11/11/16 1238      Knowledge Questionnaire Score   Pre Score 18/24      Core Components/Risk Factors/Patient Goals at Admission:     Personal Goals and Risk Factors at Admission - 11/11/16 1654      Core Components/Risk Factors/Patient Goals on Admission    Weight Management Yes   Intervention Weight Management: Develop a combined nutrition and exercise program designed to reach desired caloric intake, while maintaining appropriate intake of nutrient and fiber, sodium and fats, and appropriate energy expenditure required for the weight goal.;Weight Management: Provide education and appropriate resources to help participant work on and attain dietary goals.;Weight Management/Obesity: Establish reasonable short term and long term weight goals.;Obesity: Provide education and appropriate resources to help participant work on and attain dietary goals.   Expected Outcomes Short Term: Continue to assess  and modify interventions until short term weight is achieved;Long Term: Adherence  to nutrition and physical activity/exercise program aimed toward attainment of established weight goal;Weight Loss: Understanding of general recommendations for a balanced deficit meal plan, which promotes 1-2 lb weight loss per week and includes a negative energy balance of [id number] kcal/d;Weight Maintenance: Understanding of the daily nutrition guidelines, which includes 25-35% calories from fat, 7% or less cal from saturated fats, less than 200mg  cholesterol, less than 1.5gm of sodium, & 5 or more servings of fruits and vegetables daily;Understanding recommendations for meals to include 15-35% energy as protein, 25-35% energy from fat, 35-60% energy from carbohydrates, less than 200mg  of dietary cholesterol, 20-35 gm of total fiber daily;Understanding of distribution of calorie intake throughout the day with the consumption of 4-5 meals/snacks   Hypertension Yes   Intervention Provide education on lifestyle modifcations including regular physical activity/exercise, weight management, moderate sodium restriction and increased consumption of fresh fruit, vegetables, and low fat dairy, alcohol moderation, and smoking cessation.;Monitor prescription use compliance.   Expected Outcomes Short Term: Continued assessment and intervention until BP is < 140/43mm HG in hypertensive participants. < 130/79mm HG in hypertensive participants with diabetes, heart failure or chronic kidney disease.;Long Term: Maintenance of blood pressure at goal levels.   Lipids Yes   Intervention Provide education and support for participant on nutrition & aerobic/resistive exercise along with prescribed medications to achieve LDL 70mg , HDL >40mg .   Expected Outcomes Short Term: Participant states understanding of desired cholesterol values and is compliant with medications prescribed. Participant is following exercise prescription and nutrition guidelines.;Long Term: Cholesterol controlled with medications as prescribed, with  individualized exercise RX and with personalized nutrition plan. Value goals: LDL < 70mg , HDL > 40 mg.      Core Components/Risk Factors/Patient Goals Review:      Goals and Risk Factor Review    Row Name 11/24/16 1711 12/22/16 1526           Core Components/Risk Factors/Patient Goals Review   Personal Goals Review Weight Management/Obesity;Lipids;Hypertension Weight Management/Obesity;Lipids;Hypertension      Review pt exhibits positive outlook about CR participation.  pt exhibits positive outlook about CR participation. pt is currently exercising on her own at [facility].  pt is eager to resume water aerobics. pt is also eager to lose weight and expresses disappointment she has not lost weight yet. pt encouraged to continue nutrition and exercise guidelines to facilitate weight loss goals.       Expected Outcomes pt will participate in cardiac rehab exercise, nutrition and lifestyle modification education classes to decrease overall CAD risk factors.  pt will participate in cardiac rehab exercise, nutrition and lifestyle modification education classes to decrease overall CAD risk factors.          Core Components/Risk Factors/Patient Goals at Discharge (Final Review):      Goals and Risk Factor Review - 12/22/16 1526      Core Components/Risk Factors/Patient Goals Review   Personal Goals Review Weight Management/Obesity;Lipids;Hypertension   Review pt exhibits positive outlook about CR participation. pt is currently exercising on her own at [facility].  pt is eager to resume water aerobics. pt is also eager to lose weight and expresses disappointment she has not lost weight yet. pt encouraged to continue nutrition and exercise guidelines to facilitate weight loss goals.    Expected Outcomes pt will participate in cardiac rehab exercise, nutrition and lifestyle modification education classes to decrease overall CAD risk factors.       ITP  Comments:     ITP Comments    Row  Name 11/11/16 0932           ITP Comments Medical Director, Dr. Fransico Him          Comments: Pt is making expected progress toward personal goals after completing 10 sessions. Recommend continued exercise and life style modification education including  stress management and relaxation techniques to decrease cardiac risk profile.

## 2016-12-24 ENCOUNTER — Encounter (HOSPITAL_COMMUNITY): Payer: Federal, State, Local not specified - PPO

## 2016-12-29 ENCOUNTER — Encounter (HOSPITAL_COMMUNITY): Payer: Federal, State, Local not specified - PPO

## 2016-12-31 ENCOUNTER — Encounter (HOSPITAL_COMMUNITY): Admission: RE | Admit: 2016-12-31 | Payer: Federal, State, Local not specified - PPO | Source: Ambulatory Visit

## 2017-01-03 ENCOUNTER — Encounter (HOSPITAL_COMMUNITY)
Admission: RE | Admit: 2017-01-03 | Discharge: 2017-01-03 | Disposition: A | Discharge status: Home / Self Care | Payer: Federal, State, Local not specified - PPO | Source: Ambulatory Visit | Attending: Cardiovascular Disease | Admitting: Cardiovascular Disease

## 2017-01-03 DIAGNOSIS — I10 Essential (primary) hypertension: Secondary | ICD-10-CM | POA: Insufficient documentation

## 2017-01-03 DIAGNOSIS — Z7982 Long term (current) use of aspirin: Secondary | ICD-10-CM | POA: Insufficient documentation

## 2017-01-03 DIAGNOSIS — F419 Anxiety disorder, unspecified: Secondary | ICD-10-CM | POA: Diagnosis not present

## 2017-01-03 DIAGNOSIS — I252 Old myocardial infarction: Secondary | ICD-10-CM | POA: Insufficient documentation

## 2017-01-03 DIAGNOSIS — K589 Irritable bowel syndrome without diarrhea: Secondary | ICD-10-CM | POA: Insufficient documentation

## 2017-01-03 DIAGNOSIS — Z79899 Other long term (current) drug therapy: Secondary | ICD-10-CM | POA: Insufficient documentation

## 2017-01-03 DIAGNOSIS — E109 Type 1 diabetes mellitus without complications: Secondary | ICD-10-CM | POA: Insufficient documentation

## 2017-01-03 DIAGNOSIS — K219 Gastro-esophageal reflux disease without esophagitis: Secondary | ICD-10-CM | POA: Diagnosis not present

## 2017-01-03 DIAGNOSIS — Z7902 Long term (current) use of antithrombotics/antiplatelets: Secondary | ICD-10-CM | POA: Insufficient documentation

## 2017-01-03 DIAGNOSIS — I208 Other forms of angina pectoris: Secondary | ICD-10-CM

## 2017-01-03 DIAGNOSIS — E039 Hypothyroidism, unspecified: Secondary | ICD-10-CM | POA: Insufficient documentation

## 2017-01-03 DIAGNOSIS — I25118 Atherosclerotic heart disease of native coronary artery with other forms of angina pectoris: Secondary | ICD-10-CM | POA: Insufficient documentation

## 2017-01-05 ENCOUNTER — Encounter (HOSPITAL_COMMUNITY)
Admission: RE | Admit: 2017-01-05 | Discharge: 2017-01-05 | Disposition: A | Discharge status: Home / Self Care | Payer: Federal, State, Local not specified - PPO | Source: Ambulatory Visit | Attending: Cardiovascular Disease | Admitting: Cardiovascular Disease

## 2017-01-05 DIAGNOSIS — Z79899 Other long term (current) drug therapy: Secondary | ICD-10-CM | POA: Diagnosis not present

## 2017-01-05 DIAGNOSIS — I252 Old myocardial infarction: Secondary | ICD-10-CM | POA: Diagnosis not present

## 2017-01-05 DIAGNOSIS — K219 Gastro-esophageal reflux disease without esophagitis: Secondary | ICD-10-CM | POA: Diagnosis not present

## 2017-01-05 DIAGNOSIS — I208 Other forms of angina pectoris: Secondary | ICD-10-CM

## 2017-01-05 DIAGNOSIS — E039 Hypothyroidism, unspecified: Secondary | ICD-10-CM | POA: Diagnosis not present

## 2017-01-05 DIAGNOSIS — K589 Irritable bowel syndrome without diarrhea: Secondary | ICD-10-CM | POA: Diagnosis not present

## 2017-01-05 DIAGNOSIS — Z7982 Long term (current) use of aspirin: Secondary | ICD-10-CM | POA: Diagnosis not present

## 2017-01-05 DIAGNOSIS — E109 Type 1 diabetes mellitus without complications: Secondary | ICD-10-CM | POA: Diagnosis not present

## 2017-01-05 DIAGNOSIS — I10 Essential (primary) hypertension: Secondary | ICD-10-CM | POA: Diagnosis not present

## 2017-01-05 DIAGNOSIS — F419 Anxiety disorder, unspecified: Secondary | ICD-10-CM | POA: Diagnosis not present

## 2017-01-05 DIAGNOSIS — I25118 Atherosclerotic heart disease of native coronary artery with other forms of angina pectoris: Secondary | ICD-10-CM | POA: Diagnosis not present

## 2017-01-05 DIAGNOSIS — Z7902 Long term (current) use of antithrombotics/antiplatelets: Secondary | ICD-10-CM | POA: Diagnosis not present

## 2017-01-07 ENCOUNTER — Encounter (HOSPITAL_COMMUNITY): Payer: Federal, State, Local not specified - PPO

## 2017-01-10 ENCOUNTER — Encounter (HOSPITAL_COMMUNITY)
Admission: RE | Admit: 2017-01-10 | Discharge: 2017-01-10 | Disposition: A | Discharge status: Home / Self Care | Payer: Federal, State, Local not specified - PPO | Source: Ambulatory Visit | Attending: Cardiovascular Disease | Admitting: Cardiovascular Disease

## 2017-01-10 DIAGNOSIS — I252 Old myocardial infarction: Secondary | ICD-10-CM | POA: Diagnosis not present

## 2017-01-10 DIAGNOSIS — E109 Type 1 diabetes mellitus without complications: Secondary | ICD-10-CM | POA: Diagnosis not present

## 2017-01-10 DIAGNOSIS — I208 Other forms of angina pectoris: Secondary | ICD-10-CM

## 2017-01-10 DIAGNOSIS — I10 Essential (primary) hypertension: Secondary | ICD-10-CM | POA: Diagnosis not present

## 2017-01-10 DIAGNOSIS — I25118 Atherosclerotic heart disease of native coronary artery with other forms of angina pectoris: Secondary | ICD-10-CM | POA: Diagnosis not present

## 2017-01-10 DIAGNOSIS — Z7902 Long term (current) use of antithrombotics/antiplatelets: Secondary | ICD-10-CM | POA: Diagnosis not present

## 2017-01-10 DIAGNOSIS — Z79899 Other long term (current) drug therapy: Secondary | ICD-10-CM | POA: Diagnosis not present

## 2017-01-10 DIAGNOSIS — F419 Anxiety disorder, unspecified: Secondary | ICD-10-CM | POA: Diagnosis not present

## 2017-01-10 DIAGNOSIS — K589 Irritable bowel syndrome without diarrhea: Secondary | ICD-10-CM | POA: Diagnosis not present

## 2017-01-10 DIAGNOSIS — E039 Hypothyroidism, unspecified: Secondary | ICD-10-CM | POA: Diagnosis not present

## 2017-01-10 DIAGNOSIS — K219 Gastro-esophageal reflux disease without esophagitis: Secondary | ICD-10-CM | POA: Diagnosis not present

## 2017-01-10 DIAGNOSIS — Z7982 Long term (current) use of aspirin: Secondary | ICD-10-CM | POA: Diagnosis not present

## 2017-01-12 ENCOUNTER — Ambulatory Visit (INDEPENDENT_AMBULATORY_CARE_PROVIDER_SITE_OTHER): Payer: Federal, State, Local not specified - PPO | Admitting: Family Medicine

## 2017-01-12 ENCOUNTER — Encounter (HOSPITAL_COMMUNITY)
Admission: RE | Admit: 2017-01-12 | Discharge: 2017-01-12 | Disposition: A | Discharge status: Home / Self Care | Payer: Federal, State, Local not specified - PPO | Source: Ambulatory Visit | Attending: Cardiovascular Disease | Admitting: Cardiovascular Disease

## 2017-01-12 VITALS — BP 109/76 | HR 65 | Temp 98.2°F | Ht 63.0 in | Wt 217.4 lb

## 2017-01-12 DIAGNOSIS — I252 Old myocardial infarction: Secondary | ICD-10-CM | POA: Diagnosis not present

## 2017-01-12 DIAGNOSIS — Z131 Encounter for screening for diabetes mellitus: Secondary | ICD-10-CM

## 2017-01-12 DIAGNOSIS — Z13220 Encounter for screening for lipoid disorders: Secondary | ICD-10-CM | POA: Diagnosis not present

## 2017-01-12 DIAGNOSIS — R7989 Other specified abnormal findings of blood chemistry: Secondary | ICD-10-CM

## 2017-01-12 DIAGNOSIS — I10 Essential (primary) hypertension: Secondary | ICD-10-CM

## 2017-01-12 DIAGNOSIS — F419 Anxiety disorder, unspecified: Secondary | ICD-10-CM | POA: Diagnosis not present

## 2017-01-12 DIAGNOSIS — E039 Hypothyroidism, unspecified: Secondary | ICD-10-CM | POA: Diagnosis not present

## 2017-01-12 DIAGNOSIS — R0981 Nasal congestion: Secondary | ICD-10-CM

## 2017-01-12 DIAGNOSIS — R5382 Chronic fatigue, unspecified: Secondary | ICD-10-CM

## 2017-01-12 DIAGNOSIS — Z8639 Personal history of other endocrine, nutritional and metabolic disease: Secondary | ICD-10-CM | POA: Diagnosis not present

## 2017-01-12 DIAGNOSIS — Z7902 Long term (current) use of antithrombotics/antiplatelets: Secondary | ICD-10-CM | POA: Diagnosis not present

## 2017-01-12 DIAGNOSIS — I208 Other forms of angina pectoris: Secondary | ICD-10-CM

## 2017-01-12 DIAGNOSIS — G479 Sleep disorder, unspecified: Secondary | ICD-10-CM | POA: Diagnosis not present

## 2017-01-12 DIAGNOSIS — Z79899 Other long term (current) drug therapy: Secondary | ICD-10-CM | POA: Diagnosis not present

## 2017-01-12 DIAGNOSIS — Z130 Encounter for screening for diseases of the blood and blood-forming organs and certain disorders involving the immune mechanism: Secondary | ICD-10-CM

## 2017-01-12 DIAGNOSIS — K219 Gastro-esophageal reflux disease without esophagitis: Secondary | ICD-10-CM | POA: Diagnosis not present

## 2017-01-12 DIAGNOSIS — R7303 Prediabetes: Secondary | ICD-10-CM | POA: Diagnosis not present

## 2017-01-12 DIAGNOSIS — K589 Irritable bowel syndrome without diarrhea: Secondary | ICD-10-CM | POA: Diagnosis not present

## 2017-01-12 DIAGNOSIS — E109 Type 1 diabetes mellitus without complications: Secondary | ICD-10-CM | POA: Diagnosis not present

## 2017-01-12 DIAGNOSIS — I25118 Atherosclerotic heart disease of native coronary artery with other forms of angina pectoris: Secondary | ICD-10-CM | POA: Diagnosis not present

## 2017-01-12 DIAGNOSIS — Z7982 Long term (current) use of aspirin: Secondary | ICD-10-CM | POA: Diagnosis not present

## 2017-01-12 MED ORDER — AMOXICILLIN 500 MG PO CAPS: 500.0000 mg | ORAL_CAPSULE | Freq: Three times a day (TID) | ORAL | 0 refills | Status: DC

## 2017-01-12 NOTE — Patient Instructions (Signed)
It was nice to see you today! I hope that your nose and sinuses feel better soon. If you are not getting better in this regard in the next few days start on the amoxicillin antbioitic.   I am sorry that you had a hard time getting your sleep study scheduled.  I would still encourage you to get this done as sleep apnea may certainly be causing your fatigue.    I will be in touch with your labs asap

## 2017-01-12 NOTE — Progress Notes (Addendum)
[place] at [facility] [address] [place], [place] [address] [phone]  Date:  01/12/2017   Name:  [name]   DOB:  09-11-1955   MRN:  [id number]  PCP:  Darreld Mclean, MD    Chief Complaint: Allergies   History of Present Illness:  [name] is a 61 y.o. very pleasant female patient who presents with the following:  Last seen by myself in February of this year:  Here today to establish care.  She would like for [place] to be her PCP.   Followed by cardiology (Dr. Gwenlyn Found) for CAD, HTN, Hyperlipidemia. She had an episode of SCAD/?NSTEMI in 2014.   She had a cath but did not require any intervention.  Since then she has been followed closely She is not able to really exercise due to her heart problems. However she has recently retired and plans to try and go back to cardiac rehab so she can be monitored while she is exercising.   She does have nitro but has never needed to use it  Notes that she feels tired and bad all the time, has for years.  Also she keeps gaining weight. However she does not feel that she is depressed.  She uses xanax for anxiety but does not think that depression is an issue for her She is interested in a sleep study to determine if she may have OSA.  She is unsure if she does snore.  However she is suspicious about OSA due to poor sleep and fatigue.   She is a never smoker She recently retired from the [facility] full labs on 02-07-2015 but also has labs from [facility] in January  She relates that she had a thyroid problem and was on ?thyroid replacement in the past.   However more recently her thyroid has been in normal range.  She is frustrated because she had hoped that she could be treated for hypothyroidism and that it might help with her energy and weight issues  She has been dx with low vitamin D; however she has not yet started taking her vitamin D supplement    Here today  because she is not feeling that well. She thinks that she has allergies but is also worried about a sinus infection Last week she noted PND - she tried afrin and claritin This helped her some, but she continues to have nasal congestion She has also noted some sneezing, ST due to drainage.  Some dry cough No fever.   However she does not really have sinus pressure and pain like she would have with a normal sinus infection  She did not end up getting her sleep study- it took a long time to get scheduled, and now she is busy doing cardiac rehab.  She notes that she still feels very tired.   Needs a nap most days. She does snore She needs a thyroid level today She also notes constipation- she has used miralax off and on as needed  Patient Active Problem List   Diagnosis Date Noted  . 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 Tachycardia, somewhat irregular, episodic 10/08/2013  . Dizziness 10/08/2013  . Angina effort ([place]) 05/22/2013  . Unstable angina ([place]) 01/22/2013  . Hyperlipidemia 01/01/2013  . CAD (coronary artery disease), possible SCAD 12/14/2012  . NSTEMI (non-ST elevated myocardial infarction) ([place]) 12/10/2012  . Essential hypertension 12/10/2012  . Hypothyroidism 12/10/2012  . Obesity (BMI  35.0-39.9 without comorbidity) 12/10/2012  . Gastroenteritis 10/08/2011  . Acute ischemic colitis ([place]) 08/09/2011  . DYSPHAGIA UNSPECIFIED 07/01/2010  . RECTAL BLEEDING Jan 2013 11/04/2009  . GERD 02/11/2009  . Irritable bowel syndrome 02/11/2009  . PERSONAL HX COLONIC POLYPS 02/11/2009    Past Medical History:  Diagnosis Date  . Angina effort ([place]) 05/22/2013  . Anxiety   . CAD (coronary artery disease), possible SCAD 12/14/2012  . Colon polyps   . Duodenitis   . Dyspnea on exertion    LEXISCAN, 09/30/2008 - normal, EKG negative for ischemia, no ECG changes  . Esophageal stricture   . Gastritis   . GERD (gastroesophageal reflux disease)   . Gout    "only from RX given after MI"  (01/22/2013)  . H/O hiatal hernia   . History of esophageal stricture   . Hypertension    2D ECHO, 09/30/2008 - EF >55%, normal: RENAL DOPPLER, 03/21/2007 - normal duplex study  . Hypothyroidism   . IBS (irritable bowel syndrome)   . Ischemic colitis ([place])   . Lower GI bleeding    10/07/11 "I've had 3 episodes in the past year"  . Myocardial infarction [facility]) 12/10/2012   "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)  . Tubular adenoma polyp of rectum 2008    Past Surgical History:  Procedure Laterality Date  . ABDOMINAL HYSTERECTOMY  1999  . ANTERIOR LUMBAR FUSION  2010   "L4-5" (01/22/2013)  . BACK SURGERY  09/2008  . CARDIAC CATHETERIZATION N/A 02/06/2015   Procedure: Left Heart Cath and Coronary Angiography;  Surgeon: Leonie Man, MD;  Location: [facility];  Service: Cardiovascular;  Laterality: N/A;  . ESOPHAGEAL DILATION  08/2010  . ESOPHAGEAL DILATION     "once or twice" (01/22/2013)  . HAMMER TOE SURGERY Bilateral ~ 2002  . LEFT HEART CATHETERIZATION WITH CORONARY ANGIOGRAM N/A 12/11/2012   Procedure: LEFT HEART CATHETERIZATION WITH CORONARY ANGIOGRAM;  Surgeon: Leonie Man, MD;  Location: [facility] CATH LAB;  Service: Cardiovascular;  Laterality: N/A;  . LEFT HEART CATHETERIZATION WITH CORONARY ANGIOGRAM N/A 12/15/2012   Procedure: LEFT HEART CATHETERIZATION WITH CORONARY ANGIOGRAM;  Surgeon: Leonie Man, MD;  Location: [facility] CATH LAB;  Service: Cardiovascular;  Laterality: N/A;  . PERIPHERAL VENOUS STUDY  10/03/2008   No evidence of DVT, superficial thrombosis, or Baker's cyst  . TONSILLECTOMY AND ADENOIDECTOMY  1960's  . TUBAL LIGATION  1985    Social History  Substance Use Topics  . Smoking status: Never Smoker  . Smokeless tobacco: Never Used  . Alcohol use No    Family History  Problem Relation Age of Onset  . Colon cancer Brother   . Kidney disease Mother   . Stroke Father   . Heart attack Brother     No Known Allergies  Medication list has been  reviewed and updated.  Current Outpatient Prescriptions on File Prior to Visit  Medication Sig Dispense Refill  . ALPRAZolam (XANAX) 0.5 MG tablet Take 0.5 mg by mouth daily.     Marland Kitchen amLODipine (NORVASC) 5 MG tablet Take 1 tablet (5 mg total) by mouth daily. 30 tablet 11  . aspirin EC 81 MG EC tablet Take 1 tablet (81 mg total) by mouth daily.    Marland Kitchen atorvastatin (LIPITOR) 40 MG tablet Take 1 tablet (40 mg total) by mouth daily at 6 PM. 30 tablet 9  . clopidogrel (PLAVIX) 75 MG tablet TAKE 1 TABLET BY MOUTH ONCE A DAY WITH BREAKFAST 30 tablet 10  .  estradiol (ESTRACE) 0.5 MG tablet Take 1 mg by mouth daily.     . isosorbide mononitrate (IMDUR) 60 MG 24 hr tablet TAKE 1 TABLET BY MOUTH ONCE A DAY 30 tablet 11  . lisinopril (PRINIVIL,ZESTRIL) 5 MG tablet TAKE 1 TABLET BY MOUTH DAILY. 90 tablet 3  . metoprolol tartrate (LOPRESSOR) 25 MG tablet TAKE 1 TABLET BY MOUTH TWICE A DAY 60 tablet 11  . nitroGLYCERIN (NITROSTAT) 0.4 MG SL tablet Place 1 tablet (0.4 mg total) under the tongue every 5 (five) minutes as needed for chest pain. 25 tablet 5  . pantoprazole (PROTONIX) 40 MG tablet TAKE 1 TABLET BY MOUTH TWICE A DAY 60 tablet 10  . RANEXA 1000 MG SR tablet TAKE 1 TABLET BY MOUTH 2 TIMES DAILY. 180 tablet 2  . [DISCONTINUED] phentermine 15 MG capsule Take 15 mg by mouth every morning.       No current facility-administered medications on file prior to visit.     Review of Systems:  As per HPI- otherwise negative. She has not been able to lose significant weight Feels tired most of the time Wt Readings from Last 3 Encounters:  01/12/17 217 lb 6.4 oz (98.6 kg)  11/11/16 222 lb 3.6 oz (100.8 kg)  09/30/16 218 lb (98.9 kg)    Physical Examination: Vitals:   01/12/17 1426  BP: 109/76  Pulse: 65  Temp: 98.2 F (36.8 C)   Vitals:   01/12/17 1426  Weight: 217 lb 6.4 oz (98.6 kg)  Height: 5\' 3"  (1.6 m)   Body mass index is 38.51 kg/m. Ideal Body Weight: Weight in (lb) to have BMI = 25:  140.8  GEN: WDWN, NAD, Non-toxic, A & O x 3   HEENT: Atraumatic, Normocephalic. Neck supple. No masses, No LAD.  Bilateral TM wnl, oropharynx normal.  PEERL,EOMI.   Nasal cavity is inflamed and somewhat congested  Ears and Nose: No external deformity. CV: RRR, No M/G/R. No JVD. No thrill. No extra heart sounds. PULM: CTA B, no wheezes, crackles, rhonchi. No retractions. No resp. distress. No accessory muscle use. ABD: S, NT, ND, +BS. No rebound. No HSM. EXTR: No c/c/e NEURO Normal gait.  PSYCH: Normally interactive. Conversant. Not depressed or anxious appearing.  Calm demeanor.    Assessment and Plan: Nasal congestion - Plan: amoxicillin (AMOXIL) 500 MG capsule  Essential hypertension - Plan: CBC  Chronic fatigue - Plan: Comprehensive metabolic panel  Sleep difficulties  Screening for deficiency anemia  Screening for hyperlipidemia - Plan: Lipid panel  History of hypothyroidism - Plan: TSH  Screening for diabetes mellitus - Plan: Hemoglobin A1c, Comprehensive metabolic panel  Here today with a few concerns Labs pending as above Encouraged her to try and get her sleep test scheduled and she will work on this Will plan further follow- up pending labs. Given rx for amoxicillin to hold- she will start if her sinuses do not feel better in the next few days - contact me if any concerns   Signed Lamar Blinks, MD  Received her labs  Results for orders placed or performed in visit on 01/12/17  CBC  Result Value Ref Range   WBC 7.4 4.0 - 10.5 K/uL   RBC 3.94 3.87 - 5.11 Mil/uL   Platelets 282.0 150.0 - 400.0 K/uL   Hemoglobin 12.3 12.0 - 15.0 g/dL   HCT 35.6 (L) 36.0 - 46.0 %   MCV 90.5 78.0 - 100.0 fl   MCHC 34.4 30.0 - 36.0 g/dL   RDW 13.1 11.5 -  15.5 %  Hemoglobin A1c  Result Value Ref Range   Hgb A1c MFr Bld 5.7 4.6 - 6.5 %  Lipid panel  Result Value Ref Range   Cholesterol 239 (H) 0 - 200 mg/dL   Triglycerides 247.0 (H) 0.0 - 149.0 mg/dL   HDL 56.40 >39.00  mg/dL   VLDL 49.4 (H) 0.0 - 40.0 mg/dL   Total CHOL/HDL Ratio 4    NonHDL 182.42   TSH  Result Value Ref Range   TSH 3.02 0.35 - 4.50 uIU/mL  Comprehensive metabolic panel  Result Value Ref Range   Sodium 133 (L) 135 - 145 mEq/L   Potassium 3.9 3.5 - 5.1 mEq/L   Chloride 98 96 - 112 mEq/L   CO2 27 19 - 32 mEq/L   Glucose, Bld 83 70 - 99 mg/dL   BUN 15 6 - 23 mg/dL   Creatinine, Ser 1.09 0.40 - 1.20 mg/dL   Total Bilirubin 0.3 0.2 - 1.2 mg/dL   Alkaline Phosphatase 72 39 - 117 U/L   AST 13 0 - 37 U/L   ALT 10 0 - 35 U/L   Total Protein 6.9 6.0 - 8.3 g/dL   Albumin 3.5 3.5 - 5.2 g/dL   Calcium 9.4 8.4 - 10.5 mg/dL   GFR 54.27 (L) >60.00 mL/min  LDL cholesterol, direct  Result Value Ref Range   Direct LDL 128.0 mg/dL   Estimated 10 year CV risk 7%.  Will sent letter to pt- could consider going up on her lipitor if she likes

## 2017-01-13 LAB — COMPREHENSIVE METABOLIC PANEL
ALK PHOS: 72 U/L (ref 39–117)
ALT: 10 U/L (ref 0–35)
AST: 13 U/L (ref 0–37)
Albumin: 3.5 g/dL (ref 3.5–5.2)
BILIRUBIN TOTAL: 0.3 mg/dL (ref 0.2–1.2)
BUN: 15 mg/dL (ref 6–23)
CO2: 27 meq/L (ref 19–32)
CREATININE: 1.09 mg/dL (ref 0.40–1.20)
Calcium: 9.4 mg/dL (ref 8.4–10.5)
Chloride: 98 mEq/L (ref 96–112)
GFR: 54.27 mL/min — ABNORMAL LOW (ref >60.00)
GLUCOSE: 83 mg/dL (ref 70–99)
Potassium: 3.9 mEq/L (ref 3.5–5.1)
Sodium: 133 mEq/L — ABNORMAL LOW (ref 135–145)
TOTAL PROTEIN: 6.9 g/dL (ref 6.0–8.3)

## 2017-01-13 LAB — LIPID PANEL
CHOLESTEROL: 239 mg/dL — AB (ref 0–200)
HDL: 56.4 mg/dL (ref >39.00)
NonHDL: 182.42
TRIGLYCERIDES: 247 mg/dL — AB (ref 0.0–149.0)
Total CHOL/HDL Ratio: 4
VLDL: 49.4 mg/dL — ABNORMAL HIGH (ref 0.0–40.0)

## 2017-01-13 LAB — CBC
HCT: 35.6 % — ABNORMAL LOW (ref 36.0–46.0)
Hemoglobin: 12.3 g/dL (ref 12.0–15.0)
MCHC: 34.4 g/dL (ref 30.0–36.0)
MCV: 90.5 fl (ref 78.0–100.0)
Platelets: 282 10*3/uL (ref 150.0–400.0)
RBC: 3.94 Mil/uL (ref 3.87–5.11)
RDW: 13.1 % (ref 11.5–15.5)
WBC: 7.4 10*3/uL (ref 4.0–10.5)

## 2017-01-13 LAB — HEMOGLOBIN A1C: HEMOGLOBIN A1C: 5.7 % (ref 4.6–6.5)

## 2017-01-13 LAB — LDL CHOLESTEROL, DIRECT: Direct LDL: 128 mg/dL

## 2017-01-13 LAB — TSH: TSH: 3.02 u[IU]/mL (ref 0.35–4.50)

## 2017-01-14 ENCOUNTER — Encounter (HOSPITAL_COMMUNITY): Payer: Federal, State, Local not specified - PPO

## 2017-01-14 DIAGNOSIS — R7303 Prediabetes: Secondary | ICD-10-CM | POA: Insufficient documentation

## 2017-01-17 ENCOUNTER — Encounter (HOSPITAL_COMMUNITY): Payer: Federal, State, Local not specified - PPO

## 2017-01-18 NOTE — Progress Notes (Signed)
Cardiac Individual Treatment Plan  Patient Details  Name: [name] MRN: [id number] Date of Birth: [birth year] Referring Provider:     CARDIAC REHAB PHASE II ORIENTATION from 11/11/2016 in [place]  Referring Provider  Quay Burow MD      Initial Encounter Date:    CARDIAC REHAB PHASE II ORIENTATION from 11/11/2016 in [place]  Date  11/11/16  Referring Provider  Quay Burow MD      Visit Diagnosis: Stable angina [facility])  Patient's Home Medications on Admission:  Current Outpatient Prescriptions:  .  ALPRAZolam (XANAX) 0.5 MG tablet, Take 0.5 mg by mouth daily. , Disp: , Rfl:  .  amLODipine (NORVASC) 5 MG tablet, Take 1 tablet (5 mg total) by mouth daily., Disp: 30 tablet, Rfl: 11 .  amoxicillin (AMOXIL) 500 MG capsule, Take 1 capsule (500 mg total) by mouth 3 (three) times daily., Disp: 30 capsule, Rfl: 0 .  aspirin EC 81 MG EC tablet, Take 1 tablet (81 mg total) by mouth daily., Disp: , Rfl:  .  atorvastatin (LIPITOR) 40 MG tablet, Take 1 tablet (40 mg total) by mouth daily at 6 PM., Disp: 30 tablet, Rfl: 9 .  clopidogrel (PLAVIX) 75 MG tablet, TAKE 1 TABLET BY MOUTH ONCE A DAY WITH BREAKFAST, Disp: 30 tablet, Rfl: 10 .  estradiol (ESTRACE) 0.5 MG tablet, Take 1 mg by mouth daily. , Disp: , Rfl:  .  isosorbide mononitrate (IMDUR) 60 MG 24 hr tablet, TAKE 1 TABLET BY MOUTH ONCE A DAY, Disp: 30 tablet, Rfl: 11 .  lisinopril (PRINIVIL,ZESTRIL) 5 MG tablet, TAKE 1 TABLET BY MOUTH DAILY., Disp: 90 tablet, Rfl: 3 .  metoprolol tartrate (LOPRESSOR) 25 MG tablet, TAKE 1 TABLET BY MOUTH TWICE A DAY, Disp: 60 tablet, Rfl: 11 .  nitroGLYCERIN (NITROSTAT) 0.4 MG SL tablet, Place 1 tablet (0.4 mg total) under the tongue every 5 (five) minutes as needed for chest pain., Disp: 25 tablet, Rfl: 5 .  pantoprazole (PROTONIX) 40 MG tablet, TAKE 1 TABLET BY MOUTH TWICE A DAY, Disp: 60 tablet, Rfl: 10 .  RANEXA 1000 MG SR  tablet, TAKE 1 TABLET BY MOUTH 2 TIMES DAILY., Disp: 180 tablet, Rfl: 2  Past Medical History: Past Medical History:  Diagnosis Date  . Angina effort ([place]) 05/22/2013  . Anxiety   . CAD (coronary artery disease), possible SCAD 12/14/2012  . Colon polyps   . Duodenitis   . Dyspnea on exertion    LEXISCAN, 09/30/2008 - normal, EKG negative for ischemia, no ECG changes  . Esophageal stricture   . Gastritis   . GERD (gastroesophageal reflux disease)   . Gout    "only from RX given after MI" (01/22/2013)  . H/O hiatal hernia   . History of esophageal stricture   . Hypertension    2D ECHO, 09/30/2008 - EF >55%, normal: RENAL DOPPLER, 03/21/2007 - normal duplex study  . Hypothyroidism   . IBS (irritable bowel syndrome)   . Ischemic colitis ([place])   . Lower GI bleeding    10/07/11 "I've had 3 episodes in the past year"  . Myocardial infarction [facility]) 12/10/2012   "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)  . Tubular adenoma polyp of rectum 2008    Tobacco Use: History  Smoking Status  . Never Smoker  Smokeless Tobacco  . Never Used    Labs: Recent Review Flowsheet Data    Labs for ITP Cardiac and Pulmonary Rehab Latest Ref Rng &  Units 04/15/2014 02/06/2015 02/07/2015 05/20/2015 01/12/2017   Cholestrol 0 - 200 mg/dL 184 - 265(H) 298(H) 239(H)   LDLCALC <130 mg/dL 92 - 167(H) 204(H) -   LDLDIRECT mg/dL - - - - 128.0   HDL >39.00 mg/dL 63 - 60 61 56.40   Trlycerides 0.0 - 149.0 mg/dL 145 - 192(H) 166(H) 247.0(H)   Hemoglobin A1c 4.6 - 6.5 % - 5.3 - - 5.7      Capillary Blood Glucose: Lab Results  Component Value Date   GLUCAP 97 12/12/2012   GLUCAP 91 12/12/2012   GLUCAP 81 10/03/2008     Exercise Target Goals:    Exercise Program Goal: Individual exercise prescription set with THRR, safety & activity barriers. Participant demonstrates ability to understand and report RPE using BORG scale, to self-measure pulse accurately, and to acknowledge the importance of the exercise  prescription.  Exercise Prescription Goal: Starting with aerobic activity 30 plus minutes a day, 3 days per week for initial exercise prescription. Provide home exercise prescription and guidelines that participant acknowledges understanding prior to discharge.  Activity Barriers & Risk Stratification:     Activity Barriers & Cardiac Risk Stratification - 11/11/16 0932      Activity Barriers & Cardiac Risk Stratification   Activity Barriers Deconditioning;Muscular Weakness   Cardiac Risk Stratification High      6 Minute Walk:     6 Minute Walk    Row Name 11/11/16 1239         6 Minute Walk   Phase Initial     Distance 1350 feet     Walk Time 6 minutes     # of Rest Breaks 0     MPH 2.6     METS 2.8     RPE 9     VO2 Peak 9.7     Symptoms No     Resting HR 72 bpm     Resting BP 118/82     Max Ex. HR 95 bpm     Max Ex. BP 138/84     2 Minute Post BP 102/80        Oxygen Initial Assessment:   Oxygen Re-Evaluation:   Oxygen Discharge (Final Oxygen Re-Evaluation):   Initial Exercise Prescription:     Initial Exercise Prescription - 11/11/16 1200      Date of Initial Exercise RX and Referring Provider   Date 11/11/16   Referring Provider Quay Burow MD     Treadmill   MPH 2   Grade 0   Minutes 10   METs 2.53     Bike   Level 0.6   Minutes 10   METs 2.13     NuStep   Level 1   Minutes 10   METs 1.8     Prescription Details   Frequency (times per week) 5   Duration Progress to 45 minutes of aerobic exercise without signs/symptoms of physical distress     Intensity   THRR 40-80% of Max Heartrate 64-128   Ratings of Perceived Exertion 11-13   Perceived Dyspnea 0-4     Progression   Progression Continue to progress workloads to maintain intensity without signs/symptoms of physical distress.     Resistance Training   Training Prescription Yes   Weight 2   Reps 10-15      Perform Capillary Blood Glucose checks as  needed.  Exercise Prescription Changes:     Exercise Prescription Changes    Row Name 11/22/16 1600 12/06/16 1000 12/22/16 1000  01/06/17 1200       Response to Exercise   Blood Pressure (Admit) 112/70 110/70 122/82 128/72    Blood Pressure (Exercise) 160/80 128/80 124/72 142/82    Blood Pressure (Exit) 112/70 116/78 104/62 114/70    Heart Rate (Admit) 93 bpm 65 bpm 68 bpm 70 bpm    Heart Rate (Exercise) 100 bpm 100 bpm 104 bpm 96 bpm    Heart Rate (Exit) 91 bpm 80 bpm 66 bpm 70 bpm    Rating of Perceived Exertion (Exercise) 13 13 14 12     Symptoms none none none none    Comments Pt is tolerating exercise in cardiac rehab very well; will continue to monitor pt's progress adjusted Ex Rx. pt moved from airdyne bike to recumbent bike due to comfort  -  -    Duration Continue with 30 min of aerobic exercise without signs/symptoms of physical distress. Continue with 30 min of aerobic exercise without signs/symptoms of physical distress. Continue with 30 min of aerobic exercise without signs/symptoms of physical distress. Continue with 30 min of aerobic exercise without signs/symptoms of physical distress.    Intensity THRR unchanged THRR unchanged THRR unchanged THRR unchanged      Progression   Progression  - Continue to progress workloads to maintain intensity without signs/symptoms of physical distress. Continue to progress workloads to maintain intensity without signs/symptoms of physical distress. Continue to progress workloads to maintain intensity without signs/symptoms of physical distress.    Average METs  - 3 3.6 3.6      Resistance Training   Training Prescription Yes Yes Yes Yes    Weight 2 3lbs 3lbs 3lbs    Reps 10-15 10-15 10-15 10-15    Time 10 Minutes 10 Minutes 10 Minutes 10 Minutes      Treadmill   MPH 2.5 2.8 2.9 2.9    Grade 0 1 2 2     Minutes 10 10 10 10     METs 2.91 3.53 4.02 4.02      Bike   Level 0.6  -  -  -    Minutes 10  -  -  -    METs 2.13  -  -  -       Recumbant Bike   Level  - 2.5 3.4  -    [place]  -  - 42  -    Minutes  - 10 10  -    METs  - 2.5 3.2  -      NuStep   Level 3 3 5 5     Minutes 10 10 10 10     METs 2.4 3 4  3.1      Arm Ergometer   Level  -  -  - 1    Minutes  -  -  - 10      Home Exercise Plan   Plans to continue exercise at  - [employer] (comment)  [place] (comment)  [place] (comment)  [employer]    Frequency  - Add 3 additional days to program exercise sessions. Add 3 additional days to program exercise sessions. Add 3 additional days to program exercise sessions.    Initial Home Exercises Provided  - 11/29/16 11/29/16 11/29/16       Exercise Comments:     Exercise Comments    Row Name 11/23/16 1343 12/15/16 1053 01/17/17 1105       Exercise Comments Reviewed METs and activity levels with pt. Pt is tolerating  exercise very well; will continue to monitor exercise progression. Reviewed METs and activity levels with pt. Pt is tolerating exercise very well; will continue to monitor exercise progression. Reviewed METs and activity levels with pt. Pt is tolerating exercise very well; will continue to monitor exercise progression.        Exercise Goals and Review:     Exercise Goals    Row Name 11/11/16 0936 11/23/16 1340           Exercise Goals   Increase Physical Activity (P)  Yes  Build confidence with functional acitivites and ADL's Yes      Intervention (P)  Provide advice, education, support and counseling about physical activity/exercise needs.;Develop an individualized exercise prescription for aerobic and resistive training based on initial evaluation findings, risk stratification, comorbidities and participant's personal goals. Provide advice, education, support and counseling about physical activity/exercise needs.;Develop an individualized exercise prescription for aerobic and resistive training based on initial evaluation findings, risk stratification,  comorbidities and participant's personal goals.      Expected Outcomes (P)  Achievement of increased cardiorespiratory fitness and enhanced flexibility, muscular endurance and strength shown through measurements of functional capacity and personal statement of participant. Achievement of increased cardiorespiratory fitness and enhanced flexibility, muscular endurance and strength shown through measurements of functional capacity and personal statement of participant.      Increase Strength and Stamina (P)  Yes  Be able to climb stairs and uphill without fatigue and SOB Yes      Intervention (P)  Provide advice, education, support and counseling about physical activity/exercise needs.;Develop an individualized exercise prescription for aerobic and resistive training based on initial evaluation findings, risk stratification, comorbidities and participant's personal goals. Provide advice, education, support and counseling about physical activity/exercise needs.;Develop an individualized exercise prescription for aerobic and resistive training based on initial evaluation findings, risk stratification, comorbidities and participant's personal goals.      Expected Outcomes (P)  Achievement of increased cardiorespiratory fitness and enhanced flexibility, muscular endurance and strength shown through measurements of functional capacity and personal statement of participant. Achievement of increased cardiorespiratory fitness and enhanced flexibility, muscular endurance and strength shown through measurements of functional capacity and personal statement of participant.         Exercise Goals Re-Evaluation :     Exercise Goals Re-Evaluation    Row Name 11/23/16 1340 11/29/16 0943 12/15/16 1029         Exercise Goal Re-Evaluation   Exercise Goals Review Increase Physical Activity;Increase Strenth and Stamina Increase Physical Activity;Increase Strenth and Stamina Increase Physical Activity;Increase Strenth  and Stamina     Comments Pt is tolerating exercise very well in cardiac rehab. Reviewed home exercise with pt today.  Pt plans to go the [facility] for exercise and use exercise equipment 2x/week in addition to coming to cardiac rehab.  Reviewed THR, pulse, RPE, sign and symptoms, NTG use, and when to call 911 or MD.  Also discussed weather considerations and indoor options.  Pt voiced understanding. Pt stated "having more energy/stamina" and "able to perform ADL's without difficulty or fatigue." Pt is exercising at [employer] 2-3x/week for 30 minutes     Expected Outcomes Pt will continue to come 3x/week to cardiac rehab to work on increasing cardiorespiratory fitness Pt will be compliant with HEP and continue to improve in cardiorespiratory fitness Pt will be compliant with HEP and continue to improve in cardiorespiratory fitness         Discharge Exercise Prescription (Final Exercise Prescription Changes):  Exercise Prescription Changes - 01/06/17 1200      Response to Exercise   Blood Pressure (Admit) 128/72   Blood Pressure (Exercise) 142/82   Blood Pressure (Exit) 114/70   Heart Rate (Admit) 70 bpm   Heart Rate (Exercise) 96 bpm   Heart Rate (Exit) 70 bpm   Rating of Perceived Exertion (Exercise) 12   Symptoms none   Duration Continue with 30 min of aerobic exercise without signs/symptoms of physical distress.   Intensity THRR unchanged     Progression   Progression Continue to progress workloads to maintain intensity without signs/symptoms of physical distress.   Average METs 3.6     Resistance Training   Training Prescription Yes   Weight 3lbs   Reps 10-15   Time 10 Minutes     Treadmill   MPH 2.9   Grade 2   Minutes 10   METs 4.02     NuStep   Level 5   Minutes 10   METs 3.1     Arm Ergometer   Level 1   Minutes 10     Home Exercise Plan   Plans to continue exercise at [employer] (comment)  [employer]   Frequency Add 3 additional days to program  exercise sessions.   Initial Home Exercises Provided 11/29/16      Nutrition:  Target Goals: Understanding of nutrition guidelines, daily intake of sodium 1500mg , cholesterol 200mg , calories 30% from fat and 7% or less from saturated fats, daily to have 5 or more servings of fruits and vegetables.  Biometrics:     Pre Biometrics - 11/11/16 1245      Pre Biometrics   Waist Circumference 40.25 inches   Hip Circumference 52 inches   Waist to Hip Ratio 0.77 %   Triceps Skinfold 52 mm   % Body Fat 51.1 %   Grip Strength 35 kg   Flexibility 10 in   Single Leg Stand 8.03 seconds       Nutrition Therapy Plan and Nutrition Goals:     Nutrition Therapy & Goals - 12/06/16 0953      Nutrition Therapy   Diet Therapeutic Lifestyle Changes     Personal Nutrition Goals   Nutrition Goal Wt loss of 1-2 lb/week to a wt loss goal of 6-24 lb at graduation from [place], educate and counsel regarding individualized specific dietary modifications aiming towards targeted core components such as weight, hypertension, lipid management, diabetes, heart failure and other comorbidities.;Nutrition handout(s) given to patient.  1500 kcal, 5 day menu ideas   Expected Outcomes Short Term Goal: Understand basic principles of dietary content, such as calories, fat, sodium, cholesterol and nutrients.;Long Term Goal: Adherence to prescribed nutrition plan.      Nutrition Discharge: Nutrition Scores:     Nutrition Assessments - 12/06/16 0953      MEDFICTS Scores   Pre Score 28      Nutrition Goals Re-Evaluation:   Nutrition Goals Re-Evaluation:   Nutrition Goals Discharge (Final Nutrition Goals Re-Evaluation):   Psychosocial: Target Goals: Acknowledge presence or absence of significant depression and/or stress, maximize coping skills, provide positive support system. Participant is able to verbalize types and ability to use techniques  and skills needed for reducing stress and depression.  Initial Review & Psychosocial Screening:     Initial Psych Review & Screening - 11/15/16 1105      Initial Review   Current issues with Current Stress Concerns  Source of Stress Concerns Chronic Illness;Unable to participate in former interests or hobbies     [place]? Yes     Barriers   Psychosocial barriers to participate in program There are no identifiable barriers or psychosocial needs.     Screening Interventions   Interventions Encouraged to exercise      Quality of Life Scores:     Quality of Life - 11/29/16 0918      Quality of Life Scores   Health/Function Pre 18.96 %  pt concerned about recent cardiac event and fatigue. pt has fears and worries about living with CAD.  pt is looking forward to resuming usual activities however is uncertain she can.  pt feels her fatigue is related to her thyroid.     Socioeconomic Pre 27.83 %   Psych/Spiritual Pre 14.57 %  pt displays health related anxiety from recent lifestyle changes from cardiac event.  Fatigue has resulted in decreased ability to participate in pleasurable activities.     Family Pre 22.5 %   GLOBAL Pre 20.15 %  pt offered reassurance and emotional support       PHQ-9: Recent Review Flowsheet Data    Depression screen [facility] 2/9 11/15/2016   Decreased Interest 0   Down, Depressed, Hopeless 0   PHQ - 2 Score 0     Interpretation of Total Score  Total Score Depression Severity:  1-4 = Minimal depression, 5-9 = Mild depression, 10-14 = Moderate depression, 15-19 = Moderately severe depression, 20-27 = Severe depression   Psychosocial Evaluation and Intervention:     Psychosocial Evaluation - 11/15/16 1107      Psychosocial Evaluation & Interventions   Interventions Encouraged to exercise with the program and follow exercise prescription;Stress management education;Relaxation education   Comments health related anxiety     Expected Outcomes pt will demonstrate positive outlook wtih good coping skills.    Continue Psychosocial Services  No Follow up required      Psychosocial Re-Evaluation:     Psychosocial Re-Evaluation    [place] Name 11/24/16 1713 12/22/16 1528 01/10/17 1728         Psychosocial Re-Evaluation   Current issues with Current Anxiety/Panic Current Anxiety/Panic Current Anxiety/Panic     Comments pt with health related anxiety is tolerating CR participation without difficulty.  exhibits positive interactions with staff and class mates.  pt with health related anxiety is tolerating CR participation without difficulty.  exhibits positive interactions with staff and class mates.  pt with health related anxiety is tolerating CR participation without difficulty.  exhibits positive interactions with staff and class mates.      Expected Outcomes pt will exhibit positive outlook with good coping skills.  pt will exhibit positive outlook with good coping skills.  pt will exhibit positive outlook with good coping skills.      Interventions Encouraged to attend Cardiac Rehabilitation for the exercise;Stress management education;Relaxation education Encouraged to attend Cardiac Rehabilitation for the exercise;Stress management education;Relaxation education Encouraged to attend Cardiac Rehabilitation for the exercise;Stress management education;Relaxation education     Continue Psychosocial Services  Follow up required by staff Follow up required by staff Follow up required by staff        Psychosocial Discharge (Final Psychosocial Re-Evaluation):     Psychosocial Re-Evaluation - 01/10/17 1728      Psychosocial Re-Evaluation   Current issues with Current Anxiety/Panic   Comments pt with health related anxiety is tolerating CR participation without difficulty.  exhibits positive  interactions with staff and class mates.    Expected Outcomes pt will exhibit positive outlook with good coping skills.     Interventions Encouraged to attend Cardiac Rehabilitation for the exercise;Stress management education;Relaxation education   Continue Psychosocial Services  Follow up required by staff      Vocational Rehabilitation: Provide vocational rehab assistance to qualifying candidates.   Vocational Rehab Evaluation & Intervention:     Vocational Rehab - 11/11/16 1658      Initial Vocational Rehab Evaluation & Intervention   Assessment shows need for Vocational Rehabilitation No      Education: Education Goals: Education classes will be provided on a weekly basis, covering required topics. Participant will state understanding/return demonstration of topics presented.  Learning Barriers/Preferences:     Learning Barriers/Preferences - 11/11/16 0935      Learning Barriers/Preferences   Learning Barriers Sight   Learning Preferences Written Material;Skilled Demonstration      Education Topics: Count Your Pulse:  -Group instruction provided by verbal instruction, demonstration, patient participation and written materials to support subject.  Instructors address importance of being able to find your pulse and how to count your pulse when at home without a heart monitor.  Patients get hands on experience counting their pulse with staff help and individually.   Heart Attack, Angina, and Risk Factor Modification:  -Group instruction provided by verbal instruction, video, and written materials to support subject.  Instructors address signs and symptoms of angina and heart attacks.    Also discuss risk factors for heart disease and how to make changes to improve heart health risk factors.   CARDIAC REHAB PHASE II EXERCISE from 01/05/2017 in [place]  Date  12/22/16  Instruction Review Code  2- meets goals/outcomes      Functional Fitness:  -Group instruction provided by verbal instruction, demonstration, patient participation, and written materials to  support subject.  Instructors address safety measures for doing things around the house.  Discuss how to get up and down off the floor, how to pick things up properly, how to safely get out of a chair without assistance, and balance training.   Meditation and Mindfulness:  -Group instruction provided by verbal instruction, patient participation, and written materials to support subject.  Instructor addresses importance of mindfulness and meditation practice to help reduce stress and improve awareness.  Instructor also leads participants through a meditation exercise.    CARDIAC REHAB PHASE II EXERCISE from 01/05/2017 in [place]  Date  01/05/17  Instruction Review Code  2- meets goals/outcomes      Stretching for Flexibility and Mobility:  -Group instruction provided by verbal instruction, patient participation, and written materials to support subject.  Instructors lead participants through series of stretches that are designed to increase flexibility thus improving mobility.  These stretches are additional exercise for major muscle groups that are typically performed during regular warm up and cool down.   Hands Only CPR:  -Group verbal, video, and participation provides a basic overview of AHA guidelines for community CPR. Role-play of emergencies allow participants the opportunity to practice calling for help and chest compression technique with discussion of AED use.   Hypertension: -Group verbal and written instruction that provides a basic overview of hypertension including the most recent diagnostic guidelines, risk factor reduction with self-care instructions and medication management.    Nutrition I class: Heart Healthy Eating:  -Group instruction provided by PowerPoint slides, verbal discussion, and written materials to support  subject matter. The instructor gives an explanation and review of the Therapeutic Lifestyle Changes diet recommendations,  which includes a discussion on lipid goals, dietary fat, sodium, fiber, plant stanol/sterol esters, sugar, and the components of a well-balanced, healthy diet.   Nutrition II class: Lifestyle Skills:  -Group instruction provided by PowerPoint slides, verbal discussion, and written materials to support subject matter. The instructor gives an explanation and review of label reading, grocery shopping for heart health, heart healthy recipe modifications, and ways to make healthier choices when eating out.   Diabetes Question & Answer:  -Group instruction provided by PowerPoint slides, verbal discussion, and written materials to support subject matter. The instructor gives an explanation and review of diabetes co-morbidities, pre- and post-prandial blood glucose goals, pre-exercise blood glucose goals, signs, symptoms, and treatment of hypoglycemia and hyperglycemia, and foot care basics.   Diabetes Blitz:  -Group instruction provided by PowerPoint slides, verbal discussion, and written materials to support subject matter. The instructor gives an explanation and review of the physiology behind type 1 and type 2 diabetes, diabetes medications and rational behind using different medications, pre- and post-prandial blood glucose recommendations and Hemoglobin A1c goals, diabetes diet, and exercise including blood glucose guidelines for exercising safely.    Portion Distortion:  -Group instruction provided by PowerPoint slides, verbal discussion, written materials, and food models to support subject matter. The instructor gives an explanation of serving size versus portion size, changes in portions sizes over the last 20 years, and what consists of a serving from each food group.   CARDIAC REHAB PHASE II EXERCISE from 01/05/2017 in [place]  Date  11/25/16  Educator  RD  Instruction Review Code  2- meets goals/outcomes      Stress Management:  -Group instruction  provided by verbal instruction, video, and written materials to support subject matter.  Instructors review role of stress in heart disease and how to cope with stress positively.     CARDIAC REHAB PHASE II EXERCISE from 01/05/2017 in [place]  Date  12/01/16  Instruction Review Code  2- meets goals/outcomes      Exercising on Your Own:  -Group instruction provided by verbal instruction, power point, and written materials to support subject.  Instructors discuss benefits of exercise, components of exercise, frequency and intensity of exercise, and end points for exercise.  Also discuss use of nitroglycerin and activating EMS.  Review options of places to exercise outside of rehab.  Review guidelines for sex with heart disease.   CARDIAC REHAB PHASE II EXERCISE from 01/05/2017 in [place]  Date  12/15/16  Instruction Review Code  2- meets goals/outcomes      Cardiac Drugs I:  -Group instruction provided by verbal instruction and written materials to support subject.  Instructor reviews cardiac drug classes: antiplatelets, anticoagulants, beta blockers, and statins.  Instructor discusses reasons, side effects, and lifestyle considerations for each drug class.   Cardiac Drugs II:  -Group instruction provided by verbal instruction and written materials to support subject.  Instructor reviews cardiac drug classes: angiotensin converting enzyme inhibitors (ACE-I), angiotensin II receptor blockers (ARBs), nitrates, and calcium channel blockers.  Instructor discusses reasons, side effects, and lifestyle considerations for each drug class.   CARDIAC REHAB PHASE II EXERCISE from 01/05/2017 in [place]  Date  12/08/16  Educator  Kennyth Lose  Instruction Review Code  2- meets goals/outcomes      Anatomy and  Physiology of the Circulatory System:  Group verbal and written instruction and models provide basic  cardiac anatomy and physiology, with the coronary electrical and arterial systems. Review of: AMI, Angina, Valve disease, Heart Failure, Peripheral Artery Disease, Cardiac Arrhythmia, Pacemakers, and the ICD.   Other Education:  -Group or individual verbal, written, or video instructions that support the educational goals of the cardiac rehab program.   Knowledge Questionnaire Score:     Knowledge Questionnaire Score - 11/11/16 1238      Knowledge Questionnaire Score   Pre Score 18/24      Core Components/Risk Factors/Patient Goals at Admission:     Personal Goals and Risk Factors at Admission - 11/11/16 1654      Core Components/Risk Factors/Patient Goals on Admission    Weight Management Yes   Intervention Weight Management: Develop a combined nutrition and exercise program designed to reach desired caloric intake, while maintaining appropriate intake of nutrient and fiber, sodium and fats, and appropriate energy expenditure required for the weight goal.;Weight Management: Provide education and appropriate resources to help participant work on and attain dietary goals.;Weight Management/Obesity: Establish reasonable short term and long term weight goals.;Obesity: Provide education and appropriate resources to help participant work on and attain dietary goals.   Expected Outcomes Short Term: Continue to assess and modify interventions until short term weight is achieved;Long Term: Adherence to nutrition and physical activity/exercise program aimed toward attainment of established weight goal;Weight Loss: Understanding of general recommendations for a balanced deficit meal plan, which promotes 1-2 lb weight loss per week and includes a negative energy balance of [phone] kcal/d;Weight Maintenance: Understanding of the daily nutrition guidelines, which includes 25-35% calories from fat, 7% or less cal from saturated fats, less than 200mg  cholesterol, less than 1.5gm of sodium, & 5 or more  servings of fruits and vegetables daily;Understanding recommendations for meals to include 15-35% energy as protein, 25-35% energy from fat, 35-60% energy from carbohydrates, less than 200mg  of dietary cholesterol, 20-35 gm of total fiber daily;Understanding of distribution of calorie intake throughout the day with the consumption of 4-5 meals/snacks   Hypertension Yes   Intervention Provide education on lifestyle modifcations including regular physical activity/exercise, weight management, moderate sodium restriction and increased consumption of fresh fruit, vegetables, and low fat dairy, alcohol moderation, and smoking cessation.;Monitor prescription use compliance.   Expected Outcomes Short Term: Continued assessment and intervention until BP is < 140/27mm HG in hypertensive participants. < 130/83mm HG in hypertensive participants with diabetes, heart failure or chronic kidney disease.;Long Term: Maintenance of blood pressure at goal levels.   Lipids Yes   Intervention Provide education and support for participant on nutrition & aerobic/resistive exercise along with prescribed medications to achieve LDL 70mg , HDL >40mg .   Expected Outcomes Short Term: Participant states understanding of desired cholesterol values and is compliant with medications prescribed. Participant is following exercise prescription and nutrition guidelines.;Long Term: Cholesterol controlled with medications as prescribed, with individualized exercise RX and with personalized nutrition plan. Value goals: LDL < 70mg , HDL > 40 mg.      Core Components/Risk Factors/Patient Goals Review:      Goals and Risk Factor Review    Row Name 11/24/16 1711 12/22/16 1526 01/10/17 1726         Core Components/Risk Factors/Patient Goals Review   Personal Goals Review Weight Management/Obesity;Lipids;Hypertension Weight Management/Obesity;Lipids;Hypertension Weight Management/Obesity;Lipids;Hypertension     Review pt exhibits positive  outlook about CR participation.  pt exhibits positive outlook about CR participation. pt is currently exercising on  her own at [facility].  pt is eager to resume water aerobics. pt is also eager to lose weight and expresses disappointment she has not lost weight yet. pt encouraged to continue nutrition and exercise guidelines to facilitate weight loss goals.  pt exhibits positive outlook about CR participation. pt is currently exercising on her own at [facility].  pt is concerend about persistent fatigue being thyroid etiology, she will review with her PCP.   pt is eager to lose weight and expresses disappointment she has not lost weight yet., although she is controlling porition sizes and content.   pt encouraged to continue nutrition and exercise guidelines to facilitate weight loss goals.      Expected Outcomes pt will participate in cardiac rehab exercise, nutrition and lifestyle modification education classes to decrease overall CAD risk factors.  pt will participate in cardiac rehab exercise, nutrition and lifestyle modification education classes to decrease overall CAD risk factors.  pt will participate in cardiac rehab exercise, nutrition and lifestyle modification education classes to decrease overall CAD risk factors.         Core Components/Risk Factors/Patient Goals at Discharge (Final Review):      Goals and Risk Factor Review - 01/10/17 1726      Core Components/Risk Factors/Patient Goals Review   Personal Goals Review Weight Management/Obesity;Lipids;Hypertension   Review pt exhibits positive outlook about CR participation. pt is currently exercising on her own at [facility].  pt is concerend about persistent fatigue being thyroid etiology, she will review with her PCP.   pt is eager to lose weight and expresses disappointment she has not lost weight yet., although she is controlling porition sizes and content.   pt encouraged to continue nutrition and exercise guidelines to  facilitate weight loss goals.    Expected Outcomes pt will participate in cardiac rehab exercise, nutrition and lifestyle modification education classes to decrease overall CAD risk factors.       ITP Comments:     ITP Comments    Row Name 11/11/16 0932 01/18/17 1549         ITP Comments Medical Director, Dr. Fransico Him Medical Director, Dr. Fransico Him         Comments: Pt is making expected progress toward personal goals after completing 15 sessions. Recommend continued exercise and life style modification education including  stress management and relaxation techniques to decrease cardiac risk profile.

## 2017-01-19 ENCOUNTER — Encounter (HOSPITAL_COMMUNITY)
Admission: RE | Admit: 2017-01-19 | Discharge: 2017-01-19 | Disposition: A | Discharge status: Home / Self Care | Payer: Federal, State, Local not specified - PPO | Source: Ambulatory Visit | Attending: Cardiovascular Disease | Admitting: Cardiovascular Disease

## 2017-01-19 DIAGNOSIS — E039 Hypothyroidism, unspecified: Secondary | ICD-10-CM | POA: Diagnosis not present

## 2017-01-19 DIAGNOSIS — I208 Other forms of angina pectoris: Secondary | ICD-10-CM

## 2017-01-19 DIAGNOSIS — I252 Old myocardial infarction: Secondary | ICD-10-CM | POA: Diagnosis not present

## 2017-01-19 DIAGNOSIS — I25118 Atherosclerotic heart disease of native coronary artery with other forms of angina pectoris: Secondary | ICD-10-CM | POA: Diagnosis not present

## 2017-01-19 DIAGNOSIS — K219 Gastro-esophageal reflux disease without esophagitis: Secondary | ICD-10-CM | POA: Diagnosis not present

## 2017-01-19 DIAGNOSIS — Z7902 Long term (current) use of antithrombotics/antiplatelets: Secondary | ICD-10-CM | POA: Diagnosis not present

## 2017-01-19 DIAGNOSIS — F419 Anxiety disorder, unspecified: Secondary | ICD-10-CM | POA: Diagnosis not present

## 2017-01-19 DIAGNOSIS — I10 Essential (primary) hypertension: Secondary | ICD-10-CM | POA: Diagnosis not present

## 2017-01-19 DIAGNOSIS — K589 Irritable bowel syndrome without diarrhea: Secondary | ICD-10-CM | POA: Diagnosis not present

## 2017-01-19 DIAGNOSIS — E109 Type 1 diabetes mellitus without complications: Secondary | ICD-10-CM | POA: Diagnosis not present

## 2017-01-19 DIAGNOSIS — Z7982 Long term (current) use of aspirin: Secondary | ICD-10-CM | POA: Diagnosis not present

## 2017-01-19 DIAGNOSIS — Z79899 Other long term (current) drug therapy: Secondary | ICD-10-CM | POA: Diagnosis not present

## 2017-01-21 ENCOUNTER — Encounter (HOSPITAL_COMMUNITY): Payer: Federal, State, Local not specified - PPO

## 2017-01-24 ENCOUNTER — Telehealth: Payer: Self-pay | Admitting: Cardiovascular Disease

## 2017-01-24 ENCOUNTER — Encounter (HOSPITAL_COMMUNITY)
Admission: RE | Admit: 2017-01-24 | Discharge: 2017-01-24 | Disposition: A | Discharge status: Home / Self Care | Payer: Federal, State, Local not specified - PPO | Source: Ambulatory Visit | Attending: Cardiovascular Disease | Admitting: Cardiovascular Disease

## 2017-01-24 DIAGNOSIS — I2089 Other forms of angina pectoris: Secondary | ICD-10-CM

## 2017-01-24 DIAGNOSIS — Z79899 Other long term (current) drug therapy: Secondary | ICD-10-CM | POA: Diagnosis not present

## 2017-01-24 DIAGNOSIS — Z7902 Long term (current) use of antithrombotics/antiplatelets: Secondary | ICD-10-CM | POA: Diagnosis not present

## 2017-01-24 DIAGNOSIS — I208 Other forms of angina pectoris: Secondary | ICD-10-CM

## 2017-01-24 DIAGNOSIS — K589 Irritable bowel syndrome without diarrhea: Secondary | ICD-10-CM | POA: Diagnosis not present

## 2017-01-24 DIAGNOSIS — I252 Old myocardial infarction: Secondary | ICD-10-CM | POA: Diagnosis not present

## 2017-01-24 DIAGNOSIS — I25118 Atherosclerotic heart disease of native coronary artery with other forms of angina pectoris: Secondary | ICD-10-CM | POA: Diagnosis not present

## 2017-01-24 DIAGNOSIS — I10 Essential (primary) hypertension: Secondary | ICD-10-CM | POA: Diagnosis not present

## 2017-01-24 DIAGNOSIS — E039 Hypothyroidism, unspecified: Secondary | ICD-10-CM | POA: Diagnosis not present

## 2017-01-24 DIAGNOSIS — K219 Gastro-esophageal reflux disease without esophagitis: Secondary | ICD-10-CM | POA: Diagnosis not present

## 2017-01-24 DIAGNOSIS — E109 Type 1 diabetes mellitus without complications: Secondary | ICD-10-CM | POA: Diagnosis not present

## 2017-01-24 DIAGNOSIS — K080 Exfoliation of teeth due to systemic causes: Secondary | ICD-10-CM | POA: Diagnosis not present

## 2017-01-24 DIAGNOSIS — F419 Anxiety disorder, unspecified: Secondary | ICD-10-CM | POA: Diagnosis not present

## 2017-01-24 DIAGNOSIS — Z7982 Long term (current) use of aspirin: Secondary | ICD-10-CM | POA: Diagnosis not present

## 2017-01-24 NOTE — Progress Notes (Signed)
[name] 61 y.o. female Nutrition Note Spoke with pt. Pt concerned re: being diagnosed with pre-DM at her MD appointment. Pt has a strong family h/o DM. Delaying development of DM via diet, exercise, and wt loss discussed. Pt states she is now exercising on her non-rehab days. Pt wants to lose wt and has been trying to lose wt by decreasing portion sizes consumed. Pt has lost 5 lb since starting rehab. Pt continues to not be interested in measuring food for a food journal or counting calories. Pt encouraged to consider writing down what she has eaten to increase awareness of food consumed. Pt expressed understanding of the information reviewed. Pt aware of nutrition education classes offered and plans on attending nutrition classes. Lab Results  Component Value Date   HGBA1C 5.7 01/12/2017   Wt Readings from Last 3 Encounters:  01/12/17 217 lb 6.4 oz (98.6 kg)  11/11/16 222 lb 3.6 oz (100.8 kg)  09/30/16 218 lb (98.9 kg)   Nutrition Diagnosis ? Food-and nutrition-related knowledge deficit related to lack of exposure to information as related to diagnosis of: ? CVD ? Obesity related to excessive energy intake as evidenced by a BMI of 40.7 Nutrition Intervention ? Nutrition Plan reviewed ? Pt to attend the Portion Distortion class - met 01/19/17 ? Pt to attend the  ? Nutrition I class                      ? Nutrition II class ? Continue client-centered nutrition education by RD, as part of interdisciplinary care.  Goal(s) ? Pt to identify food quantities necessary to achieve weight loss of 6-24 lb (2.7-10.9 kg) at graduation from cardiac rehab.   Monitor and Evaluate progress toward nutrition goal with team.  Derek Mound, M.Ed, RD, LDN, CDE 01/24/2017 10:30 AM

## 2017-01-24 NOTE — Telephone Encounter (Signed)
[id number] 

## 2017-01-24 NOTE — Telephone Encounter (Signed)
Please call,she needs to talk to you about her medication.

## 2017-01-25 NOTE — Telephone Encounter (Signed)
Spoke with pt, a couple months ago she stopped all the pm medications she takes because she was waking up with nausea and metal taste in her mouth. She no longer has those symptoms. She had stopped the metoprolol, ranexa, protonix and atorvastatin pm doses only. Advised patient to restart the ranexa and metoprolol pm dose because she has had no symptoms during the day taking the morning dose. She had lab work done with her medical doctor, she is concerned about her cholesterol and kidney function. She would like to take something different than the lipitor. Will forward to dr berry to review labs in [facility] and recommend another cholesterol medication besides lipitor. Patient reports she has not taken any other statins.

## 2017-01-25 NOTE — Telephone Encounter (Signed)
Left message for pt to call.

## 2017-01-25 NOTE — Telephone Encounter (Signed)
°  Follow Up ° °Returning call from yesterday. Please call. °

## 2017-01-26 ENCOUNTER — Encounter (HOSPITAL_COMMUNITY)
Admission: RE | Admit: 2017-01-26 | Discharge: 2017-01-26 | Disposition: A | Discharge status: Home / Self Care | Payer: Federal, State, Local not specified - PPO | Source: Ambulatory Visit | Attending: Cardiovascular Disease | Admitting: Cardiovascular Disease

## 2017-01-26 DIAGNOSIS — I208 Other forms of angina pectoris: Secondary | ICD-10-CM

## 2017-01-26 DIAGNOSIS — K219 Gastro-esophageal reflux disease without esophagitis: Secondary | ICD-10-CM | POA: Diagnosis not present

## 2017-01-26 DIAGNOSIS — I252 Old myocardial infarction: Secondary | ICD-10-CM | POA: Diagnosis not present

## 2017-01-26 DIAGNOSIS — Z7982 Long term (current) use of aspirin: Secondary | ICD-10-CM | POA: Diagnosis not present

## 2017-01-26 DIAGNOSIS — E039 Hypothyroidism, unspecified: Secondary | ICD-10-CM | POA: Diagnosis not present

## 2017-01-26 DIAGNOSIS — Z7902 Long term (current) use of antithrombotics/antiplatelets: Secondary | ICD-10-CM | POA: Diagnosis not present

## 2017-01-26 DIAGNOSIS — I10 Essential (primary) hypertension: Secondary | ICD-10-CM | POA: Diagnosis not present

## 2017-01-26 DIAGNOSIS — I25118 Atherosclerotic heart disease of native coronary artery with other forms of angina pectoris: Secondary | ICD-10-CM | POA: Diagnosis not present

## 2017-01-26 DIAGNOSIS — E109 Type 1 diabetes mellitus without complications: Secondary | ICD-10-CM | POA: Diagnosis not present

## 2017-01-26 DIAGNOSIS — K589 Irritable bowel syndrome without diarrhea: Secondary | ICD-10-CM | POA: Diagnosis not present

## 2017-01-26 DIAGNOSIS — Z79899 Other long term (current) drug therapy: Secondary | ICD-10-CM | POA: Diagnosis not present

## 2017-01-26 DIAGNOSIS — F419 Anxiety disorder, unspecified: Secondary | ICD-10-CM | POA: Diagnosis not present

## 2017-01-26 NOTE — Progress Notes (Signed)
[name] 61 y.o. female  1956-06-28 Nutrition Education   Spoke with pt. Pt c/o pre-DM diagnosis and need to lose wt. Pt educated re: what carbohydrates are and what foods have carbs as well as food label reading. Pt given 1500 kcal, 5 day menu ideas, which were personalized with pt. Continue client-centered nutrition education by RD as part of interdisciplinary care.  Monitor and evaluate progress toward nutrition goal with team.  Derek Mound, M.Ed, RD, LDN, CDE 01/26/2017 12:14 PM

## 2017-01-28 ENCOUNTER — Encounter (HOSPITAL_COMMUNITY): Payer: Federal, State, Local not specified - PPO

## 2017-01-31 ENCOUNTER — Encounter (HOSPITAL_COMMUNITY)
Admission: RE | Admit: 2017-01-31 | Discharge: 2017-01-31 | Disposition: A | Discharge status: Home / Self Care | Payer: Federal, State, Local not specified - PPO | Source: Ambulatory Visit | Attending: Cardiovascular Disease | Admitting: Cardiovascular Disease

## 2017-01-31 ENCOUNTER — Telehealth: Payer: Self-pay | Admitting: Cardiovascular Disease

## 2017-01-31 DIAGNOSIS — I252 Old myocardial infarction: Secondary | ICD-10-CM | POA: Insufficient documentation

## 2017-01-31 DIAGNOSIS — I25118 Atherosclerotic heart disease of native coronary artery with other forms of angina pectoris: Secondary | ICD-10-CM | POA: Insufficient documentation

## 2017-01-31 DIAGNOSIS — K589 Irritable bowel syndrome without diarrhea: Secondary | ICD-10-CM | POA: Diagnosis not present

## 2017-01-31 DIAGNOSIS — E109 Type 1 diabetes mellitus without complications: Secondary | ICD-10-CM | POA: Diagnosis not present

## 2017-01-31 DIAGNOSIS — F419 Anxiety disorder, unspecified: Secondary | ICD-10-CM | POA: Insufficient documentation

## 2017-01-31 DIAGNOSIS — Z7982 Long term (current) use of aspirin: Secondary | ICD-10-CM | POA: Diagnosis not present

## 2017-01-31 DIAGNOSIS — I208 Other forms of angina pectoris: Secondary | ICD-10-CM

## 2017-01-31 DIAGNOSIS — K219 Gastro-esophageal reflux disease without esophagitis: Secondary | ICD-10-CM | POA: Insufficient documentation

## 2017-01-31 DIAGNOSIS — I10 Essential (primary) hypertension: Secondary | ICD-10-CM | POA: Insufficient documentation

## 2017-01-31 DIAGNOSIS — Z79899 Other long term (current) drug therapy: Secondary | ICD-10-CM | POA: Diagnosis not present

## 2017-01-31 DIAGNOSIS — Z7902 Long term (current) use of antithrombotics/antiplatelets: Secondary | ICD-10-CM | POA: Insufficient documentation

## 2017-01-31 DIAGNOSIS — E039 Hypothyroidism, unspecified: Secondary | ICD-10-CM | POA: Insufficient documentation

## 2017-01-31 NOTE — Telephone Encounter (Signed)
Returned call to patient she stated she stopped all afternoon medications months ago due to having a metal taste.Stated she feels bette no metal taste.Appointment scheduled with Almyra Deforest PA 02/04/17 at 1:30 pm.

## 2017-01-31 NOTE — Telephone Encounter (Signed)
New message    Pt is calling asking for a call back about her medication. She said she called last week and never heard anything. She also has another question about medication. Please call.

## 2017-01-31 NOTE — Progress Notes (Signed)
Daily Session Note  Patient Details  Name: [name]: [id number] Date of Birth: 10/13/1955 Referring Provider:     CARDIAC REHAB PHASE II ORIENTATION from 11/11/2016 in [place]  Referring Provider  Quay Burow MD      Encounter Date: 01/31/2017  Check In:     Session Check In - 01/31/17 0914      Check-In   Location [facility]   Staff Present Cleda Mccreedy, MS, Exercise Physiologist;Molly diVincenzo, MS, ACSM RCEP, Exercise Physiologist;Amber Fair, MS, ACSM RCEP, Exercise Physiologist;Raffaele Derise, RN, BSN   Supervising physician immediately available to respond to emergencies [facility] immediately available   Physician(s) Dr. Allyson Sabal   Medication changes reported     No   Fall or balance concerns reported    No   Tobacco Cessation No Change   Warm-up and Cool-down Performed as group-led instruction   Resistance Training Performed Yes   VAD Patient? No     Pain Assessment   Currently in Pain? No/denies   Multiple Pain Sites No      Capillary Blood Glucose: No results found for this or any previous visit (from the past 24 hour(s)).    History  Smoking Status  . Never Smoker  Smokeless Tobacco  . Never Used    Goals Met:  Exercise tolerated well  Goals Unmet:  Dizziness  Comments:  Pt c/o dizziness upon arrival to cardiac rehab.  Pt states she woke up this morning with dizziness which is not usual for her. Pt also c/o sinus pressure.  No change in symptoms with position change.  Orthostatic VS:  137/80, HR- 62 lying, 130/88, HR- 66 sitting, 139/76, HR-66 standing.  Pt given gatorade.  Pt tolerated seated exercises without difficulty.   Pt instructed to contact MD if symptoms persist or worsen.  Understanding verbalized.   Andi Hence, RN, BSN    Dr. Fransico Him is Medical Director for Cardiac Rehab at [facility].

## 2017-01-31 NOTE — Telephone Encounter (Signed)
Let's try Crestor 20 mg and recheck labs in 2 months

## 2017-02-03 NOTE — Telephone Encounter (Signed)
Pt called back, she stated that she is still not taking her afternoon medications. Informed pt that she will need to discuss this at this appt

## 2017-02-03 NOTE — Telephone Encounter (Signed)
Left detailed message for pt--OK per DPR---asked pt to call back with preferred Pharmacy. Told pt I will mail Lab slips to have Cholesterol rechecked in September.

## 2017-02-03 NOTE — Telephone Encounter (Signed)
I called patient to confirm her appointment for 02/04/17 with Almyra Deforest, PA and she states she just had a call from [facility] instructing her to change her medication---does she need to keep her appointment tomorrow?

## 2017-02-03 NOTE — Telephone Encounter (Signed)
Per 7-2 tele call-she stopped all afternoon medications months ago due to having a metal taste.Stated she feels bette no metal taste.Appointment scheduled with Almyra Deforest PA 02/04/17 at 1:30 pm. If opt is still not taking her medications she will need to come in for visit to discuss. Metoprolol, pantoprazole, ranexa? Is that all that she stopped?

## 2017-02-03 NOTE — Telephone Encounter (Signed)
See Telephone note from 01/31/17. Pt scheduled appt due to having metal taste in her mouth and stopping all afternoon meds. Pt still needs to come in tomorrow.

## 2017-02-04 ENCOUNTER — Encounter: Payer: Self-pay | Admitting: Physician Assistant

## 2017-02-04 ENCOUNTER — Ambulatory Visit (INDEPENDENT_AMBULATORY_CARE_PROVIDER_SITE_OTHER): Payer: Federal, State, Local not specified - PPO | Admitting: Physician Assistant

## 2017-02-04 ENCOUNTER — Encounter (HOSPITAL_COMMUNITY): Payer: Federal, State, Local not specified - PPO

## 2017-02-04 VITALS — BP 128/76 | HR 71 | Ht 63.0 in | Wt 217.0 lb

## 2017-02-04 DIAGNOSIS — E785 Hyperlipidemia, unspecified: Secondary | ICD-10-CM | POA: Diagnosis not present

## 2017-02-04 DIAGNOSIS — Z79899 Other long term (current) drug therapy: Secondary | ICD-10-CM

## 2017-02-04 DIAGNOSIS — E039 Hypothyroidism, unspecified: Secondary | ICD-10-CM | POA: Diagnosis not present

## 2017-02-04 DIAGNOSIS — I2510 Atherosclerotic heart disease of native coronary artery without angina pectoris: Secondary | ICD-10-CM | POA: Diagnosis not present

## 2017-02-04 MED ORDER — ROSUVASTATIN CALCIUM 20 MG PO TABS: 20.0000 mg | ORAL_TABLET | Freq: Every day | ORAL | 3 refills | Status: DC

## 2017-02-04 NOTE — Patient Instructions (Signed)
Medication Instructions:   STOP Lipitor START Crestor 20mg  DAILY AT NIGHT CONTINUE other medications  Labwork:   Fasting labwork (cholesterol, liver function test) in 6-8 weeks (Week of August 27th to 31st). Please come to the [place] office during regular business hours as we now have a [place] station onsite. They are regularly open from 8:30am to 5pm with a lunch closure from 12:45 to 1:45pm.  Testing/Procedures:  none  Follow-Up:  With Dr. Gwenlyn Found in 6-9 months  If you need a refill on your cardiac medications before your next appointment, please call your pharmacy.

## 2017-02-04 NOTE — Progress Notes (Signed)
Cardiology Office Note    Date:  02/05/2017   ID:  [name], DOB January 29, 1956, MRN [id number]  PCP:  Darreld Mclean, MD  Cardiologist:  Dr. Gwenlyn Found   Chief Complaint  Patient presents with  . Appointment    tiredness. no other complaints    History of Present Illness:  AVIYANNA Day is a 61 y.o. female with PMH of HTN, obesity, hypothyroidism and CAD. Her initial cardiac catheterization in 12/10/2012 performed by Dr. Ellyn Hack demonstrated diffuse LAD disease with essentially subtotal occlusion that had the appearance of possible spontaneous coronary artery dissection, however with existing CAD, single diffuse disease was also possible. It was decided not to pursue any intervention at the time but to wait and have patient returned several days later for relook cath to reevaluate the LAD. She returned to the cath lab on 12/15/2012 for relook study, she was noted to have progression of LAD disease to total occlusion at the original 95% subtotal location with improved D1 to diagonal LAD and right to left collaterals. The image was reviewed with the interventional team and the concluded the best course of option was not proceeded with extensive LAD PTCA-PCI in the absence of ongoing symptom. Medical therapy was recommended. Last cardiac catheterization performed on 02/06/2015 showed total occlusion of entire mid to distal LAD, with faint collaterals from left to left and right-to-left, 90% ostial first septal lesion, 35% mid RCA, 45% distal RCA, 60% OM lesion. Medical therapy was recommended.  She presents today for follow-up, she denies any recent chest discomfort. She says the metallic taste is completely gone after discontinuation of her afternoon medication. Since then, she has resumed all medication except for Lipitor. She is willing to try the Crestor and moderate dose 20 mg for now. She will need a fasting lipid panel and LFTs. If she is unable to tolerate Crestor, she will need to  be referred to the lipid clinic. Otherwise she has no chest discomfort, shortness breath, lower extremity edema, orthopnea or paroxysmal nocturnal dyspnea. She may end up having upcoming dental procedures, if so, given the lack of intervention in the past, she is safe to take off Plavix 7 days prior to procedure if needed by her dentist.   Past Medical History:  Diagnosis Date  . Angina effort ([facility]) 05/22/2013  . Anxiety   . CAD (coronary artery disease), possible SCAD 12/14/2012  . Colon polyps   . Duodenitis   . Dyspnea on exertion    LEXISCAN, 09/30/2008 - normal, EKG negative for ischemia, no ECG changes  . Esophageal stricture   . Gastritis   . GERD (gastroesophageal reflux disease)   . Gout    "only from RX given after MI" (01/22/2013)  . H/O hiatal hernia   . History of esophageal stricture   . Hypertension    2D ECHO, 09/30/2008 - EF >55%, normal: RENAL DOPPLER, 03/21/2007 - normal duplex study  . Hypothyroidism   . IBS (irritable bowel syndrome)   . Ischemic colitis ([place])   . Lower GI bleeding    10/07/11 "I've had 3 episodes in the past year"  . Myocardial infarction [facility]) 12/10/2012   "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)  . Tubular adenoma polyp of rectum 2008    Past Surgical History:  Procedure Laterality Date  . ABDOMINAL HYSTERECTOMY  1999  . ANTERIOR LUMBAR FUSION  2010   "L4-5" (01/22/2013)  . BACK SURGERY  09/2008  . CARDIAC CATHETERIZATION N/A 02/06/2015   Procedure: Left Heart  Cath and Coronary Angiography;  Surgeon: Leonie Man, MD;  Location: [facility];  Service: Cardiovascular;  Laterality: N/A;  . ESOPHAGEAL DILATION  08/2010  . ESOPHAGEAL DILATION     "once or twice" (01/22/2013)  . HAMMER TOE SURGERY Bilateral ~ 2002  . LEFT HEART CATHETERIZATION WITH CORONARY ANGIOGRAM N/A 12/11/2012   Procedure: LEFT HEART CATHETERIZATION WITH CORONARY ANGIOGRAM;  Surgeon: Leonie Man, MD;  Location: [facility] CATH LAB;  Service: Cardiovascular;   Laterality: N/A;  . LEFT HEART CATHETERIZATION WITH CORONARY ANGIOGRAM N/A 12/15/2012   Procedure: LEFT HEART CATHETERIZATION WITH CORONARY ANGIOGRAM;  Surgeon: Leonie Man, MD;  Location: [facility] CATH LAB;  Service: Cardiovascular;  Laterality: N/A;  . PERIPHERAL VENOUS STUDY  10/03/2008   No evidence of DVT, superficial thrombosis, or Baker's cyst  . TONSILLECTOMY AND ADENOIDECTOMY  1960's  . TUBAL LIGATION  1985    Current Medications: Outpatient Medications Prior to Visit  Medication Sig Dispense Refill  . ALPRAZolam (XANAX) 0.5 MG tablet Take 0.5 mg by mouth daily.     Marland Kitchen amLODipine (NORVASC) 5 MG tablet Take 1 tablet (5 mg total) by mouth daily. 30 tablet 11  . amoxicillin (AMOXIL) 500 MG capsule Take 1 capsule (500 mg total) by mouth 3 (three) times daily. 30 capsule 0  . aspirin EC 81 MG EC tablet Take 1 tablet (81 mg total) by mouth daily.    . clopidogrel (PLAVIX) 75 MG tablet TAKE 1 TABLET BY MOUTH ONCE A DAY WITH BREAKFAST 30 tablet 10  . isosorbide mononitrate (IMDUR) 60 MG 24 hr tablet TAKE 1 TABLET BY MOUTH ONCE A DAY 30 tablet 11  . lisinopril (PRINIVIL,ZESTRIL) 5 MG tablet TAKE 1 TABLET BY MOUTH DAILY. 90 tablet 3  . metoprolol tartrate (LOPRESSOR) 25 MG tablet TAKE 1 TABLET BY MOUTH TWICE A DAY 60 tablet 11  . nitroGLYCERIN (NITROSTAT) 0.4 MG SL tablet Place 1 tablet (0.4 mg total) under the tongue every 5 (five) minutes as needed for chest pain. 25 tablet 5  . pantoprazole (PROTONIX) 40 MG tablet TAKE 1 TABLET BY MOUTH TWICE A DAY 60 tablet 10  . RANEXA 1000 MG SR tablet TAKE 1 TABLET BY MOUTH 2 TIMES DAILY. 180 tablet 2  . estradiol (ESTRACE) 0.5 MG tablet Take 1 mg by mouth daily.      No facility-administered medications prior to visit.      Allergies:   Patient has no known allergies.   Social History   Social History  . Marital status: Single    Spouse name: N/A  . Number of children: 2  . Years of education: N/A   Occupational History  . Drug Engineer, site   Social History Main Topics  . Smoking status: Never Smoker  . Smokeless tobacco: Never Used  . Alcohol use No  . Drug use: No  . Sexual activity: No   Other Topics Concern  . None   Social History Narrative   Daily caffeine use.     Family History:  The patient's family history includes Colon cancer in her brother; Heart attack in her brother; Kidney disease in her mother; Stroke in her father.   ROS:   Please see the history of present illness.    ROS All other systems reviewed and are negative.   PHYSICAL EXAM:   VS:  BP 128/76   Pulse 71   Ht 5\' 3"  (1.6 m)   Wt 217 lb (98.4 kg)  SpO2 96%   BMI 38.44 kg/m    GEN: Well nourished, well developed, in no acute distress  HEENT: normal  Neck: no JVD, carotid bruits, or masses Cardiac: RRR; no murmurs, rubs, or gallops,no edema  Respiratory:  clear to auscultation bilaterally, normal work of breathing GI: soft, nontender, nondistended, + BS MS: no deformity or atrophy  Skin: warm and dry, no rash Neuro:  Alert and Oriented x 3, Strength and sensation are intact Psych: euthymic mood, full affect  Wt Readings from Last 3 Encounters:  02/04/17 217 lb (98.4 kg)  01/12/17 217 lb 6.4 oz (98.6 kg)  11/11/16 222 lb 3.6 oz (100.8 kg)      Studies/Labs Reviewed:   EKG:  EKG is not ordered today.   Recent Labs: 01/12/2017: ALT 10; BUN 15; Creatinine, Ser 1.09; Hemoglobin 12.3; Platelets 282.0; Potassium 3.9; Sodium 133; TSH 3.02   Lipid Panel    Component Value Date/Time   CHOL 239 (H) 01/12/2017 1455   TRIG 247.0 (H) 01/12/2017 1455   HDL 56.40 01/12/2017 1455   CHOLHDL 4 01/12/2017 1455   VLDL 49.4 (H) 01/12/2017 1455   LDLCALC 204 (H) 05/20/2015 0918   LDLDIRECT 128.0 01/12/2017 1455    Additional studies/ records that were reviewed today include:   Myoview 12/28/2012 QPS Raw Data Images:  Normal; no motion artifact; normal heart/lung ratio. Stress Images:  There is decreased uptake in  the anterior wall. Rest Images:  There is decreased uptake in the anterior wall. Subtraction (SDS):  No evidence of ischemia.  Impression Exercise Capacity:  Lexiscan with no exercise. BP Response:  Normal blood pressure response. Clinical Symptoms:  No significant symptoms noted. ECG Impression:  No significant ST segment change suggestive of ischemia. Comparison with Prior Nuclear Study: No images to compare  Overall Impression:  Low risk stress nuclear study Antero apical scar w/o ischemia. ROV with Dr. Gwenlyn Found  LV Wall Motion:  Decreased WM in LAD distribution    Cath 02/06/2015 Conclusion   1. Stable multivessel disease with diffuse eccentric stenosis in the circumflex-OM 2 , diffuse mild-to-moderate disease in the RCA , as well as subtotal/total occlusion of the entire mid to distal LAD -- with faint collaterals from left the left and right to left, along with 2. The left ventricular systolic function is essentially normal with normal LVEDP 3. Mid LAD to Dist LAD lesion, 100% stenosed - similar in appearance to 11/2012 (but now essentially CTO). Ost 1st Sept lesion, 90% stenosed. 4. Mid RCA lesion, 35% stenosed. Dist RCA lesion, 45% stenosed. RPDA lesion, 40% stenosed. 5. Ost 3rd Mrg to 3rd Mrg lesion, 60% stenosed (stable to improved from 2014). Tiny Ost 2nd Mrg , 90% stenosed. 6.     There is no obvious culprit lesion for the patient's some unstable angina. She indeed does have diffuse disease in the LAD but no significant change from before. The dyspnea no see in the distal circumflex-OM 2 as well as RCA appeared to be if anything improved from her last catheterization. She does have several focal stenoses and ostial parts of small branches including a small OM 2 and septal perforator.    Recommendations:  Standard post radial cath TR band removal.  Transfer to telemetry for titration of antianginal medications. Is leery of discharge today without some adjustment of her  medications which make sense.  Discontinue IV heparin. Would likely titrate up Imdur and potentially adjust antihypertensives agents pending her blood pressure evaluation post cath.  Would expect discharge tomorrow  once stable.       ASSESSMENT:    1. Coronary artery disease involving native coronary artery of native heart without angina pectoris   2. Encounter for long-term (current) use of medications   3. Hyperlipidemia, unspecified hyperlipidemia type   4. Hypothyroidism, unspecified type      PLAN:  In order of problems listed above:  7. Coronary artery disease: known occlusion of the LAD, no angina symptom. She is on multiple antianginal medications including amlodipine, Imdur and Ranexa, continue current medication.  8. Hyperlipidemia: She believes Lipitor was the cause of recent side effect with metallic taste, will transition to Crestor 20 mg daily. Fasting lipid panel and LFTs in 6-8 weeks.  9. Hypothyroidism: Although hypothyroidism is listed as Past medical history, I do not see any thyroid medication. Despite this, recent TSH was normal.    Medication Adjustments/Labs and Tests Ordered: Current medicines are reviewed at length with the patient today.  Concerns regarding medicines are outlined above.  Medication changes, Labs and Tests ordered today are listed in the Patient Instructions below. Patient Instructions  Medication Instructions:   STOP Lipitor START Crestor 20mg  DAILY AT NIGHT CONTINUE other medications  Labwork:   Fasting labwork (cholesterol, liver function test) in 6-8 weeks (Week of August 27th to 31st). Please come to the [facility] office during regular business hours as we now have a [place] station onsite. They are regularly open from 8:30am to 5pm with a lunch closure from 12:45 to 1:45pm.  Testing/Procedures:  none  Follow-Up:  With Dr. Gwenlyn Found in 6-9 months  If you need a refill on your cardiac medications before your next  appointment, please call your pharmacy.      Hilbert Corrigan, [place]  02/05/2017 1:32 PM    [facility] [place], [place], [place]  [address] Phone: [phone]; Fax: [phone]

## 2017-02-05 ENCOUNTER — Encounter: Payer: Self-pay | Admitting: Physician Assistant

## 2017-02-07 ENCOUNTER — Encounter (HOSPITAL_COMMUNITY)
Admission: RE | Admit: 2017-02-07 | Discharge: 2017-02-07 | Disposition: A | Discharge status: Home / Self Care | Payer: Federal, State, Local not specified - PPO | Source: Ambulatory Visit | Attending: Cardiovascular Disease | Admitting: Cardiovascular Disease

## 2017-02-07 DIAGNOSIS — Z7982 Long term (current) use of aspirin: Secondary | ICD-10-CM | POA: Diagnosis not present

## 2017-02-07 DIAGNOSIS — K219 Gastro-esophageal reflux disease without esophagitis: Secondary | ICD-10-CM | POA: Diagnosis not present

## 2017-02-07 DIAGNOSIS — E109 Type 1 diabetes mellitus without complications: Secondary | ICD-10-CM | POA: Diagnosis not present

## 2017-02-07 DIAGNOSIS — Z79899 Other long term (current) drug therapy: Secondary | ICD-10-CM | POA: Diagnosis not present

## 2017-02-07 DIAGNOSIS — Z7902 Long term (current) use of antithrombotics/antiplatelets: Secondary | ICD-10-CM | POA: Diagnosis not present

## 2017-02-07 DIAGNOSIS — I10 Essential (primary) hypertension: Secondary | ICD-10-CM | POA: Diagnosis not present

## 2017-02-07 DIAGNOSIS — E039 Hypothyroidism, unspecified: Secondary | ICD-10-CM | POA: Diagnosis not present

## 2017-02-07 DIAGNOSIS — K589 Irritable bowel syndrome without diarrhea: Secondary | ICD-10-CM | POA: Diagnosis not present

## 2017-02-07 DIAGNOSIS — F419 Anxiety disorder, unspecified: Secondary | ICD-10-CM | POA: Diagnosis not present

## 2017-02-07 DIAGNOSIS — I208 Other forms of angina pectoris: Secondary | ICD-10-CM

## 2017-02-07 DIAGNOSIS — I25118 Atherosclerotic heart disease of native coronary artery with other forms of angina pectoris: Secondary | ICD-10-CM | POA: Diagnosis not present

## 2017-02-07 DIAGNOSIS — I252 Old myocardial infarction: Secondary | ICD-10-CM | POA: Diagnosis not present

## 2017-02-09 ENCOUNTER — Encounter (HOSPITAL_COMMUNITY)
Admission: RE | Admit: 2017-02-09 | Discharge: 2017-02-09 | Disposition: A | Discharge status: Home / Self Care | Payer: Federal, State, Local not specified - PPO | Source: Ambulatory Visit | Attending: Cardiovascular Disease | Admitting: Cardiovascular Disease

## 2017-02-09 DIAGNOSIS — I10 Essential (primary) hypertension: Secondary | ICD-10-CM | POA: Diagnosis not present

## 2017-02-09 DIAGNOSIS — I252 Old myocardial infarction: Secondary | ICD-10-CM | POA: Diagnosis not present

## 2017-02-09 DIAGNOSIS — Z7982 Long term (current) use of aspirin: Secondary | ICD-10-CM | POA: Diagnosis not present

## 2017-02-09 DIAGNOSIS — I25118 Atherosclerotic heart disease of native coronary artery with other forms of angina pectoris: Secondary | ICD-10-CM | POA: Diagnosis not present

## 2017-02-09 DIAGNOSIS — Z7902 Long term (current) use of antithrombotics/antiplatelets: Secondary | ICD-10-CM | POA: Diagnosis not present

## 2017-02-09 DIAGNOSIS — K589 Irritable bowel syndrome without diarrhea: Secondary | ICD-10-CM | POA: Diagnosis not present

## 2017-02-09 DIAGNOSIS — K219 Gastro-esophageal reflux disease without esophagitis: Secondary | ICD-10-CM | POA: Diagnosis not present

## 2017-02-09 DIAGNOSIS — Z79899 Other long term (current) drug therapy: Secondary | ICD-10-CM | POA: Diagnosis not present

## 2017-02-09 DIAGNOSIS — E109 Type 1 diabetes mellitus without complications: Secondary | ICD-10-CM | POA: Diagnosis not present

## 2017-02-09 DIAGNOSIS — F419 Anxiety disorder, unspecified: Secondary | ICD-10-CM | POA: Diagnosis not present

## 2017-02-09 DIAGNOSIS — E039 Hypothyroidism, unspecified: Secondary | ICD-10-CM | POA: Diagnosis not present

## 2017-02-09 DIAGNOSIS — I2089 Other forms of angina pectoris: Secondary | ICD-10-CM

## 2017-02-09 DIAGNOSIS — I208 Other forms of angina pectoris: Secondary | ICD-10-CM

## 2017-02-11 ENCOUNTER — Encounter (HOSPITAL_COMMUNITY): Payer: Federal, State, Local not specified - PPO

## 2017-02-14 ENCOUNTER — Encounter (HOSPITAL_COMMUNITY)
Admission: RE | Admit: 2017-02-14 | Discharge: 2017-02-14 | Disposition: A | Discharge status: Home / Self Care | Payer: Federal, State, Local not specified - PPO | Source: Ambulatory Visit | Attending: Cardiovascular Disease | Admitting: Cardiovascular Disease

## 2017-02-14 DIAGNOSIS — E039 Hypothyroidism, unspecified: Secondary | ICD-10-CM | POA: Diagnosis not present

## 2017-02-14 DIAGNOSIS — I25118 Atherosclerotic heart disease of native coronary artery with other forms of angina pectoris: Secondary | ICD-10-CM | POA: Diagnosis not present

## 2017-02-14 DIAGNOSIS — Z7982 Long term (current) use of aspirin: Secondary | ICD-10-CM | POA: Diagnosis not present

## 2017-02-14 DIAGNOSIS — F419 Anxiety disorder, unspecified: Secondary | ICD-10-CM | POA: Diagnosis not present

## 2017-02-14 DIAGNOSIS — I10 Essential (primary) hypertension: Secondary | ICD-10-CM | POA: Diagnosis not present

## 2017-02-14 DIAGNOSIS — Z79899 Other long term (current) drug therapy: Secondary | ICD-10-CM | POA: Diagnosis not present

## 2017-02-14 DIAGNOSIS — I208 Other forms of angina pectoris: Secondary | ICD-10-CM

## 2017-02-14 DIAGNOSIS — Z7902 Long term (current) use of antithrombotics/antiplatelets: Secondary | ICD-10-CM | POA: Diagnosis not present

## 2017-02-14 DIAGNOSIS — K589 Irritable bowel syndrome without diarrhea: Secondary | ICD-10-CM | POA: Diagnosis not present

## 2017-02-14 DIAGNOSIS — K219 Gastro-esophageal reflux disease without esophagitis: Secondary | ICD-10-CM | POA: Diagnosis not present

## 2017-02-14 DIAGNOSIS — I252 Old myocardial infarction: Secondary | ICD-10-CM | POA: Diagnosis not present

## 2017-02-14 DIAGNOSIS — E109 Type 1 diabetes mellitus without complications: Secondary | ICD-10-CM | POA: Diagnosis not present

## 2017-02-15 NOTE — Progress Notes (Signed)
Cardiac Individual Treatment Plan  Patient Details  Name: [name] MRN: [id number] Date of Birth: [birth year] Referring Provider:     CARDIAC REHAB PHASE II ORIENTATION from 11/11/2016 in [place]  Referring Provider  Quay Burow MD      Initial Encounter Date:    CARDIAC REHAB PHASE II ORIENTATION from 11/11/2016 in [place]  Date  11/11/16  Referring Provider  Quay Burow MD      Visit Diagnosis: Stable angina [facility])  Patient's Home Medications on Admission:  Current Outpatient Prescriptions:  .  ALPRAZolam (XANAX) 0.5 MG tablet, Take 0.5 mg by mouth daily. , Disp: , Rfl:  .  amLODipine (NORVASC) 5 MG tablet, Take 1 tablet (5 mg total) by mouth daily., Disp: 30 tablet, Rfl: 11 .  amoxicillin (AMOXIL) 500 MG capsule, Take 1 capsule (500 mg total) by mouth 3 (three) times daily., Disp: 30 capsule, Rfl: 0 .  aspirin EC 81 MG EC tablet, Take 1 tablet (81 mg total) by mouth daily., Disp: , Rfl:  .  clopidogrel (PLAVIX) 75 MG tablet, TAKE 1 TABLET BY MOUTH ONCE A DAY WITH BREAKFAST, Disp: 30 tablet, Rfl: 10 .  estradiol (ESTRACE) 1 MG tablet, Take 1 mg by mouth daily., Disp: , Rfl:  .  isosorbide mononitrate (IMDUR) 60 MG 24 hr tablet, TAKE 1 TABLET BY MOUTH ONCE A DAY, Disp: 30 tablet, Rfl: 11 .  lisinopril (PRINIVIL,ZESTRIL) 5 MG tablet, TAKE 1 TABLET BY MOUTH DAILY., Disp: 90 tablet, Rfl: 3 .  metoprolol tartrate (LOPRESSOR) 25 MG tablet, TAKE 1 TABLET BY MOUTH TWICE A DAY, Disp: 60 tablet, Rfl: 11 .  nitroGLYCERIN (NITROSTAT) 0.4 MG SL tablet, Place 1 tablet (0.4 mg total) under the tongue every 5 (five) minutes as needed for chest pain., Disp: 25 tablet, Rfl: 5 .  pantoprazole (PROTONIX) 40 MG tablet, TAKE 1 TABLET BY MOUTH TWICE A DAY, Disp: 60 tablet, Rfl: 10 .  RANEXA 1000 MG SR tablet, TAKE 1 TABLET BY MOUTH 2 TIMES DAILY., Disp: 180 tablet, Rfl: 2 .  rosuvastatin (CRESTOR) 20 MG tablet, Take 1  tablet (20 mg total) by mouth at bedtime., Disp: 90 tablet, Rfl: 3  Past Medical History: Past Medical History:  Diagnosis Date  . Angina effort ([place]) 05/22/2013  . Anxiety   . CAD (coronary artery disease), possible SCAD 12/14/2012  . Colon polyps   . Duodenitis   . Dyspnea on exertion    LEXISCAN, 09/30/2008 - normal, EKG negative for ischemia, no ECG changes  . Esophageal stricture   . Gastritis   . GERD (gastroesophageal reflux disease)   . Gout    "only from RX given after MI" (01/22/2013)  . H/O hiatal hernia   . History of esophageal stricture   . Hypertension    2D ECHO, 09/30/2008 - EF >55%, normal: RENAL DOPPLER, 03/21/2007 - normal duplex study  . Hypothyroidism   . IBS (irritable bowel syndrome)   . Ischemic colitis ([place])   . Lower GI bleeding    10/07/11 "I've had 3 episodes in the past year"  . Myocardial infarction [facility]) 12/10/2012   "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)  . Tubular adenoma polyp of rectum 2008    Tobacco Use: History  Smoking Status  . Never Smoker  Smokeless Tobacco  . Never Used    Labs: Recent Review Flowsheet Data    Labs for ITP Cardiac and Pulmonary Rehab Latest Ref Rng & Units 04/15/2014 02/06/2015  02/07/2015 05/20/2015 01/12/2017   Cholestrol 0 - 200 mg/dL 184 - 265(H) 298(H) 239(H)   LDLCALC <130 mg/dL 92 - 167(H) 204(H) -   LDLDIRECT mg/dL - - - - 128.0   HDL >39.00 mg/dL 63 - 60 61 56.40   Trlycerides 0.0 - 149.0 mg/dL 145 - 192(H) 166(H) 247.0(H)   Hemoglobin A1c 4.6 - 6.5 % - 5.3 - - 5.7      Capillary Blood Glucose: Lab Results  Component Value Date   GLUCAP 97 12/12/2012   GLUCAP 91 12/12/2012   GLUCAP 81 10/03/2008     Exercise Target Goals:    Exercise Program Goal: Individual exercise prescription set with THRR, safety & activity barriers. Participant demonstrates ability to understand and report RPE using BORG scale, to self-measure pulse accurately, and to acknowledge the importance of the exercise  prescription.  Exercise Prescription Goal: Starting with aerobic activity 30 plus minutes a day, 3 days per week for initial exercise prescription. Provide home exercise prescription and guidelines that participant acknowledges understanding prior to discharge.  Activity Barriers & Risk Stratification:     Activity Barriers & Cardiac Risk Stratification - 11/11/16 0932      Activity Barriers & Cardiac Risk Stratification   Activity Barriers Deconditioning;Muscular Weakness   Cardiac Risk Stratification High      6 Minute Walk:     6 Minute Walk    Row Name 11/11/16 1239         6 Minute Walk   Phase Initial     Distance 1350 feet     Walk Time 6 minutes     # of Rest Breaks 0     MPH 2.6     METS 2.8     RPE 9     VO2 Peak 9.7     Symptoms No     Resting HR 72 bpm     Resting BP 118/82     Max Ex. HR 95 bpm     Max Ex. BP 138/84     2 Minute Post BP 102/80        Oxygen Initial Assessment:   Oxygen Re-Evaluation:   Oxygen Discharge (Final Oxygen Re-Evaluation):   Initial Exercise Prescription:     Initial Exercise Prescription - 11/11/16 1200      Date of Initial Exercise RX and Referring Provider   Date 11/11/16   Referring Provider Quay Burow MD     Treadmill   MPH 2   Grade 0   Minutes 10   METs 2.53     Bike   Level 0.6   Minutes 10   METs 2.13     NuStep   Level 1   Minutes 10   METs 1.8     Prescription Details   Frequency (times per week) 5   Duration Progress to 45 minutes of aerobic exercise without signs/symptoms of physical distress     Intensity   THRR 40-80% of Max Heartrate 64-128   Ratings of Perceived Exertion 11-13   Perceived Dyspnea 0-4     Progression   Progression Continue to progress workloads to maintain intensity without signs/symptoms of physical distress.     Resistance Training   Training Prescription Yes   Weight 2   Reps 10-15      Perform Capillary Blood Glucose checks as  needed.  Exercise Prescription Changes:     Exercise Prescription Changes    Row Name 11/22/16 1600 12/06/16 1000 12/22/16 1000 01/06/17 1200 01/26/17  1425     Response to Exercise   Blood Pressure (Admit) 112/70 110/70 122/82 128/72 122/74   Blood Pressure (Exercise) 160/80 128/80 124/72 142/82 140/70   Blood Pressure (Exit) 112/70 116/78 104/62 114/70 100/60   Heart Rate (Admit) 93 bpm 65 bpm 68 bpm 70 bpm 71 bpm   Heart Rate (Exercise) 100 bpm 100 bpm 104 bpm 96 bpm 95 bpm   Heart Rate (Exit) 91 bpm 80 bpm 66 bpm 70 bpm 71 bpm   Rating of Perceived Exertion (Exercise) 13 13 14 12 14    Symptoms none none none none none   Comments Pt is tolerating exercise in cardiac rehab very well; will continue to monitor pt's progress adjusted Ex Rx. pt moved from airdyne bike to recumbent bike due to comfort  -  -  -   Duration Continue with 30 min of aerobic exercise without signs/symptoms of physical distress. Continue with 30 min of aerobic exercise without signs/symptoms of physical distress. Continue with 30 min of aerobic exercise without signs/symptoms of physical distress. Continue with 30 min of aerobic exercise without signs/symptoms of physical distress. Continue with 30 min of aerobic exercise without signs/symptoms of physical distress.   Intensity THRR unchanged THRR unchanged THRR unchanged THRR unchanged THRR unchanged     Progression   Progression  - Continue to progress workloads to maintain intensity without signs/symptoms of physical distress. Continue to progress workloads to maintain intensity without signs/symptoms of physical distress. Continue to progress workloads to maintain intensity without signs/symptoms of physical distress. Continue to progress workloads to maintain intensity without signs/symptoms of physical distress.   Average METs  - 3 3.6 3.6 3.6     Resistance Training   Training Prescription Yes Yes Yes Yes Yes   Weight 2 3lbs 3lbs 3lbs 4lbs   Reps 10-15 10-15  10-15 10-15 10-15   Time 10 Minutes 10 Minutes 10 Minutes 10 Minutes 10 Minutes     Treadmill   MPH 2.5 2.8 2.9 2.9 3   Grade 0 1 2 2 2    Minutes 10 10 10 10 10    METs 2.91 3.53 4.02 4.02 4.02     Bike   Level 0.6  -  -  -  -   Minutes 10  -  -  -  -   METs 2.13  -  -  -  -     Recumbant Bike   Level  - 2.5 3.4  -  -   Watts  -  - 42  -  -   Minutes  - 10 10  -  -   METs  - 2.5 3.2  -  -     NuStep   Level 3 3 5 5 5    Minutes 10 10 10 10 10    METs 2.4 3 4  3.1 3.2     Arm Ergometer   Level  -  -  - 1 4   Minutes  -  -  - 10 10     Home Exercise Plan   Plans to continue exercise at  - [employer] (comment)  [place] (comment)  [place] (comment)  [place] (comment)   Frequency  - Add 3 additional days to program exercise sessions. Add 3 additional days to program exercise sessions. Add 3 additional days to program exercise sessions. Add 3 additional days to program exercise sessions.   Initial Home Exercises Provided  - 11/29/16 11/29/16 11/29/16 11/29/16  [place] Name 02/10/17 1400             Response to Exercise   Blood Pressure (Admit) 106/62       Blood Pressure (Exercise) 130/80       Blood Pressure (Exit) 110/78       Heart Rate (Admit) 67 bpm       Heart Rate (Exercise) 105 bpm       Heart Rate (Exit) 67 bpm       Rating of Perceived Exertion (Exercise) 13       Symptoms none       Duration Continue with 30 min of aerobic exercise without signs/symptoms of physical distress.       Intensity THRR unchanged         Progression   Progression Continue to progress workloads to maintain intensity without signs/symptoms of physical distress.       Average METs 3.6         Resistance Training   Training Prescription Yes       Weight 4lbs       Reps 10-15       Time 10 Minutes         Treadmill   MPH 3       Grade 2       Minutes 10       METs 4.12         NuStep   Level 5       Minutes 10        METs 3         Arm Ergometer   Level 4.4       Minutes 10         Home Exercise Plan   Plans to continue exercise at [employer] (comment)  [employer]       Frequency Add 3 additional days to program exercise sessions.       Initial Home Exercises Provided 11/29/16          Exercise Comments:     Exercise Comments    Row Name 11/23/16 1343 12/15/16 1053 01/17/17 1105 02/10/17 1430     Exercise Comments Reviewed METs and activity levels with pt. Pt is tolerating exercise very well; will continue to monitor exercise progression. Reviewed METs and activity levels with pt. Pt is tolerating exercise very well; will continue to monitor exercise progression. Reviewed METs and activity levels with pt. Pt is tolerating exercise very well; will continue to monitor exercise progression. Reviewed METs and goals with pt. Pt is tolerating exercise very well; will continue to monitor exercise progression.       Exercise Goals and Review:     Exercise Goals    Row Name 11/11/16 0936 11/23/16 1340           Exercise Goals   Increase Physical Activity (P)  Yes  Build confidence with functional acitivites and ADL's Yes      Intervention (P)  Provide advice, education, support and counseling about physical activity/exercise needs.;Develop an individualized exercise prescription for aerobic and resistive training based on initial evaluation findings, risk stratification, comorbidities and participant's personal goals. Provide advice, education, support and counseling about physical activity/exercise needs.;Develop an individualized exercise prescription for aerobic and resistive training based on initial evaluation findings, risk stratification, comorbidities and participant's personal goals.      Expected Outcomes (P)  Achievement of increased cardiorespiratory fitness and enhanced flexibility, muscular endurance and strength shown through measurements of functional capacity and  personal statement  of participant. Achievement of increased cardiorespiratory fitness and enhanced flexibility, muscular endurance and strength shown through measurements of functional capacity and personal statement of participant.      Increase Strength and Stamina (P)  Yes  Be able to climb stairs and uphill without fatigue and SOB Yes      Intervention (P)  Provide advice, education, support and counseling about physical activity/exercise needs.;Develop an individualized exercise prescription for aerobic and resistive training based on initial evaluation findings, risk stratification, comorbidities and participant's personal goals. Provide advice, education, support and counseling about physical activity/exercise needs.;Develop an individualized exercise prescription for aerobic and resistive training based on initial evaluation findings, risk stratification, comorbidities and participant's personal goals.      Expected Outcomes (P)  Achievement of increased cardiorespiratory fitness and enhanced flexibility, muscular endurance and strength shown through measurements of functional capacity and personal statement of participant. Achievement of increased cardiorespiratory fitness and enhanced flexibility, muscular endurance and strength shown through measurements of functional capacity and personal statement of participant.         Exercise Goals Re-Evaluation :     Exercise Goals Re-Evaluation    Row Name 11/23/16 1340 11/29/16 0943 12/15/16 1029 01/18/17 1708 01/18/17 1714     Exercise Goal Re-Evaluation   Exercise Goals Review Increase Physical Activity;Increase Strenth and Stamina Increase Physical Activity;Increase Strenth and Stamina Increase Physical Activity;Increase Strenth and Stamina (P)  Increase Physical Activity;Increase Strenth and Stamina Increase Physical Activity;Increase Strenth and Stamina   Comments Pt is tolerating exercise very well in cardiac rehab. Reviewed home exercise with pt today.  Pt  plans to go the [facility] for exercise and use exercise equipment 2x/week in addition to coming to cardiac rehab.  Reviewed THR, pulse, RPE, sign and symptoms, NTG use, and when to call 911 or MD.  Also discussed weather considerations and indoor options.  Pt voiced understanding. Pt stated "having more energy/stamina" and "able to perform ADL's without difficulty or fatigue." Pt is exercising at [employer] 2-3x/week for 30 minutes  - Pt is tolerating exercise WL increases very well and is down in weight   Expected Outcomes Pt will continue to come 3x/week to cardiac rehab to work on increasing cardiorespiratory fitness Pt will be compliant with HEP and continue to improve in cardiorespiratory fitness Pt will be compliant with HEP and continue to improve in cardiorespiratory fitness  - Pt will be compliant with HEP and continue to improve in cardiorespiratory fitness   Row Name 02/10/17 1428             Exercise Goal Re-Evaluation   Exercise Goals Review Increase Strenth and Stamina;Increase Physical Activity       Comments Pt is weight is consistently decreasing. Pt is also very compliant with HEP and getting stronger(cardiovsacular and musculoskeletal).       Expected Outcomes Pt will be compliant with HEP and continue to improve in cardiorespiratory fitness           Discharge Exercise Prescription (Final Exercise Prescription Changes):     Exercise Prescription Changes - 02/10/17 1400      Response to Exercise   Blood Pressure (Admit) 106/62   Blood Pressure (Exercise) 130/80   Blood Pressure (Exit) 110/78   Heart Rate (Admit) 67 bpm   Heart Rate (Exercise) 105 bpm   Heart Rate (Exit) 67 bpm   Rating of Perceived Exertion (Exercise) 13   Symptoms none   Duration Continue with 30 min of aerobic exercise without signs/symptoms  of physical distress.   Intensity THRR unchanged     Progression   Progression Continue to progress workloads to maintain intensity without signs/symptoms of  physical distress.   Average METs 3.6     Resistance Training   Training Prescription Yes   Weight 4lbs   Reps 10-15   Time 10 Minutes     Treadmill   MPH 3   Grade 2   Minutes 10   METs 4.12     NuStep   Level 5   Minutes 10   METs 3     Arm Ergometer   Level 4.4   Minutes 10     Home Exercise Plan   Plans to continue exercise at [employer] (comment)  [employer]   Frequency Add 3 additional days to program exercise sessions.   Initial Home Exercises Provided 11/29/16      Nutrition:  Target Goals: Understanding of nutrition guidelines, daily intake of sodium 1500mg , cholesterol 200mg , calories 30% from fat and 7% or less from saturated fats, daily to have 5 or more servings of fruits and vegetables.  Biometrics:     Pre Biometrics - 11/11/16 1245      Pre Biometrics   Waist Circumference 40.25 inches   Hip Circumference 52 inches   Waist to Hip Ratio 0.77 %   Triceps Skinfold 52 mm   % Body Fat 51.1 %   Grip Strength 35 kg   Flexibility 10 in   Single Leg Stand 8.03 seconds       Nutrition Therapy Plan and Nutrition Goals:     Nutrition Therapy & Goals - 01/26/17 1215      Nutrition Therapy   Diet Therapeutic Lifestyle Changes     Personal Nutrition Goals   Personal Goal #2 Pt to have a basic understanding of a DM diet due to A1c > 6     Intervention Plan   Intervention Prescribe, educate and counsel regarding individualized specific dietary modifications aiming towards targeted core components such as weight, hypertension, lipid management, diabetes, heart failure and other comorbidities.;Nutrition handout(s) given to patient.  1500 kcal, 5 day menu ideas; Nutrition Therapy for Type II DM   Expected Outcomes Short Term Goal: Understand basic principles of dietary content, such as calories, fat, sodium, cholesterol and nutrients.;Long Term Goal: Adherence to prescribed nutrition plan.      Nutrition Discharge: Nutrition Scores:      Nutrition Assessments - 12/06/16 0953      MEDFICTS Scores   Pre Score 28      Nutrition Goals Re-Evaluation:     Nutrition Goals Re-Evaluation    [place] Name 01/26/17 1215             Goals   Nutrition Goal Wt loss of 1-2 lb/week to a wt loss goal of 6-24 lb at graduation from [place] Pt has lost 5 lb since starting Cardiac Rehab.           Nutrition Goals Re-Evaluation:     Nutrition Goals Re-Evaluation    [place] Name 01/26/17 1215             Goals   Nutrition Goal Wt loss of 1-2 lb/week to a wt loss goal of 6-24 lb at graduation from [place] Pt has lost 5 lb since starting Cardiac Rehab.           Nutrition Goals Discharge (Final Nutrition Goals Re-Evaluation):  Nutrition Goals Re-Evaluation - 01/26/17 1215      Goals   Nutrition Goal Wt loss of 1-2 lb/week to a wt loss goal of 6-24 lb at graduation from [place] Pt has lost 5 lb since starting Cardiac Rehab.       Psychosocial: Target Goals: Acknowledge presence or absence of significant depression and/or stress, maximize coping skills, provide positive support system. Participant is able to verbalize types and ability to use techniques and skills needed for reducing stress and depression.  Initial Review & Psychosocial Screening:     Initial Psych Review & Screening - 11/15/16 1105      Initial Review   Current issues with Current Stress Concerns   Source of Stress Concerns Chronic Illness;Unable to participate in former interests or hobbies     [place]? Yes     Barriers   Psychosocial barriers to participate in program There are no identifiable barriers or psychosocial needs.     Screening Interventions   Interventions Encouraged to exercise      Quality of Life Scores:     Quality of Life - 11/29/16 0918      Quality of Life Scores   Health/Function Pre 18.96 %  pt concerned about recent cardiac  event and fatigue. pt has fears and worries about living with CAD.  pt is looking forward to resuming usual activities however is uncertain she can.  pt feels her fatigue is related to her thyroid.     Socioeconomic Pre 27.83 %   Psych/Spiritual Pre 14.57 %  pt displays health related anxiety from recent lifestyle changes from cardiac event.  Fatigue has resulted in decreased ability to participate in pleasurable activities.     Family Pre 22.5 %   GLOBAL Pre 20.15 %  pt offered reassurance and emotional support       PHQ-9: Recent Review Flowsheet Data    Depression screen [facility] 2/9 11/15/2016   Decreased Interest 0   Down, Depressed, Hopeless 0   PHQ - 2 Score 0     Interpretation of Total Score  Total Score Depression Severity:  1-4 = Minimal depression, 5-9 = Mild depression, 10-14 = Moderate depression, 15-19 = Moderately severe depression, 20-27 = Severe depression   Psychosocial Evaluation and Intervention:     Psychosocial Evaluation - 11/15/16 1107      Psychosocial Evaluation & Interventions   Interventions Encouraged to exercise with the program and follow exercise prescription;Stress management education;Relaxation education   Comments health related anxiety    Expected Outcomes pt will demonstrate positive outlook wtih good coping skills.    Continue Psychosocial Services  No Follow up required      Psychosocial Re-Evaluation:     Psychosocial Re-Evaluation    [place] Name 11/24/16 1713 12/22/16 1528 01/10/17 1728 02/09/17 1716       Psychosocial Re-Evaluation   Current issues with Current Anxiety/Panic Current Anxiety/Panic Current Anxiety/Panic Current Anxiety/Panic    Comments pt with health related anxiety is tolerating CR participation without difficulty.  exhibits positive interactions with staff and class mates.  pt with health related anxiety is tolerating CR participation without difficulty.  exhibits positive interactions with staff and class mates.  pt  with health related anxiety is tolerating CR participation without difficulty.  exhibits positive interactions with staff and class mates.  pt with health related anxiety is tolerating CR participation without difficulty.  exhibits positive interactions with staff and class mates.  Expected Outcomes pt will exhibit positive outlook with good coping skills.  pt will exhibit positive outlook with good coping skills.  pt will exhibit positive outlook with good coping skills.  pt will exhibit positive outlook with good coping skills.     Interventions Encouraged to attend Cardiac Rehabilitation for the exercise;Stress management education;Relaxation education Encouraged to attend Cardiac Rehabilitation for the exercise;Stress management education;Relaxation education Encouraged to attend Cardiac Rehabilitation for the exercise;Stress management education;Relaxation education Encouraged to attend Cardiac Rehabilitation for the exercise;Stress management education;Relaxation education    Continue Psychosocial Services  Follow up required by staff Follow up required by staff Follow up required by staff Follow up required by staff       Psychosocial Discharge (Final Psychosocial Re-Evaluation):     Psychosocial Re-Evaluation - 02/09/17 1716      Psychosocial Re-Evaluation   Current issues with Current Anxiety/Panic   Comments pt with health related anxiety is tolerating CR participation without difficulty.  exhibits positive interactions with staff and class mates.    Expected Outcomes pt will exhibit positive outlook with good coping skills.    Interventions Encouraged to attend Cardiac Rehabilitation for the exercise;Stress management education;Relaxation education   Continue Psychosocial Services  Follow up required by staff      Vocational Rehabilitation: Provide vocational rehab assistance to qualifying candidates.   Vocational Rehab Evaluation & Intervention:     Vocational Rehab -  11/11/16 1658      Initial Vocational Rehab Evaluation & Intervention   Assessment shows need for Vocational Rehabilitation No      Education: Education Goals: Education classes will be provided on a weekly basis, covering required topics. Participant will state understanding/return demonstration of topics presented.  Learning Barriers/Preferences:     Learning Barriers/Preferences - 11/11/16 0935      Learning Barriers/Preferences   Learning Barriers Sight   Learning Preferences Written Material;Skilled Demonstration      Education Topics: Count Your Pulse:  -Group instruction provided by verbal instruction, demonstration, patient participation and written materials to support subject.  Instructors address importance of being able to find your pulse and how to count your pulse when at home without a heart monitor.  Patients get hands on experience counting their pulse with staff help and individually.   Heart Attack, Angina, and Risk Factor Modification:  -Group instruction provided by verbal instruction, video, and written materials to support subject.  Instructors address signs and symptoms of angina and heart attacks.    Also discuss risk factors for heart disease and how to make changes to improve heart health risk factors.   CARDIAC REHAB PHASE II EXERCISE from 02/09/2017 in [place]  Date  12/22/16  Instruction Review Code  2- meets goals/outcomes      Functional Fitness:  -Group instruction provided by verbal instruction, demonstration, patient participation, and written materials to support subject.  Instructors address safety measures for doing things around the house.  Discuss how to get up and down off the floor, how to pick things up properly, how to safely get out of a chair without assistance, and balance training.   Meditation and Mindfulness:  -Group instruction provided by verbal instruction, patient participation, and written  materials to support subject.  Instructor addresses importance of mindfulness and meditation practice to help reduce stress and improve awareness.  Instructor also leads participants through a meditation exercise.    CARDIAC REHAB PHASE II EXERCISE from 02/09/2017 in [place]  Date  01/05/17  Instruction Review Code  2- meets goals/outcomes      Stretching for Flexibility and Mobility:  -Group instruction provided by verbal instruction, patient participation, and written materials to support subject.  Instructors lead participants through series of stretches that are designed to increase flexibility thus improving mobility.  These stretches are additional exercise for major muscle groups that are typically performed during regular warm up and cool down.   Hands Only CPR:  -Group verbal, video, and participation provides a basic overview of AHA guidelines for community CPR. Role-play of emergencies allow participants the opportunity to practice calling for help and chest compression technique with discussion of AED use.   Hypertension: -Group verbal and written instruction that provides a basic overview of hypertension including the most recent diagnostic guidelines, risk factor reduction with self-care instructions and medication management.    Nutrition I class: Heart Healthy Eating:  -Group instruction provided by PowerPoint slides, verbal discussion, and written materials to support subject matter. The instructor gives an explanation and review of the Therapeutic Lifestyle Changes diet recommendations, which includes a discussion on lipid goals, dietary fat, sodium, fiber, plant stanol/sterol esters, sugar, and the components of a well-balanced, healthy diet.   Nutrition II class: Lifestyle Skills:  -Group instruction provided by PowerPoint slides, verbal discussion, and written materials to support subject matter. The instructor gives an explanation and  review of label reading, grocery shopping for heart health, heart healthy recipe modifications, and ways to make healthier choices when eating out.   Diabetes Question & Answer:  -Group instruction provided by PowerPoint slides, verbal discussion, and written materials to support subject matter. The instructor gives an explanation and review of diabetes co-morbidities, pre- and post-prandial blood glucose goals, pre-exercise blood glucose goals, signs, symptoms, and treatment of hypoglycemia and hyperglycemia, and foot care basics.   Diabetes Blitz:  -Group instruction provided by PowerPoint slides, verbal discussion, and written materials to support subject matter. The instructor gives an explanation and review of the physiology behind type 1 and type 2 diabetes, diabetes medications and rational behind using different medications, pre- and post-prandial blood glucose recommendations and Hemoglobin A1c goals, diabetes diet, and exercise including blood glucose guidelines for exercising safely.    Portion Distortion:  -Group instruction provided by PowerPoint slides, verbal discussion, written materials, and food models to support subject matter. The instructor gives an explanation of serving size versus portion size, changes in portions sizes over the last 20 years, and what consists of a serving from each food group.   CARDIAC REHAB PHASE II EXERCISE from 02/09/2017 in [place]  Date  01/19/17  Educator  RD  Instruction Review Code  2- meets goals/outcomes      Stress Management:  -Group instruction provided by verbal instruction, video, and written materials to support subject matter.  Instructors review role of stress in heart disease and how to cope with stress positively.     CARDIAC REHAB PHASE II EXERCISE from 02/09/2017 in [place]  Date  01/26/17  Instruction Review Code  2- meets goals/outcomes      Exercising on  Your Own:  -Group instruction provided by verbal instruction, power point, and written materials to support subject.  Instructors discuss benefits of exercise, components of exercise, frequency and intensity of exercise, and end points for exercise.  Also discuss use of nitroglycerin and activating EMS.  Review options of places to exercise outside of rehab.  Review guidelines for sex with  heart disease.   CARDIAC REHAB PHASE II EXERCISE from 02/09/2017 in [place]  Date  12/15/16  Instruction Review Code  2- meets goals/outcomes      Cardiac Drugs I:  -Group instruction provided by verbal instruction and written materials to support subject.  Instructor reviews cardiac drug classes: antiplatelets, anticoagulants, beta blockers, and statins.  Instructor discusses reasons, side effects, and lifestyle considerations for each drug class.   Cardiac Drugs II:  -Group instruction provided by verbal instruction and written materials to support subject.  Instructor reviews cardiac drug classes: angiotensin converting enzyme inhibitors (ACE-I), angiotensin II receptor blockers (ARBs), nitrates, and calcium channel blockers.  Instructor discusses reasons, side effects, and lifestyle considerations for each drug class.   CARDIAC REHAB PHASE II EXERCISE from 02/09/2017 in [place]  Date  02/09/17  Educator  Kennyth Lose  Instruction Review Code  2- meets goals/outcomes      Anatomy and Physiology of the Circulatory System:  Group verbal and written instruction and models provide basic cardiac anatomy and physiology, with the coronary electrical and arterial systems. Review of: AMI, Angina, Valve disease, Heart Failure, Peripheral Artery Disease, Cardiac Arrhythmia, Pacemakers, and the ICD.   Other Education:  -Group or individual verbal, written, or video instructions that support the educational goals of the cardiac rehab  program.   Knowledge Questionnaire Score:     Knowledge Questionnaire Score - 11/11/16 1238      Knowledge Questionnaire Score   Pre Score 18/24      Core Components/Risk Factors/Patient Goals at Admission:     Personal Goals and Risk Factors at Admission - 11/11/16 1654      Core Components/Risk Factors/Patient Goals on Admission    Weight Management Yes   Intervention Weight Management: Develop a combined nutrition and exercise program designed to reach desired caloric intake, while maintaining appropriate intake of nutrient and fiber, sodium and fats, and appropriate energy expenditure required for the weight goal.;Weight Management: Provide education and appropriate resources to help participant work on and attain dietary goals.;Weight Management/Obesity: Establish reasonable short term and long term weight goals.;Obesity: Provide education and appropriate resources to help participant work on and attain dietary goals.   Expected Outcomes Short Term: Continue to assess and modify interventions until short term weight is achieved;Long Term: Adherence to nutrition and physical activity/exercise program aimed toward attainment of established weight goal;Weight Loss: Understanding of general recommendations for a balanced deficit meal plan, which promotes 1-2 lb weight loss per week and includes a negative energy balance of [phone] kcal/d;Weight Maintenance: Understanding of the daily nutrition guidelines, which includes 25-35% calories from fat, 7% or less cal from saturated fats, less than 200mg  cholesterol, less than 1.5gm of sodium, & 5 or more servings of fruits and vegetables daily;Understanding recommendations for meals to include 15-35% energy as protein, 25-35% energy from fat, 35-60% energy from carbohydrates, less than 200mg  of dietary cholesterol, 20-35 gm of total fiber daily;Understanding of distribution of calorie intake throughout the day with the consumption of 4-5 meals/snacks    Hypertension Yes   Intervention Provide education on lifestyle modifcations including regular physical activity/exercise, weight management, moderate sodium restriction and increased consumption of fresh fruit, vegetables, and low fat dairy, alcohol moderation, and smoking cessation.;Monitor prescription use compliance.   Expected Outcomes Short Term: Continued assessment and intervention until BP is < 140/16mm HG in hypertensive participants. < 130/70mm HG in hypertensive participants with diabetes, heart failure or chronic kidney disease.;Long Term: Maintenance  of blood pressure at goal levels.   Lipids Yes   Intervention Provide education and support for participant on nutrition & aerobic/resistive exercise along with prescribed medications to achieve LDL 70mg , HDL >40mg .   Expected Outcomes Short Term: Participant states understanding of desired cholesterol values and is compliant with medications prescribed. Participant is following exercise prescription and nutrition guidelines.;Long Term: Cholesterol controlled with medications as prescribed, with individualized exercise RX and with personalized nutrition plan. Value goals: LDL < 70mg , HDL > 40 mg.      Core Components/Risk Factors/Patient Goals Review:      Goals and Risk Factor Review    Row Name 11/24/16 1711 12/22/16 1526 01/10/17 1726 02/09/17 1716       Core Components/Risk Factors/Patient Goals Review   Personal Goals Review Weight Management/Obesity;Lipids;Hypertension Weight Management/Obesity;Lipids;Hypertension Weight Management/Obesity;Lipids;Hypertension Weight Management/Obesity;Lipids;Hypertension    Review pt exhibits positive outlook about CR participation.  pt exhibits positive outlook about CR participation. pt is currently exercising on her own at [facility].  pt is eager to resume water aerobics. pt is also eager to lose weight and expresses disappointment she has not lost weight yet. pt encouraged to continue  nutrition and exercise guidelines to facilitate weight loss goals.  pt exhibits positive outlook about CR participation. pt is currently exercising on her own at [facility].  pt is concerend about persistent fatigue being thyroid etiology, she will review with her PCP.   pt is eager to lose weight and expresses disappointment she has not lost weight yet., although she is controlling porition sizes and content.   pt encouraged to continue nutrition and exercise guidelines to facilitate weight loss goals.  pt exhibits positive outlook about CR participation. pt is currently exercising on her own at [facility].  pt is concerend about persistent fatigue being thyroid etiology, she will review with her PCP.   pt is eager to lose weight and expresses disappointment she has not lost weight yet., although she is controlling porition sizes and content.   pt encouraged to continue nutrition and exercise guidelines to facilitate weight loss goals.     Expected Outcomes pt will participate in cardiac rehab exercise, nutrition and lifestyle modification education classes to decrease overall CAD risk factors.  pt will participate in cardiac rehab exercise, nutrition and lifestyle modification education classes to decrease overall CAD risk factors.  pt will participate in cardiac rehab exercise, nutrition and lifestyle modification education classes to decrease overall CAD risk factors.  pt will participate in cardiac rehab exercise, nutrition and lifestyle modification education classes to decrease overall CAD risk factors.        Core Components/Risk Factors/Patient Goals at Discharge (Final Review):      Goals and Risk Factor Review - 02/09/17 1716      Core Components/Risk Factors/Patient Goals Review   Personal Goals Review Weight Management/Obesity;Lipids;Hypertension   Review pt exhibits positive outlook about CR participation. pt is currently exercising on her own at [facility].  pt is concerend about  persistent fatigue being thyroid etiology, she will review with her PCP.   pt is eager to lose weight and expresses disappointment she has not lost weight yet., although she is controlling porition sizes and content.   pt encouraged to continue nutrition and exercise guidelines to facilitate weight loss goals.    Expected Outcomes pt will participate in cardiac rehab exercise, nutrition and lifestyle modification education classes to decrease overall CAD risk factors.       ITP Comments:  ITP Comments    Row Name 11/11/16 0932 01/18/17 1549 02/15/17 1613       ITP Comments Medical Director, Dr. Fransico Him Medical Director, Dr. Fransico Him Medical Director, Dr. Fransico Him        Comments: Pt is making expected progress toward personal goals after completing 21 sessions. Recommend continued exercise and life style modification education including  stress management and relaxation techniques to decrease cardiac risk profile.

## 2017-02-16 ENCOUNTER — Encounter (HOSPITAL_COMMUNITY)
Admission: RE | Admit: 2017-02-16 | Discharge: 2017-02-16 | Disposition: A | Discharge status: Home / Self Care | Payer: Federal, State, Local not specified - PPO | Source: Ambulatory Visit | Attending: Cardiovascular Disease | Admitting: Cardiovascular Disease

## 2017-02-16 DIAGNOSIS — I208 Other forms of angina pectoris: Secondary | ICD-10-CM

## 2017-02-18 ENCOUNTER — Encounter (HOSPITAL_COMMUNITY): Payer: Federal, State, Local not specified - PPO

## 2017-02-21 ENCOUNTER — Encounter (HOSPITAL_COMMUNITY)
Admission: RE | Admit: 2017-02-21 | Discharge: 2017-02-21 | Disposition: A | Discharge status: Home / Self Care | Payer: Federal, State, Local not specified - PPO | Source: Ambulatory Visit | Attending: Cardiovascular Disease | Admitting: Cardiovascular Disease

## 2017-02-21 DIAGNOSIS — K589 Irritable bowel syndrome without diarrhea: Secondary | ICD-10-CM | POA: Diagnosis not present

## 2017-02-21 DIAGNOSIS — Z79899 Other long term (current) drug therapy: Secondary | ICD-10-CM | POA: Diagnosis not present

## 2017-02-21 DIAGNOSIS — I10 Essential (primary) hypertension: Secondary | ICD-10-CM | POA: Diagnosis not present

## 2017-02-21 DIAGNOSIS — F419 Anxiety disorder, unspecified: Secondary | ICD-10-CM | POA: Diagnosis not present

## 2017-02-21 DIAGNOSIS — I208 Other forms of angina pectoris: Secondary | ICD-10-CM

## 2017-02-21 DIAGNOSIS — Z7982 Long term (current) use of aspirin: Secondary | ICD-10-CM | POA: Diagnosis not present

## 2017-02-21 DIAGNOSIS — I252 Old myocardial infarction: Secondary | ICD-10-CM | POA: Diagnosis not present

## 2017-02-21 DIAGNOSIS — K219 Gastro-esophageal reflux disease without esophagitis: Secondary | ICD-10-CM | POA: Diagnosis not present

## 2017-02-21 DIAGNOSIS — E109 Type 1 diabetes mellitus without complications: Secondary | ICD-10-CM | POA: Diagnosis not present

## 2017-02-21 DIAGNOSIS — I2089 Other forms of angina pectoris: Secondary | ICD-10-CM

## 2017-02-21 DIAGNOSIS — I25118 Atherosclerotic heart disease of native coronary artery with other forms of angina pectoris: Secondary | ICD-10-CM | POA: Diagnosis not present

## 2017-02-21 DIAGNOSIS — E039 Hypothyroidism, unspecified: Secondary | ICD-10-CM | POA: Diagnosis not present

## 2017-02-21 DIAGNOSIS — Z7902 Long term (current) use of antithrombotics/antiplatelets: Secondary | ICD-10-CM | POA: Diagnosis not present

## 2017-02-23 ENCOUNTER — Encounter (HOSPITAL_COMMUNITY)
Admission: RE | Admit: 2017-02-23 | Discharge: 2017-02-23 | Disposition: A | Discharge status: Home / Self Care | Payer: Federal, State, Local not specified - PPO | Source: Ambulatory Visit | Attending: Cardiovascular Disease | Admitting: Cardiovascular Disease

## 2017-02-23 DIAGNOSIS — F419 Anxiety disorder, unspecified: Secondary | ICD-10-CM | POA: Diagnosis not present

## 2017-02-23 DIAGNOSIS — I2089 Other forms of angina pectoris: Secondary | ICD-10-CM

## 2017-02-23 DIAGNOSIS — K589 Irritable bowel syndrome without diarrhea: Secondary | ICD-10-CM | POA: Diagnosis not present

## 2017-02-23 DIAGNOSIS — Z7982 Long term (current) use of aspirin: Secondary | ICD-10-CM | POA: Diagnosis not present

## 2017-02-23 DIAGNOSIS — I25118 Atherosclerotic heart disease of native coronary artery with other forms of angina pectoris: Secondary | ICD-10-CM | POA: Diagnosis not present

## 2017-02-23 DIAGNOSIS — E109 Type 1 diabetes mellitus without complications: Secondary | ICD-10-CM | POA: Diagnosis not present

## 2017-02-23 DIAGNOSIS — I10 Essential (primary) hypertension: Secondary | ICD-10-CM | POA: Diagnosis not present

## 2017-02-23 DIAGNOSIS — K219 Gastro-esophageal reflux disease without esophagitis: Secondary | ICD-10-CM | POA: Diagnosis not present

## 2017-02-23 DIAGNOSIS — Z7902 Long term (current) use of antithrombotics/antiplatelets: Secondary | ICD-10-CM | POA: Diagnosis not present

## 2017-02-23 DIAGNOSIS — Z79899 Other long term (current) drug therapy: Secondary | ICD-10-CM | POA: Diagnosis not present

## 2017-02-23 DIAGNOSIS — I208 Other forms of angina pectoris: Secondary | ICD-10-CM

## 2017-02-23 DIAGNOSIS — E039 Hypothyroidism, unspecified: Secondary | ICD-10-CM | POA: Diagnosis not present

## 2017-02-23 DIAGNOSIS — I252 Old myocardial infarction: Secondary | ICD-10-CM | POA: Diagnosis not present

## 2017-02-25 ENCOUNTER — Encounter (HOSPITAL_COMMUNITY): Payer: Federal, State, Local not specified - PPO

## 2017-02-28 ENCOUNTER — Other Ambulatory Visit: Payer: Self-pay | Admitting: Cardiovascular Disease

## 2017-02-28 ENCOUNTER — Encounter (HOSPITAL_COMMUNITY)
Admission: RE | Admit: 2017-02-28 | Discharge: 2017-02-28 | Disposition: A | Discharge status: Home / Self Care | Payer: Federal, State, Local not specified - PPO | Source: Ambulatory Visit | Attending: Cardiovascular Disease | Admitting: Cardiovascular Disease

## 2017-02-28 DIAGNOSIS — I10 Essential (primary) hypertension: Secondary | ICD-10-CM | POA: Diagnosis not present

## 2017-02-28 DIAGNOSIS — Z79899 Other long term (current) drug therapy: Secondary | ICD-10-CM | POA: Diagnosis not present

## 2017-02-28 DIAGNOSIS — Z7982 Long term (current) use of aspirin: Secondary | ICD-10-CM | POA: Diagnosis not present

## 2017-02-28 DIAGNOSIS — I252 Old myocardial infarction: Secondary | ICD-10-CM | POA: Diagnosis not present

## 2017-02-28 DIAGNOSIS — Z7902 Long term (current) use of antithrombotics/antiplatelets: Secondary | ICD-10-CM | POA: Diagnosis not present

## 2017-02-28 DIAGNOSIS — K589 Irritable bowel syndrome without diarrhea: Secondary | ICD-10-CM | POA: Diagnosis not present

## 2017-02-28 DIAGNOSIS — F419 Anxiety disorder, unspecified: Secondary | ICD-10-CM | POA: Diagnosis not present

## 2017-02-28 DIAGNOSIS — I208 Other forms of angina pectoris: Secondary | ICD-10-CM

## 2017-02-28 DIAGNOSIS — E039 Hypothyroidism, unspecified: Secondary | ICD-10-CM | POA: Diagnosis not present

## 2017-02-28 DIAGNOSIS — K219 Gastro-esophageal reflux disease without esophagitis: Secondary | ICD-10-CM | POA: Diagnosis not present

## 2017-02-28 DIAGNOSIS — E109 Type 1 diabetes mellitus without complications: Secondary | ICD-10-CM | POA: Diagnosis not present

## 2017-02-28 DIAGNOSIS — I25118 Atherosclerotic heart disease of native coronary artery with other forms of angina pectoris: Secondary | ICD-10-CM | POA: Diagnosis not present

## 2017-03-01 DIAGNOSIS — K080 Exfoliation of teeth due to systemic causes: Secondary | ICD-10-CM | POA: Diagnosis not present

## 2017-03-02 ENCOUNTER — Encounter (HOSPITAL_COMMUNITY)
Admission: RE | Admit: 2017-03-02 | Discharge: 2017-03-02 | Disposition: A | Discharge status: Home / Self Care | Payer: Federal, State, Local not specified - PPO | Source: Ambulatory Visit | Attending: Cardiovascular Disease | Admitting: Cardiovascular Disease

## 2017-03-02 DIAGNOSIS — I252 Old myocardial infarction: Secondary | ICD-10-CM | POA: Insufficient documentation

## 2017-03-02 DIAGNOSIS — I25118 Atherosclerotic heart disease of native coronary artery with other forms of angina pectoris: Secondary | ICD-10-CM | POA: Insufficient documentation

## 2017-03-02 DIAGNOSIS — E109 Type 1 diabetes mellitus without complications: Secondary | ICD-10-CM | POA: Insufficient documentation

## 2017-03-02 DIAGNOSIS — Z79899 Other long term (current) drug therapy: Secondary | ICD-10-CM | POA: Diagnosis not present

## 2017-03-02 DIAGNOSIS — Z7902 Long term (current) use of antithrombotics/antiplatelets: Secondary | ICD-10-CM | POA: Diagnosis not present

## 2017-03-02 DIAGNOSIS — I10 Essential (primary) hypertension: Secondary | ICD-10-CM | POA: Insufficient documentation

## 2017-03-02 DIAGNOSIS — F419 Anxiety disorder, unspecified: Secondary | ICD-10-CM | POA: Insufficient documentation

## 2017-03-02 DIAGNOSIS — K219 Gastro-esophageal reflux disease without esophagitis: Secondary | ICD-10-CM | POA: Insufficient documentation

## 2017-03-02 DIAGNOSIS — K589 Irritable bowel syndrome without diarrhea: Secondary | ICD-10-CM | POA: Insufficient documentation

## 2017-03-02 DIAGNOSIS — I208 Other forms of angina pectoris: Secondary | ICD-10-CM

## 2017-03-02 DIAGNOSIS — Z7982 Long term (current) use of aspirin: Secondary | ICD-10-CM | POA: Diagnosis not present

## 2017-03-02 DIAGNOSIS — E039 Hypothyroidism, unspecified: Secondary | ICD-10-CM | POA: Insufficient documentation

## 2017-03-04 ENCOUNTER — Encounter (HOSPITAL_COMMUNITY): Payer: Federal, State, Local not specified - PPO

## 2017-03-07 ENCOUNTER — Encounter (HOSPITAL_COMMUNITY)
Admission: RE | Admit: 2017-03-07 | Discharge: 2017-03-07 | Disposition: A | Discharge status: Home / Self Care | Payer: Federal, State, Local not specified - PPO | Source: Ambulatory Visit | Attending: Cardiovascular Disease | Admitting: Cardiovascular Disease

## 2017-03-07 DIAGNOSIS — I2089 Other forms of angina pectoris: Secondary | ICD-10-CM

## 2017-03-07 DIAGNOSIS — Z79899 Other long term (current) drug therapy: Secondary | ICD-10-CM | POA: Diagnosis not present

## 2017-03-07 DIAGNOSIS — K219 Gastro-esophageal reflux disease without esophagitis: Secondary | ICD-10-CM | POA: Diagnosis not present

## 2017-03-07 DIAGNOSIS — I252 Old myocardial infarction: Secondary | ICD-10-CM | POA: Diagnosis not present

## 2017-03-07 DIAGNOSIS — Z7902 Long term (current) use of antithrombotics/antiplatelets: Secondary | ICD-10-CM | POA: Diagnosis not present

## 2017-03-07 DIAGNOSIS — K589 Irritable bowel syndrome without diarrhea: Secondary | ICD-10-CM | POA: Diagnosis not present

## 2017-03-07 DIAGNOSIS — Z7982 Long term (current) use of aspirin: Secondary | ICD-10-CM | POA: Diagnosis not present

## 2017-03-07 DIAGNOSIS — E039 Hypothyroidism, unspecified: Secondary | ICD-10-CM | POA: Diagnosis not present

## 2017-03-07 DIAGNOSIS — I10 Essential (primary) hypertension: Secondary | ICD-10-CM | POA: Diagnosis not present

## 2017-03-07 DIAGNOSIS — F419 Anxiety disorder, unspecified: Secondary | ICD-10-CM | POA: Diagnosis not present

## 2017-03-07 DIAGNOSIS — E109 Type 1 diabetes mellitus without complications: Secondary | ICD-10-CM | POA: Diagnosis not present

## 2017-03-07 DIAGNOSIS — I208 Other forms of angina pectoris: Secondary | ICD-10-CM

## 2017-03-07 DIAGNOSIS — I25118 Atherosclerotic heart disease of native coronary artery with other forms of angina pectoris: Secondary | ICD-10-CM | POA: Diagnosis not present

## 2017-03-09 ENCOUNTER — Encounter (HOSPITAL_COMMUNITY)
Admission: RE | Admit: 2017-03-09 | Discharge: 2017-03-09 | Disposition: A | Discharge status: Home / Self Care | Payer: Federal, State, Local not specified - PPO | Source: Ambulatory Visit | Attending: Cardiovascular Disease | Admitting: Cardiovascular Disease

## 2017-03-09 DIAGNOSIS — Z7982 Long term (current) use of aspirin: Secondary | ICD-10-CM | POA: Diagnosis not present

## 2017-03-09 DIAGNOSIS — Z7902 Long term (current) use of antithrombotics/antiplatelets: Secondary | ICD-10-CM | POA: Diagnosis not present

## 2017-03-09 DIAGNOSIS — E109 Type 1 diabetes mellitus without complications: Secondary | ICD-10-CM | POA: Diagnosis not present

## 2017-03-09 DIAGNOSIS — I208 Other forms of angina pectoris: Secondary | ICD-10-CM

## 2017-03-09 DIAGNOSIS — I252 Old myocardial infarction: Secondary | ICD-10-CM | POA: Diagnosis not present

## 2017-03-09 DIAGNOSIS — K589 Irritable bowel syndrome without diarrhea: Secondary | ICD-10-CM | POA: Diagnosis not present

## 2017-03-09 DIAGNOSIS — F419 Anxiety disorder, unspecified: Secondary | ICD-10-CM | POA: Diagnosis not present

## 2017-03-09 DIAGNOSIS — K219 Gastro-esophageal reflux disease without esophagitis: Secondary | ICD-10-CM | POA: Diagnosis not present

## 2017-03-09 DIAGNOSIS — I25118 Atherosclerotic heart disease of native coronary artery with other forms of angina pectoris: Secondary | ICD-10-CM | POA: Diagnosis not present

## 2017-03-09 DIAGNOSIS — E039 Hypothyroidism, unspecified: Secondary | ICD-10-CM | POA: Diagnosis not present

## 2017-03-09 DIAGNOSIS — I10 Essential (primary) hypertension: Secondary | ICD-10-CM | POA: Diagnosis not present

## 2017-03-09 DIAGNOSIS — Z79899 Other long term (current) drug therapy: Secondary | ICD-10-CM | POA: Diagnosis not present

## 2017-03-11 ENCOUNTER — Encounter (HOSPITAL_COMMUNITY): Payer: Federal, State, Local not specified - PPO

## 2017-03-14 ENCOUNTER — Encounter (HOSPITAL_COMMUNITY)
Admission: RE | Admit: 2017-03-14 | Discharge: 2017-03-14 | Disposition: A | Discharge status: Home / Self Care | Payer: Federal, State, Local not specified - PPO | Source: Ambulatory Visit | Attending: Cardiovascular Disease | Admitting: Cardiovascular Disease

## 2017-03-14 VITALS — Ht 62.0 in | Wt 212.5 lb

## 2017-03-14 DIAGNOSIS — Z7982 Long term (current) use of aspirin: Secondary | ICD-10-CM | POA: Diagnosis not present

## 2017-03-14 DIAGNOSIS — K589 Irritable bowel syndrome without diarrhea: Secondary | ICD-10-CM | POA: Diagnosis not present

## 2017-03-14 DIAGNOSIS — E039 Hypothyroidism, unspecified: Secondary | ICD-10-CM | POA: Diagnosis not present

## 2017-03-14 DIAGNOSIS — F419 Anxiety disorder, unspecified: Secondary | ICD-10-CM | POA: Diagnosis not present

## 2017-03-14 DIAGNOSIS — I25118 Atherosclerotic heart disease of native coronary artery with other forms of angina pectoris: Secondary | ICD-10-CM | POA: Diagnosis not present

## 2017-03-14 DIAGNOSIS — Z7902 Long term (current) use of antithrombotics/antiplatelets: Secondary | ICD-10-CM | POA: Diagnosis not present

## 2017-03-14 DIAGNOSIS — I252 Old myocardial infarction: Secondary | ICD-10-CM | POA: Diagnosis not present

## 2017-03-14 DIAGNOSIS — E109 Type 1 diabetes mellitus without complications: Secondary | ICD-10-CM | POA: Diagnosis not present

## 2017-03-14 DIAGNOSIS — Z79899 Other long term (current) drug therapy: Secondary | ICD-10-CM | POA: Diagnosis not present

## 2017-03-14 DIAGNOSIS — K219 Gastro-esophageal reflux disease without esophagitis: Secondary | ICD-10-CM | POA: Diagnosis not present

## 2017-03-14 DIAGNOSIS — I208 Other forms of angina pectoris: Secondary | ICD-10-CM

## 2017-03-14 DIAGNOSIS — I10 Essential (primary) hypertension: Secondary | ICD-10-CM | POA: Diagnosis not present

## 2017-03-17 NOTE — Progress Notes (Signed)
Cardiac Individual Treatment Plan  Patient Details  Name: [name] MRN: [id number] Date of Birth: 1955-11-21 Referring Provider:     CARDIAC REHAB PHASE II ORIENTATION from 11/11/2016 in [place]  Referring Provider  Quay Burow MD      Initial Encounter Date:    CARDIAC REHAB PHASE II ORIENTATION from 11/11/2016 in [place]  Date  11/11/16  Referring Provider  Quay Burow MD      Visit Diagnosis: Stable angina [facility])  Patient's Home Medications on Admission:  Current Outpatient Prescriptions:  .  ALPRAZolam (XANAX) 0.5 MG tablet, Take 0.5 mg by mouth daily. , Disp: , Rfl:  .  amLODipine (NORVASC) 5 MG tablet, Take 1 tablet (5 mg total) by mouth daily., Disp: 30 tablet, Rfl: 11 .  amoxicillin (AMOXIL) 500 MG capsule, Take 1 capsule (500 mg total) by mouth 3 (three) times daily., Disp: 30 capsule, Rfl: 0 .  aspirin EC 81 MG EC tablet, Take 1 tablet (81 mg total) by mouth daily., Disp: , Rfl:  .  clopidogrel (PLAVIX) 75 MG tablet, TAKE 1 TABLET BY MOUTH ONCE A DAY WITH BREAKFAST, Disp: 30 tablet, Rfl: 10 .  estradiol (ESTRACE) 1 MG tablet, Take 1 mg by mouth daily., Disp: , Rfl:  .  isosorbide mononitrate (IMDUR) 60 MG 24 hr tablet, TAKE 1 TABLET BY MOUTH ONCE A DAY, Disp: 30 tablet, Rfl: 11 .  lisinopril (PRINIVIL,ZESTRIL) 5 MG tablet, TAKE 1 TABLET BY MOUTH DAILY., Disp: 90 tablet, Rfl: 2 .  metoprolol tartrate (LOPRESSOR) 25 MG tablet, TAKE 1 TABLET BY MOUTH TWICE A DAY, Disp: 60 tablet, Rfl: 11 .  nitroGLYCERIN (NITROSTAT) 0.4 MG SL tablet, Place 1 tablet (0.4 mg total) under the tongue every 5 (five) minutes as needed for chest pain., Disp: 25 tablet, Rfl: 5 .  pantoprazole (PROTONIX) 40 MG tablet, TAKE 1 TABLET BY MOUTH TWICE A DAY, Disp: 60 tablet, Rfl: 10 .  RANEXA 1000 MG SR tablet, TAKE 1 TABLET BY MOUTH 2 TIMES DAILY., Disp: 180 tablet, Rfl: 2 .  rosuvastatin (CRESTOR) 20 MG tablet, Take 1  tablet (20 mg total) by mouth at bedtime., Disp: 90 tablet, Rfl: 3  Past Medical History: Past Medical History:  Diagnosis Date  . Angina effort ([place]) 05/22/2013  . Anxiety   . CAD (coronary artery disease), possible SCAD 12/14/2012  . Colon polyps   . Duodenitis   . Dyspnea on exertion    LEXISCAN, 09/30/2008 - normal, EKG negative for ischemia, no ECG changes  . Esophageal stricture   . Gastritis   . GERD (gastroesophageal reflux disease)   . Gout    "only from RX given after MI" (01/22/2013)  . H/O hiatal hernia   . History of esophageal stricture   . Hypertension    2D ECHO, 09/30/2008 - EF >55%, normal: RENAL DOPPLER, 03/21/2007 - normal duplex study  . Hypothyroidism   . IBS (irritable bowel syndrome)   . Ischemic colitis ([place])   . Lower GI bleeding    10/07/11 "I've had 3 episodes in the past year"  . Myocardial infarction [facility]) 12/10/2012   "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)  . Tubular adenoma polyp of rectum 2008    Tobacco Use: History  Smoking Status  . Never Smoker  Smokeless Tobacco  . Never Used    Labs: Recent Review Flowsheet Data    Labs for ITP Cardiac and Pulmonary Rehab Latest Ref Rng & Units 04/15/2014 02/06/2015  02/07/2015 05/20/2015 01/12/2017   Cholestrol 0 - 200 mg/dL 184 - 265(H) 298(H) 239(H)   LDLCALC <130 mg/dL 92 - 167(H) 204(H) -   LDLDIRECT mg/dL - - - - 128.0   HDL >39.00 mg/dL 63 - 60 61 56.40   Trlycerides 0.0 - 149.0 mg/dL 145 - 192(H) 166(H) 247.0(H)   Hemoglobin A1c 4.6 - 6.5 % - 5.3 - - 5.7      Capillary Blood Glucose: Lab Results  Component Value Date   GLUCAP 97 12/12/2012   GLUCAP 91 12/12/2012   GLUCAP 81 10/03/2008     Exercise Target Goals:    Exercise Program Goal: Individual exercise prescription set with THRR, safety & activity barriers. Participant demonstrates ability to understand and report RPE using BORG scale, to self-measure pulse accurately, and to acknowledge the importance of the exercise  prescription.  Exercise Prescription Goal: Starting with aerobic activity 30 plus minutes a day, 3 days per week for initial exercise prescription. Provide home exercise prescription and guidelines that participant acknowledges understanding prior to discharge.  Activity Barriers & Risk Stratification:     Activity Barriers & Cardiac Risk Stratification - 11/11/16 0932      Activity Barriers & Cardiac Risk Stratification   Activity Barriers Deconditioning;Muscular Weakness   Cardiac Risk Stratification High      6 Minute Walk:     6 Minute Walk    Row Name 11/11/16 1239         6 Minute Walk   Phase Initial     Distance 1350 feet     Walk Time 6 minutes     # of Rest Breaks 0     MPH 2.6     METS 2.8     RPE 9     VO2 Peak 9.7     Symptoms No     Resting HR 72 bpm     Resting BP 118/82     Max Ex. HR 95 bpm     Max Ex. BP 138/84     2 Minute Post BP 102/80        Oxygen Initial Assessment:   Oxygen Re-Evaluation:   Oxygen Discharge (Final Oxygen Re-Evaluation):   Initial Exercise Prescription:     Initial Exercise Prescription - 11/11/16 1200      Date of Initial Exercise RX and Referring Provider   Date 11/11/16   Referring Provider Quay Burow MD     Treadmill   MPH 2   Grade 0   Minutes 10   METs 2.53     Bike   Level 0.6   Minutes 10   METs 2.13     NuStep   Level 1   Minutes 10   METs 1.8     Prescription Details   Frequency (times per week) 5   Duration Progress to 45 minutes of aerobic exercise without signs/symptoms of physical distress     Intensity   THRR 40-80% of Max Heartrate 64-128   Ratings of Perceived Exertion 11-13   Perceived Dyspnea 0-4     Progression   Progression Continue to progress workloads to maintain intensity without signs/symptoms of physical distress.     Resistance Training   Training Prescription Yes   Weight 2   Reps 10-15      Perform Capillary Blood Glucose checks as  needed.  Exercise Prescription Changes:     Exercise Prescription Changes    Row Name 11/22/16 1600 12/06/16 1000 12/22/16 1000 01/06/17 1200 01/26/17  1425     Response to Exercise   Blood Pressure (Admit) 112/70 110/70 122/82 128/72 122/74   Blood Pressure (Exercise) 160/80 128/80 124/72 142/82 140/70   Blood Pressure (Exit) 112/70 116/78 104/62 114/70 100/60   Heart Rate (Admit) 93 bpm 65 bpm 68 bpm 70 bpm 71 bpm   Heart Rate (Exercise) 100 bpm 100 bpm 104 bpm 96 bpm 95 bpm   Heart Rate (Exit) 91 bpm 80 bpm 66 bpm 70 bpm 71 bpm   Rating of Perceived Exertion (Exercise) 13 13 14 12 14    Symptoms none none none none none   Comments Pt is tolerating exercise in cardiac rehab very well; will continue to monitor pt's progress adjusted Ex Rx. pt moved from airdyne bike to recumbent bike due to comfort  -  -  -   Duration Continue with 30 min of aerobic exercise without signs/symptoms of physical distress. Continue with 30 min of aerobic exercise without signs/symptoms of physical distress. Continue with 30 min of aerobic exercise without signs/symptoms of physical distress. Continue with 30 min of aerobic exercise without signs/symptoms of physical distress. Continue with 30 min of aerobic exercise without signs/symptoms of physical distress.   Intensity THRR unchanged THRR unchanged THRR unchanged THRR unchanged THRR unchanged     Progression   Progression  - Continue to progress workloads to maintain intensity without signs/symptoms of physical distress. Continue to progress workloads to maintain intensity without signs/symptoms of physical distress. Continue to progress workloads to maintain intensity without signs/symptoms of physical distress. Continue to progress workloads to maintain intensity without signs/symptoms of physical distress.   Average METs  - 3 3.6 3.6 3.6     Resistance Training   Training Prescription Yes Yes Yes Yes Yes   Weight 2 3lbs 3lbs 3lbs 4lbs   Reps 10-15 10-15  10-15 10-15 10-15   Time 10 Minutes 10 Minutes 10 Minutes 10 Minutes 10 Minutes     Treadmill   MPH 2.5 2.8 2.9 2.9 3   Grade 0 1 2 2 2    Minutes 10 10 10 10 10    METs 2.91 3.53 4.02 4.02 4.02     Bike   Level 0.6  -  -  -  -   Minutes 10  -  -  -  -   METs 2.13  -  -  -  -     Recumbant Bike   Level  - 2.5 3.4  -  -   [place]  -  - 42  -  -   Minutes  - 10 10  -  -   METs  - 2.5 3.2  -  -     NuStep   Level 3 3 5 5 5    Minutes 10 10 10 10 10    METs 2.4 3 4  3.1 3.2     Arm Ergometer   Level  -  -  - 1 4   Minutes  -  -  - 10 10     Home Exercise Plan   Plans to continue exercise at  - [employer] (comment)  [place] (comment)  [place] (comment)  [place] (comment)   Frequency  - Add 3 additional days to program exercise sessions. Add 3 additional days to program exercise sessions. Add 3 additional days to program exercise sessions. Add 3 additional days to program exercise sessions.   Initial Home Exercises Provided  - 11/29/16 11/29/16 11/29/16 11/29/16  [place] Name 02/10/17 1400 02/28/17 1600 03/15/17 1200         Response to Exercise   Blood Pressure (Admit) 106/62 122/78 130/80     Blood Pressure (Exercise) 130/80 130/80 144/82     Blood Pressure (Exit) 110/78 122/68 110/70     Heart Rate (Admit) 67 bpm 69 bpm 82 bpm     Heart Rate (Exercise) 105 bpm 103 bpm 116 bpm     Heart Rate (Exit) 67 bpm 69 bpm 81 bpm     Rating of Perceived Exertion (Exercise) 13 13 12      Symptoms none none none     Duration Continue with 30 min of aerobic exercise without signs/symptoms of physical distress. Continue with 30 min of aerobic exercise without signs/symptoms of physical distress. Continue with 30 min of aerobic exercise without signs/symptoms of physical distress.     Intensity THRR unchanged THRR unchanged THRR unchanged       Progression   Progression Continue to progress workloads to maintain intensity without  signs/symptoms of physical distress. Continue to progress workloads to maintain intensity without signs/symptoms of physical distress. Continue to progress workloads to maintain intensity without signs/symptoms of physical distress.     Average METs 3.6 3.8 3.6       Resistance Training   Training Prescription Yes Yes Yes     Weight 4lbs 3lbs 4lbs     Reps 10-15 10-15 10-15     Time 10 Minutes 10 Minutes 10 Minutes       Treadmill   MPH 3 2.9 3     Grade 2 2 4      Minutes 10 10 10      METs 4.12 4.02 4.95       NuStep   Level 5 5 5      Minutes 10 10 10      METs 3 3.6 3.5       Arm Ergometer   Level 4.4 4.4 4.4     Minutes 10 10 10        Home Exercise Plan   Plans to continue exercise at [employer] (comment)  [place] (comment)  [place] (comment)  [employer]     Frequency Add 3 additional days to program exercise sessions. Add 3 additional days to program exercise sessions. Add 3 additional days to program exercise sessions.     Initial Home Exercises Provided 11/29/16 11/29/16 11/29/16        Exercise Comments:     Exercise Comments    Row Name 11/23/16 1343 12/15/16 1053 01/17/17 1105 02/10/17 1430 02/28/17 1023   Exercise Comments Reviewed METs and activity levels with pt. Pt is tolerating exercise very well; will continue to monitor exercise progression. Reviewed METs and activity levels with pt. Pt is tolerating exercise very well; will continue to monitor exercise progression. Reviewed METs and activity levels with pt. Pt is tolerating exercise very well; will continue to monitor exercise progression. Reviewed METs and goals with pt. Pt is tolerating exercise very well; will continue to monitor exercise progression. Reviewed METs and goals with pt. Pt is tolerating exercise very well; will continue to monitor exercise progression.   [place] Name 03/15/17 1247           Exercise Comments Reviewed METs and goals with pt. Pt is tolerating  exercise very well; will continue to monitor exercise progression.          Exercise Goals and Review:     Exercise Goals  [place] Name 11/11/16 0936 11/23/16 1340           Exercise Goals   Increase Physical Activity (P)  Yes  Build confidence with functional acitivites and ADL's Yes      Intervention (P)  Provide advice, education, support and counseling about physical activity/exercise needs.;Develop an individualized exercise prescription for aerobic and resistive training based on initial evaluation findings, risk stratification, comorbidities and participant's personal goals. Provide advice, education, support and counseling about physical activity/exercise needs.;Develop an individualized exercise prescription for aerobic and resistive training based on initial evaluation findings, risk stratification, comorbidities and participant's personal goals.      Expected Outcomes (P)  Achievement of increased cardiorespiratory fitness and enhanced flexibility, muscular endurance and strength shown through measurements of functional capacity and personal statement of participant. Achievement of increased cardiorespiratory fitness and enhanced flexibility, muscular endurance and strength shown through measurements of functional capacity and personal statement of participant.      Increase Strength and Stamina (P)  Yes  Be able to climb stairs and uphill without fatigue and SOB Yes      Intervention (P)  Provide advice, education, support and counseling about physical activity/exercise needs.;Develop an individualized exercise prescription for aerobic and resistive training based on initial evaluation findings, risk stratification, comorbidities and participant's personal goals. Provide advice, education, support and counseling about physical activity/exercise needs.;Develop an individualized exercise prescription for aerobic and resistive training based on initial evaluation findings, risk  stratification, comorbidities and participant's personal goals.      Expected Outcomes (P)  Achievement of increased cardiorespiratory fitness and enhanced flexibility, muscular endurance and strength shown through measurements of functional capacity and personal statement of participant. Achievement of increased cardiorespiratory fitness and enhanced flexibility, muscular endurance and strength shown through measurements of functional capacity and personal statement of participant.         Exercise Goals Re-Evaluation :     Exercise Goals Re-Evaluation    Row Name 11/23/16 1340 11/29/16 0943 12/15/16 1029 01/18/17 1708 01/18/17 1714     Exercise Goal Re-Evaluation   Exercise Goals Review Increase Physical Activity;Increase Strenth and Stamina Increase Physical Activity;Increase Strenth and Stamina Increase Physical Activity;Increase Strenth and Stamina (P)  Increase Physical Activity;Increase Strenth and Stamina Increase Physical Activity;Increase Strenth and Stamina   Comments Pt is tolerating exercise very well in cardiac rehab. Reviewed home exercise with pt today.  Pt plans to go the [facility] for exercise and use exercise equipment 2x/week in addition to coming to cardiac rehab.  Reviewed THR, pulse, RPE, sign and symptoms, NTG use, and when to call 911 or MD.  Also discussed weather considerations and indoor options.  Pt voiced understanding. Pt stated "having more energy/stamina" and "able to perform ADL's without difficulty or fatigue." Pt is exercising at [employer] 2-3x/week for 30 minutes  - Pt is tolerating exercise WL increases very well and is down in weight   Expected Outcomes Pt will continue to come 3x/week to cardiac rehab to work on increasing cardiorespiratory fitness Pt will be compliant with HEP and continue to improve in cardiorespiratory fitness Pt will be compliant with HEP and continue to improve in cardiorespiratory fitness  - Pt will be compliant with HEP and continue to  improve in cardiorespiratory fitness   Row Name 02/10/17 1428 02/28/17 1021 03/15/17 1247         Exercise Goal Re-Evaluation   Exercise Goals Review Increase Strenth and Stamina;Increase Physical Activity Increase Physical Activity;Increase Strenth and Stamina Increase Physical Activity;Increase Strenth and  Stamina     Comments Pt is weight is consistently decreasing. Pt is also very compliant with HEP and getting stronger(cardiovsacular and musculoskeletal). Pt stated "still struggling with inclines and climbing hills" Discussed incline progression on TM in cardiac rehab. Pt voiced understanding. Pt is also very compliant with HEP at [facility] 3x/week. DIscussed confiidence with exercise and fear avoidance. Pt stated "still struggling with inclines and climbing hills" Discussed incline progression on TM in cardiac rehab. Pt voiced understanding. Pt is also very compliant with HEP at [facility] 3x/week. DIscussed confiidence with exercise and fear avoidance.     Expected Outcomes Pt will be compliant with HEP and continue to improve in cardiorespiratory fitness Pt will be compliant with HEP and continue to improve in cardiorespiratory fitness and decrease fear avoidance. Pt will be compliant with HEP and continue to improve in cardiorespiratory fitness and decrease fear avoidance.         Discharge Exercise Prescription (Final Exercise Prescription Changes):     Exercise Prescription Changes - 03/15/17 1200      Response to Exercise   Blood Pressure (Admit) 130/80   Blood Pressure (Exercise) 144/82   Blood Pressure (Exit) 110/70   Heart Rate (Admit) 82 bpm   Heart Rate (Exercise) 116 bpm   Heart Rate (Exit) 81 bpm   Rating of Perceived Exertion (Exercise) 12   Symptoms none   Duration Continue with 30 min of aerobic exercise without signs/symptoms of physical distress.   Intensity THRR unchanged     Progression   Progression Continue to progress workloads to maintain intensity without  signs/symptoms of physical distress.   Average METs 3.6     Resistance Training   Training Prescription Yes   Weight 4lbs   Reps 10-15   Time 10 Minutes     Treadmill   MPH 3   Grade 4   Minutes 10   METs 4.95     NuStep   Level 5   Minutes 10   METs 3.5     Arm Ergometer   Level 4.4   Minutes 10     Home Exercise Plan   Plans to continue exercise at [employer] (comment)  [employer]   Frequency Add 3 additional days to program exercise sessions.   Initial Home Exercises Provided 11/29/16      Nutrition:  Target Goals: Understanding of nutrition guidelines, daily intake of sodium 1500mg , cholesterol 200mg , calories 30% from fat and 7% or less from saturated fats, daily to have 5 or more servings of fruits and vegetables.  Biometrics:     Pre Biometrics - 11/11/16 1245      Pre Biometrics   Waist Circumference 40.25 inches   Hip Circumference 52 inches   Waist to Hip Ratio 0.77 %   Triceps Skinfold 52 mm   % Body Fat 51.1 %   Grip Strength 35 kg   Flexibility 10 in   Single Leg Stand 8.03 seconds       Nutrition Therapy Plan and Nutrition Goals:     Nutrition Therapy & Goals - 01/26/17 1215      Nutrition Therapy   Diet Therapeutic Lifestyle Changes     Personal Nutrition Goals   Personal Goal #2 Pt to have a basic understanding of a DM diet due to A1c > 6     Intervention Plan   Intervention Prescribe, educate and counsel regarding individualized specific dietary modifications aiming towards targeted core components such as weight, hypertension, lipid management, diabetes, heart failure  and other comorbidities.;Nutrition handout(s) given to patient.  1500 kcal, 5 day menu ideas; Nutrition Therapy for Type II DM   Expected Outcomes Short Term Goal: Understand basic principles of dietary content, such as calories, fat, sodium, cholesterol and nutrients.;Long Term Goal: Adherence to prescribed nutrition plan.      Nutrition Discharge: Nutrition  Scores:     Nutrition Assessments - 12/06/16 0953      MEDFICTS Scores   Pre Score 28      Nutrition Goals Re-Evaluation:     Nutrition Goals Re-Evaluation    [place] Name 01/26/17 1215             Goals   Nutrition Goal Wt loss of 1-2 lb/week to a wt loss goal of 6-24 lb at graduation from [place] Pt has lost 5 lb since starting Cardiac Rehab.           Nutrition Goals Re-Evaluation:     Nutrition Goals Re-Evaluation    [place] Name 01/26/17 1215             Goals   Nutrition Goal Wt loss of 1-2 lb/week to a wt loss goal of 6-24 lb at graduation from [place] Pt has lost 5 lb since starting Cardiac Rehab.           Nutrition Goals Discharge (Final Nutrition Goals Re-Evaluation):     Nutrition Goals Re-Evaluation - 01/26/17 1215      Goals   Nutrition Goal Wt loss of 1-2 lb/week to a wt loss goal of 6-24 lb at graduation from [place] Pt has lost 5 lb since starting Cardiac Rehab.       Psychosocial: Target Goals: Acknowledge presence or absence of significant depression and/or stress, maximize coping skills, provide positive support system. Participant is able to verbalize types and ability to use techniques and skills needed for reducing stress and depression.  Initial Review & Psychosocial Screening:     Initial Psych Review & Screening - 11/15/16 1105      Initial Review   Current issues with Current Stress Concerns   Source of Stress Concerns Chronic Illness;Unable to participate in former interests or hobbies     [place]? Yes     Barriers   Psychosocial barriers to participate in program There are no identifiable barriers or psychosocial needs.     Screening Interventions   Interventions Encouraged to exercise      Quality of Life Scores:     Quality of Life - 11/29/16 0918      Quality of Life Scores   Health/Function Pre 18.96 %  pt concerned about  recent cardiac event and fatigue. pt has fears and worries about living with CAD.  pt is looking forward to resuming usual activities however is uncertain she can.  pt feels her fatigue is related to her thyroid.     Socioeconomic Pre 27.83 %   Psych/Spiritual Pre 14.57 %  pt displays health related anxiety from recent lifestyle changes from cardiac event.  Fatigue has resulted in decreased ability to participate in pleasurable activities.     Family Pre 22.5 %   GLOBAL Pre 20.15 %  pt offered reassurance and emotional support       PHQ-9: Recent Review Flowsheet Data    Depression screen [facility] 2/9 11/15/2016   Decreased Interest 0   Down, Depressed, Hopeless  0   PHQ - 2 Score 0     Interpretation of Total Score  Total Score Depression Severity:  1-4 = Minimal depression, 5-9 = Mild depression, 10-14 = Moderate depression, 15-19 = Moderately severe depression, 20-27 = Severe depression   Psychosocial Evaluation and Intervention:     Psychosocial Evaluation - 11/15/16 1107      Psychosocial Evaluation & Interventions   Interventions Encouraged to exercise with the program and follow exercise prescription;Stress management education;Relaxation education   Comments health related anxiety    Expected Outcomes pt will demonstrate positive outlook wtih good coping skills.    Continue Psychosocial Services  No Follow up required      Psychosocial Re-Evaluation:     Psychosocial Re-Evaluation    [place] Name 11/24/16 1713 12/22/16 1528 01/10/17 1728 02/09/17 1716 03/15/17 0902     Psychosocial Re-Evaluation   Current issues with Current Anxiety/Panic Current Anxiety/Panic Current Anxiety/Panic Current Anxiety/Panic Current Anxiety/Panic   Comments pt with health related anxiety is tolerating CR participation without difficulty.  exhibits positive interactions with staff and class mates.  pt with health related anxiety is tolerating CR participation without difficulty.  exhibits positive  interactions with staff and class mates.  pt with health related anxiety is tolerating CR participation without difficulty.  exhibits positive interactions with staff and class mates.  pt with health related anxiety is tolerating CR participation without difficulty.  exhibits positive interactions with staff and class mates.  pt with health related anxiety is tolerating CR participation without difficulty.  exhibits positive interactions with staff and class mates.    Expected Outcomes pt will exhibit positive outlook with good coping skills.  pt will exhibit positive outlook with good coping skills.  pt will exhibit positive outlook with good coping skills.  pt will exhibit positive outlook with good coping skills.  pt will exhibit positive outlook with good coping skills.    Interventions Encouraged to attend Cardiac Rehabilitation for the exercise;Stress management education;Relaxation education Encouraged to attend Cardiac Rehabilitation for the exercise;Stress management education;Relaxation education Encouraged to attend Cardiac Rehabilitation for the exercise;Stress management education;Relaxation education Encouraged to attend Cardiac Rehabilitation for the exercise;Stress management education;Relaxation education Encouraged to attend Cardiac Rehabilitation for the exercise;Stress management education;Relaxation education   Continue Psychosocial Services  Follow up required by staff Follow up required by staff Follow up required by staff Follow up required by staff Follow up required by staff      Psychosocial Discharge (Final Psychosocial Re-Evaluation):     Psychosocial Re-Evaluation - 03/15/17 0902      Psychosocial Re-Evaluation   Current issues with Current Anxiety/Panic   Comments pt with health related anxiety is tolerating CR participation without difficulty.  exhibits positive interactions with staff and class mates.    Expected Outcomes pt will exhibit positive outlook with good  coping skills.    Interventions Encouraged to attend Cardiac Rehabilitation for the exercise;Stress management education;Relaxation education   Continue Psychosocial Services  Follow up required by staff      Vocational Rehabilitation: Provide vocational rehab assistance to qualifying candidates.   Vocational Rehab Evaluation & Intervention:     Vocational Rehab - 11/11/16 1658      Initial Vocational Rehab Evaluation & Intervention   Assessment shows need for Vocational Rehabilitation No      Education: Education Goals: Education classes will be provided on a weekly basis, covering required topics. Participant will state understanding/return demonstration of topics presented.  Learning Barriers/Preferences:     Learning Barriers/Preferences -  11/11/16 0935      Learning Barriers/Preferences   Learning Barriers Sight   Learning Preferences Written Material;Skilled Demonstration      Education Topics: Count Your Pulse:  -Group instruction provided by verbal instruction, demonstration, patient participation and written materials to support subject.  Instructors address importance of being able to find your pulse and how to count your pulse when at home without a heart monitor.  Patients get hands on experience counting their pulse with staff help and individually.   Heart Attack, Angina, and Risk Factor Modification:  -Group instruction provided by verbal instruction, video, and written materials to support subject.  Instructors address signs and symptoms of angina and heart attacks.    Also discuss risk factors for heart disease and how to make changes to improve heart health risk factors.   CARDIAC REHAB PHASE II EXERCISE from 03/02/2017 in [place]  Date  02/23/17  Instruction Review Code  2- meets goals/outcomes      Functional Fitness:  -Group instruction provided by verbal instruction, demonstration, patient participation, and written  materials to support subject.  Instructors address safety measures for doing things around the house.  Discuss how to get up and down off the floor, how to pick things up properly, how to safely get out of a chair without assistance, and balance training.   Meditation and Mindfulness:  -Group instruction provided by verbal instruction, patient participation, and written materials to support subject.  Instructor addresses importance of mindfulness and meditation practice to help reduce stress and improve awareness.  Instructor also leads participants through a meditation exercise.    CARDIAC REHAB PHASE II EXERCISE from 03/02/2017 in [place]  Date  01/05/17  Instruction Review Code  2- meets goals/outcomes      Stretching for Flexibility and Mobility:  -Group instruction provided by verbal instruction, patient participation, and written materials to support subject.  Instructors lead participants through series of stretches that are designed to increase flexibility thus improving mobility.  These stretches are additional exercise for major muscle groups that are typically performed during regular warm up and cool down.   Hands Only CPR:  -Group verbal, video, and participation provides a basic overview of AHA guidelines for community CPR. Role-play of emergencies allow participants the opportunity to practice calling for help and chest compression technique with discussion of AED use.   Hypertension: -Group verbal and written instruction that provides a basic overview of hypertension including the most recent diagnostic guidelines, risk factor reduction with self-care instructions and medication management.    Nutrition I class: Heart Healthy Eating:  -Group instruction provided by PowerPoint slides, verbal discussion, and written materials to support subject matter. The instructor gives an explanation and review of the Therapeutic Lifestyle Changes diet  recommendations, which includes a discussion on lipid goals, dietary fat, sodium, fiber, plant stanol/sterol esters, sugar, and the components of a well-balanced, healthy diet.   Nutrition II class: Lifestyle Skills:  -Group instruction provided by PowerPoint slides, verbal discussion, and written materials to support subject matter. The instructor gives an explanation and review of label reading, grocery shopping for heart health, heart healthy recipe modifications, and ways to make healthier choices when eating out.   Diabetes Question & Answer:  -Group instruction provided by PowerPoint slides, verbal discussion, and written materials to support subject matter. The instructor gives an explanation and review of diabetes co-morbidities, pre- and post-prandial blood glucose goals, pre-exercise blood glucose goals, signs, symptoms,  and treatment of hypoglycemia and hyperglycemia, and foot care basics.   Diabetes Blitz:  -Group instruction provided by PowerPoint slides, verbal discussion, and written materials to support subject matter. The instructor gives an explanation and review of the physiology behind type 1 and type 2 diabetes, diabetes medications and rational behind using different medications, pre- and post-prandial blood glucose recommendations and Hemoglobin A1c goals, diabetes diet, and exercise including blood glucose guidelines for exercising safely.    Portion Distortion:  -Group instruction provided by PowerPoint slides, verbal discussion, written materials, and food models to support subject matter. The instructor gives an explanation of serving size versus portion size, changes in portions sizes over the last 20 years, and what consists of a serving from each food group.   CARDIAC REHAB PHASE II EXERCISE from 03/02/2017 in [place]  Date  01/19/17  Educator  RD  Instruction Review Code  2- meets goals/outcomes      Stress Management:  -Group  instruction provided by verbal instruction, video, and written materials to support subject matter.  Instructors review role of stress in heart disease and how to cope with stress positively.     CARDIAC REHAB PHASE II EXERCISE from 03/02/2017 in [place]  Date  01/26/17  Instruction Review Code  2- meets goals/outcomes      Exercising on Your Own:  -Group instruction provided by verbal instruction, power point, and written materials to support subject.  Instructors discuss benefits of exercise, components of exercise, frequency and intensity of exercise, and end points for exercise.  Also discuss use of nitroglycerin and activating EMS.  Review options of places to exercise outside of rehab.  Review guidelines for sex with heart disease.   CARDIAC REHAB PHASE II EXERCISE from 03/02/2017 in [place]  Date  12/15/16  Instruction Review Code  2- meets goals/outcomes      Cardiac Drugs I:  -Group instruction provided by verbal instruction and written materials to support subject.  Instructor reviews cardiac drug classes: antiplatelets, anticoagulants, beta blockers, and statins.  Instructor discusses reasons, side effects, and lifestyle considerations for each drug class.   Cardiac Drugs II:  -Group instruction provided by verbal instruction and written materials to support subject.  Instructor reviews cardiac drug classes: angiotensin converting enzyme inhibitors (ACE-I), angiotensin II receptor blockers (ARBs), nitrates, and calcium channel blockers.  Instructor discusses reasons, side effects, and lifestyle considerations for each drug class.   CARDIAC REHAB PHASE II EXERCISE from 03/02/2017 in [place]  Date  02/09/17  Educator  Kennyth Lose  Instruction Review Code  2- meets goals/outcomes      Anatomy and Physiology of the Circulatory System:  Group verbal and written instruction and models  provide basic cardiac anatomy and physiology, with the coronary electrical and arterial systems. Review of: AMI, Angina, Valve disease, Heart Failure, Peripheral Artery Disease, Cardiac Arrhythmia, Pacemakers, and the ICD.   CARDIAC REHAB PHASE II EXERCISE from 03/02/2017 in [place]  Date  03/02/17  Instruction Review Code  2- meets goals/outcomes      Other Education:  -Group or individual verbal, written, or video instructions that support the educational goals of the cardiac rehab program.   Knowledge Questionnaire Score:     Knowledge Questionnaire Score - 11/11/16 1238      Knowledge Questionnaire Score   Pre Score 18/24      Core Components/Risk Factors/Patient Goals  at Admission:     Personal Goals and Risk Factors at Admission - 11/11/16 1654      Core Components/Risk Factors/Patient Goals on Admission    Weight Management Yes   Intervention Weight Management: Develop a combined nutrition and exercise program designed to reach desired caloric intake, while maintaining appropriate intake of nutrient and fiber, sodium and fats, and appropriate energy expenditure required for the weight goal.;Weight Management: Provide education and appropriate resources to help participant work on and attain dietary goals.;Weight Management/Obesity: Establish reasonable short term and long term weight goals.;Obesity: Provide education and appropriate resources to help participant work on and attain dietary goals.   Expected Outcomes Short Term: Continue to assess and modify interventions until short term weight is achieved;Long Term: Adherence to nutrition and physical activity/exercise program aimed toward attainment of established weight goal;Weight Loss: Understanding of general recommendations for a balanced deficit meal plan, which promotes 1-2 lb weight loss per week and includes a negative energy balance of [phone] kcal/d;Weight Maintenance: Understanding of  the daily nutrition guidelines, which includes 25-35% calories from fat, 7% or less cal from saturated fats, less than 200mg  cholesterol, less than 1.5gm of sodium, & 5 or more servings of fruits and vegetables daily;Understanding recommendations for meals to include 15-35% energy as protein, 25-35% energy from fat, 35-60% energy from carbohydrates, less than 200mg  of dietary cholesterol, 20-35 gm of total fiber daily;Understanding of distribution of calorie intake throughout the day with the consumption of 4-5 meals/snacks   Hypertension Yes   Intervention Provide education on lifestyle modifcations including regular physical activity/exercise, weight management, moderate sodium restriction and increased consumption of fresh fruit, vegetables, and low fat dairy, alcohol moderation, and smoking cessation.;Monitor prescription use compliance.   Expected Outcomes Short Term: Continued assessment and intervention until BP is < 140/54mm HG in hypertensive participants. < 130/28mm HG in hypertensive participants with diabetes, heart failure or chronic kidney disease.;Long Term: Maintenance of blood pressure at goal levels.   Lipids Yes   Intervention Provide education and support for participant on nutrition & aerobic/resistive exercise along with prescribed medications to achieve LDL 70mg , HDL >40mg .   Expected Outcomes Short Term: Participant states understanding of desired cholesterol values and is compliant with medications prescribed. Participant is following exercise prescription and nutrition guidelines.;Long Term: Cholesterol controlled with medications as prescribed, with individualized exercise RX and with personalized nutrition plan. Value goals: LDL < 70mg , HDL > 40 mg.      Core Components/Risk Factors/Patient Goals Review:      Goals and Risk Factor Review    Row Name 11/24/16 1711 12/22/16 1526 01/10/17 1726 02/09/17 1716 03/15/17 0902     Core Components/Risk Factors/Patient Goals Review    Personal Goals Review Weight Management/Obesity;Lipids;Hypertension Weight Management/Obesity;Lipids;Hypertension Weight Management/Obesity;Lipids;Hypertension Weight Management/Obesity;Lipids;Hypertension Weight Management/Obesity;Lipids;Hypertension   Review pt exhibits positive outlook about CR participation.  pt exhibits positive outlook about CR participation. pt is currently exercising on her own at [facility]  pt is eager to resume water aerobics. pt is also eager to lose weight and expresses disappointment she has not lost weight yet. pt encouraged to continue nutrition and exercise guidelines to facilitate weight loss goals.  pt exhibits positive outlook about CR participation. pt is currently exercising on her own at [facility].  pt is concerend about persistent fatigue being thyroid etiology, she will review with her PCP.   pt is eager to lose weight and expresses disappointment she has not lost weight yet., although she is controlling porition sizes and content.  pt encouraged to continue nutrition and exercise guidelines to facilitate weight loss goals.  pt exhibits positive outlook about CR participation. pt is currently exercising on her own at [facility].  pt is concerend about persistent fatigue being thyroid etiology, she will review with her PCP.   pt is eager to lose weight and expresses disappointment she has not lost weight yet., although she is controlling porition sizes and content.   pt encouraged to continue nutrition and exercise guidelines to facilitate weight loss goals.  pt exhibits positive outlook about CR participation. pt is currently exercising on her own at [facility].   pt encouraged to continue nutrition and exercise guidelines to facilitate weight loss goals.    Expected Outcomes pt will participate in cardiac rehab exercise, nutrition and lifestyle modification education classes to decrease overall CAD risk factors.  pt will participate in cardiac rehab  exercise, nutrition and lifestyle modification education classes to decrease overall CAD risk factors.  pt will participate in cardiac rehab exercise, nutrition and lifestyle modification education classes to decrease overall CAD risk factors.  pt will participate in cardiac rehab exercise, nutrition and lifestyle modification education classes to decrease overall CAD risk factors.  pt will participate in cardiac rehab exercise, nutrition and lifestyle modification education classes to decrease overall CAD risk factors.       Core Components/Risk Factors/Patient Goals at Discharge (Final Review):      Goals and Risk Factor Review - 03/15/17 0902      Core Components/Risk Factors/Patient Goals Review   Personal Goals Review Weight Management/Obesity;Lipids;Hypertension   Review pt exhibits positive outlook about CR participation. pt is currently exercising on her own at [facility].   pt encouraged to continue nutrition and exercise guidelines to facilitate weight loss goals.    Expected Outcomes pt will participate in cardiac rehab exercise, nutrition and lifestyle modification education classes to decrease overall CAD risk factors.       ITP Comments:     ITP Comments    Row Name 11/11/16 0932 01/18/17 1549 02/15/17 1613 03/17/17 3159     ITP Comments Medical Director, Dr. Fransico Him Medical Director, Dr. Fransico Him Medical Director, Dr. Fransico Him Medical Director, Dr. Fransico Him       Comments: Pt is making expected progress toward personal goals after completing 29 sessions. Recommend continued exercise and life style modification education including  stress management and relaxation techniques to decrease cardiac risk profile.

## 2017-04-06 ENCOUNTER — Telehealth: Payer: Self-pay | Admitting: Cardiovascular Disease

## 2017-04-06 NOTE — Telephone Encounter (Signed)
New Message  Pt would like to stop by Monday and Pick up a list of her medications , she needs a list [place] is leaving [place]

## 2017-04-06 NOTE — Telephone Encounter (Signed)
Returned call, left message (ok per DPR).  Med list printed and placed at front desk for pick up.

## 2017-04-11 ENCOUNTER — Other Ambulatory Visit: Payer: Self-pay | Admitting: *Deleted

## 2017-04-11 MED ORDER — NITROGLYCERIN 0.4 MG SL SUBL: 0.4000 mg | SUBLINGUAL_TABLET | SUBLINGUAL | 5 refills | Status: DC | PRN

## 2017-04-15 NOTE — Addendum Note (Signed)
Encounter addended by: Lowell Guitar, RN on: 04/15/2017 11:00 AM<BR>    Actions taken: Flowsheet data copied forward, Visit Navigator Flowsheet section accepted, Sign clinical note, Episode resolved

## 2017-04-15 NOTE — Progress Notes (Signed)
Discharge Progress Report  Patient Details  Name: [name] MRN: [id number] Date of Birth: 1955/08/11 Referring Provider:     CARDIAC REHAB PHASE II ORIENTATION from 11/11/2016 in [place]  Referring Provider  Quay Burow MD       Number of Visits: 36   Reason for Discharge:  Patient has met program and personal goals.  Smoking History:  History  Smoking Status  . Never Smoker  Smokeless Tobacco  . Never Used    Diagnosis:  Stable angina ([place])  ADL UCSD:   Initial Exercise Prescription:     Initial Exercise Prescription - 11/11/16 1200      Date of Initial Exercise RX and Referring Provider   Date 11/11/16   Referring Provider Quay Burow MD     Treadmill   MPH 2   Grade 0   Minutes 10   METs 2.53     Bike   Level 0.6   Minutes 10   METs 2.13     NuStep   Level 1   Minutes 10   METs 1.8     Prescription Details   Frequency (times per week) 5   Duration Progress to 45 minutes of aerobic exercise without signs/symptoms of physical distress     Intensity   THRR 40-80% of Max Heartrate 64-128   Ratings of Perceived Exertion 11-13   Perceived Dyspnea 0-4     Progression   Progression Continue to progress workloads to maintain intensity without signs/symptoms of physical distress.     Resistance Training   Training Prescription Yes   Weight 2   Reps 10-15      Discharge Exercise Prescription (Final Exercise Prescription Changes):     Exercise Prescription Changes - 03/15/17 1200      Response to Exercise   Blood Pressure (Admit) 130/80   Blood Pressure (Exercise) 144/82   Blood Pressure (Exit) 110/70   Heart Rate (Admit) 82 bpm   Heart Rate (Exercise) 116 bpm   Heart Rate (Exit) 81 bpm   Rating of Perceived Exertion (Exercise) 12   Symptoms none   Duration Continue with 30 min of aerobic exercise without signs/symptoms of physical distress.   Intensity THRR unchanged     Progression    Progression Continue to progress workloads to maintain intensity without signs/symptoms of physical distress.   Average METs 3.6     Resistance Training   Training Prescription Yes   Weight 4lbs   Reps 10-15   Time 10 Minutes     Treadmill   MPH 3   Grade 4   Minutes 10   METs 4.95     NuStep   Level 5   Minutes 10   METs 3.5     Arm Ergometer   Level 4.4   Minutes 10     Home Exercise Plan   Plans to continue exercise at [employer] (comment)  [employer]   Frequency Add 3 additional days to program exercise sessions.   Initial Home Exercises Provided 11/29/16      Functional Capacity:     6 Minute Walk    Row Name 11/11/16 1239 03/18/17 1026       6 Minute Walk   Phase Initial Discharge    Distance 1350 feet 1967 feet    Distance % Change  - 45.7 %    Walk Time 6 minutes 6 minutes    # of Rest Breaks 0 0    MPH  2.6 3.73    METS 2.8 4.2    RPE 9 12    VO2 Peak 9.7 14.73    Symptoms No No    Resting HR 72 bpm 72 bpm    Resting BP 118/82 128/60    Max Ex. HR 95 bpm 104 bpm    Max Ex. BP 138/84 160/80    2 Minute Post BP 102/80 118/70       Psychological, QOL, Others - Outcomes: PHQ 2/9: Depression screen PHQ 2/9 11/15/2016  Decreased Interest 0  Down, Depressed, Hopeless 0  PHQ - 2 Score 0    Quality of Life:     Quality of Life - 11/29/16 0918      Quality of Life Scores   Health/Function Pre 18.96 %  pt concerned about recent cardiac event and fatigue. pt has fears and worries about living with CAD.  pt is looking forward to resuming usual activities however is uncertain she can.  pt feels her fatigue is related to her thyroid.     Socioeconomic Pre 27.83 %   Psych/Spiritual Pre 14.57 %  pt displays health related anxiety from recent lifestyle changes from cardiac event.  Fatigue has resulted in decreased ability to participate in pleasurable activities.     Family Pre 22.5 %   GLOBAL Pre 20.15 %  pt offered reassurance and emotional  support       Personal Goals: Goals established at orientation with interventions provided to work toward goal.     Personal Goals and Risk Factors at Admission - 11/11/16 1654      Core Components/Risk Factors/Patient Goals on Admission    Weight Management Yes   Intervention Weight Management: Develop a combined nutrition and exercise program designed to reach desired caloric intake, while maintaining appropriate intake of nutrient and fiber, sodium and fats, and appropriate energy expenditure required for the weight goal.;Weight Management: Provide education and appropriate resources to help participant work on and attain dietary goals.;Weight Management/Obesity: Establish reasonable short term and long term weight goals.;Obesity: Provide education and appropriate resources to help participant work on and attain dietary goals.   Expected Outcomes Short Term: Continue to assess and modify interventions until short term weight is achieved;Long Term: Adherence to nutrition and physical activity/exercise program aimed toward attainment of established weight goal;Weight Loss: Understanding of general recommendations for a balanced deficit meal plan, which promotes 1-2 lb weight loss per week and includes a negative energy balance of [phone] kcal/d;Weight Maintenance: Understanding of the daily nutrition guidelines, which includes 25-35% calories from fat, 7% or less cal from saturated fats, less than 26m cholesterol, less than 1.5gm of sodium, & 5 or more servings of fruits and vegetables daily;Understanding recommendations for meals to include 15-35% energy as protein, 25-35% energy from fat, 35-60% energy from carbohydrates, less than 2019mof dietary cholesterol, 20-35 gm of total fiber daily;Understanding of distribution of calorie intake throughout the day with the consumption of 4-5 meals/snacks   Hypertension Yes   Intervention Provide education on lifestyle modifcations including regular  physical activity/exercise, weight management, moderate sodium restriction and increased consumption of fresh fruit, vegetables, and low fat dairy, alcohol moderation, and smoking cessation.;Monitor prescription use compliance.   Expected Outcomes Short Term: Continued assessment and intervention until BP is < 140/9042mG in hypertensive participants. < 130/65m29m in hypertensive participants with diabetes, heart failure or chronic kidney disease.;Long Term: Maintenance of blood pressure at goal levels.   Lipids Yes   Intervention Provide education and support  for participant on nutrition & aerobic/resistive exercise along with prescribed medications to achieve LDL <63m, HDL >456m   Expected Outcomes Short Term: Participant states understanding of desired cholesterol values and is compliant with medications prescribed. Participant is following exercise prescription and nutrition guidelines.;Long Term: Cholesterol controlled with medications as prescribed, with individualized exercise RX and with personalized nutrition plan. Value goals: LDL < 701mHDL > 40 mg.       Personal Goals Discharge:     Goals and Risk Factor Review    Row Name 11/24/16 1711 12/22/16 1526 01/10/17 1726 02/09/17 1716 03/15/17 0902     Core Components/Risk Factors/Patient Goals Review   Personal Goals Review Weight Management/Obesity;Lipids;Hypertension Weight Management/Obesity;Lipids;Hypertension Weight Management/Obesity;Lipids;Hypertension Weight Management/Obesity;Lipids;Hypertension Weight Management/Obesity;Lipids;Hypertension   Review pt exhibits positive outlook about CR participation.  pt exhibits positive outlook about CR participation. [facility]. pt is also eager to lose weight and expresses disappointment she has not lost weight yet. pt encouraged to continue nutrition and exercise guidelines to facilitate weight loss goals.  pt  exhibits positive outlook about CR participation. [facility] persistent fatigue being thyroid etiology, she will review with her PCP.   pt is eager to lose weight and expresses disappointment she has not lost weight yet., although she is controlling porition sizes and content.   pt encouraged to continue nutrition and exercise guidelines to facilitate weight loss goals.  pt exhibits positive outlook about CR participation. [facility] about persistent fatigue being thyroid etiology, she will review with her PCP.   pt is eager to lose weight and expresses disappointment she has not lost weight yet., although she is controlling porition sizes and content.   pt encouraged to continue nutrition and exercise guidelines to facilitate weight loss goals.  pt exhibits positive outlook about CR participation. [facility] encouraged to continue nutrition and exercise guidelines to facilitate weight loss goals.    Expected Outcomes pt will participate in cardiac rehab exercise, nutrition and lifestyle modification education classes to decrease overall CAD risk factors.  pt will participate in cardiac rehab exercise, nutrition and lifestyle modification education classes to decrease overall CAD risk factors.  pt will participate in cardiac rehab exercise, nutrition and lifestyle modification education classes to decrease overall CAD risk factors.  pt will participate in cardiac rehab exercise, nutrition and lifestyle modification education classes to decrease overall CAD risk factors.  pt will participate in cardiac rehab exercise, nutrition and lifestyle modification education classes to decrease overall CAD risk factors.       Exercise Goals and Review:     Exercise Goals    Row Name 11/11/16 0936 11/23/16 1340           Exercise Goals    Increase Physical Activity (P)  Yes  Build confidence with functional acitivites and ADL's Yes      Intervention (P)  Provide advice, education, support and counseling about physical activity/exercise needs.;Develop an individualized exercise prescription for aerobic and resistive training based on initial evaluation findings, risk stratification, comorbidities and participant's personal goals. Provide advice, education, support and counseling about physical activity/exercise needs.;Develop an individualized exercise prescription for aerobic and resistive training based on initial evaluation findings, risk stratification, comorbidities and participant's  personal goals.      Expected Outcomes (P)  Achievement of increased cardiorespiratory fitness and enhanced flexibility, muscular endurance and strength shown through measurements of functional capacity and personal statement of participant. Achievement of increased cardiorespiratory fitness and enhanced flexibility, muscular endurance and strength shown through measurements of functional capacity and personal statement of participant.      Increase Strength and Stamina (P)  Yes  Be able to climb stairs and uphill without fatigue and SOB Yes      Intervention (P)  Provide advice, education, support and counseling about physical activity/exercise needs.;Develop an individualized exercise prescription for aerobic and resistive training based on initial evaluation findings, risk stratification, comorbidities and participant's personal goals. Provide advice, education, support and counseling about physical activity/exercise needs.;Develop an individualized exercise prescription for aerobic and resistive training based on initial evaluation findings, risk stratification, comorbidities and participant's personal goals.      Expected Outcomes (P)  Achievement of increased cardiorespiratory fitness and enhanced flexibility, muscular endurance and strength shown  through measurements of functional capacity and personal statement of participant. Achievement of increased cardiorespiratory fitness and enhanced flexibility, muscular endurance and strength shown through measurements of functional capacity and personal statement of participant.         Nutrition & Weight - Outcomes:     Pre Biometrics - 11/11/16 1245      Pre Biometrics   Waist Circumference 40.25 inches   Hip Circumference 52 inches   Waist to Hip Ratio 0.77 %   Triceps Skinfold 52 mm   % Body Fat 51.1 %   Grip Strength 35 kg   Flexibility 10 in   Single Leg Stand 8.03 seconds         Post Biometrics - 03/18/17 1027       Post  Biometrics   Height 5' 2"  (1.575 m)   Weight 212 lb 8.4 oz (96.4 kg)   Waist Circumference 40 inches   Hip Circumference 51 inches   Waist to Hip Ratio 0.78 %   BMI (Calculated) 39   Triceps Skinfold 50 mm   % Body Fat 49.8 %   Grip Strength 35 kg   Flexibility 10 in      Nutrition:     Nutrition Therapy & Goals - 01/26/17 1215      Nutrition Therapy   Diet Therapeutic Lifestyle Changes     Personal Nutrition Goals   Personal Goal #2 Pt to have a basic understanding of a DM diet due to A1c > 6     Intervention Plan   Intervention Prescribe, educate and counsel regarding individualized specific dietary modifications aiming towards targeted core components such as weight, hypertension, lipid management, diabetes, heart failure and other comorbidities.;Nutrition handout(s) given to patient.  1500 kcal, 5 day menu ideas; Nutrition Therapy for Type II DM   Expected Outcomes Short Term Goal: Understand basic principles of dietary content, such as calories, fat, sodium, cholesterol and nutrients.;Long Term Goal: Adherence to prescribed nutrition plan.      Nutrition Discharge:     Nutrition Assessments - 12/06/16 0953      MEDFICTS Scores   Pre Score 28      Education Questionnaire Score:     Knowledge Questionnaire Score -  11/11/16 1238      Knowledge Questionnaire Score   Pre Score 18/24      Goals reviewed with patient; copy given to patient.

## 2017-04-17 NOTE — Progress Notes (Signed)
[place] [facility] [address] [place], [place] [address] [phone] [id number]  Date:  04/18/2017   Name:  [name]   DOB:  1956/01/31   MRN:  [id number]  PCP:  Darreld Mclean, MD    Chief Complaint: Sinus Problem   History of Present Illness:  [name] is a 61 y.o. very pleasant female patient who presents with the following:  Here today with a concern of sinus infection or another cause of ear symptoms History of NSTEMI, HTN, hypothyroidism, pre-diabetes, obesity Due for hep C screening, flu shot Last seen by myself in June of this year:  Followed by cardiology (Dr. Gwenlyn Found) for CAD, HTN, Hyperlipidemia. She had an episode of SCAD/?NSTEMI in 2014. She had a cath but did not require any intervention. Since then she has been followed closely She is not able to really exercise due to her heart problems. However she has recently retired and plans to try and go back to cardiac rehab so she can be monitored while she is exercising.  She does have nitro but has never needed to use it  Notes that she feels tired and bad all the time, has for years. Also she keeps gaining weight. However she does not feel that she is depressed. She uses xanax for anxiety but does not think that depression is an issue for her She is interested in a sleep study to determine if she may have OSA. She is unsure if she does snore. However she is suspicious about OSA due to poor sleep and fatigue.  She is a never smoker She recently retired from the [facility] full labs on 02-07-2015 but also has labs from [facility] in January  She relates that she had a thyroid problem and was on ?thyroid replacement in the past.  However more recently her thyroid has been in normal range. She is frustrated because she had hoped that she could be treated for hypothyroidism and that it might help with her energy and weight issues  She has been dx  with low vitamin D; however she has not yet started taking her vitamin D supplement  Lab Results  Component Value Date   HGBA1C 5.7 01/12/2017    She notes that her bilateral ears feel very itchy, she will want to put a qtip in her ears to scratch it Also, she noted frontal sinus tenderness a few days ago- this is now really resolved Her ears are sometimes uncomfortable but not painful, no tinnitus or hearing change She has noted occasional sneezing No cough  She is planning a trip to [place] next week- [place]  She is having a hard time with her crestor- it seems to cause SE like a metallic taste in her mouth.  She is supposed to be taking 20 mg a day but is not really using this   She uses xanax up to TID (however she generally takes it just daily)- she had been getting this from Dr. Nori Riis who is retiring soon. She wonders if I can write this for her today  [place]- she filled 90 alprazolam from Dr. Nori Riis in May and August  Wt Readings from Last 3 Encounters:  04/18/17 207 lb 9.6 oz (94.2 kg)  03/18/17 212 lb 8.4 oz (96.4 kg)  02/04/17 217 lb (98.4 kg)     BP Readings from Last 3 Encounters:  04/18/17 109/75  02/04/17 128/76  01/12/17 109/76  Lab Results  Component Value Date   HGBA1C 5.7 01/12/2017    Patient Active Problem List   Diagnosis Date Noted  . Pre-diabetes 01/14/2017  . 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 Tachycardia, somewhat irregular, episodic 10/08/2013  . Dizziness 10/08/2013  . Angina effort ([place]) 05/22/2013  . Unstable angina ([place]) 01/22/2013  . Hyperlipidemia 01/01/2013  . CAD (coronary artery disease), possible SCAD 12/14/2012  . NSTEMI (non-ST elevated myocardial infarction) ([place]) 12/10/2012  . Essential hypertension 12/10/2012  . Hypothyroidism 12/10/2012  . Obesity (BMI 35.0-39.9 without comorbidity) 12/10/2012  . Gastroenteritis 10/08/2011  . Acute ischemic colitis ([place]) 08/09/2011  . DYSPHAGIA UNSPECIFIED 07/01/2010  .  RECTAL BLEEDING Jan 2013 11/04/2009  . GERD 02/11/2009  . Irritable bowel syndrome 02/11/2009  . PERSONAL HX COLONIC POLYPS 02/11/2009    Past Medical History:  Diagnosis Date  . Angina effort ([place]) 05/22/2013  . Anxiety   . CAD (coronary artery disease), possible SCAD 12/14/2012  . Colon polyps   . Duodenitis   . Dyspnea on exertion    LEXISCAN, 09/30/2008 - normal, EKG negative for ischemia, no ECG changes  . Esophageal stricture   . Gastritis   . GERD (gastroesophageal reflux disease)   . Gout    "only from RX given after MI" (01/22/2013)  . H/O hiatal hernia   . History of esophageal stricture   . Hypertension    2D ECHO, 09/30/2008 - EF >55%, normal: RENAL DOPPLER, 03/21/2007 - normal duplex study  . Hypothyroidism   . IBS (irritable bowel syndrome)   . Ischemic colitis ([place])   . Lower GI bleeding    10/07/11 "I've had 3 episodes in the past year"  . Myocardial infarction [facility]) 12/10/2012   "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)  . Tubular adenoma polyp of rectum 2008    Past Surgical History:  Procedure Laterality Date  . ABDOMINAL HYSTERECTOMY  1999  . ANTERIOR LUMBAR FUSION  2010   "L4-5" (01/22/2013)  . BACK SURGERY  09/2008  . CARDIAC CATHETERIZATION N/A 02/06/2015   Procedure: Left Heart Cath and Coronary Angiography;  Surgeon: Leonie Man, MD;  Location: [facility] LAB;  Service: Cardiovascular;  Laterality: N/A;  . ESOPHAGEAL DILATION  08/2010  . ESOPHAGEAL DILATION     "once or twice" (01/22/2013)  . HAMMER TOE SURGERY Bilateral ~ 2002  . LEFT HEART CATHETERIZATION WITH CORONARY ANGIOGRAM N/A 12/11/2012   Procedure: LEFT HEART CATHETERIZATION WITH CORONARY ANGIOGRAM;  Surgeon: Leonie Man, MD;  Location: [facility] CATH LAB;  Service: Cardiovascular;  Laterality: N/A;  . LEFT HEART CATHETERIZATION WITH CORONARY ANGIOGRAM N/A 12/15/2012   Procedure: LEFT HEART CATHETERIZATION WITH CORONARY ANGIOGRAM;  Surgeon: Leonie Man, MD;  Location: [facility] CATH LAB;   Service: Cardiovascular;  Laterality: N/A;  . PERIPHERAL VENOUS STUDY  10/03/2008   No evidence of DVT, superficial thrombosis, or Baker's cyst  . TONSILLECTOMY AND ADENOIDECTOMY  1960's  . TUBAL LIGATION  1985    Social History  Substance Use Topics  . Smoking status: Never Smoker  . Smokeless tobacco: Never Used  . Alcohol use No    Family History  Problem Relation Age of Onset  . Colon cancer Brother   . Kidney disease Mother   . Stroke Father   . Heart attack Brother     No Known Allergies  Medication list has been reviewed and updated.  Current Outpatient Prescriptions on File Prior to Visit  Medication Sig Dispense Refill  . ALPRAZolam (XANAX) 0.5 MG  tablet Take 0.5 mg by mouth daily.     Marland Kitchen amLODipine (NORVASC) 5 MG tablet Take 1 tablet (5 mg total) by mouth daily. 30 tablet 11  . aspirin EC 81 MG EC tablet Take 1 tablet (81 mg total) by mouth daily.    . clopidogrel (PLAVIX) 75 MG tablet TAKE 1 TABLET BY MOUTH ONCE A DAY WITH BREAKFAST 30 tablet 10  . estradiol (ESTRACE) 1 MG tablet Take 1 mg by mouth daily.    . isosorbide mononitrate (IMDUR) 60 MG 24 hr tablet TAKE 1 TABLET BY MOUTH ONCE A DAY 30 tablet 11  . lisinopril (PRINIVIL,ZESTRIL) 5 MG tablet TAKE 1 TABLET BY MOUTH DAILY. 90 tablet 2  . metoprolol tartrate (LOPRESSOR) 25 MG tablet TAKE 1 TABLET BY MOUTH TWICE A DAY 60 tablet 11  . nitroGLYCERIN (NITROSTAT) 0.4 MG SL tablet Place 1 tablet (0.4 mg total) under the tongue every 5 (five) minutes as needed for chest pain. 25 tablet 5  . pantoprazole (PROTONIX) 40 MG tablet TAKE 1 TABLET BY MOUTH TWICE A DAY 60 tablet 10  . RANEXA 1000 MG SR tablet TAKE 1 TABLET BY MOUTH 2 TIMES DAILY. 180 tablet 2  . rosuvastatin (CRESTOR) 20 MG tablet Take 1 tablet (20 mg total) by mouth at bedtime. 90 tablet 3  . [DISCONTINUED] phentermine 15 MG capsule Take 15 mg by mouth every morning.       No current facility-administered medications on file prior to visit.     Review of  Systems:  As per HPI- otherwise negative. No CP or SOB No nausea, vomiting, diarrhea    Physical Examination: Vitals:   04/18/17 1345  BP: 109/75  Pulse: 69  Temp: 98.2 F (36.8 C)  SpO2: 96%   Vitals:   04/18/17 1345  Weight: 207 lb 9.6 oz (94.2 kg)  Height: 5\' 3"  (1.6 m)   Body mass index is 36.77 kg/m. Ideal Body Weight: Weight in (lb) to have BMI = 25: 140.8  GEN: WDWN, NAD, Non-toxic, A & O x 3, obese, otherwise looks well HEENT: Atraumatic, Normocephalic. Neck supple. No masses, No LAD.  Bilateral TM wnl, oropharynx normal.  PEERL,EOMI.   Ears and Nose: No external deformity. CV: RRR, No M/G/R. No JVD. No thrill. No extra heart sounds. PULM: CTA B, no wheezes, crackles, rhonchi. No retractions. No resp. distress. No accessory muscle use. ABD: S, NT, ND, +BS. No rebound. No HSM. EXTR: No c/c/e NEURO Normal gait.  PSYCH: Normally interactive. Conversant. Not depressed or anxious appearing.  Calm demeanor.    Assessment and Plan: Ear itching  GAD (generalized anxiety disorder) - Plan: ALPRAZolam (XANAX) 0.5 MG tablet  Weight loss  Here today with concern of ears itching.  Likley due to allergies Gave her samples of nasacort and Xyzal to use prn.  If not helpful in a few days I will give her a short course of prednisone prior to her trip to [place] Refilled her xanax today- she generally takes this once a day She is disappointed about her weight loss, but I reminded her that losing 10 lbs in 2 months is actually great progress Plan to visit in 3-4 months for a recheck and fasting labs   Signed Lamar Blinks, MD

## 2017-04-18 ENCOUNTER — Ambulatory Visit (INDEPENDENT_AMBULATORY_CARE_PROVIDER_SITE_OTHER): Payer: Federal, State, Local not specified - PPO | Admitting: Family Medicine

## 2017-04-18 VITALS — BP 109/75 | HR 69 | Temp 98.2°F | Ht 63.0 in | Wt 207.6 lb

## 2017-04-18 DIAGNOSIS — F411 Generalized anxiety disorder: Secondary | ICD-10-CM | POA: Diagnosis not present

## 2017-04-18 DIAGNOSIS — R634 Abnormal weight loss: Secondary | ICD-10-CM

## 2017-04-18 DIAGNOSIS — L299 Pruritus, unspecified: Secondary | ICD-10-CM | POA: Diagnosis not present

## 2017-04-18 MED ORDER — ALPRAZOLAM 0.5 MG PO TABS: 0.5000 mg | ORAL_TABLET | Freq: Three times a day (TID) | ORAL | 1 refills | Status: DC | PRN

## 2017-04-18 NOTE — Patient Instructions (Addendum)
I think you are suffering from allergies Please try the Nasacort nasal spray, and the Xyzal once a day- let me know if this is not helpful for you  Please try taking 1/2 your crestor, or even taking it every other day  Continue your gradual weight loss- you have done a great job so far!  Remember that slow weight loss is the best way to keep it off  Please see me for a recheck and fasting labs in 3-4 months

## 2017-07-04 ENCOUNTER — Other Ambulatory Visit: Payer: Self-pay | Admitting: Cardiovascular Disease

## 2017-07-05 ENCOUNTER — Other Ambulatory Visit: Payer: Self-pay | Admitting: Cardiovascular Disease

## 2017-07-18 ENCOUNTER — Other Ambulatory Visit: Payer: Self-pay | Admitting: Cardiovascular Disease

## 2017-07-18 NOTE — Telephone Encounter (Signed)
REFILL 

## 2017-08-03 ENCOUNTER — Other Ambulatory Visit: Payer: Self-pay | Admitting: Cardiovascular Disease

## 2017-08-05 ENCOUNTER — Ambulatory Visit: Payer: Federal, State, Local not specified - PPO | Admitting: Cardiovascular Disease

## 2017-08-10 ENCOUNTER — Ambulatory Visit: Payer: Federal, State, Local not specified - PPO | Admitting: Cardiovascular Disease

## 2017-08-10 ENCOUNTER — Encounter: Payer: Self-pay | Admitting: Cardiovascular Disease

## 2017-08-10 VITALS — BP 114/70 | HR 63 | Ht 63.0 in | Wt 208.0 lb

## 2017-08-10 DIAGNOSIS — E7800 Pure hypercholesterolemia, unspecified: Secondary | ICD-10-CM | POA: Diagnosis not present

## 2017-08-10 DIAGNOSIS — I10 Essential (primary) hypertension: Secondary | ICD-10-CM

## 2017-08-10 DIAGNOSIS — I2510 Atherosclerotic heart disease of native coronary artery without angina pectoris: Secondary | ICD-10-CM | POA: Diagnosis not present

## 2017-08-10 NOTE — Addendum Note (Signed)
Addended by: Therisa Doyne on: 08/10/2017 12:23 PM   Modules accepted: Orders

## 2017-08-10 NOTE — Assessment & Plan Note (Signed)
History of hyperlipidemia intolerant to statin therapy with recent lipid profile performed 01/12/17 revealing a total cholesterol 239 and a triglyceride level of 247. We will explore PC SK9 monoclonal

## 2017-08-10 NOTE — Assessment & Plan Note (Signed)
History of CAD status post catheterization by Dr. Ellyn Hack 5/11 revealing diffuse LAD with essentially subtotal occlusion possibly related to spontaneous coronary dissection. She's had multiple cardiac catheterizations in the past most recently by Dr. Ellyn Hack showed stable disease. She was on Ranexa which she no longer is on but denies chest pain.

## 2017-08-10 NOTE — Progress Notes (Signed)
08/10/2017 [name]   1956/04/21  [id number]  Primary Physician Copland, Gay Filler, MD Primary Cardiologist: Lorretta Harp MD Garret Reddish, [place], [place]  HPI:  [name] is a 62 y.o.  who I last saw December of last year with a history of HTN, obesity, and hypothyroidism . I last saw her in the office  06/08/16 .  She presented on 12/11/12 after a prolonged episode of angina. Her EKG was without acute changes, but she ruled in for NSTEMI. Her troponin peaked at 4.95. She was placed on IV Heparin and underwent diagnostic coronary angiography. Her initial cardiac catheterization on 5/11, performed by Dr. Ellyn Hack, demonstrated diffuse LAD disease with essentially subtotal occlusion that had the appearance of possible Spontaneous Coronary Artery Dissection. However, with existing CAD, simply diffuse disease was also possible. It was decided to not do any intervention at that time, but to wait and have the patient return, several days later, for a re-look cath to re-evaluate the LAD. She was maintained on medical therapy and had improvement in symtpoms. She returned to the cath lab on 5/16 for re-look. This was also performed by Dr. Ellyn Hack. She was noted to have progression of LAD disease to total occlusion at the original ~95% subtotal location with improved D1-distal LAD and R-L collaterals (septal and distal RPDA). The images were reviewd by Dr. Gwenlyn Found and Dr. Lia Foyer and it was concluded that the best course of action was not to proceed with extensive LAD PTCA-PCI, in the absence of on-going symptoms. Medical therapy was recommended. Since I saw her a year ago she's been doing well until this past July when she was admitted with unstable angina. She ruled out for myocardial infarction. She underwent cardiac catheterization by Dr. Ellyn Hack revealing an occluded LAD in the midportion and otherwise minimal CAD. She did have a 90% small ostial stent second marginal branch stenosis and 60%  PLA stenosis with what appeared to be fairly well preserved LV function. She had an apical wall motion abnormality. Since I saw her a year ago she saw remained currently stable specifically denying chest pain or shortness of breath. Her major complaint is of chronic fatigue and lack of energy. Since I saw her year ago she's remained stable. She is asymptomatic. She retired from working as a [employer] on 08/01/17 and seems much happier.    Current Meds  Medication Sig  . ALPRAZolam (XANAX) 0.5 MG tablet Take 1 tablet (0.5 mg total) by mouth 3 (three) times daily as needed for anxiety.  Marland Kitchen amLODipine (NORVASC) 5 MG tablet TAKE 1 TABLET (5 MG TOTAL) BY MOUTH DAILY.  Marland Kitchen aspirin EC 81 MG EC tablet Take 1 tablet (81 mg total) by mouth daily.  . clopidogrel (PLAVIX) 75 MG tablet TAKE 1 TABLET BY MOUTH ONCE A DAY WITH BREAKFAST  . estradiol (ESTRACE) 1 MG tablet Take 1 mg by mouth daily.  . isosorbide mononitrate (IMDUR) 60 MG 24 hr tablet TAKE 1 TABLET BY MOUTH ONCE A DAY  . lisinopril (PRINIVIL,ZESTRIL) 5 MG tablet TAKE 1 TABLET BY MOUTH DAILY.  . metoprolol tartrate (LOPRESSOR) 25 MG tablet TAKE 1 TABLET BY MOUTH TWICE A DAY  . nitroGLYCERIN (NITROSTAT) 0.4 MG SL tablet Place 1 tablet (0.4 mg total) under the tongue every 5 (five) minutes as needed for chest pain.  . pantoprazole (PROTONIX) 40 MG tablet Take 1 tablet (40 mg total) by mouth 2 (two) times daily. KEEP OV.  Marland Kitchen RANEXA 1000 MG SR tablet  TAKE 1 TABLET BY MOUTH 2 TIMES DAILY.     No Known Allergies  Social History   Socioeconomic History  . Marital status: Single    Spouse name: Not on file  . Number of children: 2  . Years of education: Not on file  . Highest education level: Not on file  Social Needs  . Financial resource strain: Not on file  . Food insecurity - worry: Not on file  . Food insecurity - inability: Not on file  . Transportation needs - medical: Not on file  . Transportation needs - non-medical: Not on file  Occupational  History  . Occupation: Drug enforcement    CommentPassenger transport manager  Tobacco Use  . Smoking status: Never Smoker  . Smokeless tobacco: Never Used  Substance and Sexual Activity  . Alcohol use: No  . Drug use: No  . Sexual activity: No  Other Topics Concern  . Not on file  Social History Narrative   Daily caffeine use.     Review of Systems: General: negative for chills, fever, night sweats or weight changes.  Cardiovascular: negative for chest pain, dyspnea on exertion, edema, orthopnea, palpitations, paroxysmal nocturnal dyspnea or shortness of breath Dermatological: negative for rash Respiratory: negative for cough or wheezing Urologic: negative for hematuria Abdominal: negative for nausea, vomiting, diarrhea, bright red blood per rectum, melena, or hematemesis Neurologic: negative for visual changes, syncope, or dizziness All other systems reviewed and are otherwise negative except as noted above.    Blood pressure 114/70, pulse 63, height 5\' 3"  (1.6 m), weight 208 lb (94.3 kg).  General appearance: alert and no distress Neck: no adenopathy, no carotid bruit, no JVD, supple, symmetrical, trachea midline and thyroid not enlarged, symmetric, no tenderness/mass/nodules Lungs: clear to auscultation bilaterally Heart: regular rate and rhythm, S1, S2 normal, no murmur, click, rub or gallop Extremities: extremities normal, atraumatic, no cyanosis or edema Pulses: 2+ and symmetric Skin: Skin color, texture, turgor normal. No rashes or lesions Neurologic: Alert and oriented X 3, normal strength and tone. Normal symmetric reflexes. Normal coordination and gait  EKG sinus rhythm at 63 without ST or T-wave changes. I personally reviewed this EKG  ASSESSMENT AND PLAN:   Essential hypertension History of essential hypertension blood pressure measured to 114/70. She is on amlodipine, metoprolol and lisinopril. Continue current meds for current dosing  Hyperlipidemia History of  hyperlipidemia intolerant to statin therapy with recent lipid profile performed 01/12/17 revealing a total cholesterol 239 and a triglyceride level of 247. We will explore PC SK9 monoclonal  CAD (coronary artery disease), possible SCAD History of CAD status post catheterization by Dr. Ellyn Hack 5/11 revealing diffuse LAD with essentially subtotal occlusion possibly related to spontaneous coronary dissection. She's had multiple cardiac catheterizations in the past most recently by Dr. Ellyn Hack showed stable disease. She was on Ranexa which she no longer is on but denies chest pain.      Lorretta Harp MD FACP,FACC,FAHA, [facility] 08/10/2017 11:49 AM

## 2017-08-10 NOTE — Assessment & Plan Note (Signed)
History of essential hypertension blood pressure measured to 114/70. She is on amlodipine, metoprolol and lisinopril. Continue current meds for current dosing

## 2017-08-10 NOTE — Patient Instructions (Addendum)
Medication Instructions: Your physician recommends that you continue on your current medications as directed. Please refer to the Current Medication list given to you today.  Talk to Erasmo Downer, PharmD today about starting PCSK9 therapy.  Labwork: Your physician recommends that you return for a FASTING lipid profile and hepatic function panel this week.  Follow-Up: Your physician wants you to follow-up in: 1 year with Dr. Gwenlyn Found. You will receive a reminder letter in the mail two months in advance. If you don't receive a letter, please call our office to schedule the follow-up appointment.  If you need a refill on your cardiac medications before your next appointment, please call your pharmacy.

## 2017-09-19 DIAGNOSIS — L2089 Other atopic dermatitis: Secondary | ICD-10-CM | POA: Diagnosis not present

## 2017-09-26 ENCOUNTER — Other Ambulatory Visit: Payer: Federal, State, Local not specified - PPO

## 2017-09-26 DIAGNOSIS — E7800 Pure hypercholesterolemia, unspecified: Secondary | ICD-10-CM | POA: Diagnosis not present

## 2017-09-27 DIAGNOSIS — K080 Exfoliation of teeth due to systemic causes: Secondary | ICD-10-CM | POA: Diagnosis not present

## 2017-09-27 LAB — HEPATIC FUNCTION PANEL
ALBUMIN: 3.9 g/dL (ref 3.6–4.8)
ALT: 11 IU/L (ref 0–32)
AST: 15 IU/L (ref 0–40)
Alkaline Phosphatase: 84 IU/L (ref 39–117)
BILIRUBIN TOTAL: 0.4 mg/dL (ref 0.0–1.2)
Bilirubin, Direct: 0.1 mg/dL (ref 0.00–0.40)
Total Protein: 6.9 g/dL (ref 6.0–8.5)

## 2017-09-27 LAB — LIPID PANEL
CHOL/HDL RATIO: 3.9 ratio (ref 0.0–4.4)
Cholesterol, Total: 235 mg/dL — ABNORMAL HIGH (ref 100–199)
HDL: 61 mg/dL (ref >39)
LDL CALC: 135 mg/dL — AB (ref 0–99)
TRIGLYCERIDES: 196 mg/dL — AB (ref 0–149)
VLDL CHOLESTEROL CAL: 39 mg/dL (ref 5–40)

## 2017-10-10 ENCOUNTER — Telehealth: Payer: Self-pay | Admitting: Cardiovascular Disease

## 2017-10-10 NOTE — Telephone Encounter (Signed)
Returned call to pt informed pt to have dental office fax over written request for cardiac and medication clearance for a dental procedure

## 2017-10-10 NOTE — Telephone Encounter (Signed)
[name] is calling because she has a question about her Plavix . Please Call

## 2017-10-11 NOTE — Progress Notes (Signed)
[place] at [facility] [address], [place], [place] [address] [phone]  Date:  10/13/2017   Name:  [name]   DOB:  1955-10-10   MRN:  [id number]  PCP:  Darreld Mclean, MD    Chief Complaint: Cough (c/o cough x 1 week. pt reports using otc delsym which has helped some. )   History of Present Illness:  [name] is a 62 y.o. very pleasant female patient who presents with the following:  Concern of cough today History of pre-diabetes, CAD, NSTEMI, hypothyroidism  She has had a persistent cough for a bit over a week, some laryngitis at first which is now resolved Her lower throat feels raw She is starting to cough up some mucus, but feels like there is more stuck in her throat No fever or chills She did have an earache at first, thought it might be "sinuses or allergies" She does not feel bad- no flu like sx. Overall she feels like she is getting better.  "I almost didn't keep this appointment"  She has used some OTC delsym No GI symptoms No sick contacts   She is using xanax once a day for anxiety She is having a hard time sleeping due to cough   She also mentions that her chest has felt heavy during this illness.  This is also getting better.  She says this is not like the angina she had in the past- she had a NSTEMI in 2014.  She is willing to get an EKG today, but states that she feels sure "this is not my heart" and she regrets admitting this sx to me.   Patient Active Problem List   Diagnosis Date Noted  . Pre-diabetes 01/14/2017  . 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 Tachycardia, somewhat irregular, episodic 10/08/2013  . Dizziness 10/08/2013  . Angina effort ([place]) 05/22/2013  . Unstable angina ([place]) 01/22/2013  . Hyperlipidemia 01/01/2013  . CAD (coronary artery disease), possible SCAD 12/14/2012  . NSTEMI (non-ST elevated myocardial infarction) ([place]) 12/10/2012  . Essential  hypertension 12/10/2012  . Hypothyroidism 12/10/2012  . Obesity (BMI 35.0-39.9 without comorbidity) 12/10/2012  . Gastroenteritis 10/08/2011  . Acute ischemic colitis ([place]) 08/09/2011  . DYSPHAGIA UNSPECIFIED 07/01/2010  . RECTAL BLEEDING Jan 2013 11/04/2009  . GERD 02/11/2009  . Irritable bowel syndrome 02/11/2009  . PERSONAL HX COLONIC POLYPS 02/11/2009    Past Medical History:  Diagnosis Date  . Angina effort ([place]) 05/22/2013  . Anxiety   . CAD (coronary artery disease), possible SCAD 12/14/2012  . Colon polyps   . Duodenitis   . Dyspnea on exertion    LEXISCAN, 09/30/2008 - normal, EKG negative for ischemia, no ECG changes  . Esophageal stricture   . Gastritis   . GERD (gastroesophageal reflux disease)   . Gout    "only from RX given after MI" (01/22/2013)  . H/O hiatal hernia   . History of esophageal stricture   . Hypertension    2D ECHO, 09/30/2008 - EF >55%, normal: RENAL DOPPLER, 03/21/2007 - normal duplex study  . Hypothyroidism   . IBS (irritable bowel syndrome)   . Ischemic colitis ([place])   . Lower GI bleeding    10/07/11 "I've had 3 episodes in the past year"  . Myocardial infarction [facility]) 12/10/2012   "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)  . Tubular adenoma polyp of rectum 2008    Past Surgical History:  Procedure Laterality  Date  . ABDOMINAL HYSTERECTOMY  1999  . ANTERIOR LUMBAR FUSION  2010   "L4-5" (01/22/2013)  . BACK SURGERY  09/2008  . CARDIAC CATHETERIZATION N/A 02/06/2015   Procedure: Left Heart Cath and Coronary Angiography;  Surgeon: Leonie Man, MD;  Location: [place] CV LAB;  Service: Cardiovascular;  Laterality: N/A;  . ESOPHAGEAL DILATION  08/2010  . ESOPHAGEAL DILATION     "once or twice" (01/22/2013)  . HAMMER TOE SURGERY Bilateral ~ 2002  . LEFT HEART CATHETERIZATION WITH CORONARY ANGIOGRAM N/A 12/11/2012   Procedure: LEFT HEART CATHETERIZATION WITH CORONARY ANGIOGRAM;  Surgeon: Leonie Man, MD;  Location[facility] C[facility] LAB;   Service: Cardiovascular;  Laterality: N/A;  . LEFT HEART CATHETERIZATION WITH CORONARY ANGIOGRAM N/A 12/15/2012   Procedure: LEFT HEART CATHETERIZATION WITH CORONARY ANGIOGRAM;  Surgeon: Leonie Man, MD;  Location: [facility] CATH LAB;  Service: Cardiovascular;  Laterality: N/A;  . PERIPHERAL VENOUS STUDY  10/03/2008   No evidence of DVT, superficial thrombosis, or Baker's cyst  . TONSILLECTOMY AND ADENOIDECTOMY  1960's  . TUBAL LIGATION  1985    Social History   Tobacco Use  . Smoking status: Never Smoker  . Smokeless tobacco: Never Used  Substance Use Topics  . Alcohol use: No  . Drug use: No    Family History  Problem Relation Age of Onset  . Colon cancer Brother   . Kidney disease Mother   . Stroke Father   . Heart attack Brother     No Known Allergies  Medication list has been reviewed and updated.  Current Outpatient Medications on File Prior to Visit  Medication Sig Dispense Refill  . ALPRAZolam (XANAX) 0.5 MG tablet Take 1 tablet (0.5 mg total) by mouth 3 (three) times daily as needed for anxiety. 90 tablet 1  . amLODipine (NORVASC) 5 MG tablet TAKE 1 TABLET (5 MG TOTAL) BY MOUTH DAILY. 30 tablet 10  . aspirin EC 81 MG EC tablet Take 1 tablet (81 mg total) by mouth daily.    . clopidogrel (PLAVIX) 75 MG tablet TAKE 1 TABLET BY MOUTH ONCE A DAY WITH BREAKFAST 30 tablet 9  . estradiol (ESTRACE) 1 MG tablet Take 1 mg by mouth daily.    . isosorbide mononitrate (IMDUR) 60 MG 24 hr tablet TAKE 1 TABLET BY MOUTH ONCE A DAY 30 tablet 10  . lisinopril (PRINIVIL,ZESTRIL) 5 MG tablet TAKE 1 TABLET BY MOUTH DAILY. 90 tablet 2  . metoprolol tartrate (LOPRESSOR) 25 MG tablet TAKE 1 TABLET BY MOUTH TWICE A DAY 60 tablet 10  . nitroGLYCERIN (NITROSTAT) 0.4 MG SL tablet Place 1 tablet (0.4 mg total) under the tongue every 5 (five) minutes as needed for chest pain. 25 tablet 5  . pantoprazole (PROTONIX) 40 MG tablet Take 1 tablet (40 mg total) by mouth 2 (two) times daily. KEEP OV. 60  tablet 2  . RANEXA 1000 MG SR tablet TAKE 1 TABLET BY MOUTH 2 TIMES DAILY. 180 tablet 2  . [DISCONTINUED] phentermine 15 MG capsule Take 15 mg by mouth every morning.       No current facility-administered medications on file prior to visit.     Review of Systems:  As per HPI- otherwise negative.   Physical Examination: Vitals:   10/13/17 1444  BP: 116/82  Pulse: 65  Temp: 98 F (36.7 C)  SpO2: 98%   Vitals:   10/13/17 1444  Weight: 212 lb (96.2 kg)  Height: 5\' 3"  (1.6 m)   Body mass  index is 37.55 kg/m. Ideal Body Weight: Weight in (lb) to have BMI = 25: 140.8  GEN: WDWN, NAD, Non-toxic, A & O x 3, obese, looks well HEENT: Atraumatic, Normocephalic. Neck supple. No masses, No LAD.  Bilateral TM wnl, oropharynx normal.  PEERL,EOMI.   Ears and Nose: No external deformity. CV: RRR, No M/G/R. No JVD. No thrill. No extra heart sounds. PULM: CTA B, no wheezes, crackles, rhonchi. No retractions. No resp. distress. No accessory muscle use. ABD: S, NT, ND, +BS. No rebound. No HSM. EXTR: No c/c/e NEURO Normal gait.  PSYCH: Normally interactive. Conversant. Not depressed or anxious appearing.  Calm demeanor.   EKG: NSR, no concerning findings Compared with past tracing from January no change Assessment and Plan: Cough - Plan: benzonatate (TESSALON) 100 MG capsule  Chest tightness - Plan: EKG 12-Lead   Here today with cough for a week or a bit more.  Now getting better.  Her main concern is her cough which is annoying.  rx for tessalon perles.  She is having a hard time sleeping due to cough.  Declines hycodan.  She does take xanax once a day- it is written for TID.  Advised that she can take a 1/2 dose at bedtime to help her sleep during this illness if she likes  She denies any CP, and EKG is reassuring today.  She will report any chest discomfort similar to when she had NSTEMi in th past   Signed Lamar Blinks, MD

## 2017-10-13 ENCOUNTER — Telehealth: Payer: Self-pay | Admitting: Cardiovascular Disease

## 2017-10-13 ENCOUNTER — Encounter: Payer: Self-pay | Admitting: Family Medicine

## 2017-10-13 ENCOUNTER — Ambulatory Visit: Payer: Federal, State, Local not specified - PPO | Admitting: Family Medicine

## 2017-10-13 VITALS — BP 116/82 | HR 65 | Temp 98.0°F | Ht 63.0 in | Wt 212.0 lb

## 2017-10-13 DIAGNOSIS — R0789 Other chest pain: Secondary | ICD-10-CM | POA: Diagnosis not present

## 2017-10-13 DIAGNOSIS — R05 Cough: Secondary | ICD-10-CM

## 2017-10-13 DIAGNOSIS — R059 Cough, unspecified: Secondary | ICD-10-CM

## 2017-10-13 MED ORDER — BENZONATATE 100 MG PO CAPS: 100.0000 mg | ORAL_CAPSULE | Freq: Three times a day (TID) | ORAL | 0 refills | Status: DC | PRN

## 2017-10-13 NOTE — Telephone Encounter (Signed)
Returned call to patient to notify her that faxed clearance request from her dentist has not yet been received - not in [place], not in triage. Provided her with fax # [phone] so that dentist can send request.

## 2017-10-13 NOTE — Telephone Encounter (Signed)
Follow UP:     Pt just called and said her dentist office faxed over her clearance yesterday.She says she need to know about stopping her Plavix asap please.,

## 2017-10-13 NOTE — Patient Instructions (Signed)
Good to see you today- I have rx tessalon perles for you to use as needed for cough  Let me know if you do not continue to improve over the next several days If you are getting worse or have any change in your symptoms please let me know or otherwise seek care right away

## 2017-10-20 ENCOUNTER — Telehealth: Payer: Self-pay | Admitting: Cardiovascular Disease

## 2017-10-20 ENCOUNTER — Telehealth: Payer: Self-pay | Admitting: Pharmacist Clinician (PhC)/ Clinical Pharmacy Specialist

## 2017-10-20 NOTE — Telephone Encounter (Signed)
Follow Up:     Please call,concerning clearance for her dental work.

## 2017-10-20 NOTE — Telephone Encounter (Signed)
Per Dr. Gwenlyn Found, pt can hold her anti-platelet meds for 1 week prior to dental procedures if necessary.

## 2017-10-20 NOTE — Telephone Encounter (Signed)
Advised patient, verbalized understanding  

## 2017-10-20 NOTE — Telephone Encounter (Signed)
Spoke with patient regarding an ok to hold ASA and Plavix prior to dental work. Per patient her dentist does NOT need clearance for dental work she was just told to call and see how long she needs to hold medications. She has had to cancel her dental appointment and has been trying to get answer since 10/10/17. She is not sure what will need to be done as far as dental procedure she just wants to be sure she is ready when she goes for her appointment as she is having a lot of tooth pain. She is very frustrated and her dentist says they have faxed office twice with this information. Will forward to Dr Gwenlyn Found for review

## 2017-10-20 NOTE — Telephone Encounter (Signed)
Spoke with patient when she saw Dr. Gwenlyn Found in January.    Her statin history includes atorvastatin 40 mg (May 2014-June 2015)           Pravastatin 40 mg October 2016- Nov 2017)           Rosuvastatin 20 mg July 2018  She stopped these due to problems with myalgias as well as insomnia and memory issues.    Her family history is significant in that her father died from a stroke at the age of 13 and her brother from an MI at the age of 55

## 2017-11-02 ENCOUNTER — Telehealth: Payer: Self-pay | Admitting: Pharmacist Clinician (PhC)/ Clinical Pharmacy Specialist

## 2017-11-02 MED ORDER — EVOLOCUMAB 140 MG/ML ~~LOC~~ SOAJ: 140.0000 mg | SUBCUTANEOUS | 12 refills | Status: DC

## 2017-11-02 NOTE — Telephone Encounter (Signed)
Sent rx to [facility], patient should be able to get for $5 copay.  LM for patient if she needs card.

## 2017-11-16 ENCOUNTER — Other Ambulatory Visit: Payer: Self-pay | Admitting: Family Medicine

## 2017-11-16 DIAGNOSIS — F411 Generalized anxiety disorder: Secondary | ICD-10-CM

## 2017-11-17 NOTE — Telephone Encounter (Signed)
Received refill request for ALPRAZOLAM (XANAX) 0.5 MG tablet. Last office visit 10/13/2017 and last refill 04/18/17.

## 2017-11-18 NOTE — Telephone Encounter (Signed)
[facility]: 08/31/2017  1  04/18/2017  Alprazolam 0.5 Mg Tablet  90 30 Je Cop  [id number]  Cos ([id number])  2 3.00 LME Other  [place]  05/31/2017  1  04/18/2017  Alprazolam 0.5 Mg Tablet  90 30 Je Cop  [id number]  Cos ([id number])  1 3.00 LME Other  [place]  03/04/2017  1  12/21/2016  Alprazolam 0.5 Mg Tablet  90 30 Wi [place]  [id number]  Cos ([id number])  1

## 2017-12-02 ENCOUNTER — Other Ambulatory Visit: Payer: Self-pay | Admitting: Cardiovascular Disease

## 2017-12-02 ENCOUNTER — Ambulatory Visit: Payer: Federal, State, Local not specified - PPO | Admitting: Internal Medicine

## 2017-12-02 NOTE — Telephone Encounter (Signed)
Rx sent to pharmacy   

## 2017-12-07 ENCOUNTER — Encounter (INDEPENDENT_AMBULATORY_CARE_PROVIDER_SITE_OTHER): Payer: Self-pay

## 2017-12-07 ENCOUNTER — Telehealth: Payer: Self-pay

## 2017-12-07 ENCOUNTER — Ambulatory Visit: Payer: Federal, State, Local not specified - PPO | Admitting: Internal Medicine

## 2017-12-07 ENCOUNTER — Encounter: Payer: Self-pay | Admitting: Internal Medicine

## 2017-12-07 VITALS — BP 132/78 | HR 72 | Ht 63.0 in | Wt 212.4 lb

## 2017-12-07 DIAGNOSIS — K219 Gastro-esophageal reflux disease without esophagitis: Secondary | ICD-10-CM

## 2017-12-07 DIAGNOSIS — Z86010 Personal history of colonic polyps: Secondary | ICD-10-CM

## 2017-12-07 DIAGNOSIS — Z7902 Long term (current) use of antithrombotics/antiplatelets: Secondary | ICD-10-CM | POA: Diagnosis not present

## 2017-12-07 DIAGNOSIS — R1310 Dysphagia, unspecified: Secondary | ICD-10-CM

## 2017-12-07 NOTE — Progress Notes (Signed)
HISTORY OF PRESENT ILLNESS:  [name] is a pleasant 62 y.o. female with multiple significant medical problems including a history of spontaneous coronary artery dissection June 2014 for which she is on chronic aspirin and Plavix therapy. She has been followed in this office for GERD complicated by peptic stricture requiring esophageal dilation and colorectal neoplasia surveillance with a personal history of adenomatous colon polyps and a family history of colon cancer in her brother. Also a history of constipation. She presents today with chief complaints of recurrent dysphagia, alternating bowel habits with fecal incontinence, and the need for surveillance colonoscopy. He was last seen in this office April 2016. See that dictation. Last colonoscopy was January 2012. Diminutive polyps removed. Follow-up in 5 years recommended. She also underwent upper endoscopy with esophageal dilation (64 [place] Maloney) at that time. This helped her dysphagia. For her reflux she is currently on pantoprazole 40 mg daily. Symptoms are for the most part well controlled.Recurrent dysphagia to solids over the past year has worsened. She feels repeat dilation would be helpful. She also describes a globus type sensation. In terms of her bowels, she will notice some fecal seepage with more loose stools. She is on no particular agents for her bowels. Review of outside laboratories from February 2019 finds normal liver function tests. Last hemoglobin 12.3. Previous abdominal ultrasound in 2014 revealed no gallstones and normal liver parenchyma  REVIEW OF SYSTEMS:  All non-GI ROS negative unless otherwise stated in the history of present illness except for anxiety  Past Medical History:  Diagnosis Date  . Angina effort 05/22/2013  . Anxiety   . CAD (coronary artery disease), possible SCAD 12/14/2012  . Colon polyps   . Duodenitis   . Dyspnea on exertion    LEXISCAN, 09/30/2008 - normal, EKG negative for ischemia, no  ECG changes  . Esophageal stricture   . Gastritis   . GERD (gastroesophageal reflux disease)   . Gout    "only from RX given after MI" (01/22/2013)  . H/O hiatal hernia   . History of esophageal stricture   . Hypertension    2D ECHO, 09/30/2008 - EF >55%, normal: RENAL DOPPLER, 03/21/2007 - normal duplex study  . Hypothyroidism   . IBS (irritable bowel syndrome)   . Ischemic colitis ([place])   . Lower GI bleeding    10/07/11 "I've had 3 episodes in the past year"  . Myocardial infarction [facility]) 12/10/2012   "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)  . Tubular adenoma polyp of rectum 2008    Past Surgical History:  Procedure Laterality Date  . ABDOMINAL HYSTERECTOMY  1999  . ANTERIOR LUMBAR FUSION  2010   "L4-5" (01/22/2013)  . BACK SURGERY  09/2008  . CARDIAC CATHETERIZATION N/A 02/06/2015   Procedure: Left Heart Cath and Coronary Angiography;  Surgeon: Leonie Man, MD;  Location: [facility];  Service: Cardiovascular;  Laterality: N/A;  . ESOPHAGEAL DILATION  08/2010  . ESOPHAGEAL DILATION     "once or twice" (01/22/2013)  . HAMMER TOE SURGERY Bilateral ~ 2002  . LEFT HEART CATHETERIZATION WITH CORONARY ANGIOGRAM N/A 12/11/2012   Procedure: LEFT HEART CATHETERIZATION WITH CORONARY ANGIOGRAM;  Surgeon: Leonie Man, MD;  Location: [facility] CATH LAB;  Service: Cardiovascular;  Laterality: N/A;  . LEFT HEART CATHETERIZATION WITH CORONARY ANGIOGRAM N/A 12/15/2012   Procedure: LEFT HEART CATHETERIZATION WITH CORONARY ANGIOGRAM;  Surgeon: Leonie Man, MD;  Location: [facility] CATH LAB;  Service: Cardiovascular;  Laterality: N/A;  . PERIPHERAL VENOUS STUDY  10/03/2008   No evidence of DVT, superficial thrombosis, or Baker's cyst  . TONSILLECTOMY AND ADENOIDECTOMY  1960's  . [place]    Social History [name]  reports that she has never smoked. She has never used smokeless tobacco. She reports that she does not drink alcohol or use drugs.  family history  includes Colon cancer in her brother; Heart attack in her brother; Kidney disease in her mother; Stroke in her father.  No Known Allergies     PHYSICAL EXAMINATION: Vital signs: BP 132/78   Pulse 72   Ht 5\' 3"  (1.6 m)   Wt 212 lb 6 oz (96.3 kg)   BMI 37.62 kg/m   Constitutional: generally well-appearing, no acute distress Psychiatric: alert and oriented x3, cooperative Eyes: extraocular movements intact, anicteric, conjunctiva pink Mouth: oral pharynx moist, no lesions Neck: supple without thyromegaly Lymph:no lymphadenopathy Cardiovascular: heart regular rate and rhythm, no murmur Lungs: clear to auscultation bilaterally Abdomen: soft,obese, nontender, nondistended, no obvious ascites, no peritoneal signs, normal bowel sounds, no organomegaly Rectal:deferred until colonoscopy Extremities: no clubbing or cyanosis. Trace lower extremity edema bilaterally Skin: no lesions on visible extremities Neuro: No focal deficits. Cranial nerves intact  ASSESSMENT:  #1. GERD. Reflux symptoms controlled with PPI #2. Recurrent dysphagia likely secondary to esophageal stricture. #3. Fecal incontinence with alternating bowel habits as described #4. History of adenomatous colon polyps and family history of colon cancer. Overdue for surveillance #5. Multiple medical problems including coronary artery disease on Plavix and aspirin.   PLAN:  #1. Continue pantoprazole #2. Reflux precautions with attention to weight loss #3. Schedule upper endoscopy with esophageal dilation. The patient is HIGH RISK given her comorbidities and the need to address her antiplatelet therapy.The nature of the procedure, as well as the risks, benefits, and alternatives were carefully and thoroughly reviewed with the patient. Ample time for discussion and questions allowed. The patient understood, was satisfied, and agreed to proceed. #4. Daily fiber supplementation with Metamucil to assist with bowel habit  irregularities and fecal seepage #5. Surveillance colonoscopy. The patient is high-risk as above.The nature of the procedure, as well as the risks, benefits, and alternatives were carefully and thoroughly reviewed with the patient. Ample time for discussion and questions allowed. The patient understood, was satisfied, and agreed to proceed. #6. Would recommend holding Plavix 1 week prior to her therapeutic procedure (dilation). She should remain on aspirin however. We will confer with Dr. Gwenlyn Found to confirm that this is clinically acceptable  A copy of this consultation note has been sent to Dr. Gwenlyn Found and Dr. Lorelei Pont

## 2017-12-07 NOTE — Telephone Encounter (Signed)
[facility] Pre-operative Risk Assessment     Request for surgical clearance:     Endoscopy Procedure  What type of surgery is being performed?     Endoscopy/Colonoscopy  When is this surgery scheduled?     TBD  What type of clearance is required ?   Pharmacy  Are there any medications that need to be held prior to surgery and how long? Plavix - your discretion.  Practice name and name of physician performing surgery?      [facility]  What is your office phone and fax number?      Phone- [phone]  [phone]  Anesthesia type (None, local, MAC, general) ?       MAC

## 2017-12-07 NOTE — Telephone Encounter (Signed)
   Primary Cardiologist:Jonathan Gwenlyn Found, MD  Chart reviewed as part of pre-operative protocol coverage.  Will forward note to Dr. Gwenlyn Found for recommendations for holding Plavix for upcoming EGD/colonoscopy. Patient will then need phone call from APP to assess for change in clinical status since last OV.  Patient has a history of CAD with non-ST elevation myocardial infarction in 2014 with subtotal occlusion of LAD at cardiac catheterization suspicious for spontaneous coronary artery dissection.  Relook catheterization several days later demonstrated a totally occluded mid LAD.  She has been managed medically without PCI.  Cardiac catheterization in July 2016 demonstrated a chronically occluded mid LAD, small ostial OM2 90%, OM3 60% diffuse disease in the RCA.  She was last seen by Dr. Gwenlyn Found January 2019.  Richardson Dopp, [name]  12/07/2017, 3:09 PM

## 2017-12-07 NOTE — Patient Instructions (Signed)
We will call you in the next couple of weeks to schedule your procedure.

## 2017-12-09 NOTE — Telephone Encounter (Signed)
   Primary Cardiologist: Quay Burow, MD  Chart reviewed as part of pre-operative protocol coverage. Patient was contacted 12/09/2017 in reference to pre-operative risk assessment for pending surgery as outlined below. Patient has a history of CAD with non-ST elevation myocardial infarction in 2014 with subtotal occlusion of LAD at cardiac catheterization suspicious for spontaneous coronary artery dissection. Relook catheterization several days later demonstrated a totally occluded mid LAD.  She has been managed medically without PCI. Cardiac catheterization in July 2016 demonstrated a chronically occluded mid LAD, small ostial OM2 90%, OM3 60% diffuse disease in the RCA. She was last seen by Dr. Gwenlyn Found January 2019 and was doing well with plan for f/u in 1 year. Also has hx HTN, obesity, hypothyroidism.  [name] was last seen 08/2017 by Dr. Gwenlyn Found. Since that day, [name] has done well without any anginal sx. She is able to perform 8 METS per [facility] without any angina.  Therefore, based on ACC/AHA guidelines, the patient would be at acceptable risk for the planned procedure without further cardiovascular testing.   Antiplatelet therapy was reviewed with Dr. Gwenlyn Found who states "Okay to interrupt antiplatelet therapy for GI procedures." Typically we request you resume as soon as felt safe by provider performing procedure.  I will route this recommendation to the requesting party via Epic fax function and remove from pre-op pool.  Please call with questions.  Charlie Pitter, [name] 12/09/2017, 3:12 PM

## 2017-12-09 NOTE — Telephone Encounter (Signed)
Okay to interrupt antiplatelet therapy for GI procedures

## 2017-12-14 ENCOUNTER — Telehealth: Payer: Self-pay

## 2017-12-14 NOTE — Telephone Encounter (Signed)
-----   Message from Irene Shipper, MD sent at 12/08/2017 10:35 AM EDT ----- Thank you JB.  Magda Paganini, please note ----- Message ----- From: Lorretta Harp, MD Sent: 12/08/2017  10:01 AM To: Irene Shipper, MD  Fine with me John to interrupt antiplatelet therapy for her GI procedures.  JJB ----- Message ----- From: Irene Shipper, MD Sent: 12/07/2017  12:39 PM To: Lorretta Harp, MD

## 2017-12-30 ENCOUNTER — Encounter: Payer: Self-pay | Admitting: Internal Medicine

## 2018-01-12 DIAGNOSIS — K080 Exfoliation of teeth due to systemic causes: Secondary | ICD-10-CM | POA: Diagnosis not present

## 2018-01-13 ENCOUNTER — Other Ambulatory Visit: Payer: Self-pay | Admitting: Cardiovascular Disease

## 2018-01-16 NOTE — Telephone Encounter (Signed)
Documented that patient can hold Plavix for 5 -7 days per Dr. Gwenlyn Found on appointment screen for previsit nurse

## 2018-01-31 ENCOUNTER — Other Ambulatory Visit: Payer: Self-pay | Admitting: Cardiovascular Disease

## 2018-02-22 ENCOUNTER — Telehealth: Payer: Self-pay | Admitting: *Deleted

## 2018-02-22 NOTE — Telephone Encounter (Signed)
PA for pantoprazole complete and approved.

## 2018-02-23 ENCOUNTER — Telehealth: Payer: Self-pay | Admitting: Cardiovascular Disease

## 2018-02-23 MED ORDER — PANTOPRAZOLE SODIUM 40 MG PO TBEC: 40.0000 mg | DELAYED_RELEASE_TABLET | Freq: Two times a day (BID) | ORAL | 3 refills | Status: DC

## 2018-02-23 NOTE — Telephone Encounter (Signed)
New Message        *STAT* If patient is at the pharmacy, call can be transferred to refill team.   1. Which medications need to be refilled? (please list name of each medication and dose if known) Protonix  2. Which pharmacy/location (including street and city if local pharmacy) is medication to be sent to?[facility]  3. Do they need a 30 day or 90 day supply? [place]

## 2018-02-23 NOTE — Telephone Encounter (Signed)
rx submitted.  

## 2018-03-02 ENCOUNTER — Telehealth: Payer: Self-pay | Admitting: Cardiovascular Disease

## 2018-03-02 NOTE — Telephone Encounter (Signed)
Left message stating a copy of medication will be left at the front desk for pick up.

## 2018-03-02 NOTE — Telephone Encounter (Signed)
New Message:   Pt would like for you to make a copy of her list of medicine, so she can take it to another doctor. Please call, she will pick it up.

## 2018-03-07 ENCOUNTER — Ambulatory Visit (AMBULATORY_SURGERY_CENTER): Payer: Self-pay

## 2018-03-07 VITALS — Ht 63.0 in | Wt 214.2 lb

## 2018-03-07 DIAGNOSIS — Z800 Family history of malignant neoplasm of digestive organs: Secondary | ICD-10-CM

## 2018-03-07 DIAGNOSIS — R1310 Dysphagia, unspecified: Secondary | ICD-10-CM

## 2018-03-07 DIAGNOSIS — Z86010 Personal history of colonic polyps: Secondary | ICD-10-CM

## 2018-03-07 MED ORDER — NA SULFATE-K SULFATE-MG SULF 17.5-3.13-1.6 GM/177ML PO SOLN: 1.0000 | Freq: Once | ORAL | 0 refills | Status: AC

## 2018-03-07 NOTE — Progress Notes (Signed)
Per pt, no allergies to soy or egg products.Pt not taking any weight loss meds or using  O2 at home.  Pt refused emmi video. 

## 2018-03-17 ENCOUNTER — Ambulatory Visit (AMBULATORY_SURGERY_CENTER): Payer: Federal, State, Local not specified - PPO | Admitting: Internal Medicine

## 2018-03-17 ENCOUNTER — Encounter: Payer: Self-pay | Admitting: Internal Medicine

## 2018-03-17 VITALS — BP 137/78 | HR 63 | Temp 96.9°F | Resp 22 | Ht 63.0 in | Wt 212.0 lb

## 2018-03-17 DIAGNOSIS — Z800 Family history of malignant neoplasm of digestive organs: Secondary | ICD-10-CM | POA: Diagnosis not present

## 2018-03-17 DIAGNOSIS — D120 Benign neoplasm of cecum: Secondary | ICD-10-CM

## 2018-03-17 DIAGNOSIS — D128 Benign neoplasm of rectum: Secondary | ICD-10-CM

## 2018-03-17 DIAGNOSIS — K219 Gastro-esophageal reflux disease without esophagitis: Secondary | ICD-10-CM

## 2018-03-17 DIAGNOSIS — Z1211 Encounter for screening for malignant neoplasm of colon: Secondary | ICD-10-CM | POA: Diagnosis not present

## 2018-03-17 DIAGNOSIS — R1310 Dysphagia, unspecified: Secondary | ICD-10-CM | POA: Diagnosis not present

## 2018-03-17 DIAGNOSIS — K222 Esophageal obstruction: Secondary | ICD-10-CM | POA: Diagnosis not present

## 2018-03-17 DIAGNOSIS — Z86010 Personal history of colonic polyps: Secondary | ICD-10-CM

## 2018-03-17 MED ORDER — SODIUM CHLORIDE 0.9 % IV SOLN: 500.0000 mL | Freq: Once | INTRAVENOUS | Status: DC

## 2018-03-17 NOTE — Op Note (Signed)
[facility] Patient Name: [name] Procedure Date: 03/17/2018 8:05 AM MRN: [id number] Endoscopist: Docia Chuck. Henrene Pastor , MD Age: 62 Referring MD:  Date of Birth: 1956/02/26 Gender: Female Account #: [id number] Procedure:                Colonoscopy, with cold snare polypectomy x 2 Indications:              High risk colon cancer surveillance: Personal                            history of non-advanced adenoma. Previous                            examinations 2003, 2008, 2012. Also brother with                            history of colon cancer Medicines:                Monitored Anesthesia Care Procedure:                Pre-Anesthesia Assessment:                           - Prior to the procedure, a History and Physical                            was performed, and patient medications and                            allergies were reviewed. The patient's tolerance of                            previous anesthesia was also reviewed. The risks                            and benefits of the procedure and the sedation                            options and risks were discussed with the patient.                            All questions were answered, and informed consent                            was obtained. Prior Anticoagulants: The patient has                            taken Plavix (clopidogrel), last dose was 7 days                            prior to procedure. ASA Grade Assessment: III - A                            patient with severe systemic disease. After  reviewing the risks and benefits, the patient was                            deemed in satisfactory condition to undergo the                            procedure.                           After obtaining informed consent, the colonoscope                            was passed under direct vision. Throughout the                            procedure, the patient's blood pressure, pulse, and                             oxygen saturations were monitored continuously. The                            Colonoscope was introduced through the anus and                            advanced to the the cecum, identified by                            appendiceal orifice and ileocecal valve. The                            ileocecal valve, appendiceal orifice, and rectum                            were photographed. The quality of the bowel                            preparation was excellent. The colonoscopy was                            performed without difficulty. The patient tolerated                            the procedure well. The bowel preparation used was                            SUPREP. Scope In: 8:35:50 AM Scope Out: 8:50:34 AM Scope Withdrawal Time: 0 hours 11 minutes 46 seconds  Total Procedure Duration: 0 hours 14 minutes 44 seconds  Findings:                 Two polyps were found in the rectum and cecum. The                            polyps were 1 to 3 mm in size. These polyps were  removed with a cold snare. Resection and retrieval                            were complete.                           The exam was otherwise without abnormality on                            direct and retroflexion views. Complications:            No immediate complications. Estimated blood loss:                            None. Estimated Blood Loss:     Estimated blood loss: none. Impression:               - Two 1 to 3 mm polyps in the rectum and in the                            cecum, removed with a cold snare. Resected and                            retrieved.                           - The examination was otherwise normal on direct                            and retroflexion views. Recommendation:           - Repeat colonoscopy in 5 years for surveillance.                           - Resume Plavix (clopidogrel) today at prior dose.                            - Patient has a contact number available for                            emergencies. The signs and symptoms of potential                            delayed complications were discussed with the                            patient. Return to normal activities tomorrow.                            Written discharge instructions were provided to the                            patient.                           - Resume previous diet.                           -  Continue present medications.                           - Await pathology results. Docia Chuck. Henrene Pastor, MD 03/17/2018 9:08:27 AM This report has been signed electronically.

## 2018-03-17 NOTE — Progress Notes (Signed)
To PACU, VSS. Report to [email or web address] 

## 2018-03-17 NOTE — Progress Notes (Signed)
Pt's states no medical or surgical changes since previsit or office visit. 

## 2018-03-17 NOTE — Patient Instructions (Signed)
Discharge instructions given. Handouts on polyps,dilatation diet and stricture. Resume previous medications. Resume Plavix today. YOU HAD AN ENDOSCOPIC PROCEDURE TODAY AT [facility]:   Refer to the procedure report that was given to you for any specific questions about what was found during the examination.  If the procedure report does not answer your questions, please call your gastroenterologist to clarify.  If you requested that your care partner not be given the details of your procedure findings, then the procedure report has been included in a sealed envelope for you to review at your convenience later.  YOU SHOULD EXPECT: Some feelings of bloating in the abdomen. Passage of more gas than usual.  Walking can help get rid of the air that was put into your GI tract during the procedure and reduce the bloating. If you had a lower endoscopy (such as a colonoscopy or flexible sigmoidoscopy) you may notice spotting of blood in your stool or on the toilet paper. If you underwent a bowel prep for your procedure, you may not have a normal bowel movement for a few days.  Please Note:  You might notice some irritation and congestion in your nose or some drainage.  This is from the oxygen used during your procedure.  There is no need for concern and it should clear up in a day or so.  SYMPTOMS TO REPORT IMMEDIATELY:   Following lower endoscopy (colonoscopy or flexible sigmoidoscopy):  Excessive amounts of blood in the stool  Significant tenderness or worsening of abdominal pains  Swelling of the abdomen that is new, acute  Fever of 100F or higher   Following upper endoscopy (EGD)  Vomiting of blood or coffee ground material  New chest pain or pain under the shoulder blades  Painful or persistently difficult swallowing  New shortness of breath  Fever of 100F or higher  Black, tarry-looking stools  For urgent or emergent issues, a gastroenterologist can be reached at any hour  by calling [phone].   DIET:  We do recommend a small meal at first, but then you may proceed to your regular diet.  Drink plenty of fluids but you should avoid alcoholic beverages for 24 hours.  ACTIVITY:  You should plan to take it easy for the rest of today and you should NOT DRIVE or use heavy machinery until tomorrow (because of the sedation medicines used during the test).    FOLLOW UP: Our staff will call the number listed on your records the next business day following your procedure to check on you and address any questions or concerns that you may have regarding the information given to you following your procedure. If we do not reach you, we will leave a message.  However, if you are feeling well and you are not experiencing any problems, there is no need to return our call.  We will assume that you have returned to your regular daily activities without incident.  If any biopsies were taken you will be contacted by phone or by letter within the next 1-3 weeks.  Please call us at [phone] if you have not heard about the biopsies in 3 weeks.    SIGNATURES/CONFIDENTIALITY: You and/or your care partner have signed paperwork which will be entered into your electronic medical record.  These signatures attest to the fact that that the information above on your After Visit Summary has been reviewed and is understood.  Full responsibility of the confidentiality of this discharge information lies with you  and/or your care-partner. 

## 2018-03-17 NOTE — Op Note (Signed)
[facility] Patient Name: [name] Procedure Date: 03/17/2018 8:05 AM MRN: [id number] Endoscopist: Docia Chuck. Henrene Pastor , MD Age: 62 Referring MD:  Date of Birth: [birth year] Gender: Female Account #: [id number] Procedure:                Upper GI endoscopy, with Venia Minks dilation of the                            esophagus - 49 F Indications:              Dysphagia Medicines:                Monitored Anesthesia Care Procedure:                Pre-Anesthesia Assessment:                           - Prior to the procedure, a History and Physical                            was performed, and patient medications and                            allergies were reviewed. The patient's tolerance of                            previous anesthesia was also reviewed. The risks                            and benefits of the procedure and the sedation                            options and risks were discussed with the patient.                            All questions were answered, and informed consent                            was obtained. Prior Anticoagulants: The patient has                            taken Plavix (clopidogrel), last dose was 7 days                            prior to procedure. ASA Grade Assessment: III - A                            patient with severe systemic disease. After                            reviewing the risks and benefits, the patient was                            deemed in satisfactory condition to undergo the  procedure.                           After obtaining informed consent, the endoscope was                            passed under direct vision. Throughout the                            procedure, the patient's blood pressure, pulse, and                            oxygen saturations were monitored continuously. The                            Endoscope was introduced through the mouth, and   advanced to the second part of duodenum. The upper                            GI endoscopy was accomplished without difficulty.                            The patient tolerated the procedure well. Scope In: Scope Out: Findings:                 One benign-appearing, intrinsic mild stenosis was                            found 40 cm from the incisors. The scope was                            withdrawn. Dilation was performed with a Maloney                            dilator with no resistance at 59 Fr.                           The exam of the esophagus was otherwise normal.                           The stomach was normal.                           The examined duodenum was normal.                           The cardia and gastric fundus were normal on                            retroflexion. Complications:            No immediate complications. Estimated Blood Loss:     Estimated blood loss: none. Impression:               - Benign-appearing esophageal stenosis. Dilated.                           -  Normal stomach.                           - Normal examined duodenum.                           - No specimens collected. Recommendation:           - Patient has a contact number available for                            emergencies. The signs and symptoms of potential                            delayed complications were discussed with the                            patient. Return to normal activities tomorrow.                            Written discharge instructions were provided to the                            patient.                           - Post dilation diet.                           - Continue present medications.                           - Resume Plavix (clopidogrel) at prior dose today. Docia Chuck. Henrene Pastor, MD 03/17/2018 9:11:25 AM This report has been signed electronically.

## 2018-03-17 NOTE — Progress Notes (Signed)
Called to room to assist during endoscopic procedure.  Patient ID and intended procedure confirmed with present staff. Received instructions for my participation in the procedure from the performing physician.  

## 2018-03-20 ENCOUNTER — Telehealth: Payer: Self-pay

## 2018-03-20 NOTE — Telephone Encounter (Signed)
  Follow up Call-  Call back number 03/17/2018  Post procedure Call Back phone  # [phone]  Permission to leave phone message Yes  Some recent data might be hidden     Patient questions:  Do you have a fever, pain , or abdominal swelling? No. Pain Score  0 *  Have you tolerated food without any problems? Yes.    Have you been able to return to your normal activities? Yes.    Do you have any questions about your discharge instructions: Diet   No. Medications  No. Follow up visit  No.  Do you have questions or concerns about your Care? No.  Actions: * If pain score is 4 or above: No action needed, pain <4.

## 2018-03-22 ENCOUNTER — Encounter: Payer: Self-pay | Admitting: Internal Medicine

## 2018-04-05 NOTE — Progress Notes (Addendum)
[facility] [place], [place], [place] [address] [phone]  Date:  04/06/2018   Name:  [name]   DOB:  [birth year]   MRN:  [id number]  PCP:  Darreld Mclean, MD    Chief Complaint: Foot Pain (left foot/ankle pain, trouble walking, possible gout, no known inury) and Medication Management (would like phentermine removed off list, it has been discontinued but wont come off of chart)   History of Present Illness:  [name] is a 62 y.o. very pleasant female patient who presents with the following:  History of pre-diabetes, CAD, NSTEMI, HTN, hypothyroidism I last saw her in March of this year for a cough Here today with concern of left foot pain- she noted a severe pain in her ankle a few days ago without apparent cause NKI It was not red or swollen, did not feel warm to her necessarily  She awoke with pain 3 days ago, she thought it must have been gout She used tylenol and it did seem to eventually go away  She avoids NSAIDs due to her heart disease  She did have gout just once in the past in 2014  She is trying to drink plenty of water No unusual foods No family history of gout She has some old allopurinol left at home, but it is quite old so she did not try it   Lab Results  Component Value Date   TSH 3.02 01/12/2017   Pap: Dr. Nori Riis is her GYN but she is overdue for a recheck  Mammo: done at [facility]  Needs hep C screening and TSH   Patient Active Problem List   Diagnosis Date Noted  . Pre-diabetes 01/14/2017  . 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 Tachycardia, somewhat irregular, episodic 10/08/2013  . Dizziness 10/08/2013  . Angina effort 05/22/2013  . Unstable angina ([place]) 01/22/2013  . Hyperlipidemia 01/01/2013  . CAD (coronary artery disease), possible SCAD 12/14/2012  . NSTEMI (non-ST elevated myocardial infarction) ([place]) 12/10/2012  . Essential hypertension 12/10/2012   . Hypothyroidism 12/10/2012  . Obesity (BMI 35.0-39.9 without comorbidity) 12/10/2012  . Gastroenteritis 10/08/2011  . Acute ischemic colitis ([place]) 08/09/2011  . DYSPHAGIA UNSPECIFIED 07/01/2010  . RECTAL BLEEDING Jan 2013 11/04/2009  . GERD 02/11/2009  . Irritable bowel syndrome 02/11/2009  . PERSONAL HX COLONIC POLYPS 02/11/2009    Past Medical History:  Diagnosis Date  . Angina effort 05/22/2013  . Anxiety   . CAD (coronary artery disease), possible SCAD 12/14/2012  . Colon polyps   . Duodenitis   . Dyspnea on exertion    LEXISCAN, 09/30/2008 - normal, EKG negative for ischemia, no ECG changes  . Esophageal stricture   . Gastritis   . GERD (gastroesophageal reflux disease)   . Gout    "only from RX given after MI" (01/22/2013)  . H/O hiatal hernia   . History of esophageal stricture   . Hypertension    2D ECHO, 09/30/2008 - EF >55%, normal: RENAL DOPPLER, 03/21/2007 - normal duplex study  . Hypothyroidism   . IBS (irritable bowel syndrome)   . Ischemic colitis ([place])   . Lower GI bleeding    10/07/11 "I've had 3 episodes in the past year"  . Myocardial infarction [facility]) 12/10/2012   "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)  . Tubular adenoma polyp of rectum 2008    Past Surgical History:  Procedure Laterality Date  . ABDOMINAL HYSTERECTOMY  [place]  2010   "L4-5" (01/22/2013)  . BACK SURGERY  09/2008   FUSION/l4-5  . CARDIAC CATHETERIZATION N/A 02/06/2015   Procedure: Left Heart Cath and Coronary Angiography;  Surgeon: Leonie Man, MD;  Location: [place] [facility];  Service: Cardiovascular;  Laterality: N/A;  . ESOPHAGEAL DILATION  08/2010  . ESOPHAGEAL DILATION     "once or twice" (01/22/2013)  . HAMMER TOE SURGERY Bilateral ~ 2002  . LEFT HEART CATHETERIZATION WITH CORONARY ANGIOGRAM N/A 12/11/2012   Procedure: LEFT HEART CATHETERIZATION WITH CORONARY ANGIOGRAM;  Surgeon: Leonie Man, MD;  Location: [facility] CATH LAB;  Service:  Cardiovascular;  Laterality: N/A;  . LEFT HEART CATHETERIZATION WITH CORONARY ANGIOGRAM N/A 12/15/2012   Procedure: LEFT HEART CATHETERIZATION WITH CORONARY ANGIOGRAM;  Surgeon: Leonie Man, MD;  Location: [facility] CATH LAB;  Service: Cardiovascular;  Laterality: N/A;  . PERIPHERAL VENOUS STUDY  10/03/2008   No evidence of DVT, superficial thrombosis, or Baker's cyst  . TONSILLECTOMY AND ADENOIDECTOMY  1960's  . TUBAL LIGATION  1985    Social History   Tobacco Use  . Smoking status: Never Smoker  . Smokeless tobacco: Never Used  Substance Use Topics  . Alcohol use: No  . Drug use: No    Family History  Problem Relation Age of Onset  . Colon cancer Brother   . Kidney disease Mother   . Stroke Father   . Multiple myeloma Sister   . Heart attack Brother   . Other Brother   . Other Brother     No Known Allergies  Medication list has been reviewed and updated.  Current Outpatient Medications on File Prior to Visit  Medication Sig Dispense Refill  . ALPRAZolam (XANAX) 0.5 MG tablet TAKE 1 TABLET BY MOUTH 3 TIMES DAILY AS NEEDED FOR ANXIETY 90 tablet 2  . amLODipine (NORVASC) 5 MG tablet TAKE 1 TABLET (5 MG TOTAL) BY MOUTH DAILY. 30 tablet 10  . aspirin EC 81 MG EC tablet Take 1 tablet (81 mg total) by mouth daily.    . clopidogrel (PLAVIX) 75 MG tablet TAKE 1 TABLET BY MOUTH ONCE A DAY WITH BREAKFAST 30 tablet 9  . estradiol (ESTRACE) 1 MG tablet Take 1 mg by mouth daily.    . Evolocumab (REPATHA SURECLICK) 824 MG/ML SOAJ Inject 140 mg into the skin every 14 (fourteen) days. 2 pen 12  . isosorbide mononitrate (IMDUR) 60 MG 24 hr tablet TAKE 1 TABLET BY MOUTH ONCE A DAY 30 tablet 10  . lisinopril (PRINIVIL,ZESTRIL) 5 MG tablet TAKE ONE TABLET BY MOUTH ONE TIME DAILY  90 tablet 3  . metoprolol tartrate (LOPRESSOR) 25 MG tablet TAKE 1 TABLET BY MOUTH TWICE A DAY 60 tablet 10  . nitroGLYCERIN (NITROSTAT) 0.4 MG SL tablet Place 1 tablet (0.4 mg total) under the tongue every 5 (five)  minutes as needed for chest pain. 25 tablet 5  . pantoprazole (PROTONIX) 40 MG tablet Take 1 tablet (40 mg total) by mouth 2 (two) times daily. 90 tablet 3  . RANEXA 1000 MG SR tablet TAKE 1 TABLET BY MOUTH 2 TIMES DAILY. (Patient taking differently: TAKE 1 TABLET BY MOUTH DAILY) 180 tablet 2  . [DISCONTINUED] phentermine 15 MG capsule Take 15 mg by mouth every morning.       No current facility-administered medications on file prior to visit.     Review of Systems:  As per HPI- otherwise negative. No fever or chills No CP or  SOB No vomiting or diarrhea   Physical Examination: Vitals:   04/06/18 1304  BP: 112/72  Pulse: 68  Resp: 16  Temp: 98.6 F (37 C)  SpO2: 98%   Vitals:   04/06/18 1304  Weight: 214 lb (97.1 kg)  Height: 5' 3"  (1.6 m)   Body mass index is 37.91 kg/m. Ideal Body Weight: Weight in (lb) to have BMI = 25: 140.8  GEN: WDWN, NAD, Non-toxic, A & O x 3, obese, looks well  HEENT: Atraumatic, Normocephalic. Neck supple. No masses, No LAD. Ears and Nose: No external deformity. CV: RRR, No M/G/R. No JVD. No thrill. No extra heart sounds. PULM: CTA B, no wheezes, crackles, rhonchi. No retractions. No resp. distress. No accessory muscle use. EXTR: No c/c/e NEURO Normal gait.  PSYCH: Normally interactive. Conversant. Not depressed or anxious appearing.  Calm demeanor.  Pt currently has minimal to no tenderness in the anterior left ankle  Foot is NV intact, no heat or swelling, no redness Normal ROM Achilles intact  Assessment and Plan: Essential hypertension - Plan: Comprehensive metabolic panel  Screening for deficiency anemia - Plan: CBC  Pre-diabetes - Plan: Hemoglobin A1c  Encounter for hepatitis C screening test for low risk patient - Plan: Hepatitis C antibody  Acute idiopathic gout of left ankle - Plan: Uric acid, allopurinol (ZYLOPRIM) 100 MG tablet  Post-menopause - Plan: estradiol (ESTRACE) 1 MG tablet  Possible gout as cause of left ankle  pain Uric acid level today allopurinol to have on hand  Refilled her estrogen temporarily- this rx is per her GYN Will plan further follow- up pending labs.   Signed Lamar Blinks, MD  Received her labs 9/7 - will call pt to report high uric acid and also send letter  Results for orders placed or performed in visit on 04/06/18  CBC  Result Value Ref Range   WBC 6.9 4.0 - 10.5 K/uL   RBC 3.93 3.87 - 5.11 Mil/uL   Platelets 269.0 150.0 - 400.0 K/uL   Hemoglobin 12.2 12.0 - 15.0 g/dL   HCT 35.9 (L) 36.0 - 46.0 %   MCV 91.3 78.0 - 100.0 fl   MCHC 34.2 30.0 - 36.0 g/dL   RDW 13.4 11.5 - 15.5 %  Comprehensive metabolic panel  Result Value Ref Range   Sodium 136 135 - 145 mEq/L   Potassium 4.1 3.5 - 5.1 mEq/L   Chloride 101 96 - 112 mEq/L   CO2 31 19 - 32 mEq/L   Glucose, Bld 101 (H) 70 - 99 mg/dL   BUN 16 6 - 23 mg/dL   Creatinine, Ser 1.23 (H) 0.40 - 1.20 mg/dL   Total Bilirubin 0.4 0.2 - 1.2 mg/dL   Alkaline Phosphatase 96 39 - 117 U/L   AST 14 0 - 37 U/L   ALT 15 0 - 35 U/L   Total Protein 6.9 6.0 - 8.3 g/dL   Albumin 3.9 3.5 - 5.2 g/dL   Calcium 9.5 8.4 - 10.5 mg/dL   GFR 47.02 (L) >60.00 mL/min  Hemoglobin A1c  Result Value Ref Range   Hgb A1c MFr Bld 5.8 4.6 - 6.5 %  Uric acid  Result Value Ref Range   Uric Acid, Serum 7.9 (H) 2.4 - 7.0 mg/dL

## 2018-04-06 ENCOUNTER — Ambulatory Visit: Payer: Federal, State, Local not specified - PPO | Admitting: Family Medicine

## 2018-04-06 ENCOUNTER — Encounter

## 2018-04-06 ENCOUNTER — Encounter: Payer: Self-pay | Admitting: Family Medicine

## 2018-04-06 VITALS — BP 112/72 | HR 68 | Temp 98.6°F | Resp 16 | Ht 63.0 in | Wt 214.0 lb

## 2018-04-06 DIAGNOSIS — I10 Essential (primary) hypertension: Secondary | ICD-10-CM

## 2018-04-06 DIAGNOSIS — Z130 Encounter for screening for diseases of the blood and blood-forming organs and certain disorders involving the immune mechanism: Secondary | ICD-10-CM

## 2018-04-06 DIAGNOSIS — R7303 Prediabetes: Secondary | ICD-10-CM

## 2018-04-06 DIAGNOSIS — Z1159 Encounter for screening for other viral diseases: Secondary | ICD-10-CM

## 2018-04-06 DIAGNOSIS — M10072 Idiopathic gout, left ankle and foot: Secondary | ICD-10-CM

## 2018-04-06 DIAGNOSIS — Z780 Asymptomatic menopausal state: Secondary | ICD-10-CM

## 2018-04-06 LAB — CBC
HCT: 35.9 % — ABNORMAL LOW (ref 36.0–46.0)
Hemoglobin: 12.2 g/dL (ref 12.0–15.0)
MCHC: 34.2 g/dL (ref 30.0–36.0)
MCV: 91.3 fl (ref 78.0–100.0)
PLATELETS: 269 10*3/uL (ref 150.0–400.0)
RBC: 3.93 Mil/uL (ref 3.87–5.11)
RDW: 13.4 % (ref 11.5–15.5)
WBC: 6.9 10*3/uL (ref 4.0–10.5)

## 2018-04-06 LAB — URIC ACID: Uric Acid, Serum: 7.9 mg/dL — ABNORMAL HIGH (ref 2.4–7.0)

## 2018-04-06 LAB — COMPREHENSIVE METABOLIC PANEL
ALBUMIN: 3.9 g/dL (ref 3.5–5.2)
ALK PHOS: 96 U/L (ref 39–117)
ALT: 15 U/L (ref 0–35)
AST: 14 U/L (ref 0–37)
BILIRUBIN TOTAL: 0.4 mg/dL (ref 0.2–1.2)
BUN: 16 mg/dL (ref 6–23)
CHLORIDE: 101 meq/L (ref 96–112)
CO2: 31 mEq/L (ref 19–32)
CREATININE: 1.23 mg/dL — AB (ref 0.40–1.20)
Calcium: 9.5 mg/dL (ref 8.4–10.5)
GFR: 47.02 mL/min — ABNORMAL LOW (ref >60.00)
Glucose, Bld: 101 mg/dL — ABNORMAL HIGH (ref 70–99)
Potassium: 4.1 mEq/L (ref 3.5–5.1)
Sodium: 136 mEq/L (ref 135–145)
TOTAL PROTEIN: 6.9 g/dL (ref 6.0–8.3)

## 2018-04-06 LAB — HEMOGLOBIN A1C: HEMOGLOBIN A1C: 5.8 % (ref 4.6–6.5)

## 2018-04-06 MED ORDER — ALLOPURINOL 100 MG PO TABS: ORAL_TABLET | ORAL | 6 refills | Status: DC

## 2018-04-06 MED ORDER — ESTRADIOL 1 MG PO TABS: 1.0000 mg | ORAL_TABLET | Freq: Every day | ORAL | 2 refills | Status: DC

## 2018-04-06 NOTE — Patient Instructions (Addendum)
We will be in touch with your labs asap For any future gout flares you can try tylenol and I gave you an rx for allopurinol to use as needed- 1 or 2 pills daily  I refilled your estrogen for a couple of months- please go see GYN soon  Take care!

## 2018-04-07 LAB — HEPATITIS C ANTIBODY
Hepatitis C Ab: NONREACTIVE
SIGNAL TO CUT-OFF: 0.04 (ref <1.00)

## 2018-05-05 ENCOUNTER — Telehealth: Payer: Self-pay

## 2018-05-05 NOTE — Telephone Encounter (Signed)
Called patient to inform her opening in office per Dr. Nani Ravens. Patient states she has to pick up her grandchild off the bus during that time. Advised to call [place] office who has a Saturday clinic. Patient states she will.

## 2018-05-05 NOTE — Telephone Encounter (Signed)
Copied from [place] [id number]. Topic: Quick Communication - See Telephone Encounter >> May 05, 2018 10:09 AM Antonieta Iba C wrote: CRM for notification. See Telephone encounter for: 05/05/18.  CB: [phone]   Pt says that she has a cough and would like to know if provider is comfortable sending her something in to pharmacy? PCP is out of the office. No openings in office. Pt says that she is having a hard time sleeping due to cough  Pharmacy: [facility] # [id number] [place] [phone] (Phone) [phone] (Fax)

## 2018-05-05 NOTE — Telephone Encounter (Signed)
I can see her at [phone]. TY.

## 2018-05-06 ENCOUNTER — Emergency Department
Admission: EM | Admit: 2018-05-06 | Discharge: 2018-05-06 | Disposition: A | Discharge status: Home / Self Care | Payer: Federal, State, Local not specified - PPO | Source: Home / Self Care | Attending: Family Medicine | Admitting: Family Medicine

## 2018-05-06 ENCOUNTER — Encounter: Payer: Self-pay | Admitting: Emergency Medicine

## 2018-05-06 DIAGNOSIS — J069 Acute upper respiratory infection, unspecified: Secondary | ICD-10-CM | POA: Diagnosis not present

## 2018-05-06 DIAGNOSIS — B9789 Other viral agents as the cause of diseases classified elsewhere: Secondary | ICD-10-CM

## 2018-05-06 MED ORDER — GUAIFENESIN-CODEINE 100-10 MG/5ML PO SOLN: 10.0000 mL | Freq: Three times a day (TID) | ORAL | 0 refills | Status: DC | PRN

## 2018-05-06 NOTE — Discharge Instructions (Signed)
°  Virtussin (guaifenesin-codeine) is a strong narcotic cough medication.  Only take up to 3 times daily as needed for severe cough.  Be sure to take with large glass of water.  It may cause drowsiness. Do not drive, drink alcohol or take other sedating medications such as Nyquil while taking this medication.   Please follow up with your family medicine provider in 4-5 days if not improving, sooner if significantly worsening- chest pain, trouble breathing, persistent fever (>100.4*F).

## 2018-05-06 NOTE — ED Provider Notes (Signed)
[name]    CSN: [id number] Arrival date & time: 05/06/18  0903     History   Chief Complaint Chief Complaint  Patient presents with  . URI    HPI [name] is a 62 y.o. female.   HPI  .[name] is a 62 y.o. female presenting to UC with c/o 6 days nonproductive cough that is worse at night. Cough has caused some throat pain and abdominal soreness as well as intermittent urinary incontinence.  Denies chest pain or SOB. Denies fever, chills, n/v/d. Denies pain with urination or urinary frequency. No known sick contacts.  [name] has tried OTC Coricidin, Nyquil, and leftover Tessalon pills w/o much relief.  No hx of asthma or COPD.    Past Medical History:  Diagnosis Date  . Angina effort 05/22/2013  . Anxiety   . CAD (coronary artery disease), possible SCAD 12/14/2012  . Colon polyps   . Duodenitis   . Dyspnea on exertion    LEXISCAN, 09/30/2008 - normal, EKG negative for ischemia, no ECG changes  . Esophageal stricture   . Gastritis   . GERD (gastroesophageal reflux disease)   . Gout    "only from RX given after MI" (01/22/2013)  . H/O hiatal hernia   . History of esophageal stricture   . Hypertension    2D ECHO, 09/30/2008 - EF >55%, normal: RENAL DOPPLER, 03/21/2007 - normal duplex study  . Hypothyroidism   . IBS (irritable bowel syndrome)   . Ischemic colitis ([place])   . Lower GI bleeding    10/07/11 "I've had 3 episodes in the past year"  . Myocardial infarction [facility]) 12/10/2012   "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)  . Tubular adenoma polyp of rectum 2008    Patient Active Problem List   Diagnosis Date Noted  . Pre-diabetes 01/14/2017  . 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 Tachycardia, somewhat irregular, episodic 10/08/2013  . Dizziness 10/08/2013  . Angina effort 05/22/2013  . Unstable angina ([place]) 01/22/2013  . Hyperlipidemia 01/01/2013  . CAD (coronary artery disease), possible SCAD 12/14/2012  . NSTEMI  (non-ST elevated myocardial infarction) ([place]) 12/10/2012  . Essential hypertension 12/10/2012  . Hypothyroidism 12/10/2012  . Obesity (BMI 35.0-39.9 without comorbidity) 12/10/2012  . Gastroenteritis 10/08/2011  . Acute ischemic colitis ([place]) 08/09/2011  . DYSPHAGIA UNSPECIFIED 07/01/2010  . RECTAL BLEEDING Jan 2013 11/04/2009  . GERD 02/11/2009  . Irritable bowel syndrome 02/11/2009  . PERSONAL HX COLONIC POLYPS 02/11/2009    Past Surgical History:  Procedure Laterality Date  . ABDOMINAL HYSTERECTOMY  1999  . ANTERIOR LUMBAR FUSION  2010   "L4-5" (01/22/2013)  . BACK SURGERY  09/2008   FUSION/l4-5  . CARDIAC CATHETERIZATION N/A 02/06/2015   Procedure: Left Heart Cath and Coronary Angiography;  Surgeon: Leonie Man, MD;  Location: [place] CV LAB;  Service: Cardiovascular;  Laterality: N/A;  . ESOPHAGEAL DILATION  08/2010  . ESOPHAGEAL DILATION     "once or twice" (01/22/2013)  . HAMMER TOE SURGERY Bilateral ~ 2002  . LEFT HEART CATHETERIZATION WITH CORONARY ANGIOGRAM N/A 12/11/2012   Procedure: LEFT HEART CATHETERIZATION WITH CORONARY ANGIOGRAM;  Surgeon: Leonie Man, MD;  Location[facility] CATH LAB;  Service: Cardiovascular;  Laterality: N/A;  . LEFT HEART CATHETERIZATION WITH CORONARY ANGIOGRAM N/A 12/15/2012   Procedure: LEFT HEART CATHETERIZATION WITH CORONARY ANGIOGRAM;  Surgeon: Leonie Man, MD;  Location: [facility] CATH LAB;  Service: Cardiovascular;  Laterality: N/A;  . PERIPHERAL VENOUS STUDY  10/03/2008  No evidence of DVT, superficial thrombosis, or Baker's cyst  . TONSILLECTOMY AND ADENOIDECTOMY  1960's  . TUBAL LIGATION  1985    OB History   None      Home Medications    Prior to Admission medications   Medication Sig Start Date End Date Taking? Authorizing Provider  ALPRAZolam (XANAX) 0.5 MG tablet TAKE 1 TABLET BY MOUTH 3 TIMES DAILY AS NEEDED FOR ANXIETY 11/18/17   Copland, Jessica C, MD  amLODipine (NORVASC) 5 MG tablet TAKE 1 TABLET (5 MG TOTAL) BY MOUTH  DAILY. 08/03/17   Berry, Jonathan J, MD  aspirin EC 81 MG EC tablet Take 1 tablet (81 mg total) by mouth daily. 01/23/13   Simmons, Brittainy M, [name]  clopidogrel (PLAVIX) 75 MG tablet TAKE 1 TABLET BY MOUTH ONCE A DAY WITH BREAKFAST 07/05/17   Berry, Jonathan J, MD  estradiol (ESTRACE) 1 MG tablet Take 1 tablet (1 mg total) by mouth daily. 04/06/18   Copland, Jessica C, MD  guaiFENesin-codeine 100-10 MG/5ML syrup Take 10 mLs by mouth 3 (three) times daily as needed for cough. 05/06/18   Phelps, Erin O, [name]  isosorbide mononitrate (IMDUR) 60 MG 24 hr tablet TAKE 1 TABLET BY MOUTH ONCE A DAY 07/04/17   Berry, Jonathan J, MD  lisinopril (PRINIVIL,ZESTRIL) 5 MG tablet TAKE ONE TABLET BY MOUTH ONE TIME DAILY  12/02/17   Berry, Jonathan J, MD  metoprolol tartrate (LOPRESSOR) 25 MG tablet TAKE 1 TABLET BY MOUTH TWICE A DAY 08/03/17   Berry, Jonathan J, MD  nitroGLYCERIN (NITROSTAT) 0.4 MG SL tablet Place 1 tablet (0.4 mg total) under the tongue every 5 (five) minutes as needed for chest pain. 04/11/17   Berry, Jonathan J, MD  pantoprazole (PROTONIX) 40 MG tablet Take 1 tablet (40 mg total) by mouth 2 (two) times daily. 02/23/18   Berry, Jonathan J, MD  RANEXA 1000 MG SR tablet TAKE 1 TABLET BY MOUTH 2 TIMES DAILY. Patient taking differently: TAKE 1 TABLET BY MOUTH DAILY 01/31/18   Berry, Jonathan J, MD    Family History Family History  Problem Relation Age of Onset  . Colon cancer Brother   . Kidney disease Mother   . Stroke Father   . Multiple myeloma Sister   . Heart attack Brother   . Other Brother   . Other Brother     Social History Social History   Tobacco Use  . Smoking status: Never Smoker  . Smokeless tobacco: Never Used  Substance Use Topics  . Alcohol use: No  . Drug use: No     Allergies   Patient has no known allergies.   Review of Systems Review of Systems  Constitutional: Negative for chills and fever.  HENT: Positive for sore throat ( from coughing). Negative for congestion,  ear pain, trouble swallowing and voice change.   Respiratory: Positive for cough. Negative for shortness of breath.   Cardiovascular: Negative for chest pain and palpitations.  Gastrointestinal: Positive for abdominal pain ( muscle soreness from cough). Negative for diarrhea, nausea and vomiting.  Musculoskeletal: Negative for arthralgias, back pain and myalgias.  Skin: Negative for rash.     Physical Exam Triage Vital Signs ED Triage Vitals [05/06/18 0928]  Enc Vitals Group     BP 128/87     Pulse Rate 80     Resp      Temp 98.9 F (37.2 C)     Temp Source Oral     SpO2 96 %       Weight 210 lb 8 oz (95.5 kg)     Height 5' 3" (1.6 m)     Head Circumference      Peak Flow      Pain Score 0     Pain Loc      Pain Edu?      Excl. in GC?    No data found.  Updated Vital Signs BP 128/87 (BP Location: Right Arm)   Pulse 80   Temp 98.9 F (37.2 C) (Oral)   Ht 5' 3" (1.6 m)   Wt 210 lb 8 oz (95.5 kg)   SpO2 96%   BMI 37.29 kg/m   Visual Acuity Right Eye Distance:   Left Eye Distance:   Bilateral Distance:    Right Eye Near:   Left Eye Near:    Bilateral Near:     Physical Exam  Constitutional: [name] is oriented to person, place, and time. [name] appears well-developed and well-nourished. No distress.  HENT:  Head: Normocephalic and atraumatic.  Right Ear: Tympanic membrane normal.  Left Ear: Tympanic membrane normal.  Nose: Nose normal. Right sinus exhibits no maxillary sinus tenderness and no frontal sinus tenderness. Left sinus exhibits no maxillary sinus tenderness and no frontal sinus tenderness.  Mouth/Throat: Uvula is midline, oropharynx is clear and moist and mucous membranes are normal.  Eyes: EOM are normal.  Neck: Normal range of motion. Neck supple.  Cardiovascular: Normal rate and regular rhythm.  Pulmonary/Chest: Effort normal and breath sounds normal. No stridor. No respiratory distress. [name] has no wheezes. [name] has no rales.  Abdominal: Soft. [name]  exhibits no distension. There is no tenderness.  Musculoskeletal: Normal range of motion.  Neurological: [name] is alert and oriented to person, place, and time.  Skin: Skin is warm and dry. [name] is not diaphoretic.  Psychiatric: [name] has a normal mood and affect. Her behavior is normal.  Nursing note and vitals reviewed.    UC Treatments / Results  Labs (all labs ordered are listed, but only abnormal results are displayed) Labs Reviewed - No data to display  EKG None  Radiology No results found.  Procedures Procedures (including critical care time)  Medications Ordered in UC Medications - No data to display  Initial Impression / Assessment and Plan / UC Course  I have reviewed the triage vital signs and the nursing notes.  Pertinent labs & imaging results that were available during my care of the patient were reviewed by me and considered in my medical decision making (see chart for details).     Hx and exam c/w viral illness No evidence of underlying bacterial infection at this time. Will have pt try Virtussin for cough at night. Discussed home care with humidifier and propping herself up at night. Pt info packet provided.  Final Clinical Impressions(s) / UC Diagnoses   Final diagnoses:  Viral URI with cough     Discharge Instructions      Virtussin (guaifenesin-codeine) is a strong narcotic cough medication.  Only take up to 3 times daily as needed for severe cough.  Be sure to take with large glass of water.  It may cause drowsiness. Do not drive, drink alcohol or take other sedating medications such as Nyquil while taking this medication.   Please follow up with your family medicine provider in 4-5 days if not improving, sooner if significantly worsening- chest pain, trouble breathing, persistent fever (>100.4*F).     ED Prescriptions    Medication Sig Dispense Auth. Provider     guaiFENesin-codeine 100-10 MG/5ML syrup Take 10 mLs by mouth 3 (three) times daily  as needed for cough. 118 mL Phelps, Erin O, [name]     Controlled Substance Prescriptions [place] Controlled Substance Registry consulted? Yes, I have consulted the [place] Controlled Substances Registry for this patient, and feel the risk/benefit ratio today is favorable for proceeding with this prescription for a controlled substance.   Phelps, Erin O, [name] 05/06/18 0949  

## 2018-05-06 NOTE — ED Triage Notes (Signed)
Patient c/o non-productive cough x 6 days, no runny nose, afebrile, no ear pain.  Tried OTC Nyquil, [place].

## 2018-05-18 DIAGNOSIS — K080 Exfoliation of teeth due to systemic causes: Secondary | ICD-10-CM | POA: Diagnosis not present

## 2018-05-29 ENCOUNTER — Other Ambulatory Visit: Payer: Self-pay | Admitting: Cardiovascular Disease

## 2018-06-04 ENCOUNTER — Other Ambulatory Visit: Payer: Self-pay | Admitting: Cardiovascular Disease

## 2018-07-04 ENCOUNTER — Other Ambulatory Visit: Payer: Self-pay | Admitting: Cardiovascular Disease

## 2018-08-02 ENCOUNTER — Other Ambulatory Visit: Payer: Self-pay | Admitting: Cardiovascular Disease

## 2018-08-03 NOTE — Telephone Encounter (Signed)
Rx request sent to pharmacy.  

## 2018-08-08 NOTE — Progress Notes (Addendum)
[place] at [facility] [address] [place], [place] [address] [phone]  Date:  08/10/2018   Name:  [name]   DOB:  09/17/1955   MRN:  [id number]  PCP:  Darreld Mclean, MD    Chief Complaint: Rash (behind left ear, off and on, painful to touch, itching, redness using neosporin, tacrolimus ointment, and alclometasone ); Cut in nose (comes and goes); and Blood Work (kidney function test, med refills)   History of Present Illness:  [name] is a 63 y.o. very pleasant female patient who presents with the following:  Here today with a couple of concerns.  History of prediabetes, CAD and NSTEMI, hypertension, hyperlipidemia intolerant to statins hypothyroidism.  I last saw her in September with possible gout in the left ankle.  She did have a high uric acid level at that time  She has noted a scaly, irritated rash behind her right ear for a couple of years.  So far she has tried a few topicals for this, and did see a dermatologist she thinks last February.  They suggested tacrolimus ointment, and also a steroid.  However, these not seem to have been all that helpful.  She has not had a biopsy done yet. The rash continues to be bothersome, she is currently using Neosporin She has been having some eye redness but this got better with stopping a certain mascara.  She has on a new mascara today and so far so good She does wear contacts-the eye issue seems to be on the lid, not the globe itself  Lab Results  Component Value Date   HGBA1C 5.8 04/06/2018   Pap: per her GYN Dr. Lynnda Shields- will do this year   She has been in contact with a dietician but did not really get along with her She is going to the [facility] and exercise on a regular basis She did try TOPS, just went once yesterday.  She was not really impressed with them Patient is frustrated with lack of weight loss, she tries to eat well and is steady with  exercise. We discussed some options, I suggested weight watchers.  She is willing to give this a try Wt Readings from Last 3 Encounters:  08/10/18 214 lb (97.1 kg)  05/06/18 210 lb 8 oz (95.5 kg)  04/06/18 214 lb (97.1 kg)   Her cardiologist is Dr. Alvester Chou, she should be seen for annual visit soon  05/06/2018  1   05/06/2018  Guaiatussin Ac Liquid  118.00 4 Er Phe  [id number]  Cos (4291)  0/0 8.85 MME Private Pay  [place]  05/05/2018  1   11/18/2017  Alprazolam 0.5 Mg Tablet  90.00 30 Je Cop  [id number]  Cos (4291)  2/2 3.00 LME Other  [place]  02/14/2018  1   11/18/2017  Alprazolam 0.5 Mg Tablet  90.00 30 Je Cop  [id number]  Cos (4291)  1/2 3.00 LME Other  [place]  11/18/2017  1   11/18/2017  Alprazolam 0.5 Mg Tablet  90.00 30 Je Cop  [id number]  Cos (4291)  0/2 3.00 LME Other  [place]  08/31/2017  1   04/18/2017  Alprazolam 0.5 Mg Tablet  90.00 30 Je Cop  [id number]  Cos (4291)  2/1      She had minimal decrease in her GFR at most recent labs in September, will recheck for her today  Patient Active Problem List   Diagnosis  Date Noted  . Pre-diabetes 01/14/2017  . 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 Tachycardia, somewhat irregular, episodic 10/08/2013  . Dizziness 10/08/2013  . Angina effort 05/22/2013  . Unstable angina ([place]) 01/22/2013  . Hyperlipidemia 01/01/2013  . CAD (coronary artery disease), possible SCAD 12/14/2012  . NSTEMI (non-ST elevated myocardial infarction) ([place]) 12/10/2012  . Essential hypertension 12/10/2012  . Hypothyroidism 12/10/2012  . Obesity (BMI 35.0-39.9 without comorbidity) 12/10/2012  . Gastroenteritis 10/08/2011  . Acute ischemic colitis ([place]) 08/09/2011  . DYSPHAGIA UNSPECIFIED 07/01/2010  . RECTAL BLEEDING Jan 2013 11/04/2009  . GERD 02/11/2009  . Irritable bowel syndrome 02/11/2009  . PERSONAL HX COLONIC POLYPS 02/11/2009    Past Medical History:  Diagnosis Date  . Angina effort 05/22/2013  . Anxiety   . CAD (coronary artery disease), possible SCAD 12/14/2012  . Colon  polyps   . Duodenitis   . Dyspnea on exertion    LEXISCAN, 09/30/2008 - normal, EKG negative for ischemia, no ECG changes  . Esophageal stricture   . Gastritis   . GERD (gastroesophageal reflux disease)   . Gout    "only from RX given after MI" (01/22/2013)  . H/O hiatal hernia   . History of esophageal stricture   . Hypertension    2D ECHO, 09/30/2008 - EF >55%, normal: RENAL DOPPLER, 03/21/2007 - normal duplex study  . Hypothyroidism   . IBS (irritable bowel syndrome)   . Ischemic colitis ([place])   . Lower GI bleeding    10/07/11 "I've had 3 episodes in the past year"  . Myocardial infarction [facility]) 12/10/2012   "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)  . Tubular adenoma polyp of rectum 2008    Past Surgical History:  Procedure Laterality Date  . ABDOMINAL HYSTERECTOMY  1999  . ANTERIOR LUMBAR FUSION  2010   "L4-5" (01/22/2013)  . BACK SURGERY  09/2008   FUSION/l4-5  . CARDIAC CATHETERIZATION N/A 02/06/2015   Procedure: Left Heart Cath and Coronary Angiography;  Surgeon: Leonie Man, MD;  Location: [place] CV LAB;  Service: Cardiovascular;  Laterality: N/A;  . ESOPHAGEAL DILATION  08/2010  . ESOPHAGEAL DILATION     "once or twice" (01/22/2013)  . HAMMER TOE SURGERY Bilateral ~ 2002  . LEFT HEART CATHETERIZATION WITH CORONARY ANGIOGRAM N/A 12/11/2012   Procedure: LEFT HEART CATHETERIZATION WITH CORONARY ANGIOGRAM;  Surgeon: Leonie Man, MD;  Location: [facility] CATH LAB;  Service: Cardiovascular;  Laterality: N/A;  . LEFT HEART CATHETERIZATION WITH CORONARY ANGIOGRAM N/A 12/15/2012   Procedure: LEFT HEART CATHETERIZATION WITH CORONARY ANGIOGRAM;  Surgeon: Leonie Man, MD;  Location: [facility] CATH LAB;  Service: Cardiovascular;  Laterality: N/A;  . PERIPHERAL VENOUS STUDY  10/03/2008   No evidence of DVT, superficial thrombosis, or Baker's cyst  . TONSILLECTOMY AND ADENOIDECTOMY  1960's  . TUBAL LIGATION  1985    Social History   Tobacco Use  . Smoking status: Never Smoker   . Smokeless tobacco: Never Used  Substance Use Topics  . Alcohol use: No  . Drug use: No    Family History  Problem Relation Age of Onset  . Colon cancer Brother   . Kidney disease Mother   . Stroke Father   . Multiple myeloma Sister   . Heart attack Brother   . Other Brother   . Other Brother     No Known Allergies  Medication list has been reviewed and updated.  Current Outpatient Medications on File Prior to Visit  Medication Sig Dispense Refill  .  amLODipine (NORVASC) 5 MG tablet TAKE ONE TABLET BY MOUTH ONE TIME DAILY  30 tablet 0  . aspirin EC 81 MG EC tablet Take 1 tablet (81 mg total) by mouth daily.    . clopidogrel (PLAVIX) 75 MG tablet TAKE ONE TABLET BY MOUTH ONE TIME DAILY WITH BREAKFAST 30 tablet 4  . guaiFENesin-codeine 100-10 MG/5ML syrup Take 10 mLs by mouth 3 (three) times daily as needed for cough. 118 mL 0  . isosorbide mononitrate (IMDUR) 60 MG 24 hr tablet TAKE 1 TABLET BY MOUTH ONCE DAILY 30 tablet 3  . lisinopril (PRINIVIL,ZESTRIL) 5 MG tablet TAKE ONE TABLET BY MOUTH ONE TIME DAILY  90 tablet 3  . metoprolol tartrate (LOPRESSOR) 25 MG tablet TAKE 1 TABLET BY MOUTH TWICE A DAY 60 tablet 10  . nitroGLYCERIN (NITROSTAT) 0.4 MG SL tablet Place 1 tablet (0.4 mg total) under the tongue every 5 (five) minutes as needed for chest pain. 25 tablet 5  . pantoprazole (PROTONIX) 40 MG tablet Take 1 tablet (40 mg total) by mouth 2 (two) times daily. 90 tablet 3  . RANEXA 1000 MG SR tablet TAKE 1 TABLET BY MOUTH 2 TIMES DAILY. (Patient taking differently: TAKE 1 TABLET BY MOUTH DAILY) 180 tablet 2   No current facility-administered medications on file prior to visit.     Review of Systems:  As per HPI- otherwise negative. No fever or chills, no chest pain or shortness of breath.  No other rashes  Physical Examination: Vitals:   08/10/18 0914  BP: 122/80  Pulse: 78  Resp: 16  Temp: 98.2 F (36.8 C)  SpO2: 98%   Vitals:   08/10/18 0914  Weight: 214 lb  (97.1 kg)  Height: _0  (1.6 m)   Body mass index is 37.91 kg/m. Ideal Body Weight: Weight in (lb) to have BMI = 25: 140.8  GEN: WDWN, NAD, Non-toxic, A & O x 3, obese, looks well  HEENT: Atraumatic, Normocephalic. Neck supple. No masses, No LAD. Ears and Nose: No external deformity. CV: RRR, No M/G/R. No JVD. No thrill. No extra heart sounds. PULM: CTA B, no wheezes, crackles, rhonchi. No retractions. No resp. distress. No accessory muscle use. ABD: S, NT, ND, +BS. No rebound. No HSM. EXTR: No c/c/e NEURO Normal gait.  PSYCH: Normally interactive. Conversant. Not depressed or anxious appearing.  Calm demeanor.    Assessment and Plan: Essential hypertension - Plan: Basic metabolic panel  Screening for hyperlipidemia - Plan: Lipid panel  Post-menopause - Plan: estradiol (ESTRACE) 1 MG tablet  Eczema of external ear, left - Plan: triamcinolone ointment (KENALOG) 0.5 %  Coronary artery disease involving native heart without angina pectoris, unspecified vessel or lesion type  Obesity (BMI 35.0-39.9 without comorbidity)  GAD (generalized anxiety disorder) - Plan: ALPRAZolam (XANAX) 0.5 MG tablet  Following up on a few concerns today.  She does have a prominent rash which appears to be eczema type behind her left ear.  Suggested alternate between a moisturizer such as Aquaphor, and triamcinolone which I prescribed her today.  She will let me know if this does not get better, in that case I suggest following up with her dermatologist. Encourage her to see her GYN soon for mammogram and Pap.  I did give her a temporary refill of her estradiol, but have asked her to discuss continuing this medication with her GYN. Encouraged her to see Dr. Gwenlyn Found soon for a follow-up visit. We discussed her concerns about weight, I encouraged her to try weight  watchers She also requests a refill of her Xanax, reviewed NCCSR and it is okay to refill today  Signed Lamar Blinks, MD  Received her labs  as below, letter to patient  Results for orders placed or performed in visit on 16/10/96  Basic metabolic panel  Result Value Ref Range   Sodium 135 135 - 145 mEq/L   Potassium 4.0 3.5 - 5.1 mEq/L   Chloride 100 96 - 112 mEq/L   CO2 26 19 - 32 mEq/L   Glucose, Bld 93 70 - 99 mg/dL   BUN 17 6 - 23 mg/dL   Creatinine, Ser 1.09 0.40 - 1.20 mg/dL   Calcium 9.8 8.4 - 10.5 mg/dL   GFR 53.99 (L) >60.00 mL/min  Lipid panel  Result Value Ref Range   Cholesterol 269 (H) 0 - 200 mg/dL   Triglycerides 197.0 (H) 0.0 - 149.0 mg/dL   HDL 61.60 >39.00 mg/dL   VLDL 39.4 0.0 - 40.0 mg/dL   LDL Cholesterol 168 (H) 0 - 99 mg/dL   Total CHOL/HDL Ratio 4    NonHDL 207.30    Her cholesterol is not at goal.  Per Dr. Kennon Holter last note, she has been intolerant to statins and they were hoping to start on a medication such as Repatha.  However I do not see where this ended up happening We will asked patient about this

## 2018-08-10 ENCOUNTER — Ambulatory Visit: Payer: Federal, State, Local not specified - PPO | Admitting: Family Medicine

## 2018-08-10 ENCOUNTER — Encounter: Payer: Self-pay | Admitting: Family Medicine

## 2018-08-10 VITALS — BP 122/80 | HR 78 | Temp 98.2°F | Resp 16 | Ht 63.0 in | Wt 214.0 lb

## 2018-08-10 DIAGNOSIS — I10 Essential (primary) hypertension: Secondary | ICD-10-CM

## 2018-08-10 DIAGNOSIS — H60542 Acute eczematoid otitis externa, left ear: Secondary | ICD-10-CM | POA: Diagnosis not present

## 2018-08-10 DIAGNOSIS — E669 Obesity, unspecified: Secondary | ICD-10-CM

## 2018-08-10 DIAGNOSIS — I2510 Atherosclerotic heart disease of native coronary artery without angina pectoris: Secondary | ICD-10-CM

## 2018-08-10 DIAGNOSIS — Z780 Asymptomatic menopausal state: Secondary | ICD-10-CM

## 2018-08-10 DIAGNOSIS — Z13220 Encounter for screening for lipoid disorders: Secondary | ICD-10-CM | POA: Diagnosis not present

## 2018-08-10 DIAGNOSIS — F411 Generalized anxiety disorder: Secondary | ICD-10-CM

## 2018-08-10 LAB — BASIC METABOLIC PANEL
BUN: 17 mg/dL (ref 6–23)
CO2: 26 meq/L (ref 19–32)
Calcium: 9.8 mg/dL (ref 8.4–10.5)
Chloride: 100 mEq/L (ref 96–112)
Creatinine, Ser: 1.09 mg/dL (ref 0.40–1.20)
GFR: 53.99 mL/min — ABNORMAL LOW (ref >60.00)
GLUCOSE: 93 mg/dL (ref 70–99)
POTASSIUM: 4 meq/L (ref 3.5–5.1)
SODIUM: 135 meq/L (ref 135–145)

## 2018-08-10 LAB — LIPID PANEL
CHOL/HDL RATIO: 4
Cholesterol: 269 mg/dL — ABNORMAL HIGH (ref 0–200)
HDL: 61.6 mg/dL (ref >39.00)
LDL Cholesterol: 168 mg/dL — ABNORMAL HIGH (ref 0–99)
NONHDL: 207.3
Triglycerides: 197 mg/dL — ABNORMAL HIGH (ref 0.0–149.0)
VLDL: 39.4 mg/dL (ref 0.0–40.0)

## 2018-08-10 MED ORDER — TRIAMCINOLONE ACETONIDE 0.5 % EX OINT: 1.0000 "application " | TOPICAL_OINTMENT | Freq: Two times a day (BID) | CUTANEOUS | 1 refills | Status: DC

## 2018-08-10 MED ORDER — ESTRADIOL 1 MG PO TABS: 1.0000 mg | ORAL_TABLET | Freq: Every day | ORAL | 4 refills | Status: DC

## 2018-08-10 MED ORDER — ALPRAZOLAM 0.5 MG PO TABS: ORAL_TABLET | ORAL | 2 refills | Status: DC

## 2018-08-10 NOTE — Patient Instructions (Addendum)
It was nice to see you today, I will be in touch with your labs ASAP. It does appear that you have an eczema type rash behind her left ear.  I might try alternate between a thick moisturizer such as either Cetaphil or Aquaphor, and the triamcinolone steroid I gave you today.  You can use the steroid once or twice a day, do not apply to the skin on your face  Please be sure to see your GYN for your mammogram this year, and Pap if needed.  Please asked them about continuing on the estrogen.  I do think weight watchers might be a good option for you, and can assist with weight loss.  It looks like you are coming due for a visit with your cardiologist, Dr. Alvester Chou.  Please reschedule to see him soon

## 2018-09-04 ENCOUNTER — Other Ambulatory Visit: Payer: Self-pay | Admitting: Cardiovascular Disease

## 2018-09-26 ENCOUNTER — Ambulatory Visit: Payer: Federal, State, Local not specified - PPO | Admitting: Cardiovascular Disease

## 2018-09-26 ENCOUNTER — Encounter: Payer: Self-pay | Admitting: Cardiovascular Disease

## 2018-09-26 DIAGNOSIS — I10 Essential (primary) hypertension: Secondary | ICD-10-CM

## 2018-09-26 DIAGNOSIS — I214 Non-ST elevation (NSTEMI) myocardial infarction: Secondary | ICD-10-CM

## 2018-09-26 DIAGNOSIS — E782 Mixed hyperlipidemia: Secondary | ICD-10-CM

## 2018-09-26 NOTE — Assessment & Plan Note (Signed)
History of hyperlipidemia intolerant to statin therapy with LDL recently checked 08/10/2018 of 168.  She did take Repatha briefly but unfortunately it was inconvenient for her to go to the appropriate pharmacy.

## 2018-09-26 NOTE — Patient Instructions (Signed)
Medication Instructions:  Your physician recommends that you continue on your current medications as directed. Please refer to the Current Medication list given to you today.  If you need a refill on your cardiac medications before your next appointment, please call your pharmacy.   Lab work: NONE If you have labs (blood work) drawn today and your tests are completely normal, you will receive your results only by: Marland Kitchen MyChart Message (if you have MyChart) OR . A paper copy in the mail If you have any lab test that is abnormal or we need to change your treatment, we will call you to review the results.  Testing/Procedures: NONE  Follow-Up: At [facility], you and your health needs are our priority.  As part of our continuing mission to provide you with exceptional heart care, we have created designated Provider Care Teams.  These Care Teams include your primary Cardiologist (physician) and Advanced Practice Providers (APPs -  Physician Assistants and Nurse Practitioners) who all work together to provide you with the care you need, when you need it. . You will need a follow up appointment in 12 months.  Please call our office 2 months in advance to schedule this appointment.  You may see Dr. Gwenlyn Found or one of the following Advanced Practice Providers on your designated Care Team:   . Kerin Ransom, [place] . Almyra Deforest, [name] . Fabian Sharp, [name] . Jory Sims, DNP . Rosaria Ferries, [name] . Roby Lofts, [name] . Sande Rives, [name]  Any Other Special Instructions Will Be Listed Below (If Applicable). REAUTHORIZATION FOR REPATHA TO BE COMPLETED BY CLINICAL PHARMACIST

## 2018-09-26 NOTE — Assessment & Plan Note (Signed)
History of essential hypertension her blood pressure measured today at 122/80.  She is on amlodipine and metoprolol as well as lisinopril.

## 2018-09-26 NOTE — Progress Notes (Signed)
09/26/2018 [name]   11/04/1955  [id number]  Primary Physician Copland, Gay Filler, MD Primary Cardiologist: Lorretta Harp MD Garret Reddish, Alex, [place]  HPI:  [name] is a 63 y.o.  who I last saw December of last year with a history of HTN, obesity, and hypothyroidism . I last saw her in the office  08/10/2017.She presented on 12/11/12 after a prolonged episode of angina. Her EKG was without acute changes, but she ruled in for NSTEMI. Her troponin peaked at 4.95. She was placed on IV Heparin and underwent diagnostic coronary angiography. Her initial cardiac catheterization on 5/11, performed by Dr. Ellyn Hack, demonstrated diffuse LAD disease with essentially subtotal occlusion that had the appearance of possible Spontaneous Coronary Artery Dissection. However, with existing CAD, simply diffuse disease was also possible. It was decided to not do any intervention at that time, but to wait and have the patient return, several days later, for a re-look cath to re-evaluate the LAD. She was maintained on medical therapy and had improvement in symtpoms. She returned to the cath lab on 5/16 for re-look. This was also performed by Dr. Ellyn Hack. She was noted to have progression of LAD disease to total occlusion at the original ~95% subtotal location with improved D1-distal LAD and R-L collaterals (septal and distal RPDA). The images were reviewd by Dr. Gwenlyn Found and Dr. Lia Foyer and it was concluded that the best course of action was not to proceed with extensive LAD PTCA-PCI, in the absence of on-going symptoms. Medical therapy was recommended. Since I saw her a year ago she's been doing well until this past July when she was admitted with unstable angina. She ruled out for myocardial infarction. She underwent cardiac catheterization by Dr. Ellyn Hack revealing an occluded LAD in the midportion and otherwise minimal CAD. She did have a 90% small ostial stent second marginal branch stenosis and 60%  PLA stenosis with what appeared to be fairly well preserved LV function. She had an apical wall motion abnormality. Since I saw her a year ago she saw remained currently stable specifically denying chest pain or shortness of breath. Her major complaint is of chronic fatigue and lack of energy.She retired from working as a [employer] on 08/01/17 and seems much happier.  Since I saw her a year ago she is remained stable.  She has come down with flulike symptoms of the last several days.  She denies chest pain.  Her LDL is 168 performed 08/10/2018.  She did take 2 doses of Repatha but ultimately decided not to pursue this because of inconvenience.   Current Meds  Medication Sig  . ALPRAZolam (XANAX) 0.5 MG tablet TAKE 1 TABLET BY MOUTH 3 TIMES DAILY AS NEEDED FOR ANXIETY  . amLODipine (NORVASC) 5 MG tablet Take 1 tablet (5 mg total) by mouth daily. NEED OV.  Marland Kitchen aspirin EC 81 MG EC tablet Take 1 tablet (81 mg total) by mouth daily.  . clopidogrel (PLAVIX) 75 MG tablet TAKE ONE TABLET BY MOUTH ONE TIME DAILY WITH BREAKFAST  . estradiol (ESTRACE) 1 MG tablet Take 1 tablet (1 mg total) by mouth daily.  Marland Kitchen guaiFENesin-codeine 100-10 MG/5ML syrup Take 10 mLs by mouth 3 (three) times daily as needed for cough.  . isosorbide mononitrate (IMDUR) 60 MG 24 hr tablet TAKE 1 TABLET BY MOUTH ONCE DAILY  . lisinopril (PRINIVIL,ZESTRIL) 5 MG tablet TAKE ONE TABLET BY MOUTH ONE TIME DAILY   . metoprolol tartrate (LOPRESSOR) 25 MG tablet Take 1  tablet (25 mg total) by mouth 2 (two) times daily. NEED OV.  . nitroGLYCERIN (NITROSTAT) 0.4 MG SL tablet Place 1 tablet (0.4 mg total) under the tongue every 5 (five) minutes as needed for chest pain.  . pantoprazole (PROTONIX) 40 MG tablet Take 1 tablet (40 mg total) by mouth 2 (two) times daily.  Marland Kitchen RANEXA 1000 MG SR tablet TAKE 1 TABLET BY MOUTH 2 TIMES DAILY. (Patient taking differently: TAKE 1 TABLET BY MOUTH DAILY)  . triamcinolone ointment (KENALOG) 0.5 % Apply 1 application  topically 2 (two) times daily. Use for eczema behind ear as needed     No Known Allergies  Social History   Socioeconomic History  . Marital status: Single    Spouse name: Not on file  . Number of children: 2  . Years of education: Not on file  . Highest education level: Not on file  Occupational History  . Occupation: Drug enforcement    CommentPassenger transport manager  Social Needs  . Financial resource strain: Not on file  . Food insecurity:    Worry: Not on file    Inability: Not on file  . Transportation needs:    Medical: Not on file    Non-medical: Not on file  Tobacco Use  . Smoking status: Never Smoker  . Smokeless tobacco: Never Used  Substance and Sexual Activity  . Alcohol use: No  . Drug use: No  . Sexual activity: Never  Lifestyle  . Physical activity:    Days per week: Not on file    Minutes per session: Not on file  . Stress: Not on file  Relationships  . Social connections:    Talks on phone: Not on file    Gets together: Not on file    Attends religious service: Not on file    Active member of club or organization: Not on file    Attends meetings of clubs or organizations: Not on file    Relationship status: Not on file  . Intimate partner violence:    Fear of current or ex partner: Not on file    Emotionally abused: Not on file    Physically abused: Not on file    Forced sexual activity: Not on file  Other Topics Concern  . Not on file  Social History Narrative   Daily caffeine use.     Review of Systems: General: negative for chills, fever, night sweats or weight changes.  Cardiovascular: negative for chest pain, dyspnea on exertion, edema, orthopnea, palpitations, paroxysmal nocturnal dyspnea or shortness of breath Dermatological: negative for rash Respiratory: negative for cough or wheezing Urologic: negative for hematuria Abdominal: negative for nausea, vomiting, diarrhea, bright red blood per rectum, melena, or hematemesis Neurologic:  negative for visual changes, syncope, or dizziness All other systems reviewed and are otherwise negative except as noted above.    Blood pressure 138/88, pulse 76, height 5\' 3"  (1.6 m), weight 210 lb (95.3 kg).  General appearance: alert and no distress Neck: no adenopathy, no carotid bruit, no JVD, supple, symmetrical, trachea midline and thyroid not enlarged, symmetric, no tenderness/mass/nodules Lungs: clear to auscultation bilaterally Heart: regular rate and rhythm, S1, S2 normal, no murmur, click, rub or gallop Extremities: extremities normal, atraumatic, no cyanosis or edema Pulses: 2+ and symmetric Skin: Skin color, texture, turgor normal. No rashes or lesions Neurologic: Alert and oriented X 3, normal strength and tone. Normal symmetric reflexes. Normal coordination and gait  EKG sinus rhythm at 76 without ST or T  wave changes. I  Personally reviewed this EKG.  ASSESSMENT AND PLAN:   Essential hypertension History of essential hypertension her blood pressure measured today at 122/80.  She is on amlodipine and metoprolol as well as lisinopril.  Hyperlipidemia History of hyperlipidemia intolerant to statin therapy with LDL recently checked 08/10/2018 of 168.  She did take Repatha briefly but unfortunately it was inconvenient for her to go to the appropriate pharmacy.  NSTEMI (non-ST elevated myocardial infarction) ([place]) History of CAD status post possible S CAD of her LAD 5/11.  She returned to the Cath Lab 5/16 for relook Perform[facility] Dr. Ellyn Hack which showed progression of her LAD with total occlusion with right to left collaterals.  After extensive discussion it was decided to treat her medically.  Since I saw her a year ago she is remained stable and denies chest pain or shortness of breath.  Her LV function has remained normal as well.      Lorretta Harp MD FACP,FACC,FAHA, [facility] 09/26/2018 2:29 PM

## 2018-09-26 NOTE — Assessment & Plan Note (Signed)
History of CAD status post possible S CAD of her LAD 5/11.  She returned to the Cath Lab 5/16 for relook Perform[facility] Dr. Ellyn Hack which showed progression of her LAD with total occlusion with right to left collaterals.  After extensive discussion it was decided to treat her medically.  Since I saw her a year ago she is remained stable and denies chest pain or shortness of breath.  Her LV function has remained normal as well.

## 2018-09-27 ENCOUNTER — Ambulatory Visit: Payer: Federal, State, Local not specified - PPO | Admitting: Family Medicine

## 2018-09-27 ENCOUNTER — Encounter: Payer: Self-pay | Admitting: Family Medicine

## 2018-09-27 VITALS — BP 114/72 | HR 90 | Temp 99.2°F | Ht 63.0 in | Wt 210.0 lb

## 2018-09-27 DIAGNOSIS — R509 Fever, unspecified: Secondary | ICD-10-CM | POA: Diagnosis not present

## 2018-09-27 DIAGNOSIS — R5381 Other malaise: Secondary | ICD-10-CM | POA: Diagnosis not present

## 2018-09-27 DIAGNOSIS — J111 Influenza due to unidentified influenza virus with other respiratory manifestations: Secondary | ICD-10-CM

## 2018-09-27 DIAGNOSIS — R3129 Other microscopic hematuria: Secondary | ICD-10-CM

## 2018-09-27 LAB — POCT URINALYSIS DIP (MANUAL ENTRY)
BILIRUBIN UA: NEGATIVE mg/dL
Glucose, UA: NEGATIVE mg/dL
Leukocytes, UA: NEGATIVE
Nitrite, UA: NEGATIVE
Spec Grav, UA: 1.015 (ref 1.010–1.025)
Urobilinogen, UA: 0.2 E.U./dL
pH, UA: 5.5 (ref 5.0–8.0)

## 2018-09-27 LAB — POCT INFLUENZA A/B
INFLUENZA B, POC: NEGATIVE
Influenza A, POC: POSITIVE — AB

## 2018-09-27 LAB — URINALYSIS, MICROSCOPIC ONLY

## 2018-09-27 MED ORDER — OSELTAMIVIR PHOSPHATE 75 MG PO CAPS: 75.0000 mg | ORAL_CAPSULE | Freq: Two times a day (BID) | ORAL | 0 refills | Status: DC

## 2018-09-27 NOTE — Patient Instructions (Addendum)
You do have the flu- take the tamiflu twice a day for 10 days Rest, fluids, OTC meds as needed I am going to send your urine in for further testing  Please seek care if you are not doing ok! Don't forget your mammogram asap

## 2018-09-27 NOTE — Progress Notes (Addendum)
[place] at [facility] [address], [place], [place] [address] [phone]  Date:  09/27/2018   Name:  [name]   DOB:  [birth year]   MRN:  [id number]  PCP:  Darreld Mclean, MD    Chief Complaint: Cough (Pt stated aching body, low grade fever--4 days)   History of Present Illness:  [name] is a 63 y.o. very pleasant female patient who presents with the following:  Here today for sick visit.  History of an STEMI, CAD, GERD, hypertension She actually saw her cardiologist, Dr. Gwenlyn Found, just yesterday she has CAD that was noted on cath in 2016, which is being treated medically. She is doing well in this regard, there continue her medications as below  Alprazolam as needed Amlodipine 5 Aspirin 81 Plavix Estrace 1 mg by mouth daily Imdur Lisinopril 5 Metoprolol 25 twice daily Nitro Protonix Ranexa  Today is Wednesday- on Sunday she noted aches and fatigue Over the weekend she kept her granddaughter, who is been diagnosed with we think the flu She has now noted chills Did not check her temp but felt feverish Some cough She also notes a ST Her back is aching, no urinary frequency or pain however  She did take some OTC cold med- likely has tylenol in it, but she is not sure  No vomiting or diarrhea appetite is ok   Reminded about mammogram She is s/p hyst- however she does still get paps per her GYN office   Patient Active Problem List   Diagnosis Date Noted  . Pre-diabetes 01/14/2017  . 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 Tachycardia, somewhat irregular, episodic 10/08/2013  . Dizziness 10/08/2013  . Angina effort 05/22/2013  . Unstable angina ([place]) 01/22/2013  . Hyperlipidemia 01/01/2013  . CAD (coronary artery disease), possible SCAD 12/14/2012  . NSTEMI (non-ST elevated myocardial infarction) ([place]) 12/10/2012  . Essential hypertension 12/10/2012  . Hypothyroidism 12/10/2012  .  Obesity (BMI 35.0-39.9 without comorbidity) 12/10/2012  . Gastroenteritis 10/08/2011  . Acute ischemic colitis ([place]) 08/09/2011  . DYSPHAGIA UNSPECIFIED 07/01/2010  . RECTAL BLEEDING Jan 2013 11/04/2009  . GERD 02/11/2009  . Irritable bowel syndrome 02/11/2009  . PERSONAL HX COLONIC POLYPS 02/11/2009    Past Medical History:  Diagnosis Date  . Angina effort 05/22/2013  . Anxiety   . CAD (coronary artery disease), possible SCAD 12/14/2012  . Colon polyps   . Duodenitis   . Dyspnea on exertion    LEXISCAN, 09/30/2008 - normal, EKG negative for ischemia, no ECG changes  . Esophageal stricture   . Gastritis   . GERD (gastroesophageal reflux disease)   . Gout    "only from RX given after MI" (01/22/2013)  . H/O hiatal hernia   . History of esophageal stricture   . Hypertension    2D ECHO, 09/30/2008 - EF >55%, normal: RENAL DOPPLER, 03/21/2007 - normal duplex study  . Hypothyroidism   . IBS (irritable bowel syndrome)   . Ischemic colitis ([place])   . Lower GI bleeding    10/07/11 "I've had 3 episodes in the past year"  . Myocardial infarction [facility]) 12/10/2012   "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)  . Tubular adenoma polyp of rectum 2008    Past Surgical History:  Procedure Laterality Date  . ABDOMINAL HYSTERECTOMY  1999  . ANTERIOR LUMBAR FUSION  2010   "L4-5" (01/22/2013)  . BACK SURGERY  09/2008   FUSION/l4-5  . CARDIAC  CATHETERIZATION N/A 02/06/2015   Procedure: Left Heart Cath and Coronary Angiography;  Surgeon: Leonie Man, MD;  Location: [place] CV LAB;  Service: Cardiovascular;  Laterality: N/A;  . ESOPHAGEAL DILATION  08/2010  . ESOPHAGEAL DILATION     "once or twice" (01/22/2013)  . HAMMER TOE SURGERY Bilateral ~ 2002  . LEFT HEART CATHETERIZATION WITH CORONARY ANGIOGRAM N/A 12/11/2012   Procedure: LEFT HEART CATHETERIZATION WITH CORONARY ANGIOGRAM;  Surgeon: Leonie Man, MD;  Location: [facility] CATH LAB;  Service: Cardiovascular;  Laterality: N/A;  . LEFT  HEART CATHETERIZATION WITH CORONARY ANGIOGRAM N/A 12/15/2012   Procedure: LEFT HEART CATHETERIZATION WITH CORONARY ANGIOGRAM;  Surgeon: Leonie Man, MD;  Location: [facility] CATH LAB;  Service: Cardiovascular;  Laterality: N/A;  . PERIPHERAL VENOUS STUDY  10/03/2008   No evidence of DVT, superficial thrombosis, or Baker's cyst  . TONSILLECTOMY AND ADENOIDECTOMY  1960's  . TUBAL LIGATION  1985    Social History   Tobacco Use  . Smoking status: Never Smoker  . Smokeless tobacco: Never Used  Substance Use Topics  . Alcohol use: No  . Drug use: No    Family History  Problem Relation Age of Onset  . Colon cancer Brother   . Kidney disease Mother   . Stroke Father   . Multiple myeloma Sister   . Heart attack Brother   . Other Brother   . Other Brother     No Known Allergies  Medication list has been reviewed and updated.  Current Outpatient Medications on File Prior to Visit  Medication Sig Dispense Refill  . ALPRAZolam (XANAX) 0.5 MG tablet TAKE 1 TABLET BY MOUTH 3 TIMES DAILY AS NEEDED FOR ANXIETY 90 tablet 2  . amLODipine (NORVASC) 5 MG tablet Take 1 tablet (5 mg total) by mouth daily. NEED OV. 30 tablet 0  . aspirin EC 81 MG EC tablet Take 1 tablet (81 mg total) by mouth daily.    . clopidogrel (PLAVIX) 75 MG tablet TAKE ONE TABLET BY MOUTH ONE TIME DAILY WITH BREAKFAST 30 tablet 4  . estradiol (ESTRACE) 1 MG tablet Take 1 tablet (1 mg total) by mouth daily. 30 tablet 4  . guaiFENesin-codeine 100-10 MG/5ML syrup Take 10 mLs by mouth 3 (three) times daily as needed for cough. 118 mL 0  . isosorbide mononitrate (IMDUR) 60 MG 24 hr tablet TAKE 1 TABLET BY MOUTH ONCE DAILY 30 tablet 3  . lisinopril (PRINIVIL,ZESTRIL) 5 MG tablet TAKE ONE TABLET BY MOUTH ONE TIME DAILY  90 tablet 3  . metoprolol tartrate (LOPRESSOR) 25 MG tablet Take 1 tablet (25 mg total) by mouth 2 (two) times daily. NEED OV. 60 tablet 0  . nitroGLYCERIN (NITROSTAT) 0.4 MG SL tablet Place 1 tablet (0.4 mg total)  under the tongue every 5 (five) minutes as needed for chest pain. 25 tablet 5  . pantoprazole (PROTONIX) 40 MG tablet Take 1 tablet (40 mg total) by mouth 2 (two) times daily. 90 tablet 3  . RANEXA 1000 MG SR tablet TAKE 1 TABLET BY MOUTH 2 TIMES DAILY. (Patient taking differently: TAKE 1 TABLET BY MOUTH DAILY) 180 tablet 2  . triamcinolone ointment (KENALOG) 0.5 % Apply 1 application topically 2 (two) times daily. Use for eczema behind ear as needed 30 g 1   No current facility-administered medications on file prior to visit.     Review of Systems:  As per HPI- otherwise negative. No rash, no vomiting or diarrhea  Physical Examination: Vitals:  09/27/18 0853  BP: 114/72  Pulse: 90  Temp: 99.2 F (37.3 C)  SpO2: 93%   Vitals:   09/27/18 0853  Weight: 210 lb (95.3 kg)  Height: 5' 3" (1.6 m)   Body mass index is 37.2 kg/m. Ideal Body Weight: Weight in (lb) to have BMI = 25: 140.8  GEN: WDWN, NAD, Non-toxic, A & O x 3, obese, does not appear ill but also does not appear to feel great HEENT: Atraumatic, Normocephalic. Neck supple. No masses, No LAD.  Bilateral TM wnl, oropharynx normal.  PEERL,EOMI.   Ears and Nose: No external deformity. CV: RRR, No M/G/R. No JVD. No thrill. No extra heart sounds. PULM: CTA B, no wheezes, crackles, rhonchi. No retractions. No resp. distress. No accessory muscle use. ABD: S, NT, ND. No rebound. No HSM. EXTR: No c/c/e NEURO Normal gait.  PSYCH: Normally interactive. Conversant. Not depressed or anxious appearing.  Calm demeanor.   Results for orders placed or performed in visit on 09/27/18  Urine Microscopic Only  Result Value Ref Range   WBC, UA 0-2/hpf 0-2/hpf   RBC / HPF 0-2/hpf 0-2/hpf   Mucus, UA Presence of (A) None   Squamous Epithelial / LPF Rare(0-4/hpf) Rare(0-4/hpf)   Bacteria, UA Rare(<10/hpf) (A) None   Uric Acid Crys, UA Presence of (A) None  POCT Influenza A/B  Result Value Ref Range   Influenza A, POC Positive (A)  Negative   Influenza B, POC Negative Negative  POCT urinalysis dipstick  Result Value Ref Range   Color, UA yellow yellow   Clarity, UA clear clear   Glucose, UA negative negative mg/dL   Bilirubin, UA small (A) negative   Ketones, POC UA negative negative mg/dL   Spec Grav, UA 1.015 1.010 - 1.025   Blood, UA small (A) negative   pH, UA 5.5 5.0 - 8.0   Protein Ur, POC trace (A) negative mg/dL   Urobilinogen, UA 0.2 0.2 or 1.0 E.U./dL   Nitrite, UA Negative Negative   Leukocytes, UA Negative Negative   BP Readings from Last 3 Encounters:  09/27/18 114/72  09/26/18 138/88  08/10/18 122/80     Assessment and Plan: Influenza - Plan: oseltamivir (TAMIFLU) 75 MG capsule  Malaise - Plan: POCT Influenza A/B, POCT urinalysis dipstick  Low grade fever - Plan: POCT Influenza A/B, POCT urinalysis dipstick  Microhematuria - Plan: Urine Culture, Urine Microscopic Only  Here today with influenza.  She is technically outside the window, but would like to use Tamiflu.  I do not think this will be harmful. Check for drug interactions, Plavix may decrease the efficacy of her Tamiflu but it will not increase her risk for bleeding or blood clot. Also noted microhematuria.  Patient notes that this has been a long-term issue, Dr. Nori Riis has mentioned it to her.  I will check a microscope exam and culture  I have advised patient that the flu can sinus be dangerous.  If she is getting worse, or not feeling better, I advised her to seek care.  Signed Lamar Blinks, MD Addendum 2/27.  Received her urine micro 0-2 red cells, in normal range Still await culture  Addendum 2/27 Urine culture received  Results for orders placed or performed in visit on 09/27/18  Urine Culture  Result Value Ref Range   MICRO NUMBER: [id number]    SPECIMEN QUALITY: Adequate    Sample Source NOT GIVEN    STATUS: FINAL    ISOLATE 1:      Multiple organisms  present, each less than 10,000 CFU/mL. These organisms,  commonly found on external and internal genitalia, are considered to be colonizers. No further testing performed.  Urine Microscopic Only  Result Value Ref Range   WBC, UA 0-2/hpf 0-2/hpf   RBC / HPF 0-2/hpf 0-2/hpf   Mucus, UA Presence of (A) None   Squamous Epithelial / LPF Rare(0-4/hpf) Rare(0-4/hpf)   Bacteria, UA Rare(<10/hpf) (A) None   Uric Acid Crys, UA Presence of (A) None  POCT Influenza A/B  Result Value Ref Range   Influenza A, POC Positive (A) Negative   Influenza B, POC Negative Negative  POCT urinalysis dipstick  Result Value Ref Range   Color, UA yellow yellow   Clarity, UA clear clear   Glucose, UA negative negative mg/dL   Bilirubin, UA small (A) negative   Ketones, POC UA negative negative mg/dL   Spec Grav, UA 1.015 1.010 - 1.025   Blood, UA small (A) negative   pH, UA 5.5 5.0 - 8.0   Protein Ur, POC trace (A) negative mg/dL   Urobilinogen, UA 0.2 0.2 or 1.0 E.U./dL   Nitrite, UA Negative Negative   Leukocytes, UA Negative Negative   Letter to patient with lab results

## 2018-09-28 LAB — URINE CULTURE
MICRO NUMBER:: 245520
SPECIMEN QUALITY:: ADEQUATE

## 2018-10-04 ENCOUNTER — Telehealth: Payer: Self-pay

## 2018-10-04 MED ORDER — EVOLOCUMAB 140 MG/ML ~~LOC~~ SOAJ: 140.0000 mg | SUBCUTANEOUS | 6 refills | Status: DC

## 2018-10-04 NOTE — Telephone Encounter (Signed)
Called to let the pt know that the rx was sent in and the pa approved and that if the med is to costly call us back so that we can get some kind of assistance in the works for her

## 2018-10-05 ENCOUNTER — Other Ambulatory Visit: Payer: Self-pay | Admitting: Cardiovascular Disease

## 2018-10-17 ENCOUNTER — Other Ambulatory Visit: Payer: Self-pay | Admitting: Cardiovascular Disease

## 2018-10-23 ENCOUNTER — Telehealth: Payer: Self-pay | Admitting: Cardiovascular Disease

## 2018-10-23 ENCOUNTER — Other Ambulatory Visit: Payer: Self-pay | Admitting: Cardiovascular Disease

## 2018-10-23 NOTE — Telephone Encounter (Signed)
  Patient is calling because she needs her lisinopril (PRINIVIL,ZESTRIL) 5 MG tablet prior auth so that she can get it refilled.

## 2018-10-24 NOTE — Telephone Encounter (Signed)
Refill sent to the pharmacy electronically.  

## 2018-11-01 ENCOUNTER — Other Ambulatory Visit: Payer: Self-pay

## 2018-11-01 ENCOUNTER — Other Ambulatory Visit (INDEPENDENT_AMBULATORY_CARE_PROVIDER_SITE_OTHER): Payer: Federal, State, Local not specified - PPO

## 2018-11-01 ENCOUNTER — Encounter: Payer: Self-pay | Admitting: Family Medicine

## 2018-11-01 ENCOUNTER — Telehealth: Payer: Self-pay | Admitting: Cardiovascular Disease

## 2018-11-01 ENCOUNTER — Encounter: Payer: Self-pay | Admitting: *Deleted

## 2018-11-01 ENCOUNTER — Ambulatory Visit: Payer: Self-pay | Admitting: *Deleted

## 2018-11-01 ENCOUNTER — Ambulatory Visit (INDEPENDENT_AMBULATORY_CARE_PROVIDER_SITE_OTHER): Payer: Federal, State, Local not specified - PPO | Admitting: Family Medicine

## 2018-11-01 DIAGNOSIS — R5383 Other fatigue: Secondary | ICD-10-CM

## 2018-11-01 DIAGNOSIS — R197 Diarrhea, unspecified: Secondary | ICD-10-CM

## 2018-11-01 LAB — CBC WITH DIFFERENTIAL/PLATELET
Basophils Absolute: 0.1 10*3/uL (ref 0.0–0.1)
Basophils Relative: 0.8 % (ref 0.0–3.0)
Eosinophils Absolute: 0.1 10*3/uL (ref 0.0–0.7)
Eosinophils Relative: 1.6 % (ref 0.0–5.0)
HCT: 36.4 % (ref 36.0–46.0)
Hemoglobin: 12.4 g/dL (ref 12.0–15.0)
Lymphocytes Relative: 24.8 % (ref 12.0–46.0)
Lymphs Abs: 1.7 10*3/uL (ref 0.7–4.0)
MCHC: 34.2 g/dL (ref 30.0–36.0)
MCV: 92.1 fl (ref 78.0–100.0)
Monocytes Absolute: 0.6 10*3/uL (ref 0.1–1.0)
Monocytes Relative: 8.4 % (ref 3.0–12.0)
Neutro Abs: 4.5 10*3/uL (ref 1.4–7.7)
Neutrophils Relative %: 64.4 % (ref 43.0–77.0)
Platelets: 271 10*3/uL (ref 150.0–400.0)
RBC: 3.95 Mil/uL (ref 3.87–5.11)
RDW: 13.3 % (ref 11.5–15.5)
WBC: 6.9 10*3/uL (ref 4.0–10.5)

## 2018-11-01 LAB — COMPREHENSIVE METABOLIC PANEL
ALT: 9 U/L (ref 0–35)
AST: 12 U/L (ref 0–37)
Albumin: 3.6 g/dL (ref 3.5–5.2)
Alkaline Phosphatase: 73 U/L (ref 39–117)
BUN: 16 mg/dL (ref 6–23)
CO2: 27 mEq/L (ref 19–32)
Calcium: 9.2 mg/dL (ref 8.4–10.5)
Chloride: 102 mEq/L (ref 96–112)
Creatinine, Ser: 1.15 mg/dL (ref 0.40–1.20)
GFR: 47.72 mL/min — ABNORMAL LOW (ref >60.00)
Glucose, Bld: 109 mg/dL — ABNORMAL HIGH (ref 70–99)
Potassium: 3.9 mEq/L (ref 3.5–5.1)
Sodium: 136 mEq/L (ref 135–145)
Total Bilirubin: 0.3 mg/dL (ref 0.2–1.2)
Total Protein: 6.6 g/dL (ref 6.0–8.3)

## 2018-11-01 LAB — TSH: TSH: 3.38 u[IU]/mL (ref 0.35–4.50)

## 2018-11-01 NOTE — Telephone Encounter (Signed)
Scheduled patient for webex appointment this afternoon.

## 2018-11-01 NOTE — Telephone Encounter (Signed)
° ° °  Taili from [employer] calling for additional information,regarding Repatha prior auth  Call [phone] Use Key: [id number]

## 2018-11-01 NOTE — Telephone Encounter (Signed)
Message from Scherrie Gerlach sent at 11/01/2018 7:11 AM EDT   Summary: advice   Pt states her temp has been 97.1 to 97.3 and she cannot get it any higher. A little bit of diarrhea for bout 3 days. Wants to knw if she should be concerned or see the dr. (pt aware web ex only). Pt states she's just not feeling well.         Returned call to patient regarding her symptoms. She has had diarrhea and normally takes imodium for it and usually gone away. But it has not worked this time. Also has some abd cramping. She also has been checking her temp and it was 97.2 but this morning 96.7.  Using a oral thermometer.  Had a headache yesterday. Has moments of fatigue. No cough, shortness of breath or otherwise pain. She is concern about her temp and feeling blah. Requesting an appointment or recommendations to help her. Routing to flow at [facility] at [facility] for call back to patient. Pt made aware of possible virtual visit and voiced understanding. No protocol

## 2018-11-01 NOTE — Progress Notes (Addendum)
[place] at [facility] [address] [place], [place] [address] [phone]  Date:  11/01/2018   Name:  SHANTE ARCHAMBEAULT   DOB:  [birth year]   MRN:  [id number]  PCP:  Darreld Mclean, MD    Chief Complaint: No chief complaint on file.   History of Present Illness:  [name] is a 63 y.o. very pleasant female patient who presents with the following:  Web visit today due to COVID 19 pandemic Pt ID confirmed by name and DOB Patient with cardiac history including STEMI and CAD with most recent cath in 2016, hyperlipidemia, hypothyroidism.  I saw her most recently in February when she was diagnosed with influenza A Most recent visit with her cardiologist about 5 weeks ago, at that time her blood pressure was under control, they are working on getting her set up with Repatha but this is still pending insurnace approval  Pt called in with the following concern: Returned call to patient regarding her symptoms. She has had diarrhea and normally takes imodium for it and usually gone away. But it has not worked this time. Also has some abd cramping. She also has been checking her temp and it was 97.2 but this morning 96.7.  Using a oral thermometer.  Had a headache yesterday. Has moments of fatigue. No cough, shortness of breath or otherwise pain. She is concern about her temp and feeling blah. Requesting an appointment or recommendations to help her. Routing to flow at [facility] at [facility] for call back to patient. Pt made aware of possible virtual visit and voiced understanding.  Lab Results  Component Value Date   TSH 3.02 01/12/2017   She has had diarrhea since Sunday- today is Wednesday She might have 3-4 BM daily - more loose stools than watery She notes that her temp has been low, as low as 96.85F.  She notes that she is "always cold," but this seemed lower than normal to her  She has felt tired, and has  noted a HA- she thought this was due to allergies but was not sure No vomiting She is eating normally She may have some cramping prior to BM but then she feels better-otherwise no abdominal pain No blood in her BM  She was taken off thyroid replacement some time ago as it was no longer needed  No cough or SOB  Alprazolam as needed Amlodipine Aspirin 81 Plavix Estrace 1 mg Repatha-still pending Imdur Lisinopril 5 mg Metoprolol 25 twice daily Nitroglycerin as needed Protonix Ranexa  Patient Active Problem List   Diagnosis Date Noted  . Pre-diabetes 01/14/2017  . 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 Tachycardia, somewhat irregular, episodic 10/08/2013  . Dizziness 10/08/2013  . Angina effort 05/22/2013  . Unstable angina ([place]) 01/22/2013  . Hyperlipidemia 01/01/2013  . CAD (coronary artery disease), possible SCAD 12/14/2012  . NSTEMI (non-ST elevated myocardial infarction) ([place]) 12/10/2012  . Essential hypertension 12/10/2012  . Hypothyroidism 12/10/2012  . Obesity (BMI 35.0-39.9 without comorbidity) 12/10/2012  . Gastroenteritis 10/08/2011  . Acute ischemic colitis ([place]) 08/09/2011  . DYSPHAGIA UNSPECIFIED 07/01/2010  . RECTAL BLEEDING Jan 2013 11/04/2009  . GERD 02/11/2009  . Irritable bowel syndrome 02/11/2009  . PERSONAL HX COLONIC POLYPS 02/11/2009    Past Medical History:  Diagnosis Date  . Angina effort 05/22/2013  . Anxiety   . CAD (coronary artery disease), possible SCAD 12/14/2012  . Colon polyps   .  Duodenitis   . Dyspnea on exertion    LEXISCAN, 09/30/2008 - normal, EKG negative for ischemia, no ECG changes  . Esophageal stricture   . Gastritis   . GERD (gastroesophageal reflux disease)   . Gout    "only from RX given after MI" (01/22/2013)  . H/O hiatal hernia   . History of esophageal stricture   . Hypertension    2D ECHO, 09/30/2008 - EF >55%, normal: RENAL DOPPLER, 03/21/2007 - normal duplex study  . Hypothyroidism   . IBS (irritable bowel  syndrome)   . Ischemic colitis ([place])   . Lower GI bleeding    10/07/11 "I've had 3 episodes in the past year"  . Myocardial infarction [facility]) 12/10/2012   "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)  . Tubular adenoma polyp of rectum 2008    Past Surgical History:  Procedure Laterality Date  . ABDOMINAL HYSTERECTOMY  1999  . ANTERIOR LUMBAR FUSION  2010   "L4-5" (01/22/2013)  . BACK SURGERY  09/2008   FUSION/l4-5  . CARDIAC CATHETERIZATION N/A 02/06/2015   Procedure: Left Heart Cath and Coronary Angiography;  Surgeon: Leonie Man, MD;  Location: [place] CV LAB;  Service: Cardiovascular;  Laterality: N/A;  . ESOPHAGEAL DILATION  08/2010  . ESOPHAGEAL DILATION     "once or twice" (01/22/2013)  . HAMMER TOE SURGERY Bilateral ~ 2002  . LEFT HEART CATHETERIZATION WITH CORONARY ANGIOGRAM N/A 12/11/2012   Procedure: LEFT HEART CATHETERIZATION WITH CORONARY ANGIOGRAM;  Surgeon: Leonie Man, MD;  Location: [facility] CATH LAB;  Service: Cardiovascular;  Laterality: N/A;  . LEFT HEART CATHETERIZATION WITH CORONARY ANGIOGRAM N/A 12/15/2012   Procedure: LEFT HEART CATHETERIZATION WITH CORONARY ANGIOGRAM;  Surgeon: Leonie Man, MD;  Location: [facility] CATH LAB;  Service: Cardiovascular;  Laterality: N/A;  . PERIPHERAL VENOUS STUDY  10/03/2008   No evidence of DVT, superficial thrombosis, or Baker's cyst  . TONSILLECTOMY AND ADENOIDECTOMY  1960's  . TUBAL LIGATION  1985    Social History   Tobacco Use  . Smoking status: Never Smoker  . Smokeless tobacco: Never Used  Substance Use Topics  . Alcohol use: No  . Drug use: No    Family History  Problem Relation Age of Onset  . Colon cancer Brother   . Kidney disease Mother   . Stroke Father   . Multiple myeloma Sister   . Heart attack Brother   . Other Brother   . Other Brother     No Known Allergies  Medication list has been reviewed and updated.  Current Outpatient Medications on File Prior to Visit  Medication Sig Dispense  Refill  . ALPRAZolam (XANAX) 0.5 MG tablet TAKE 1 TABLET BY MOUTH 3 TIMES DAILY AS NEEDED FOR ANXIETY 90 tablet 2  . amLODipine (NORVASC) 5 MG tablet TAKE ONE TABLET BY MOUTH ONE TIME DAILY *need an office visit* 30 tablet 1  . aspirin EC 81 MG EC tablet Take 1 tablet (81 mg total) by mouth daily.    . clopidogrel (PLAVIX) 75 MG tablet take 1 tablet by mouth once daily with breakfast 90 tablet 3  . estradiol (ESTRACE) 1 MG tablet Take 1 tablet (1 mg total) by mouth daily. 30 tablet 4  . Evolocumab (REPATHA SURECLICK) 081 MG/ML SOAJ Inject 140 mg into the skin every 14 (fourteen) days. 2 pen 6  . guaiFENesin-codeine 100-10 MG/5ML syrup Take 10 mLs by mouth 3 (three) times daily as needed for cough. 118 mL 0  . isosorbide mononitrate (IMDUR)  60 MG 24 hr tablet TAKE ONE TABLET BY MOUTH ONE TIME DAILY  30 tablet 2  . lisinopril (PRINIVIL,ZESTRIL) 5 MG tablet TAKE ONE TABLET BY MOUTH ONE TIME DAILY  90 tablet 2  . metoprolol tartrate (LOPRESSOR) 25 MG tablet TAKE ONE TABLET BY MOUTH TWICE DAILY *need an office visit* 60 tablet 1  . nitroGLYCERIN (NITROSTAT) 0.4 MG SL tablet Place 1 tablet (0.4 mg total) under the tongue every 5 (five) minutes as needed for chest pain. 25 tablet 5  . oseltamivir (TAMIFLU) 75 MG capsule Take 1 capsule (75 mg total) by mouth 2 (two) times daily. 10 capsule 0  . pantoprazole (PROTONIX) 40 MG tablet Take 1 tablet (40 mg total) by mouth 2 (two) times daily. 90 tablet 3  . RANEXA 1000 MG SR tablet TAKE 1 TABLET BY MOUTH 2 TIMES DAILY. (Patient taking differently: TAKE 1 TABLET BY MOUTH DAILY) 180 tablet 2  . triamcinolone ointment (KENALOG) 0.5 % Apply 1 application topically 2 (two) times daily. Use for eczema behind ear as needed 30 g 1   No current facility-administered medications on file prior to visit.     Review of Systems:  As per HPI- otherwise negative. No fever  Physical Examination: There were no vitals filed for this visit. There were no vitals filed for  this visit. There is no height or weight on file to calculate BMI. Ideal Body Weight:   She is not checking home blood pressure Spoke to patient over video.  She appears her normal self, overweight but no distress or tachypnea is apparent.  No rash noted  Assessment and Plan: Diarrhea, unspecified type - Plan: TSH  Fatigue, unspecified type - Plan: CBC with Differential/Platelet, Comprehensive metabolic panel  Virtual visit today due to diarrhea, low temperature, and feeling not herself for the past 3 to 4 days.  Patient reports her diarrhea is actually better today.  However she would like to come in for some labs, which is reasonable.  I have ordered labs as above, she will come in later today and have her blood drawn.  I will be in touch with her pending results.  She is asked to let me know if her symptoms are getting worse, or if any other concerns. She is coming in for labs this afternoon  Signed Lamar Blinks, MD  Addendum 4/2 Received her labs and gave her a call She is feeling a bit better Diarrhea is gone She did not check her temp today as she has felt better  I will mail her labs to her She will let me know if any other concerns   Lab Results  Component Value Date   WBC 6.9 11/01/2018   HGB 12.4 11/01/2018   HCT 36.4 11/01/2018   MCV 92.1 11/01/2018   PLT 271.0 11/01/2018     Chemistry      Component Value Date/Time   NA 136 11/01/2018 1427   K 3.9 11/01/2018 1427   CL 102 11/01/2018 1427   CO2 27 11/01/2018 1427   BUN 16 11/01/2018 1427   CREATININE 1.15 11/01/2018 1427   CREATININE 0.90 04/15/2014 0853      Component Value Date/Time   CALCIUM 9.2 11/01/2018 1427   ALKPHOS 73 11/01/2018 1427   AST 12 11/01/2018 1427   ALT 9 11/01/2018 1427   BILITOT 0.3 11/01/2018 1427   BILITOT 0.4 09/26/2017 0922     Lab Results  Component Value Date   TSH 3.38 11/01/2018

## 2018-11-01 NOTE — Telephone Encounter (Signed)
PA approved on 10/03/2018 and patient notifiied

## 2018-11-01 NOTE — Telephone Encounter (Signed)
This encounter was created in error - please disregard.

## 2018-11-01 NOTE — Telephone Encounter (Signed)
duplicate

## 2018-11-03 ENCOUNTER — Telehealth: Payer: Self-pay | Admitting: Family Medicine

## 2018-11-03 NOTE — Telephone Encounter (Signed)
Called to check on her- temp 97, she is feeling fine

## 2018-11-10 ENCOUNTER — Telehealth: Payer: Self-pay | Admitting: Cardiovascular Disease

## 2018-11-10 NOTE — Telephone Encounter (Signed)
  Patient states that she used to take medication at night but has not done that in a while. She c/o having some chest pain that wakes her up at night. She would like to know if she needs to take something at night again.

## 2018-11-10 NOTE — Telephone Encounter (Signed)
Spoke with pt who states that she has been having chest discomfort for a few weeks and feels that it may be coming from the stress in the world. Pt encouraged to take Ranexa and protonix as prescribed. Pt voiced understanding and will call with update.

## 2018-12-04 ENCOUNTER — Telehealth: Payer: Self-pay | Admitting: Internal Medicine

## 2018-12-04 ENCOUNTER — Encounter: Payer: Self-pay | Admitting: Internal Medicine

## 2018-12-04 ENCOUNTER — Ambulatory Visit (INDEPENDENT_AMBULATORY_CARE_PROVIDER_SITE_OTHER): Payer: Federal, State, Local not specified - PPO | Admitting: Internal Medicine

## 2018-12-04 ENCOUNTER — Other Ambulatory Visit: Payer: Self-pay | Admitting: Cardiovascular Disease

## 2018-12-04 ENCOUNTER — Other Ambulatory Visit: Payer: Self-pay

## 2018-12-04 DIAGNOSIS — Z86010 Personal history of colonic polyps: Secondary | ICD-10-CM

## 2018-12-04 DIAGNOSIS — R197 Diarrhea, unspecified: Secondary | ICD-10-CM | POA: Diagnosis not present

## 2018-12-04 DIAGNOSIS — R1084 Generalized abdominal pain: Secondary | ICD-10-CM

## 2018-12-04 DIAGNOSIS — K219 Gastro-esophageal reflux disease without esophagitis: Secondary | ICD-10-CM | POA: Diagnosis not present

## 2018-12-04 DIAGNOSIS — K222 Esophageal obstruction: Secondary | ICD-10-CM | POA: Diagnosis not present

## 2018-12-04 NOTE — Progress Notes (Signed)
HISTORY OF PRESENT ILLNESS:  [name] is a 63 y.o. female who is urgently scheduled for this telehealth visit during the coronavirus pandemic with chief complaint of abdominal pain and bloody diarrhea.  Patient has a presumptive remote diagnosis of ischemic colitis for which she was evaluated by the GI physician assistant January 2013.  Shortly thereafter she underwent CTA which did not reveal any evidence of colitis or mesenteric stenosis.  She is also followed in this office for GERD complicated by peptic stricture for which she last underwent upper endoscopy with esophageal dilation August 2019.  Because of a history of adenomatous colon polyps and a family history of colon cancer her last surveillance colonoscopy was also August 2019 with diminutive adenomas removed.  Patient tells me that she has been having some mild vague mid abdominal discomfort at times but is otherwise well until yesterday morning when she experienced diaphoresis followed by diarrhea then bloody diarrhea.  There was some abdominal discomfort.  All of her problems have improved today though she did have some bloody diarrhea earlier.  Abdominal pain today is not overwhelming.  No fevers.  She does have weakness.  She is on 3 blood pressure medications which are not new.  She tells me that she does not check her blood pressure at home.  She also has a history of coronary artery disease and is on Plavix.  REVIEW OF SYSTEMS:  All non-GI ROS negative unless otherwise stated in the HPI except for anxiety  Past Medical History:  Diagnosis Date  . Angina effort 05/22/2013  . Anxiety   . CAD (coronary artery disease), possible SCAD 12/14/2012  . Colon polyps   . Duodenitis   . Dyspnea on exertion    LEXISCAN, 09/30/2008 - normal, EKG negative for ischemia, no ECG changes  . Esophageal stricture   . Gastritis   . GERD (gastroesophageal reflux disease)   . Gout    "only from RX given after MI" (01/22/2013)  . H/O hiatal  hernia   . History of esophageal stricture   . Hypertension    2D ECHO, 09/30/2008 - EF >55%, normal: RENAL DOPPLER, 03/21/2007 - normal duplex study  . Hypothyroidism   . IBS (irritable bowel syndrome)   . Ischemic colitis ([place])   . Lower GI bleeding    10/07/11 "I've had 3 episodes in the past year"  . Myocardial infarction [facility]) 12/10/2012   "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)  . Tubular adenoma polyp of rectum 2008    Past Surgical History:  Procedure Laterality Date  . ABDOMINAL HYSTERECTOMY  1999  . ANTERIOR LUMBAR FUSION  2010   "L4-5" (01/22/2013)  . BACK SURGERY  09/2008   FUSION/l4-5  . CARDIAC CATHETERIZATION N/A 02/06/2015   Procedure: Left Heart Cath and Coronary Angiography;  Surgeon: Leonie Man, MD;  Location: [place] CV LAB;  Service: Cardiovascular;  Laterality: N/A;  . ESOPHAGEAL DILATION  08/2010  . ESOPHAGEAL DILATION     "once or twice" (01/22/2013)  . HAMMER TOE SURGERY Bilateral ~ 2002  . LEFT HEART CATHETERIZATION WITH CORONARY ANGIOGRAM N/A 12/11/2012   Procedure: LEFT HEART CATHETERIZATION WITH CORONARY ANGIOGRAM;  Surgeon: Leonie Man, MD;  Location: [facility] CATH LAB;  Service: Cardiovascular;  Laterality: N/A;  . LEFT HEART CATHETERIZATION WITH CORONARY ANGIOGRAM N/A 12/15/2012   Procedure: LEFT HEART CATHETERIZATION WITH CORONARY ANGIOGRAM;  Surgeon: Leonie Man, MD;  Location: [facility] CATH LAB;  Service: Cardiovascular;  Laterality: N/A;  . PERIPHERAL VENOUS STUDY  10/03/2008   No evidence of DVT, superficial thrombosis, or Baker's cyst  . TONSILLECTOMY AND ADENOIDECTOMY  1960's  . [place]    Social History [name]  reports that she has never smoked. She has never used smokeless tobacco. She reports that she does not drink alcohol or use drugs.  family history includes Colon cancer in her brother; Heart attack in her brother; Kidney disease in her mother; Multiple myeloma in her sister; Other in her brother and  brother; Stroke in her father.  No Known Allergies     PHYSICAL EXAMINATION: No physical examination with telehealth visit   ASSESSMENT:  1.  Abdominal discomfort and bloody diarrhea.  Mild to moderate by history.  Improving.  Differential diagnosis includes ischemic colitis in the patient with risk factors had a prior history of the same versus infectious (bacterial) colitis.  I favor the former. 2.  History of adenomatous colon polyps.  Surveillance colonoscopy August 2019 as described 3.  GERD complicated by peptic stricture.  Asymptomatic post dilation (August 2019) on PPI 4.  Multiple medical problems   PLAN:  1.  Discussion on ischemic colitis 2.  Rest and hydration 3.  I have asked her to hold her lisinopril and Norvasc today but continue her beta-blocker.  She should resume her normal medication schedule tomorrow 4.  Contact the office should she experience worsening symptoms or develop fever, in which case she will need blood work and imaging (CT scan) 5.  Reflux precautions and continue PPI 6.  Surveillance colonoscopy around August 2024 7.  Routine office follow-up for GERD in 1 year 8.  Ongoing general medical care with PCP and other specialists This telehealth visit ([place] patient during the coronavirus pandemic) was initiated by the patient who consented to the evaluation.  She was in her home and I was in my office during the evaluation.  She understands her may be an associated professional charge for the service

## 2018-12-04 NOTE — Telephone Encounter (Signed)
Patient called said she has colitis and is bleeding pretty bad would like to speak to the nurse

## 2018-12-04 NOTE — Telephone Encounter (Signed)
I will need to review her record carefully.  Please schedule telemedicine visit for 1:30 PM today.  However, if she is in severe pain, she will need to go to the hospital.  Thanks

## 2018-12-04 NOTE — Telephone Encounter (Signed)
Pt states she was diagnosed with ischemic colitis several years ago. States yesterday she broke out in a sweat and had abdominal cramping. She reports she had diarrhea into the night and started passing BRB. Reports she has colored the toilet bowl red and there has been blood on the tissue. She continues to have abdominal cramping. Pt requesting some medication, what she had in the past has expired. Please advise.

## 2018-12-04 NOTE — Telephone Encounter (Signed)
Pt scheduled for visit today at 1:30pm. Pt aware of appt.

## 2018-12-05 NOTE — Patient Instructions (Addendum)
If you are age 63 or older, your body mass index should be between 23-30. Your There is no height or weight on file to calculate BMI. If this is out of the aforementioned range listed, please consider follow up with your Primary Care Provider.  If you are age 60 or younger, your body mass index should be between 19-25. Your There is no height or weight on file to calculate BMI. If this is out of the aformentioned range listed, please consider follow up with your Primary Care Provider.   1.  Discussed ischemic colitis  2.  Rest and hydration  3.  HOLD Lisinopril and Norvasc today but continue beta-blocker.   Resume normal medication schedule tomorrow  4.  Contact the office should you experience worsening symptoms or develop fever, in which case you will need blood work and imaging (CT scan)  5.  Reflux precautions and continue PPI  (see attached)  6.  Surveillance colonoscopy around August 2024  7.  Routine office follow-up for GERD in 1 year  8.  Ongoing general medical care with PCP and other specialists   Gastroesophageal Reflux Disease, Adult Gastroesophageal reflux (GER) happens when acid from the stomach flows up into the tube that connects the mouth and the stomach (esophagus). Normally, food travels down the esophagus and stays in the stomach to be digested. However, when a person has GER, food and stomach acid sometimes move back up into the esophagus. If this becomes a more serious problem, the person may be diagnosed with a disease called gastroesophageal reflux disease (GERD). GERD occurs when the reflux:  Happens often.  Causes frequent or severe symptoms.  Causes problems such as damage to the esophagus. When stomach acid comes in contact with the esophagus, the acid may cause soreness (inflammation) in the esophagus. Over time, GERD may create small holes (ulcers) in the lining of the esophagus. What are the causes? This condition is caused by a problem with the muscle  between the esophagus and the stomach (lower esophageal sphincter, or LES). Normally, the LES muscle closes after food passes through the esophagus to the stomach. When the LES is weakened or abnormal, it does not close properly, and that allows food and stomach acid to go back up into the esophagus. The LES can be weakened by certain dietary substances, medicines, and medical conditions, including:  Tobacco use.  Pregnancy.  Having a hiatal hernia.  Alcohol use.  Certain foods and beverages, such as coffee, chocolate, onions, and peppermint. What increases the risk? You are more likely to develop this condition if you:  Have an increased body weight.  Have a connective tissue disorder.  Use NSAID medicines. What are the signs or symptoms? Symptoms of this condition include:  Heartburn.  Difficult or painful swallowing.  The feeling of having a lump in the throat.  Abitter taste in the mouth.  Bad breath.  Having a large amount of saliva.  Having an upset or bloated stomach.  Belching.  Chest pain. Different conditions can cause chest pain. Make sure you see your health care provider if you experience chest pain.  Shortness of breath or wheezing.  Ongoing (chronic) cough or a night-time cough.  Wearing away of tooth enamel.  Weight loss. How is this diagnosed? Your health care provider will take a medical history and perform a physical exam. To determine if you have mild or severe GERD, your health care provider may also monitor how you respond to treatment. You may also have  tests, including:  A test to examine your stomach and esophagus with a small camera (endoscopy).  A test thatmeasures the acidity level in your esophagus.  A test thatmeasures how much pressure is on your esophagus.  A barium swallow or modified barium swallow test to show the shape, size, and functioning of your esophagus. How is this treated? The goal of treatment is to help  relieve your symptoms and to prevent complications. Treatment for this condition may vary depending on how severe your symptoms are. Your health care provider may recommend:  Changes to your diet.  Medicine.  Surgery. Follow these instructions at home: Eating and drinking   Follow a diet as recommended by your health care provider. This may involve avoiding foods and drinks such as: ? Coffee and tea (with or without caffeine). ? Drinks that containalcohol. ? Energy drinks and sports drinks. ? Carbonated drinks or sodas. ? Chocolate and cocoa. ? Peppermint and mint flavorings. ? Garlic and onions. ? Horseradish. ? Spicy and acidic foods, including peppers, chili powder, curry powder, vinegar, hot sauces, and barbecue sauce. ? Citrus fruit juices and citrus fruits, such as oranges, lemons, and limes. ? Tomato-based foods, such as red sauce, chili, salsa, and pizza with red sauce. ? Fried and fatty foods, such as donuts, french fries, potato chips, and high-fat dressings. ? High-fat meats, such as hot dogs and fatty cuts of red and white meats, such as rib eye steak, sausage, ham, and bacon. ? High-fat dairy items, such as whole milk, butter, and cream cheese.  Eat small, frequent meals instead of large meals.  Avoid drinking large amounts of liquid with your meals.  Avoid eating meals during the 2-3 hours before bedtime.  Avoid lying down right after you eat.  Do not exercise right after you eat. Lifestyle   Do not use any products that contain nicotine or tobacco, such as cigarettes, e-cigarettes, and chewing tobacco. If you need help quitting, ask your health care provider.  Try to reduce your stress by using methods such as yoga or meditation. If you need help reducing stress, ask your health care provider.  If you are overweight, reduce your weight to an amount that is healthy for you. Ask your health care provider for guidance about a safe weight loss goal. General  instructions  Pay attention to any changes in your symptoms.  Take over-the-counter and prescription medicines only as told by your health care provider. Do not take aspirin, ibuprofen, or other NSAIDs unless your health care provider told you to do so.  Wear loose-fitting clothing. Do not wear anything tight around your waist that causes pressure on your abdomen.  Raise (elevate) the head of your bed about 6 inches (15 cm).  Avoid bending over if this makes your symptoms worse.  Keep all follow-up visits as told by your health care provider. This is important. Contact a health care provider if:  You have: ? New symptoms. ? Unexplained weight loss. ? Difficulty swallowing or it hurts to swallow. ? Wheezing or a persistent cough. ? A hoarse voice.  Your symptoms do not improve with treatment. Get help right away if you:  Have pain in your arms, neck, jaw, teeth, or back.  Feel sweaty, dizzy, or light-headed.  Have chest pain or shortness of breath.  Vomit and your vomit looks like blood or coffee grounds.  Faint.  Have stool that is bloody or black.  Cannot swallow, drink, or eat. Summary  Gastroesophageal reflux happens when  acid from the stomach flows up into the esophagus. GERD is a disease in which the reflux happens often, causes frequent or severe symptoms, or causes problems such as damage to the esophagus.  Treatment for this condition may vary depending on how severe your symptoms are. Your health care provider may recommend diet and lifestyle changes, medicine, or surgery.  Contact a health care provider if you have new or worsening symptoms.  Take over-the-counter and prescription medicines only as told by your health care provider. Do not take aspirin, ibuprofen, or other NSAIDs unless your health care provider told you to do so.  Keep all follow-up visits as told by your health care provider. This is important. This information is not intended to replace  advice given to you by your health care provider. Make sure you discuss any questions you have with your health care provider. Document Released: 04/28/2005 Document Revised: 01/25/2018 Document Reviewed: 01/25/2018 Elsevier Interactive Patient Education  2019 [place] you for choosing me and [facility].   Scarlette Shorts, MD

## 2018-12-08 ENCOUNTER — Telehealth: Payer: Self-pay | Admitting: Cardiovascular Disease

## 2018-12-08 NOTE — Telephone Encounter (Signed)
New Message    Pt is calling because she would like to talk to the nurse and explain to her some things that are going on.    Please call

## 2018-12-08 NOTE — Telephone Encounter (Signed)
Spoke with pt who states her gastroenterologist told her she had a 'mini heart attack in one of arteries leading to intestines and colon' and she wanted to make Dr. Gwenlyn Found aware. Per 5/4 gastroenterologist note "patient has a presumptive remote diagnosis of ischemic colitis." States that she was told to hold lisinopril and Norvasc for a few days and then resume, which she has.  Pt also c/o constant throbbing pain to left side of neck that has lasted 'for a few weeks'. Rates pain at 5 on scale from 1-10. Denies chest discomfort/pain. Denies SOB, lightheadedness/dizziness. Denies pain/discomfort/weakness/numbness to arms. Advised that pt may try OTC acetaminophen and/or cold/heat therapy to help alleviate but that message would be routed to Dr. Gwenlyn Found at patient's request.  Pt also states that she has been unable to pick up Repatha at her preferred pharmacy because her health insurance will not allow her to do so? Pt stated that [facility] is where she gets all her meds so she doesn't understand why she cannot pick up her Repatha there. Staff message sent to pharmD to advise.

## 2018-12-11 ENCOUNTER — Other Ambulatory Visit: Payer: Self-pay | Admitting: Cardiovascular Disease

## 2018-12-11 NOTE — Telephone Encounter (Signed)
New Message    *STAT* If patient is at the pharmacy, call can be transferred to refill team.   1. Which medications need to be refilled? (please list name of each medication and dose if known) Repatha  2. Which pharmacy/location (including street and city if local pharmacy) is medication to be sent to? [facility] [phone]  3. Do they need a 30 day or 90 day supply? 30 day supply

## 2018-12-12 MED ORDER — EVOLOCUMAB 140 MG/ML ~~LOC~~ SOAJ: 140.0000 mg | SUBCUTANEOUS | 3 refills | Status: DC

## 2018-12-13 ENCOUNTER — Telehealth: Payer: Self-pay | Admitting: Cardiovascular Disease

## 2018-12-13 NOTE — Telephone Encounter (Signed)
Pt c/o medication issue:  1. Name of Medication:  Evolocumab (REPATHA SURECLICK) 009 MG/ML SOAJ    2. How are you currently taking this medication (dosage and times per day)?   3. Are you having a reaction (difficulty breathing--STAT)?   4. What is your medication issue? Patient is needing a prior authorization for this medication. It needs to be sent to [phone].

## 2018-12-13 NOTE — Telephone Encounter (Signed)
Was determined favorable 2 mos ago

## 2018-12-13 NOTE — Telephone Encounter (Signed)
New Message    *STAT* If patient is at the pharmacy, call can be transferred to refill team.   1. Which medications need to be refilled? (please list name of each medication and dose if known) Repatha  2. Which pharmacy/location (including street and city if local pharmacy) is medication to be sent to? [facility]  [phone]  3. Do they need a 30 day or 90 day supply? 30 or 90

## 2018-12-19 ENCOUNTER — Telehealth: Payer: Self-pay

## 2018-12-19 MED ORDER — EVOLOCUMAB 140 MG/ML ~~LOC~~ SOAJ: 140.0000 mg | SUBCUTANEOUS | 3 refills | Status: DC

## 2018-12-19 NOTE — Addendum Note (Signed)
Addended by: Rockne Menghini on: 12/19/2018 04:42 PM   Modules accepted: Orders

## 2018-12-19 NOTE — Telephone Encounter (Signed)
Hi [name], got a Designer, television/film set from [employer] stating that pt needs Repatha SureClick refilled. The phone number is [phone] - Fax: [phone]  Please address. Thank you

## 2018-12-22 ENCOUNTER — Other Ambulatory Visit: Payer: Self-pay | Admitting: Pharmacist Clinician (PhC)/ Clinical Pharmacy Specialist

## 2018-12-22 MED ORDER — EVOLOCUMAB 140 MG/ML ~~LOC~~ SOAJ: 140.0000 mg | SUBCUTANEOUS | 3 refills | Status: DC

## 2018-12-30 ENCOUNTER — Other Ambulatory Visit: Payer: Self-pay | Admitting: Cardiovascular Disease

## 2019-01-31 ENCOUNTER — Telehealth: Payer: Self-pay | Admitting: Cardiovascular Disease

## 2019-01-31 DIAGNOSIS — K080 Exfoliation of teeth due to systemic causes: Secondary | ICD-10-CM | POA: Diagnosis not present

## 2019-01-31 MED ORDER — PANTOPRAZOLE SODIUM 40 MG PO TBEC: 40.0000 mg | DELAYED_RELEASE_TABLET | Freq: Every day | ORAL | 3 refills | Status: DC

## 2019-01-31 NOTE — Telephone Encounter (Signed)
New message  Pt c/o medication issue:  1. Name of Medication: pantoprazole (PROTONIX) 40 MG tablet  2. How are you currently taking this medication (dosage and times per day)? 1 time daily  3. Are you having a reaction (difficulty breathing--STAT)? No   4. What is your medication issue? Patient states that her prescription has ran out please send new prescription to [facility] on [address], [place].

## 2019-01-31 NOTE — Telephone Encounter (Signed)
SPOKE TO PATIENT -  AWARE MEDICATION WILL BE SENT TO  [facility] - ONE TABLET DAILY   PATIENT AWARE IN FUTURE WILL NEED PRIMARY OR GI TO REFILL. PATIENT VERBALIZED UNDERSTANDING.

## 2019-02-01 ENCOUNTER — Other Ambulatory Visit: Payer: Self-pay | Admitting: Family Medicine

## 2019-02-01 DIAGNOSIS — Z780 Asymptomatic menopausal state: Secondary | ICD-10-CM

## 2019-02-01 NOTE — Telephone Encounter (Signed)
Pt states her OB has changed her appt several times and now her Rx  estradiol (ESTRACE) 1 MG tablet  needs refilling.  Pt has appt July 14 with her OB, but they have never filled for her, and Dr Lorelei Pont had been refilling until she could get to her appt with them. Pt hopes she will refill one more time please.   [facility] # [address], [place] [phone] (Phone) [phone] (Fax)

## 2019-02-08 ENCOUNTER — Telehealth: Payer: Self-pay | Admitting: Family Medicine

## 2019-02-08 NOTE — Telephone Encounter (Signed)
Called her back-  LMOM The antibody test is not that reliable, although we can order it for her if she likes.  Please let me know if she wants an ab test or if she has any other concerns now

## 2019-02-08 NOTE — Telephone Encounter (Signed)
Pt states seen at practice 11/01/2018 for fatigue and diarrhea. States she is now concerned she may have had covid 19 at that time. Denies any symptoms presently. States   "Just want Dr. Serita Day opinion." Please advise: CB# [phone]

## 2019-02-08 NOTE — Telephone Encounter (Signed)
Please advise 

## 2019-02-09 NOTE — Telephone Encounter (Signed)
FYI

## 2019-02-09 NOTE — Telephone Encounter (Signed)
Patient wanted PCP to be aware that she changed her mind regarding having the antibody test, patient states she will wait until a better test comes out.

## 2019-02-13 DIAGNOSIS — F419 Anxiety disorder, unspecified: Secondary | ICD-10-CM | POA: Diagnosis not present

## 2019-02-13 DIAGNOSIS — Z6837 Body mass index (BMI) 37.0-37.9, adult: Secondary | ICD-10-CM | POA: Diagnosis not present

## 2019-02-13 DIAGNOSIS — Z01419 Encounter for gynecological examination (general) (routine) without abnormal findings: Secondary | ICD-10-CM | POA: Diagnosis not present

## 2019-02-13 DIAGNOSIS — Z79890 Hormone replacement therapy (postmenopausal): Secondary | ICD-10-CM | POA: Diagnosis not present

## 2019-02-13 DIAGNOSIS — Z1231 Encounter for screening mammogram for malignant neoplasm of breast: Secondary | ICD-10-CM | POA: Diagnosis not present

## 2019-02-15 DIAGNOSIS — K080 Exfoliation of teeth due to systemic causes: Secondary | ICD-10-CM | POA: Diagnosis not present

## 2019-03-13 ENCOUNTER — Telehealth: Payer: Self-pay

## 2019-03-13 DIAGNOSIS — Z130 Encounter for screening for diseases of the blood and blood-forming organs and certain disorders involving the immune mechanism: Secondary | ICD-10-CM

## 2019-03-13 DIAGNOSIS — Z13220 Encounter for screening for lipoid disorders: Secondary | ICD-10-CM

## 2019-03-13 DIAGNOSIS — R3129 Other microscopic hematuria: Secondary | ICD-10-CM

## 2019-03-13 DIAGNOSIS — I10 Essential (primary) hypertension: Secondary | ICD-10-CM

## 2019-03-13 DIAGNOSIS — R7303 Prediabetes: Secondary | ICD-10-CM

## 2019-03-13 NOTE — Telephone Encounter (Signed)
Ok- not 100% sure which labs she wants but ordered a BMP, A1c and CBC for her.  Our main objective is to recheck her GFR.  Please call her to schedule a lab appt

## 2019-03-13 NOTE — Addendum Note (Signed)
Addended by: Lamar Blinks C on: 03/13/2019 02:29 PM   Modules accepted: Orders

## 2019-03-13 NOTE — Telephone Encounter (Signed)
Patient declined appointment but is requesting lab work. Has not been seen in a couple of months. Please advise.

## 2019-03-13 NOTE — Telephone Encounter (Signed)
Copied from [place] [id number]. Topic: Quick Communication - See Telephone Encounter >> Mar 12, 2019  8:55 AM Loma [place] wrote: CRM for notification. See Telephone encounter for: 03/12/19. Have called office 3 times. Pt does not want an appt but wants Dr Lorelei Pont to order lab work for a new med, has not seen Dr Lorelei Pont since Feb. [phone]  PLS FU by fowarding to her nurse.

## 2019-03-15 NOTE — Telephone Encounter (Signed)
Pt states she wanted her cholesterol checked, but none of these lab orders include that. Can you add if that is ok?

## 2019-03-16 NOTE — Telephone Encounter (Signed)
Attempted to reach pt and left message to return our call to schedule Day lab appt.  [name] (Patient) [name] (Patient) Appointment Scheduling - Scheduling Inquiry for Clinic  Summary: lab appt  Reason for CRM: please call back to make lab appt

## 2019-03-16 NOTE — Telephone Encounter (Signed)
LM for pt to call and schedule lab only appt

## 2019-03-16 NOTE — Telephone Encounter (Signed)
Hey guys, could you schedule this patient for a lab only visit.

## 2019-03-16 NOTE — Addendum Note (Signed)
Addended by: Wynonia Musty A on: 03/16/2019 07:30 AM   Modules accepted: Orders

## 2019-03-22 ENCOUNTER — Other Ambulatory Visit: Payer: Self-pay

## 2019-03-22 ENCOUNTER — Other Ambulatory Visit (INDEPENDENT_AMBULATORY_CARE_PROVIDER_SITE_OTHER): Payer: Federal, State, Local not specified - PPO

## 2019-03-22 DIAGNOSIS — I10 Essential (primary) hypertension: Secondary | ICD-10-CM

## 2019-03-22 DIAGNOSIS — R7303 Prediabetes: Secondary | ICD-10-CM

## 2019-03-22 DIAGNOSIS — Z13220 Encounter for screening for lipoid disorders: Secondary | ICD-10-CM

## 2019-03-22 LAB — LIPID PANEL
Cholesterol: 161 mg/dL (ref 0–200)
HDL: 63.3 mg/dL (ref >39.00)
LDL Cholesterol: 62 mg/dL (ref 0–99)
NonHDL: 97.65
Total CHOL/HDL Ratio: 3
Triglycerides: 179 mg/dL — ABNORMAL HIGH (ref 0.0–149.0)
VLDL: 35.8 mg/dL (ref 0.0–40.0)

## 2019-03-22 LAB — CBC
HCT: 36.3 % (ref 36.0–46.0)
Hemoglobin: 12.3 g/dL (ref 12.0–15.0)
MCHC: 33.9 g/dL (ref 30.0–36.0)
MCV: 93 fl (ref 78.0–100.0)
Platelets: 276 10*3/uL (ref 150.0–400.0)
RBC: 3.91 Mil/uL (ref 3.87–5.11)
RDW: 13.1 % (ref 11.5–15.5)
WBC: 7 10*3/uL (ref 4.0–10.5)

## 2019-03-22 LAB — COMPREHENSIVE METABOLIC PANEL
ALT: 9 U/L (ref 0–35)
AST: 9 U/L (ref 0–37)
Albumin: 3.8 g/dL (ref 3.5–5.2)
Alkaline Phosphatase: 74 U/L (ref 39–117)
BUN: 18 mg/dL (ref 6–23)
CO2: 24 mEq/L (ref 19–32)
Calcium: 8.9 mg/dL (ref 8.4–10.5)
Chloride: 103 mEq/L (ref 96–112)
Creatinine, Ser: 1.15 mg/dL (ref 0.40–1.20)
GFR: 47.66 mL/min — ABNORMAL LOW (ref >60.00)
Glucose, Bld: 101 mg/dL — ABNORMAL HIGH (ref 70–99)
Potassium: 4.1 mEq/L (ref 3.5–5.1)
Sodium: 137 mEq/L (ref 135–145)
Total Bilirubin: 0.4 mg/dL (ref 0.2–1.2)
Total Protein: 6.6 g/dL (ref 6.0–8.3)

## 2019-03-22 LAB — HEMOGLOBIN A1C: Hgb A1c MFr Bld: 5.5 % (ref 4.6–6.5)

## 2019-03-23 NOTE — Progress Notes (Signed)
Received her labs, letter to pt- routed to [place], please print and send  Results for orders placed or performed in visit on 03/22/19  Lipid panel  Result Value Ref Range   Cholesterol 161 0 - 200 mg/dL   Triglycerides 179.0 (H) 0.0 - 149.0 mg/dL   HDL 63.30 >39.00 mg/dL   VLDL 35.8 0.0 - 40.0 mg/dL   LDL Cholesterol 62 0 - 99 mg/dL   Total CHOL/HDL Ratio 3    NonHDL 97.65   Hemoglobin A1c  Result Value Ref Range   Hgb A1c MFr Bld 5.5 4.6 - 6.5 %  Comprehensive metabolic panel  Result Value Ref Range   Sodium 137 135 - 145 mEq/L   Potassium 4.1 3.5 - 5.1 mEq/L   Chloride 103 96 - 112 mEq/L   CO2 24 19 - 32 mEq/L   Glucose, Bld 101 (H) 70 - 99 mg/dL   BUN 18 6 - 23 mg/dL   Creatinine, Ser 1.15 0.40 - 1.20 mg/dL   Total Bilirubin 0.4 0.2 - 1.2 mg/dL   Alkaline Phosphatase 74 39 - 117 U/L   AST 9 0 - 37 U/L   ALT 9 0 - 35 U/L   Total Protein 6.6 6.0 - 8.3 g/dL   Albumin 3.8 3.5 - 5.2 g/dL   Calcium 8.9 8.4 - 10.5 mg/dL   GFR 47.66 (L) >60.00 mL/min  CBC  Result Value Ref Range   WBC 7.0 4.0 - 10.5 K/uL   RBC 3.91 3.87 - 5.11 Mil/uL   Platelets 276.0 150.0 - 400.0 K/uL   Hemoglobin 12.3 12.0 - 15.0 g/dL   HCT 36.3 36.0 - 46.0 %   MCV 93.0 78.0 - 100.0 fl   MCHC 33.9 30.0 - 36.0 g/dL   RDW 13.1 11.5 - 15.5 %   repatha  Lisinopril   a1c improved  Lipids improved Renal stable

## 2019-04-02 ENCOUNTER — Other Ambulatory Visit: Payer: Self-pay | Admitting: Cardiovascular Disease

## 2019-04-04 ENCOUNTER — Telehealth: Payer: Self-pay

## 2019-04-04 NOTE — Telephone Encounter (Signed)
Copied from [place] [id number]. Topic: General - Other >> Mar 28, 2019  2:29 PM Leward Quan A wrote: Reason for CRM: Patient called to request a call back from the nurse with the results of her lab results especially the cholesterol. Ph# [phone]

## 2019-05-03 ENCOUNTER — Telehealth: Payer: Self-pay

## 2019-05-03 NOTE — Telephone Encounter (Signed)
Pt stated the cost to high and got on the telephone w/[facility] and was 67 in the line and can't in good faith keep other people that are out of work from taking the help and would like to see if there are any other options because she doesn't want to pay $70 a month she stated that she will stop therapy if there is no other option for her that can be cheaper.

## 2019-05-03 NOTE — Telephone Encounter (Signed)
I called the pt and offered a copay card that I will be sending in the mail and the pt voiced understanding

## 2019-05-25 ENCOUNTER — Telehealth: Payer: Self-pay

## 2019-05-25 DIAGNOSIS — Z20822 Contact with and (suspected) exposure to covid-19: Secondary | ICD-10-CM

## 2019-05-25 DIAGNOSIS — Z20828 Contact with and (suspected) exposure to other viral communicable diseases: Secondary | ICD-10-CM

## 2019-05-25 NOTE — Telephone Encounter (Signed)
Copied from [place] [id number]. Topic: General - Other >> May 25, 2019  9:06 AM Celene Kras A wrote: Reason for CRM: Pt called stating she was in contact with her granddaughter who was exposed to someone who tested positive for covid. Pt is requesting advice on if she should be tested. Pt states granddaughter is not exhibiting any symptoms. Please advise.

## 2019-05-25 NOTE — Telephone Encounter (Signed)
Already addressed please see previous encounter.

## 2019-05-25 NOTE — Telephone Encounter (Signed)
Called her back and talked- she is going to see if her grand-daughter tests negative or positive and will get tested based on this and her sx.  I ordered the test for her and explained testing procedure in case she needs it

## 2019-05-25 NOTE — Addendum Note (Signed)
Addended by: Lamar Blinks C on: 05/25/2019 01:33 PM   Modules accepted: Orders

## 2019-05-25 NOTE — Telephone Encounter (Signed)
Copied from [place] [id number]. Topic: General - Other >> May 25, 2019  9:06 AM Celene Kras A wrote: Reason for CRM: Pt called stating she was in contact with her granddaughter who was exposed to someone who tested positive for covid. Pt is requesting advice on if she should be tested. Pt states granddaughter is not exhibiting any symptoms. Please advise.

## 2019-06-02 ENCOUNTER — Other Ambulatory Visit: Payer: Self-pay | Admitting: Cardiovascular Disease

## 2019-07-02 ENCOUNTER — Other Ambulatory Visit: Payer: Self-pay | Admitting: Cardiovascular Disease

## 2019-07-04 ENCOUNTER — Telehealth: Payer: Self-pay | Admitting: Cardiovascular Disease

## 2019-07-04 NOTE — Telephone Encounter (Signed)
New message:    Patient calling stating that [place] sent over a form and send it back concering some medications. Please call patient.

## 2019-07-04 NOTE — Telephone Encounter (Signed)
Per pt [employer] is requiring a prior auth for Pantoprazole 40 mg Per pt paid $25.00 for what she thinks was a 90 day supply but if approved would pay more Per pt [employer] stated that a form was sent in March and July and was never filled out and returned .Adonis Housekeeper

## 2019-08-02 ENCOUNTER — Other Ambulatory Visit: Payer: Self-pay | Admitting: Cardiovascular Disease

## 2019-08-02 NOTE — Telephone Encounter (Signed)
Rx request sent to pharmacy.  

## 2019-08-13 ENCOUNTER — Telehealth: Payer: Self-pay | Admitting: Cardiovascular Disease

## 2019-08-13 NOTE — Telephone Encounter (Signed)
Spoke with pt who states she does not have any appts scheduled for COVID-19 vaccination, but had gone online to see what phase she was in. Advised that Dr. Gwenlyn Found not able to facilitate her having sooner vaccine appt. Advised of the following:  Currently, there is a hotline to call (active 08/10/19) to schedule vaccination appointments as no walk-ins will be accepted. Number: [phone] If you have further questions or concerns about the vaccine process, please visit [email or web address] or contact your primary care physician.   Pt verbalized understanding

## 2019-08-13 NOTE — Telephone Encounter (Signed)
New Message:      Pt wants to know if there anything that Dr Gwenlyn Found can do so she can get her COVID Vaccine early?

## 2019-08-16 DIAGNOSIS — K080 Exfoliation of teeth due to systemic causes: Secondary | ICD-10-CM | POA: Diagnosis not present

## 2019-09-01 ENCOUNTER — Other Ambulatory Visit: Payer: Self-pay | Admitting: Cardiovascular Disease

## 2019-09-17 ENCOUNTER — Other Ambulatory Visit: Payer: Self-pay

## 2019-09-25 ENCOUNTER — Encounter (INDEPENDENT_AMBULATORY_CARE_PROVIDER_SITE_OTHER): Payer: Self-pay

## 2019-09-25 ENCOUNTER — Ambulatory Visit: Payer: Federal, State, Local not specified - PPO | Admitting: Cardiovascular Disease

## 2019-09-25 ENCOUNTER — Other Ambulatory Visit: Payer: Self-pay

## 2019-09-25 ENCOUNTER — Encounter: Payer: Self-pay | Admitting: Cardiovascular Disease

## 2019-09-25 VITALS — BP 110/78 | HR 71 | Temp 98.3°F | Ht 63.0 in | Wt 212.0 lb

## 2019-09-25 DIAGNOSIS — I10 Essential (primary) hypertension: Secondary | ICD-10-CM | POA: Diagnosis not present

## 2019-09-25 DIAGNOSIS — R0602 Shortness of breath: Secondary | ICD-10-CM

## 2019-09-25 DIAGNOSIS — I2510 Atherosclerotic heart disease of native coronary artery without angina pectoris: Secondary | ICD-10-CM

## 2019-09-25 DIAGNOSIS — E782 Mixed hyperlipidemia: Secondary | ICD-10-CM | POA: Diagnosis not present

## 2019-09-25 NOTE — Assessment & Plan Note (Signed)
History of CAD with a known occluded LAD by cath last performed by Dr. Ellyn Hack 5/16.  It was decided not to intervene on this since this was potentially related to SCAD.  She currently denies chest pain but has noticed increasing dyspnea on exertion over the last several weeks.  I am to get a 2D echo to further evaluate.  She may need a coronary CTA as well

## 2019-09-25 NOTE — Patient Instructions (Signed)
Medication Instructions:  Your physician recommends that you continue on your current medications as directed. Please refer to the Current Medication list given to you today.  If you need a refill on your cardiac medications before your next appointment, please call your pharmacy.   Lab work: Fasting Lipids and Hepatic Function If you have labs (blood work) drawn today and your tests are completely normal, you will receive your results only by: MyChart Message (if you have MyChart) OR A paper copy in the mail If you have any lab test that is abnormal or we need to change your treatment, we will call you to review the results.  Testing/Procedures: Your physician has requested that you have an echocardiogram. Echocardiography is a painless test that uses sound waves to create images of your heart. It provides your doctor with information about the size and shape of your heart and how well your heart's chambers and valves are working. This procedure takes approximately one hour. There are no restrictions for this procedure. [place] [address]  Follow-Up: At [facility], you and your health needs are our priority.  As part of our continuing mission to provide you with exceptional heart care, we have created designated Provider Care Teams.  These Care Teams include your primary Cardiologist (physician) and Advanced Practice Providers (APPs -  Physician Assistants and Nurse Practitioners) who all work together to provide you with the care you need, when you need it. You may see Quay Burow, MD or one of the following Advanced Practice Providers on your designated Care Team:    Kerin Ransom, [name]  [place], [place]  Coletta Memos, [place]  Your physician wants you to follow-up in: 6-8 weeks with Dr. Gwenlyn Found  Any Other Special Instructions Will Be Listed Below (If Applicable). You have been referred to Dr. [facility]. Please make an appointment to see him.

## 2019-09-25 NOTE — Assessment & Plan Note (Signed)
History of hyperlipidemia intolerant to statin therapy on Repatha briefly but this was discontinued because of financial constraints.  Her last lipid profile performed 03/22/2019 revealed a total cholesterol of 161 with an LDL 62 on Repatha.  We will recheck a fasting lipid liver profile.  I am going refer her to Dr. Debara Pickett for further evaluation and treatment.

## 2019-09-25 NOTE — Assessment & Plan Note (Signed)
History of essential hypertension with blood pressure measured today 110/78.  She is on amlodipine, lisinopril and metoprolol.

## 2019-09-25 NOTE — Progress Notes (Signed)
09/25/2019 [name]   06/20/1956  [id number]  Primary Physician Copland, Gay Filler, MD Primary Cardiologist: Lorretta Harp MD Garret Reddish, [place], [place]  HPI:  [name] is a 64 y.o.   who I last saw December of last year with a history of HTN, obesity, and hypothyroidism . I last saw her in the office  09/26/2018.She presented on 12/11/12 after a prolonged episode of angina. Her EKG was without acute changes, but she ruled in for NSTEMI. Her troponin peaked at 4.95. She was placed on IV Heparin and underwent diagnostic coronary angiography. Her initial cardiac catheterization on 5/11, performed by Dr. Ellyn Hack, demonstrated diffuse LAD disease with essentially subtotal occlusion that had the appearance of possible Spontaneous Coronary Artery Dissection. However, with existing CAD, simply diffuse disease was also possible. It was decided to not do any intervention at that time, but to wait and have the patient return, several days later, for a re-look cath to re-evaluate the LAD. She was maintained on medical therapy and had improvement in symtpoms. She returned to the cath lab on 5/16 for re-look. This was also performed by Dr. Ellyn Hack. She was noted to have progression of LAD disease to total occlusion at the original ~95% subtotal location with improved D1-distal LAD and R-L collaterals (septal and distal RPDA). The images were reviewd by Dr. Gwenlyn Found and Dr. Lia Foyer and it was concluded that the best course of action was not to proceed with extensive LAD PTCA-PCI, in the absence of on-going symptoms. Medical therapy was recommended. Since I saw her a year ago she's been doing well until this past July when she was admitted with unstable angina. She ruled out for myocardial infarction. She underwent cardiac catheterization by Dr. Ellyn Hack revealing an occluded LAD in the midportion and otherwise minimal CAD. She did have a 90% small ostial stent second marginal branch stenosis and  60% PLA stenosis with what appeared to be fairly well preserved LV function. She had an apical wall motion abnormality. Since I saw her a year ago she saw remained currently stable specifically denying chest pain or shortness of breath. Her major complaint is of chronic fatigue and lack of energy.She retired from working as a [employer] on 08/01/17 and seems much happier.  Since I saw her a year ago she has developed significant dyspnea on exertion but really denies chest pain.  She is on Imdur and ranolazine.  She is a chronically occluded LAD, moderate diagonal branch, obtuse marginal branch and distal dominant RCA disease.  She has right to left collaterals.  I am to get a 2D echo to further evaluate and we will see her back in 1 month for follow-up.  She may ultimately require recatheterization.  Current Meds  Medication Sig  . ALPRAZolam (XANAX) 0.5 MG tablet TAKE 1 TABLET BY MOUTH 3 TIMES DAILY AS NEEDED FOR ANXIETY  . amLODipine (NORVASC) 5 MG tablet TAKE ONE TABLET BY MOUTH ONE TIME DAILY NEEDS OFFICE VISIT"  . aspirin EC 81 MG EC tablet Take 1 tablet (81 mg total) by mouth daily.  . clopidogrel (PLAVIX) 75 MG tablet take 1 tablet by mouth once daily with breakfast  . estradiol (ESTRACE) 1 MG tablet TAKE ONE TABLET BY MOUTH ONE TIME DAILY   . isosorbide mononitrate (IMDUR) 60 MG 24 hr tablet Take 1 tablet (60 mg total) by mouth daily. Please make annual appt with Dr. Gwenlyn Found for refills. [phone]. 1st attempt.  Marland Kitchen lisinopril (ZESTRIL) 5 MG tablet  Take 1 tablet (5 mg total) by mouth daily. Please make annual appt with Dr. Gwenlyn Found for refills. [phone]. 1st attempt.  . metoprolol tartrate (LOPRESSOR) 25 MG tablet TAKE 1 TABLET BY MOUTH TWICE A DAY **NEEDS OFFICE VISIT**  . nitroGLYCERIN (NITROSTAT) 0.4 MG SL tablet Place 1 tablet (0.4 mg total) under the tongue every 5 (five) minutes as needed for chest pain.  . pantoprazole (PROTONIX) 40 MG tablet Take 1 tablet (40 mg total) by mouth daily.  .  ranolazine (RANEXA) 1000 MG SR tablet TAKE ONE TABLET BY MOUTH TWICE DAILY  (Patient taking differently: Take 1,000 mg by mouth daily. )  . [DISCONTINUED] Evolocumab (REPATHA SURECLICK) [id number] MG/ML SOAJ Inject 140 mg into the skin every 14 (fourteen) days.  . [DISCONTINUED] guaiFENesin-codeine 100-10 MG/5ML syrup Take 10 mLs by mouth 3 (three) times daily as needed for cough.  . [DISCONTINUED] triamcinolone ointment (KENALOG) 0.5 % Apply 1 application topically 2 (two) times daily. Use for eczema behind ear as needed     No Known Allergies  Social History   Socioeconomic History  . Marital status: Single    Spouse name: Not on file  . Number of children: 2  . Years of education: Not on file  . Highest education level: Not on file  Occupational History  . Occupation: Drug enforcement    CommentPassenger transport manager  Tobacco Use  . Smoking status: Never Smoker  . Smokeless tobacco: Never Used  Substance and Sexual Activity  . Alcohol use: No  . Drug use: No  . Sexual activity: Never  Other Topics Concern  . Not on file  Social History Narrative   Daily caffeine use.   Social Determinants of Health   Financial Resource Strain:   . Difficulty of Paying Living Expenses: Not on file  Food Insecurity:   . Worried About Charity fundraiser in the Last Year: Not on file  . Ran Out of Food in the Last Year: Not on file  Transportation Needs:   . Lack of Transportation (Medical): Not on file  . Lack of Transportation (Non-Medical): Not on file  Physical Activity:   . Days of Exercise per Week: Not on file  . Minutes of Exercise per Session: Not on file  Stress:   . Feeling of Stress : Not on file  Social Connections:   . Frequency of Communication with Friends and Family: Not on file  . Frequency of Social Gatherings with Friends and Family: Not on file  . Attends Religious Services: Not on file  . Active Member of Clubs or Organizations: Not on file  . Attends Archivist  Meetings: Not on file  . Marital Status: Not on file  Intimate Partner Violence:   . Fear of Current or Ex-Partner: Not on file  . Emotionally Abused: Not on file  . Physically Abused: Not on file  . Sexually Abused: Not on file     Review of Systems: General: negative for chills, fever, night sweats or weight changes.  Cardiovascular: negative for chest pain, dyspnea on exertion, edema, orthopnea, palpitations, paroxysmal nocturnal dyspnea or shortness of breath Dermatological: negative for rash Respiratory: negative for cough or wheezing Urologic: negative for hematuria Abdominal: negative for nausea, vomiting, diarrhea, bright red blood per rectum, melena, or hematemesis Neurologic: negative for visual changes, syncope, or dizziness All other systems reviewed and are otherwise negative except as noted above.    Blood pressure 110/78, pulse 71, temperature 98.3 F (36.8 C), height 5'  3" (1.6 m), weight 212 lb (96.2 kg).  General appearance: alert and no distress Neck: no adenopathy, no carotid bruit, no JVD, supple, symmetrical, trachea midline and thyroid not enlarged, symmetric, no tenderness/mass/nodules Lungs: clear to auscultation bilaterally Heart: regular rate and rhythm, S1, S2 normal, no murmur, click, rub or gallop Extremities: extremities normal, atraumatic, no cyanosis or edema Pulses: 2+ and symmetric Skin: Skin color, texture, turgor normal. No rashes or lesions Neurologic: Alert and oriented X 3, normal strength and tone. Normal symmetric reflexes. Normal coordination and gait  EKG sinus rhythm at 71 without ST or T wave changes.  I personally reviewed this EKG.  ASSESSMENT AND PLAN:   Essential hypertension History of essential hypertension with blood pressure measured today 110/78.  She is on amlodipine, lisinopril and metoprolol.  Hyperlipidemia History of hyperlipidemia intolerant to statin therapy on Repatha briefly but this was discontinued because of  financial constraints.  Her last lipid profile performed 03/22/2019 revealed a total cholesterol of 161 with an LDL 62 on Repatha.  We will recheck a fasting lipid liver profile.  I am going refer her to Dr. Debara Pickett for further evaluation and treatment.  CAD (coronary artery disease), possible SCAD History of CAD with a known occluded LAD by cath last performed by Dr. Ellyn Hack 5/16.  It was decided not to intervene on this since this was potentially related to SCAD.  She currently denies chest pain but has noticed increasing dyspnea on exertion over the last several weeks.  I am to get a 2D echo to further evaluate.  She may need a coronary CTA as well      Lorretta Harp MD [facility], [facility] 09/25/2019 11:31 AM

## 2019-10-01 ENCOUNTER — Other Ambulatory Visit: Payer: Self-pay | Admitting: Cardiovascular Disease

## 2019-10-02 DIAGNOSIS — E782 Mixed hyperlipidemia: Secondary | ICD-10-CM | POA: Diagnosis not present

## 2019-10-03 DIAGNOSIS — E782 Mixed hyperlipidemia: Secondary | ICD-10-CM

## 2019-10-03 LAB — HEPATIC FUNCTION PANEL
ALT: 10 IU/L (ref 0–32)
AST: 11 IU/L (ref 0–40)
Albumin: 4 g/dL (ref 3.8–4.8)
Alkaline Phosphatase: 78 IU/L (ref 39–117)
Bilirubin Total: 0.2 mg/dL (ref 0.0–1.2)
Bilirubin, Direct: 0.09 mg/dL (ref 0.00–0.40)
Total Protein: 6.7 g/dL (ref 6.0–8.5)

## 2019-10-03 LAB — LIPID PANEL
Chol/HDL Ratio: 3.3 ratio (ref 0.0–4.4)
Cholesterol, Total: 236 mg/dL — ABNORMAL HIGH (ref 100–199)
HDL: 72 mg/dL (ref >39)
LDL Chol Calc (NIH): 138 mg/dL — ABNORMAL HIGH (ref 0–99)
Triglycerides: 150 mg/dL — ABNORMAL HIGH (ref 0–149)
VLDL Cholesterol Cal: 26 mg/dL (ref 5–40)

## 2019-10-09 ENCOUNTER — Other Ambulatory Visit (HOSPITAL_COMMUNITY): Payer: Federal, State, Local not specified - PPO

## 2019-10-10 ENCOUNTER — Other Ambulatory Visit: Payer: Self-pay

## 2019-10-10 ENCOUNTER — Ambulatory Visit (HOSPITAL_COMMUNITY): Payer: Federal, State, Local not specified - PPO | Attending: Cardiovascular Disease

## 2019-10-10 DIAGNOSIS — I10 Essential (primary) hypertension: Secondary | ICD-10-CM | POA: Diagnosis not present

## 2019-10-10 DIAGNOSIS — R0602 Shortness of breath: Secondary | ICD-10-CM | POA: Diagnosis not present

## 2019-10-12 ENCOUNTER — Telehealth: Payer: Self-pay

## 2019-10-12 DIAGNOSIS — I2510 Atherosclerotic heart disease of native coronary artery without angina pectoris: Secondary | ICD-10-CM

## 2019-10-12 DIAGNOSIS — I340 Nonrheumatic mitral (valve) insufficiency: Secondary | ICD-10-CM

## 2019-10-12 NOTE — Telephone Encounter (Signed)
Spoke to patient echo results given.Advised to repeat in 1 year.Advised to keep appointment with Dr.Berry 10/24/19 at 3:15 pm.

## 2019-10-24 ENCOUNTER — Ambulatory Visit: Payer: Federal, State, Local not specified - PPO | Admitting: Cardiovascular Disease

## 2019-10-24 ENCOUNTER — Other Ambulatory Visit: Payer: Self-pay

## 2019-10-24 ENCOUNTER — Encounter: Payer: Self-pay | Admitting: Cardiovascular Disease

## 2019-10-24 DIAGNOSIS — R0600 Dyspnea, unspecified: Secondary | ICD-10-CM | POA: Diagnosis not present

## 2019-10-24 DIAGNOSIS — R0609 Other forms of dyspnea: Secondary | ICD-10-CM | POA: Insufficient documentation

## 2019-10-24 NOTE — Progress Notes (Signed)
10/24/2019 [name]   1956-04-25  [id number]  Primary Physician Copland, Gay Filler, MD Primary Cardiologist: Lorretta Harp MD Garret Reddish, [place], [place]  HPI:  [name] is a 64 y.o.  who I last saw December of last year with a history of HTN, obesity, and hypothyroidism . I last saw her in the office  09/25/2019.She presented on 12/11/12 after a prolonged episode of angina. Her EKG was without acute changes, but she ruled in for NSTEMI. Her troponin peaked at 4.95. She was placed on IV Heparin and underwent diagnostic coronary angiography. Her initial cardiac catheterization on 5/11, performed by Dr. Ellyn Hack, demonstrated diffuse LAD disease with essentially subtotal occlusion that had the appearance of possible Spontaneous Coronary Artery Dissection. However, with existing CAD, simply diffuse disease was also possible. It was decided to not do any intervention at that time, but to wait and have the patient return, several days later, for a re-look cath to re-evaluate the LAD. She was maintained on medical therapy and had improvement in symtpoms. She returned to the cath lab on 5/16 for re-look. This was also performed by Dr. Ellyn Hack. She was noted to have progression of LAD disease to total occlusion at the original ~95% subtotal location with improved D1-distal LAD and R-L collaterals (septal and distal RPDA). The images were reviewd by Dr. Gwenlyn Found and Dr. Lia Foyer and it was concluded that the best course of action was not to proceed with extensive LAD PTCA-PCI, in the absence of on-going symptoms. Medical therapy was recommended. Since I saw her a year ago she's been doing well until this past July when she was admitted with unstable angina. She ruled out for myocardial infarction. She underwent cardiac catheterization by Dr. Ellyn Hack revealing an occluded LAD in the midportion and otherwise minimal CAD. She did have a 90% small ostial stent second marginal branch stenosis and  60% PLA stenosis with what appeared to be fairly well preserved LV function. She had an apical wall motion abnormality. Since I saw her a year ago she saw remained currently stable specifically denying chest pain or shortness of breath. Her major complaint is of chronic fatigue and lack of energy.She retired from working as a [employer] on 08/01/17 and seems much happier.  She has developed significant dyspnea over the last several months on exertion but really denies chest pain.  She is on Imdur and ranolazine.  She is a chronically occluded LAD, moderate diagonal branch, obtuse marginal branch and distal dominant RCA disease.  She has right to left collaterals.  I obtained a 2D echocardiogram on her 10/11/2019 reviewed which revealed an EF of 50 to 55% with focal wall motion abnormalities in the LAD territory, mild to moderate MR.  Since changing her walking patterns to no longer walking up inclines her shortness of breath is somewhat improved.  Current Meds  Medication Sig  . ALPRAZolam (XANAX) 0.5 MG tablet TAKE 1 TABLET BY MOUTH 3 TIMES DAILY AS NEEDED FOR ANXIETY  . amLODipine (NORVASC) 5 MG tablet TAKE ONE TABLET BY MOUTH ONE TIME DAILY   . aspirin EC 81 MG EC tablet Take 1 tablet (81 mg total) by mouth daily.  . clopidogrel (PLAVIX) 75 MG tablet take 1 tablet by mouth once daily with breakfast  . estradiol (ESTRACE) 1 MG tablet TAKE ONE TABLET BY MOUTH ONE TIME DAILY   . isosorbide mononitrate (IMDUR) 60 MG 24 hr tablet TAKE ONE TABLET BY MOUTH ONE TIME DAILY *office visit needed  for further refills*  . lisinopril (ZESTRIL) 5 MG tablet TAKE ONE TABLET BY MOUTH ONE TIME DAILY *office visit needed for further refills*  . metoprolol tartrate (LOPRESSOR) 25 MG tablet TAKE 1 TABLET BY MOUTH TWICE A DAY **NEEDS OFFICE VISIT**  . nitroGLYCERIN (NITROSTAT) 0.4 MG SL tablet Place 1 tablet (0.4 mg total) under the tongue every 5 (five) minutes as needed for chest pain.  . pantoprazole (PROTONIX) 40 MG tablet  Take 1 tablet (40 mg total) by mouth daily.  . ranolazine (RANEXA) 1000 MG SR tablet TAKE ONE TABLET BY MOUTH TWICE DAILY  (Patient taking differently: Take 1,000 mg by mouth daily. )     No Known Allergies  Social History   Socioeconomic History  . Marital status: Single    Spouse name: Not on file  . Number of children: 2  . Years of education: Not on file  . Highest education level: Not on file  Occupational History  . Occupation: Drug enforcement    CommentPassenger transport manager  Tobacco Use  . Smoking status: Never Smoker  . Smokeless tobacco: Never Used  Substance and Sexual Activity  . Alcohol use: No  . Drug use: No  . Sexual activity: Never  Other Topics Concern  . Not on file  Social History Narrative   Daily caffeine use.   Social Determinants of Health   Financial Resource Strain:   . Difficulty of Paying Living Expenses:   Food Insecurity:   . Worried About Charity fundraiser in the Last Year:   . Arboriculturist in the Last Year:   Transportation Needs:   . Film/video editor (Medical):   Marland Kitchen Lack of Transportation (Non-Medical):   Physical Activity:   . Days of Exercise per Week:   . Minutes of Exercise per Session:   Stress:   . Feeling of Stress :   Social Connections:   . Frequency of Communication with Friends and Family:   . Frequency of Social Gatherings with Friends and Family:   . Attends Religious Services:   . Active Member of Clubs or Organizations:   . Attends Archivist Meetings:   Marland Kitchen Marital Status:   Intimate Partner Violence:   . Fear of Current or Ex-Partner:   . Emotionally Abused:   Marland Kitchen Physically Abused:   . Sexually Abused:      Review of Systems: General: negative for chills, fever, night sweats or weight changes.  Cardiovascular: negative for chest pain, dyspnea on exertion, edema, orthopnea, palpitations, paroxysmal nocturnal dyspnea or shortness of breath Dermatological: negative for rash Respiratory: negative  for cough or wheezing Urologic: negative for hematuria Abdominal: negative for nausea, vomiting, diarrhea, bright red blood per rectum, melena, or hematemesis Neurologic: negative for visual changes, syncope, or dizziness All other systems reviewed and are otherwise negative except as noted above.    Blood pressure 120/70, pulse 68, height 5\' 3"  (1.6 m), weight 210 lb (95.3 kg).  General appearance: alert and no distress Neck: no adenopathy, no carotid bruit, no JVD, supple, symmetrical, trachea midline and thyroid not enlarged, symmetric, no tenderness/mass/nodules Lungs: clear to auscultation bilaterally Heart: regular rate and rhythm, S1, S2 normal, no murmur, click, rub or gallop Extremities: extremities normal, atraumatic, no cyanosis or edema Pulses: 2+ and symmetric Skin: Skin color, texture, turgor normal. No rashes or lesions Neurologic: Alert and oriented X 3, normal strength and tone. Normal symmetric reflexes. Normal coordination and gait  EKG not performed today  ASSESSMENT AND  PLAN:   Dyspnea on exertion [name] returns today for follow-up of her 2D echo performed because of increasing dyspnea on exertion which she has experienced over the last several months principally walking up inclines.  She denies chest pain.  2D echo performed 10/10/2019 revealed an EF of 50 to 55% with focal wall motion abnormalities, mild to moderate MR.  She did have severe hypokinesia kinesia of the mid anteroseptal wall.  Since changing her walking pattern and walking only on flat surfaces her shortness of breath has improved.  At this point, I do not feel inclined to pursue coronary angiography.  This was last done by Dr. Ellyn Hack 02/06/2015 revealing occluded LAD and moderate circumflex disease.      Lorretta Harp MD FACP,FACC,[facility], [facility] 10/24/2019 10:40 AM

## 2019-10-24 NOTE — Assessment & Plan Note (Signed)
Ms. [name] returns today for follow-up of her 2D echo performed because of increasing dyspnea on exertion which she has experienced over the last several months principally walking up inclines.  She denies chest pain.  2D echo performed 10/10/2019 revealed an EF of 50 to 55% with focal wall motion abnormalities, mild to moderate MR.  She did have severe hypokinesia kinesia of the mid anteroseptal wall.  Since changing her walking pattern and walking only on flat surfaces her shortness of breath has improved.  At this point, I do not feel inclined to pursue coronary angiography.  This was last done by Dr. Ellyn Hack 02/06/2015 revealing occluded LAD and moderate circumflex disease.

## 2019-10-24 NOTE — Patient Instructions (Signed)
Medication Instructions:  Your physician recommends that you continue on your current medications as directed. Please refer to the Current Medication list given to you today.  *If you need a refill on your cardiac medications before your next appointment, please call your pharmacy*    Testing/Procedures:  Your physician has requested that you have an echocardiogram>scheduled to be done on 10/13/20. Echocardiography is a painless test that uses sound waves to create images of your heart. It provides your doctor with information about the size and shape of your heart and how well your heart's chambers and valves are working. This procedure takes approximately one hour. There are no restrictions for this procedure.  Follow-Up: At [facility], you and your health needs are our priority.  As part of our continuing mission to provide you with exceptional heart care, we have created designated Provider Care Teams.  These Care Teams include your primary Cardiologist (physician) and Advanced Practice Providers (APPs -  Physician Assistants and Nurse Practitioners) who all work together to provide you with the care you need, when you need it.  We recommend signing up for the patient portal called "MyChart".  Sign up information is provided on this After Visit Summary.  MyChart is used to connect with patients for Virtual Visits (Telemedicine).  Patients are able to view lab/test results, encounter notes, upcoming appointments, etc.  Non-urgent messages can be sent to your provider as well.   To learn more about what you can do with MyChart, go to [email or web address].    Your next appointment:   We request that you follow-up in: 6 months (In Person) with an extender and in 12 months (In Person) with Dr Andria Rhein will receive a reminder letter in the mail two months in advance. If you don't receive a letter, please call our office to schedule the follow-up appointment.

## 2019-11-01 ENCOUNTER — Other Ambulatory Visit: Payer: Self-pay | Admitting: Cardiovascular Disease

## 2019-11-02 ENCOUNTER — Encounter (INDEPENDENT_AMBULATORY_CARE_PROVIDER_SITE_OTHER): Payer: Self-pay

## 2019-11-02 ENCOUNTER — Other Ambulatory Visit: Payer: Self-pay

## 2019-11-02 ENCOUNTER — Encounter: Payer: Self-pay | Admitting: Internal Medicine

## 2019-11-02 ENCOUNTER — Ambulatory Visit: Payer: Federal, State, Local not specified - PPO | Admitting: Internal Medicine

## 2019-11-02 ENCOUNTER — Telehealth: Payer: Self-pay | Admitting: Internal Medicine

## 2019-11-02 VITALS — BP 100/70 | HR 69 | Temp 97.3°F | Ht 63.0 in | Wt 207.2 lb

## 2019-11-02 DIAGNOSIS — E782 Mixed hyperlipidemia: Secondary | ICD-10-CM

## 2019-11-02 DIAGNOSIS — I340 Nonrheumatic mitral (valve) insufficiency: Secondary | ICD-10-CM | POA: Diagnosis not present

## 2019-11-02 DIAGNOSIS — M791 Myalgia, unspecified site: Secondary | ICD-10-CM | POA: Diagnosis not present

## 2019-11-02 DIAGNOSIS — I2510 Atherosclerotic heart disease of native coronary artery without angina pectoris: Secondary | ICD-10-CM

## 2019-11-02 DIAGNOSIS — T466X5A Adverse effect of antihyperlipidemic and antiarteriosclerotic drugs, initial encounter: Secondary | ICD-10-CM

## 2019-11-02 MED ORDER — REPATHA SURECLICK 140 MG/ML ~~LOC~~ SOAJ: 1.0000 | SUBCUTANEOUS | 11 refills | Status: DC

## 2019-11-02 NOTE — Telephone Encounter (Signed)
Spoke with [place] about Repatha  Patient's medication is NOT covered at [employer] She was advised to contact her insurance company to figure out which pharmacy she can use

## 2019-11-02 NOTE — Telephone Encounter (Signed)
Spoke with patient who advised to send Rx to [facility] in [employer] [place] Rx(s) sent to pharmacy electronically.

## 2019-11-02 NOTE — Patient Instructions (Signed)
Medication Instructions:  [place] prescription has been sent to [facility]. If a new prior authorization is needed, they will contact our office.   If you need co-pay assistance, please look into the program at [email or web address] >> disease funds >> hypercholesterolemia. This is an online application or you can call to complete.    Lab Work: FASTING lab work in 3-4 months to check cholesterol   If you have labs (blood work) drawn today and your tests are completely normal, you will receive your results only by: Marland Kitchen MyChart Message (if you have MyChart) OR . A paper copy in the mail If you have any lab test that is abnormal or we need to change your treatment, we will call you to review the results.   Testing/Procedures: NONE   Follow-Up: At [facility], you and your health needs are our priority.  As part of our continuing mission to provide you with exceptional heart care, we have created designated Provider Care Teams.  These Care Teams include your primary Cardiologist (physician) and Advanced Practice Providers (APPs -  Physician Assistants and Nurse Practitioners) who all work together to provide you with the care you need, when you need it.  We recommend signing up for the patient portal called "MyChart".  Sign up information is provided on this After Visit Summary.  MyChart is used to connect with patients for Virtual Visits (Telemedicine).  Patients are able to view lab/test results, encounter notes, upcoming appointments, etc.  Non-urgent messages can be sent to your provider as well.   To learn more about what you can do with MyChart, go to [email or web address].    Your next appointment:   3-4 month(s) - lipid clinic  The format for your next appointment:   Either In Person or Virtual  Provider:   K. Mali Hilty, MD   Other Instructions

## 2019-11-02 NOTE — Telephone Encounter (Signed)
Follow Up:   Pt called and said her insurance denied the [place].

## 2019-11-02 NOTE — Progress Notes (Signed)
LIPID CLINIC CONSULT NOTE  Chief Complaint:  Manage dyslipidemia  Primary Care Physician: Sabrina Mclean, MD  Primary Cardiologist:  Sabrina Burow, MD  HPI:  [name] is a 64 y.o. female who is being seen today for the evaluation of dyslipidemia at the request of Copland, Gay Filler, MD.  This is a pleasant 64 year old female patient of [name] with a history of coronary artery disease and occluded LAD as well as mitral valvular disease.  She has had persistent dyslipidemia and unfortunately statin intolerance.  Most recently her lipids show total cholesterol 236, triglycerides 150, HDL 72 and LDL 138.  Her target LDL is less than 70.  She was approved for Repatha and took it for a brief period of time however was getting it via mail order which was cumbersome and the cost was about $70 a month which was not affordable for her.  Ultimately she discontinued it.  She is here today to discuss additional options.  I do think that a PCSK9 inhibitor is the best option for her.  Other options could be ezetimibe, Nexletol or other therapies however cardiovascular risk reduction is not a significant or unknown.  PMHx:  Past Medical History:  Diagnosis Date  . Angina effort 05/22/2013  . Anxiety   . CAD (coronary artery disease), possible SCAD 12/14/2012  . Colon polyps   . Duodenitis   . Dyspnea on exertion    LEXISCAN, 09/30/2008 - normal, EKG negative for ischemia, no ECG changes  . Esophageal stricture   . Gastritis   . GERD (gastroesophageal reflux disease)   . Gout    "only from RX given after MI" (01/22/2013)  . H/O hiatal hernia   . History of esophageal stricture   . Hypertension    2D ECHO, 09/30/2008 - EF >55%, normal: RENAL DOPPLER, 03/21/2007 - normal duplex study  . Hypothyroidism   . IBS (irritable bowel syndrome)   . Ischemic colitis ([place])   . Lower GI bleeding    10/07/11 "I've had 3 episodes in the past year"  . Myocardial infarction [facility]) 12/10/2012    "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)  . Tubular adenoma polyp of rectum 2008    Past Surgical History:  Procedure Laterality Date  . ABDOMINAL HYSTERECTOMY  1999  . ANTERIOR LUMBAR FUSION  2010   "L4-5" (01/22/2013)  . BACK SURGERY  09/2008   FUSION/l4-5  . CARDIAC CATHETERIZATION N/A 02/06/2015   Procedure: Left Heart Cath and Coronary Angiography;  Surgeon: Leonie Man, MD;  Location: [place] CV LAB;  Service: Cardiovascular;  Laterality: N/A;  . ESOPHAGEAL DILATION  08/2010  . ESOPHAGEAL DILATION     "once or twice" (01/22/2013)  . HAMMER TOE SURGERY Bilateral ~ 2002  . LEFT HEART CATHETERIZATION WITH CORONARY ANGIOGRAM N/A 12/11/2012   Procedure: LEFT HEART CATHETERIZATION WITH CORONARY ANGIOGRAM;  Surgeon: Leonie Man, MD;  [facility];  Service: Cardiovascular;  Laterality: N/A;  . LEFT HEART CATHETERIZATION WITH CORONARY ANGIOGRAM N/A 12/15/2012   Procedure: LEFT HEART CATHETERIZATION WITH CORONARY ANGIOGRAM;  Surgeon: Leonie Man, MD;  Location: [facility] CATH LAB;  Service: Cardiovascular;  Laterality: N/A;  . PERIPHERAL VENOUS STUDY  10/03/2008   No evidence of DVT, superficial thrombosis, or Baker's cyst  . TONSILLECTOMY AND ADENOIDECTOMY  1960's  . TUBAL LIGATION  1985    FAMHx:  Family History  Problem Relation Age of Onset  . Colon cancer Brother   . Kidney disease Mother   .  Stroke Father   . Multiple myeloma Sister   . Heart attack Brother   . Other Brother   . Other Brother     SOCHx:   reports that she has never smoked. She has never used smokeless tobacco. She reports that she does not drink alcohol or use drugs.  ALLERGIES:  No Known Allergies  ROS: Pertinent items noted in HPI and remainder of comprehensive ROS otherwise negative.  HOME MEDS: Current Outpatient Medications on File Prior to Visit  Medication Sig Dispense Refill  . ALPRAZolam (XANAX) 0.5 MG tablet TAKE 1 TABLET BY MOUTH 3 TIMES DAILY AS NEEDED FOR ANXIETY  90 tablet 2  . amLODipine (NORVASC) 5 MG tablet TAKE ONE TABLET BY MOUTH ONE TIME DAILY  90 tablet 1  . aspirin EC 81 MG EC tablet Take 1 tablet (81 mg total) by mouth daily.    . clopidogrel (PLAVIX) 75 MG tablet TAKE ONE TABLET BY MOUTH ONE TIME DAILY  WITH BREAKFAST 90 tablet 1  . estradiol (ESTRACE) 1 MG tablet TAKE ONE TABLET BY MOUTH ONE TIME DAILY  30 tablet 5  . isosorbide mononitrate (IMDUR) 60 MG 24 hr tablet TAKE ONE TABLET BY MOUTH ONE TIME DAILY *office visit needed for further refills* 90 tablet 3  . lisinopril (ZESTRIL) 5 MG tablet TAKE ONE TABLET BY MOUTH ONE TIME DAILY *office visit needed for further refills* 90 tablet 3  . metoprolol tartrate (LOPRESSOR) 25 MG tablet TAKE 1 TABLET BY MOUTH TWICE A DAY **NEEDS OFFICE VISIT** 60 tablet 4  . nitroGLYCERIN (NITROSTAT) 0.4 MG SL tablet Place 1 tablet (0.4 mg total) under the tongue every 5 (five) minutes as needed for chest pain. 25 tablet 5  . pantoprazole (PROTONIX) 40 MG tablet Take 1 tablet (40 mg total) by mouth daily. 90 tablet 3  . ranolazine (RANEXA) 500 MG 12 hr tablet Take 1,000 mg by mouth once.     No current facility-administered medications on file prior to visit.    LABS/IMAGING: No results found for this or any previous visit (from the past 48 hour(s)). No results found.  LIPID PANEL:    Component Value Date/Time   CHOL 236 (H) 10/02/2019 0850   TRIG 150 (H) 10/02/2019 0850   HDL 72 10/02/2019 0850   CHOLHDL 3.3 10/02/2019 0850   CHOLHDL 3 03/22/2019 0839   VLDL 35.8 03/22/2019 0839   LDLCALC 138 (H) 10/02/2019 0850   LDLDIRECT 128.0 01/12/2017 1455    WEIGHTS: Wt Readings from Last 3 Encounters:  11/02/19 207 lb 3.2 oz (94 kg)  10/24/19 210 lb (95.3 kg)  09/25/19 212 lb (96.2 kg)    VITALS: BP 100/70   Pulse 69   Temp (!) 97.3 F (36.3 C)   Ht '5\' 3"'$  (1.6 m)   Wt 207 lb 3.2 oz (94 kg)   SpO2 96%   BMI 36.70 kg/m   EXAM: Deferred  EKG: Deferred  ASSESSMENT: 1. Mixed dyslipidemia,  goal LDL less than 70 2. Statin intolerance 3. Coronary artery disease with occluded LAD 4. Mitral valve disease  PLAN: 1.   [name] has mixed dyslipidemia with target LDL less than 70 and unfortunately cannot tolerate statins.  She is currently on no therapy.  She continues to struggle with fatigue which I do not think is related to her dyslipidemia.  She should tolerate Repatha well in fact she had previously and her LDL was down in the 60s.  We will like to resubmit this to her current  pharmacy to try to avoid a mail order situation.  Hopefully the cost will be less.  She may not be able to use the health well foundation however we will direct her to apply.  Plan repeat lipids after 3 months of therapy if we can reestablish it.  If we are not able to successfully get her on that then will consider other options.  Thanks for the kind referral.  Pixie Casino, MD, FACC, [place] Director of the Advanced Lipid Disorders &  Cardiovascular Risk Reduction Clinic Diplomate of the American Board of Clinical Lipidology Attending Cardiologist  Direct Dial: [phone]  Fax: [phone]  Website:  [email or web address] 11/02/2019, 10:55 AM

## 2019-11-09 ENCOUNTER — Other Ambulatory Visit: Payer: Self-pay

## 2019-11-13 ENCOUNTER — Other Ambulatory Visit: Payer: Self-pay | Admitting: Internal Medicine

## 2019-11-13 MED ORDER — REPATHA SURECLICK 140 MG/ML ~~LOC~~ SOAJ: 1.0000 | SUBCUTANEOUS | 3 refills | Status: DC

## 2019-11-13 NOTE — Telephone Encounter (Signed)
Repatha Sure[facility] refilled to [facility]

## 2019-12-04 DIAGNOSIS — L218 Other seborrheic dermatitis: Secondary | ICD-10-CM | POA: Diagnosis not present

## 2019-12-04 DIAGNOSIS — L578 Other skin changes due to chronic exposure to nonionizing radiation: Secondary | ICD-10-CM | POA: Diagnosis not present

## 2019-12-04 DIAGNOSIS — W908XXS Exposure to other nonionizing radiation, sequela: Secondary | ICD-10-CM | POA: Diagnosis not present

## 2019-12-04 DIAGNOSIS — L2089 Other atopic dermatitis: Secondary | ICD-10-CM | POA: Diagnosis not present

## 2019-12-29 ENCOUNTER — Other Ambulatory Visit: Payer: Self-pay | Admitting: Cardiovascular Disease

## 2020-01-30 ENCOUNTER — Encounter: Payer: Self-pay | Admitting: Internal Medicine

## 2020-01-30 ENCOUNTER — Other Ambulatory Visit: Payer: Self-pay | Admitting: Cardiovascular Disease

## 2020-01-30 ENCOUNTER — Ambulatory Visit: Payer: Federal, State, Local not specified - PPO | Admitting: Internal Medicine

## 2020-01-30 VITALS — BP 128/68 | HR 66 | Ht 63.0 in | Wt 209.0 lb

## 2020-01-30 DIAGNOSIS — R159 Full incontinence of feces: Secondary | ICD-10-CM | POA: Diagnosis not present

## 2020-01-30 DIAGNOSIS — K219 Gastro-esophageal reflux disease without esophagitis: Secondary | ICD-10-CM

## 2020-01-30 DIAGNOSIS — K222 Esophageal obstruction: Secondary | ICD-10-CM | POA: Diagnosis not present

## 2020-01-30 DIAGNOSIS — Z86010 Personal history of colonic polyps: Secondary | ICD-10-CM | POA: Diagnosis not present

## 2020-01-30 NOTE — Patient Instructions (Signed)
If you are age 64 or older, your body mass index should be between 23-30. Your Body mass index is 37.02 kg/m. If this is out of the aforementioned range listed, please consider follow up with your Primary Care Provider.  If you are age 16 or younger, your body mass index should be between 19-25. Your Body mass index is 37.02 kg/m. If this is out of the aformentioned range listed, please consider follow up with your Primary Care Provider.     Take two tablespoons of Citrucel daily in water or juice

## 2020-01-30 NOTE — Progress Notes (Signed)
HISTORY OF PRESENT ILLNESS:  [name] is a 64 y.o. female who scheduled this appointment regarding fecal incontinence.  She has been followed in this office for GERD complicated by peptic stricture requiring esophageal dilation, but history of adenomatous colon polyps as well as a family history of colon cancer, alternating IBS, and a possible history of ischemic colitis.  She was last seen via telehealth medicine in May 2020.    He tells me that she has had a 2-year history of fecal leakage.  No full incontinence.  She does wear protective pad.  She has had 2 prior vaginal deliveries with traumatic tear.  She describes her bowel habits as alternating between constipation and diarrhea.  She will use occasional MiraLAX for constipation and Imodium for diarrhea.  She denies bleeding.  No abdominal pain.  Terms of GERD, she is on pantoprazole.  She does not feel this works as well as omeprazole.  No dysphagia.  She does have bloating.  Review of blood work from March 2021 shows normal liver tests.  The last upper endoscopy was esophageal dilation August 2019.  Her last complete colonoscopy was completed August 2019.  REVIEW OF SYSTEMS:  All non-GI ROS negative unless otherwise stated in the HPI except for anxiety  Past Medical History:  Diagnosis Date  . Angina effort 05/22/2013  . Anxiety   . CAD (coronary artery disease), possible SCAD 12/14/2012  . Colon polyps   . Duodenitis   . Dyspnea on exertion    LEXISCAN, 09/30/2008 - normal, EKG negative for ischemia, no ECG changes  . Esophageal stricture   . Gastritis   . GERD (gastroesophageal reflux disease)   . Gout    "only from RX given after MI" (01/22/2013)  . H/O hiatal hernia   . History of esophageal stricture   . Hypertension    2D ECHO, 09/30/2008 - EF >55%, normal: RENAL DOPPLER, 03/21/2007 - normal duplex study  . Hypothyroidism   . IBS (irritable bowel syndrome)   . Ischemic colitis ([place])   . Lower GI bleeding    10/07/11  "I've had 3 episodes in the past year"  . Myocardial infarction [facility]) 12/10/2012   "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)  . Tubular adenoma polyp of rectum 2008    Past Surgical History:  Procedure Laterality Date  . ABDOMINAL HYSTERECTOMY  1999  . ANTERIOR LUMBAR FUSION  2010   "L4-5" (01/22/2013)  . BACK SURGERY  09/2008   FUSION/l4-5  . CARDIAC CATHETERIZATION N/A 02/06/2015   Procedure: Left Heart Cath and Coronary Angiography;  Surgeon: Leonie Man, MD;  Location: [place] [facility] LAB;  Service: Cardiovascular;  Laterality: N/A;  . ESOPHAGEAL DILATION  08/2010  . ESOPHAGEAL DILATION     "once or twice" (01/22/2013)  . HAMMER TOE SURGERY Bilateral ~ 2002  . LEFT HEART CATHETERIZATION WITH CORONARY ANGIOGRAM N/A 12/11/2012   Procedure: LEFT HEART CATHETERIZATION WITH CORONARY ANGIOGRAM;  Surgeon: Leonie Man, MD;  Location: [facility] CATH LAB;  Service: Cardiovascular;  Laterality: N/A;  . LEFT HEART CATHETERIZATION WITH CORONARY ANGIOGRAM N/A 12/15/2012   Procedure: LEFT HEART CATHETERIZATION WITH CORONARY ANGIOGRAM;  Surgeon: Leonie Man, MD;  Location: [facility] CATH LAB;  Service: Cardiovascular;  Laterality: N/A;  . PERIPHERAL VENOUS STUDY  10/03/2008   No evidence of DVT, superficial thrombosis, or Baker's cyst  . TONSILLECTOMY AND ADENOIDECTOMY  1960's  . [place]    Social History LYNZEE LINDQUIST  reports that she has never  smoked. She has never used smokeless tobacco. She reports that she does not drink alcohol and does not use drugs.  family history includes Colon cancer (age of onset: 56) in her brother; Heart attack in her brother; Kidney disease in her mother; Multiple myeloma in her sister; Other in her brother and brother; Stroke in her father.  No Known Allergies     PHYSICAL EXAMINATION: Vital signs: BP 128/68   Pulse 66   Ht _0  (1.6 m)   Wt 209 lb (94.8 kg)   SpO2 96%   BMI 37.02 kg/m   Constitutional: generally well-appearing, no  acute distress Psychiatric: alert and oriented x3, cooperative Eyes: extraocular movements intact, anicteric, conjunctiva pink Mouth: oral pharynx moist, no lesions Neck: supple no lymphadenopathy Cardiovascular: heart regular rate and rhythm, no murmur Lungs: clear to auscultation bilaterally Abdomen: soft, nontender, nondistended, no obvious ascites, no peritoneal signs, normal bowel sounds, no organomegaly Rectal: Omitted Extremities: no clubbing, cyanosis, or lower extremity edema bilaterally Skin: no lesions on visible extremities Neuro: No focal deficits.  Cranial nerves intact  ASSESSMENT:  1.  Fecal leakage intermittently for at least 2 years 2.  History of adenomatous colon polyps.  Last surveillance colonoscopy August 2019 3.  GERD complicated by peptic stricture.  Occasional breakthrough symptoms on pantoprazole.  No recurrent dysphagia 4.  Obesity 5.  Irritable bowel syndrome with alternating bowel habits  PLAN:  1.  Citrucel 2 stable symptoms daily 2 improve consistency of bowel habits and hopefully reduce frequency and severity of fecal leakage 2.  Continue to wear protective pads 3.  Reflux precautions 4.  Weight loss 5.  Continue pantoprazole 6.  Routine surveillance colonoscopy around 2024 7.  Contact the office in the interim for questions or problems.  Otherwise routine office follow-up in 1 year regarding management of chronic GERD A total time of 30 minutes was spent.  See the patient, reviewing outside test, obtaining comprehensive history and performing comprehensive medical exam, counseling the patient regarding her above listed issues, directing medical therapy, and documenting information in the clinical health record

## 2020-01-31 ENCOUNTER — Ambulatory Visit: Payer: Federal, State, Local not specified - PPO | Admitting: Physician Assistant

## 2020-01-31 ENCOUNTER — Encounter: Payer: Self-pay | Admitting: Physician Assistant

## 2020-01-31 ENCOUNTER — Other Ambulatory Visit: Payer: Self-pay

## 2020-01-31 VITALS — BP 110/68 | HR 68 | Ht 63.0 in | Wt 210.0 lb

## 2020-01-31 DIAGNOSIS — I2510 Atherosclerotic heart disease of native coronary artery without angina pectoris: Secondary | ICD-10-CM

## 2020-01-31 DIAGNOSIS — I10 Essential (primary) hypertension: Secondary | ICD-10-CM

## 2020-01-31 DIAGNOSIS — E782 Mixed hyperlipidemia: Secondary | ICD-10-CM

## 2020-01-31 MED ORDER — ISOSORBIDE MONONITRATE ER 60 MG PO TB24: 60.0000 mg | ORAL_TABLET | Freq: Every day | ORAL | 3 refills | Status: DC

## 2020-01-31 MED ORDER — LISINOPRIL 5 MG PO TABS: 5.0000 mg | ORAL_TABLET | Freq: Every day | ORAL | 3 refills | Status: DC

## 2020-01-31 MED ORDER — NITROGLYCERIN 0.4 MG SL SUBL: 0.4000 mg | SUBLINGUAL_TABLET | SUBLINGUAL | 3 refills | Status: DC | PRN

## 2020-01-31 MED ORDER — METOPROLOL TARTRATE 25 MG PO TABS: 25.0000 mg | ORAL_TABLET | Freq: Two times a day (BID) | ORAL | 3 refills | Status: DC

## 2020-01-31 NOTE — Progress Notes (Signed)
Cardiology Office Note:    Date:  02/01/2020   ID:  [name], DOB 1956-03-22, MRN [id number]  PCP:  Darreld Mclean, MD  [facility] Cardiologist:  Quay Burow, MD  [place] Electrophysiologist:  None   Referring MD: Darreld Mclean, MD   Chief Complaint  Patient presents with  . Follow-up    seen for Dr. Gwenlyn Found    History of Present Illness:    [name] is a 64 y.o. female with a hx of HTN, obesity, hypothyroidism and CAD. Her initial cardiac catheterization in 12/10/2012 performed by Dr. Ellyn Hack demonstrated diffuse LAD disease with essentially subtotal occlusion that had the appearance of possible spontaneous coronary artery dissection, however with existing CAD, single diffuse disease was also possible. It was decided not to pursue any intervention at the time but to wait and have patient returned several days later for relook cath to reevaluate the LAD. She returned to the cath lab on 12/15/2012 for relook study, she was noted to have progression of LAD disease to total occlusion at the original 95% subtotal location with improved D1 to diagonal LAD and right to left collaterals. The image was reviewed with the interventional team and concluded the best course of option was not proceeded with extensive LAD PTCA-PCI in the absence of ongoing symptom. Medical therapy was recommended. Last cardiac catheterization performed on 02/06/2015 showed total occlusion of entire mid to distal LAD, with faint collaterals from left to left and right-to-left, 90% ostial first septal lesion, 35% mid RCA, 45% distal RCA, 60% OM lesion. Medical therapy was recommended.  Echocardiogram obtained on 10/10/2019 showed EF 50 to 55%, moderate asymmetric LVH, severe hypokinesis in the mid anteroseptal wall consistent with LAD territory, normal RVEF, mild to moderate MR.  Patient was last seen by Dr. Gwenlyn Found in March 2021 at which time her shortness of breath has improved with walking.   Patient was seen by Dr. Debara Pickett in April 2021 to help manage cholesterol.   Patient presents today for cardiology follow-up.  Her dyspnea is very much stable in this case.  She denies any recent chest pain.  Her previous anginal symptom associated with shoulder pain which has not occurred recently.  She is on amlodipine, Imdur, metoprolol and Ranexa for antianginal purposes.  She is planning to visit [place] in September for about 2 weeks.  She is not planning to do much strenuous activity and likely take the train ride in [place].  This plan has been delayed for roughly 3 years now.  I think she is stable at this time to do some traveling as long as she does not over exert herself.  She is not fasting today, will need fasting lipid panel this month as she has been compliant with Repatha for the past 3 months.  She can see Dr. Debara Pickett afterward to help manage cholesterol.  Otherwise she is due for repeat echocardiogram in next March and follow-up with Dr. Gwenlyn Found after that.    Past Medical History:  Diagnosis Date  . Angina effort 05/22/2013  . Anxiety   . CAD (coronary artery disease), possible SCAD 12/14/2012  . Colon polyps   . Duodenitis   . Dyspnea on exertion    LEXISCAN, 09/30/2008 - normal, EKG negative for ischemia, no ECG changes  . Esophageal stricture   . Gastritis   . GERD (gastroesophageal reflux disease)   . Gout    "only from RX given after MI" (01/22/2013)  . H/O hiatal hernia   .  History of esophageal stricture   . Hypertension    2D ECHO, 09/30/2008 - EF >55%, normal: RENAL DOPPLER, 03/21/2007 - normal duplex study  . Hypothyroidism   . IBS (irritable bowel syndrome)   . Ischemic colitis ([place])   . Lower GI bleeding    10/07/11 "I've had 3 episodes in the past year"  . Myocardial infarction [facility]) 12/10/2012   "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)  . Tubular adenoma polyp of rectum 2008    Past Surgical History:  Procedure Laterality Date  . ABDOMINAL HYSTERECTOMY   1999  . ANTERIOR LUMBAR FUSION  2010   "L4-5" (01/22/2013)  . BACK SURGERY  09/2008   FUSION/l4-5  . CARDIAC CATHETERIZATION N/A 02/06/2015   Procedure: Left Heart Cath and Coronary Angiography;  Surgeon: Leonie Man, MD;  Location: [place] CV LAB;  Service: Cardiovascular;  Laterality: N/A;  . ESOPHAGEAL DILATION  08/2010  . ESOPHAGEAL DILATION     "once or twice" (01/22/2013)  . HAMMER TOE SURGERY Bilateral ~ 2002  . LEFT HEART CATHETERIZATION WITH CORONARY ANGIOGRAM N/A 12/11/2012   Procedure: LEFT HEART CATHETERIZATION WITH CORONARY ANGIOGRAM;  Surgeon: Leonie Man, MD;  Location: [facility] CATH LAB;  Service: Cardiovascular;  Laterality: N/A;  . LEFT HEART CATHETERIZATION WITH CORONARY ANGIOGRAM N/A 12/15/2012   Procedure: LEFT HEART CATHETERIZATION WITH CORONARY ANGIOGRAM;  Surgeon: Leonie Man, MD;  Location: [facility] CATH LAB;  Service: Cardiovascular;  Laterality: N/A;  . PERIPHERAL VENOUS STUDY  10/03/2008   No evidence of DVT, superficial thrombosis, or Baker's cyst  . TONSILLECTOMY AND ADENOIDECTOMY  1960's  . TUBAL LIGATION  1985    Current Medications: Current Meds  Medication Sig  . ALPRAZolam (XANAX) 0.5 MG tablet TAKE 1 TABLET BY MOUTH 3 TIMES DAILY AS NEEDED FOR ANXIETY  . amLODipine (NORVASC) 5 MG tablet TAKE ONE TABLET BY MOUTH ONE TIME DAILY   . aspirin EC 81 MG EC tablet Take 1 tablet (81 mg total) by mouth daily.  . clopidogrel (PLAVIX) 75 MG tablet TAKE ONE TABLET BY MOUTH ONE TIME DAILY  WITH BREAKFAST  . estradiol (ESTRACE) 1 MG tablet TAKE ONE TABLET BY MOUTH ONE TIME DAILY   . Evolocumab (REPATHA SURECLICK) 341 MG/ML SOAJ Inject 1 Dose into the skin every 14 (fourteen) days.  . isosorbide mononitrate (IMDUR) 60 MG 24 hr tablet Take 1 tablet (60 mg total) by mouth daily.  Marland Kitchen lisinopril (ZESTRIL) 5 MG tablet Take 1 tablet (5 mg total) by mouth daily.  . metoprolol tartrate (LOPRESSOR) 25 MG tablet Take 1 tablet (25 mg total) by mouth 2 (two) times daily.  .  nitroGLYCERIN (NITROSTAT) 0.4 MG SL tablet Place 1 tablet (0.4 mg total) under the tongue every 5 (five) minutes as needed for chest pain.  . pantoprazole (PROTONIX) 40 MG tablet Take 1 tablet (40 mg total) by mouth daily.  . ranolazine (RANEXA) 1000 MG SR tablet TAKE ONE TABLET BY MOUTH TWICE DAILY  (Patient taking differently: Take 1,000 mg by mouth daily. )  . [DISCONTINUED] isosorbide mononitrate (IMDUR) 60 MG 24 hr tablet TAKE ONE TABLET BY MOUTH ONE TIME DAILY *office visit needed for further refills*  . [DISCONTINUED] lisinopril (ZESTRIL) 5 MG tablet TAKE ONE TABLET BY MOUTH ONE TIME DAILY *office visit needed for further refills*  . [DISCONTINUED] metoprolol tartrate (LOPRESSOR) 25 MG tablet TAKE 1 TABLET BY MOUTH TWICE A DAY **NEEDS OFFICE VISIT**  . [DISCONTINUED] nitroGLYCERIN (NITROSTAT) 0.4 MG SL tablet Place 1 tablet (0.4 mg total) under  the tongue every 5 (five) minutes as needed for chest pain.     Allergies:   Patient has no known allergies.   Social History   Socioeconomic History  . Marital status: Single    Spouse name: Not on file  . Number of children: 2  . Years of education: Not on file  . Highest education level: Not on file  Occupational History  . Occupation: Drug enforcement    CommentPassenger transport manager  Tobacco Use  . Smoking status: Never Smoker  . Smokeless tobacco: Never Used  Vaping Use  . Vaping Use: Never used  Substance and Sexual Activity  . Alcohol use: No  . Drug use: No  . Sexual activity: Never  Other Topics Concern  . Not on file  Social History Narrative   Daily caffeine use.   Social Determinants of Health   Financial Resource Strain:   . Difficulty of Paying Living Expenses:   Food Insecurity:   . Worried About Charity fundraiser in the Last Year:   . Arboriculturist in the Last Year:   Transportation Needs:   . Film/video editor (Medical):   Marland Kitchen Lack of Transportation (Non-Medical):   Physical Activity:   . Days of  Exercise per Week:   . Minutes of Exercise per Session:   Stress:   . Feeling of Stress :   Social Connections:   . Frequency of Communication with Friends and Family:   . Frequency of Social Gatherings with Friends and Family:   . Attends Religious Services:   . Active Member of Clubs or Organizations:   . Attends Archivist Meetings:   Marland Kitchen Marital Status:      Family History: The patient's family history includes Colon cancer (age of onset: 23) in her brother; Heart attack in her brother; Kidney disease in her mother; Multiple myeloma in her sister; Other in her brother and brother; Stroke in her father.  ROS:   Please see the history of present illness.    All other systems reviewed and are negative.  EKGs/Labs/Other Studies Reviewed:    The following studies were reviewed today:  Echo 10/10/2019 1. Left ventricular ejection fraction, by estimation, is 50 to 55%. The  left ventricle has low normal function. The left ventricle demonstrates  regional wall motion abnormalities (see scoring diagram/findings for  description). There is moderate  asymmetric left ventricular hypertrophy. Left ventricular diastolic  parameters were normal. There is severe hypokinesis of the left  ventricular, mid anteroseptal wall.  2. Right ventricular systolic function is normal. The right ventricular  size is normal. There is normal pulmonary artery systolic pressure.  3. Left atrial size was mildly dilated.  4. The mitral valve is normal in structure. Mild to moderate mitral valve  regurgitation. No evidence of mitral stenosis.  5. The aortic valve is normal in structure. Aortic valve regurgitation is  not visualized. No aortic stenosis is present.    EKG:  EKG is not ordered today.   Recent Labs: 03/22/2019: BUN 18; Creatinine, Ser 1.15; Hemoglobin 12.3; Platelets 276.0; Potassium 4.1; Sodium 137 10/02/2019: ALT 10  Recent Lipid Panel    Component Value Date/Time   CHOL 236  (H) 10/02/2019 0850   TRIG 150 (H) 10/02/2019 0850   HDL 72 10/02/2019 0850   CHOLHDL 3.3 10/02/2019 0850   CHOLHDL 3 03/22/2019 0839   VLDL 35.8 03/22/2019 0839   LDLCALC 138 (H) 10/02/2019 0850   LDLDIRECT 128.0 01/12/2017 1455  Physical Exam:    VS:  BP 110/68   Pulse 68   Ht 5' 3" (1.6 m)   Wt 210 lb (95.3 kg)   SpO2 96%   BMI 37.20 kg/m     Wt Readings from Last 3 Encounters:  01/31/20 210 lb (95.3 kg)  01/30/20 209 lb (94.8 kg)  11/02/19 207 lb 3.2 oz (94 kg)     GEN:  Well nourished, well developed in no acute distress HEENT: Normal NECK: No JVD; No carotid bruits LYMPHATICS: No lymphadenopathy CARDIAC: RRR, no murmurs, rubs, gallops RESPIRATORY:  Clear to auscultation without rales, wheezing or rhonchi  ABDOMEN: Soft, non-tender, non-distended MUSCULOSKELETAL:  No edema; No deformity  SKIN: Warm and dry NEUROLOGIC:  Alert and oriented x 3 PSYCHIATRIC:  Normal affect   ASSESSMENT:    1. Coronary artery disease involving native coronary artery of native heart without angina pectoris   2. Mixed hyperlipidemia   3. Essential hypertension    PLAN:    In order of problems listed above:  1. CAD: Previous anginal symptom was combination of shoulder pain and dyspnea, although she has had dyspnea recently, however symptom is very well controlled at this time and she does not have any recent shoulder pain.  Echocardiogram obtained in March 2021 showed EF 50 to 55% moderate asymmetric LVH, mild to moderate MR, severe hypokinesis in the mid anteroseptal wall consistent with her previously known occluded LAD.  Continue aspirin, Repatha, Plavix, metoprolol, amlodipine, Imdur and Ranexa  2. Hyperlipidemia: On Repatha, she is paying about $80 out-of-pocket per month.  There was a period of time she was off the Repatha due to cost reasons, however has restarted this since early April.  She will need a repeat fasting lipid panel this month prior to follow-up with Dr.  Debara Pickett  3. Hypertension: Blood pressure stable    Medication Adjustments/Labs and Tests Ordered: Current medicines are reviewed at length with the patient today.  Concerns regarding medicines are outlined above.  No orders of the defined types were placed in this encounter.  Meds ordered this encounter  Medications  . isosorbide mononitrate (IMDUR) 60 MG 24 hr tablet    Sig: Take 1 tablet (60 mg total) by mouth daily.    Dispense:  90 tablet    Refill:  3  . lisinopril (ZESTRIL) 5 MG tablet    Sig: Take 1 tablet (5 mg total) by mouth daily.    Dispense:  90 tablet    Refill:  3  . metoprolol tartrate (LOPRESSOR) 25 MG tablet    Sig: Take 1 tablet (25 mg total) by mouth 2 (two) times daily.    Dispense:  180 tablet    Refill:  3  . nitroGLYCERIN (NITROSTAT) 0.4 MG SL tablet    Sig: Place 1 tablet (0.4 mg total) under the tongue every 5 (five) minutes as needed for chest pain.    Dispense:  25 tablet    Refill:  3    Patient Instructions  Medication Instructions:  Your physician recommends that you continue on your current medications as directed. Please refer to the Current Medication list given to you today.  *If you need a refill on your cardiac medications before your next appointment, please call your pharmacy*  Lab Work: Your physician recommends that you return for a FASTING lipid profile sometime the month of July:   FASTING LIPID PANEL-DO NOT EAT OR DRINK PAST MIDNIGHT. OKAY TO HAVE WATER.  If you have labs (blood work)  drawn today and your tests are completely normal, you will receive your results only by: Marland Kitchen MyChart Message (if you have MyChart) OR . A paper copy in the mail If you have any lab test that is abnormal or we need to change your treatment, we will call you to review the results.  Testing/Procedures: NONE ordered at this time of appointment   Follow-Up: At [facility], you and your health needs are our priority.  As part of our continuing  mission to provide you with exceptional heart care, we have created designated Provider Care Teams.  These Care Teams include your primary Cardiologist (physician) and Advanced Practice Providers (APPs -  Physician Assistants and Nurse Practitioners) who all work together to provide you with the care you need, when you need it.  We recommend signing up for the patient portal called "MyChart".  Sign up information is provided on this After Visit Summary.  MyChart is used to connect with patients for Virtual Visits (Telemedicine).  Patients are able to view lab/test results, encounter notes, upcoming appointments, etc.  Non-urgent messages can be sent to your provider as well.   To learn more about what you can do with MyChart, go to [email or web address].    Your next appointment:   1-2 month(s)  The format for your next appointment:   In Person  Provider:   Raliegh Ip Mali Hilty, MD Lipid Clinic  Other Instructions      Signed, Almyra Deforest, [place]  02/01/2020 9:17 AM    [place]

## 2020-01-31 NOTE — Patient Instructions (Signed)
Medication Instructions:  Your physician recommends that you continue on your current medications as directed. Please refer to the Current Medication list given to you today.  *If you need a refill on your cardiac medications before your next appointment, please call your pharmacy*  Lab Work: Your physician recommends that you return for a FASTING lipid profile sometime the month of July:   FASTING LIPID PANEL-DO NOT EAT OR DRINK PAST MIDNIGHT. OKAY TO HAVE WATER.  If you have labs (blood work) drawn today and your tests are completely normal, you will receive your results only by: Marland Kitchen MyChart Message (if you have MyChart) OR . A paper copy in the mail If you have any lab test that is abnormal or we need to change your treatment, we will call you to review the results.  Testing/Procedures: NONE ordered at this time of appointment   Follow-Up: At [facility], you and your health needs are our priority.  As part of our continuing mission to provide you with exceptional heart care, we have created designated Provider Care Teams.  These Care Teams include your primary Cardiologist (physician) and Advanced Practice Providers (APPs -  Physician Assistants and Nurse Practitioners) who all work together to provide you with the care you need, when you need it.  We recommend signing up for the patient portal called "MyChart".  Sign up information is provided on this After Visit Summary.  MyChart is used to connect with patients for Virtual Visits (Telemedicine).  Patients are able to view lab/test results, encounter notes, upcoming appointments, etc.  Non-urgent messages can be sent to your provider as well.   To learn more about what you can do with MyChart, go to [email or web address].    Your next appointment:   1-2 month(s)  The format for your next appointment:   In Person  Provider:   K. Mali Hilty, MD Lipid Clinic  Other Instructions

## 2020-02-01 ENCOUNTER — Encounter: Payer: Self-pay | Admitting: Physician Assistant

## 2020-02-06 DIAGNOSIS — E782 Mixed hyperlipidemia: Secondary | ICD-10-CM | POA: Diagnosis not present

## 2020-02-07 LAB — LIPID PANEL
Chol/HDL Ratio: 2.4 ratio (ref 0.0–4.4)
Cholesterol, Total: 149 mg/dL (ref 100–199)
HDL: 61 mg/dL (ref >39)
LDL Chol Calc (NIH): 60 mg/dL (ref 0–99)
Triglycerides: 170 mg/dL — ABNORMAL HIGH (ref 0–149)
VLDL Cholesterol Cal: 28 mg/dL (ref 5–40)

## 2020-02-12 ENCOUNTER — Other Ambulatory Visit: Payer: Self-pay

## 2020-02-12 ENCOUNTER — Ambulatory Visit: Payer: Federal, State, Local not specified - PPO | Admitting: Internal Medicine

## 2020-02-12 ENCOUNTER — Encounter: Payer: Self-pay | Admitting: Internal Medicine

## 2020-02-12 VITALS — BP 120/82 | HR 63 | Ht 63.0 in | Wt 210.6 lb

## 2020-02-12 DIAGNOSIS — E782 Mixed hyperlipidemia: Secondary | ICD-10-CM | POA: Diagnosis not present

## 2020-02-12 DIAGNOSIS — M791 Myalgia, unspecified site: Secondary | ICD-10-CM | POA: Diagnosis not present

## 2020-02-12 DIAGNOSIS — T466X5A Adverse effect of antihyperlipidemic and antiarteriosclerotic drugs, initial encounter: Secondary | ICD-10-CM

## 2020-02-12 DIAGNOSIS — I2510 Atherosclerotic heart disease of native coronary artery without angina pectoris: Secondary | ICD-10-CM | POA: Diagnosis not present

## 2020-02-12 NOTE — Progress Notes (Signed)
LIPID CLINIC CONSULT NOTE  Chief Complaint:  Manage dyslipidemia  Primary Care Physician: Darreld Mclean, MD  Primary Cardiologist:  Quay Burow, MD  HPI:  [name] is a 64 y.o. female who is being seen today for the evaluation of dyslipidemia at the request of Copland, Gay Filler, MD.  This is a pleasant 64 year old female patient of Dr. Alvester Chou with a history of coronary artery disease and occluded LAD as well as mitral valvular disease.  She has had persistent dyslipidemia and unfortunately statin intolerance.  Most recently her lipids show total cholesterol 236, triglycerides 150, HDL 72 and LDL 138.  Her target LDL is less than 70.  She was approved for Repatha and took it for a brief period of time however was getting it via mail order which was cumbersome and the cost was about $70 a month which was not affordable for her.  Ultimately she discontinued it.  She is here today to discuss additional options.  I do think that a PCSK9 inhibitor is the best option for her.  Other options could be ezetimibe, Nexletol or other therapies however cardiovascular risk reduction is not a significant or unknown.  02/12/2020  [name] returns today for follow-up.  She seems to be tolerating Repatha with a nice improvement in her lipids.  Her total cholesterol is decreased from 236-149, triglycerides have gone up slightly to 170 with a decrease in LDL cholesterol from 138-60.  PMHx:  Past Medical History:  Diagnosis Date  . Angina effort 05/22/2013  . Anxiety   . CAD (coronary artery disease), possible SCAD 12/14/2012  . Colon polyps   . Duodenitis   . Dyspnea on exertion    LEXISCAN, 09/30/2008 - normal, EKG negative for ischemia, no ECG changes  . Esophageal stricture   . Gastritis   . GERD (gastroesophageal reflux disease)   . Gout    "only from RX given after MI" (01/22/2013)  . H/O hiatal hernia   . History of esophageal stricture   . Hypertension    2D ECHO,  09/30/2008 - EF >55%, normal: RENAL DOPPLER, 03/21/2007 - normal duplex study  . Hypothyroidism   . IBS (irritable bowel syndrome)   . Ischemic colitis ([place])   . Lower GI bleeding    10/07/11 "I've had 3 episodes in the past year"  . Myocardial infarction [facility]) 12/10/2012   "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)  . Tubular adenoma polyp of rectum 2008    Past Surgical History:  Procedure Laterality Date  . ABDOMINAL HYSTERECTOMY  1999  . ANTERIOR LUMBAR FUSION  2010   "L4-5" (01/22/2013)  . BACK SURGERY  09/2008   FUSION/l4-5  . CARDIAC CATHETERIZATION N/A 02/06/2015   Procedure: Left Heart Cath and Coronary Angiography;  Surgeon: Leonie Man, MD;  Location: [place] CV LAB;  Service: Cardiovascular;  Laterality: N/A;  . ESOPHAGEAL DILATION  08/2010  . ESOPHAGEAL DILATION     "once or twice" (01/22/2013)  . HAMMER TOE SURGERY Bilateral ~ 2002  . LEFT HEART CATHETERIZATION WITH CORONARY ANGIOGRAM N/A 12/11/2012   Procedure: LEFT HEART CATHETERIZATION WITH CORONARY ANGIOGRAM;  Surgeon: Leonie Man, MD;  Location: [facility] CATH LAB;  Service: Cardiovascular;  Laterality: N/A;  . LEFT HEART CATHETERIZATION WITH CORONARY ANGIOGRAM N/A 12/15/2012   Procedure: LEFT HEART CATHETERIZATION WITH CORONARY ANGIOGRAM;  Surgeon: Leonie Man, MD;  Location: [facility] CATH LAB;  Service: Cardiovascular;  Laterality: N/A;  . PERIPHERAL VENOUS STUDY  10/03/2008   No  evidence of DVT, superficial thrombosis, or Baker's cyst  . TONSILLECTOMY AND ADENOIDECTOMY  1960's  . TUBAL LIGATION  1985    FAMHx:  Family History  Problem Relation Age of Onset  . Colon cancer Brother 30  . Kidney disease Mother   . Stroke Father   . Multiple myeloma Sister   . Heart attack Brother   . Other Brother   . Other Brother     SOCHx:   reports that she has never smoked. She has never used smokeless tobacco. She reports that she does not drink alcohol and does not use drugs.  ALLERGIES:  No Known  Allergies  ROS: Pertinent items noted in HPI and remainder of comprehensive ROS otherwise negative.  HOME MEDS: Current Outpatient Medications on File Prior to Visit  Medication Sig Dispense Refill  . ALPRAZolam (XANAX) 0.5 MG tablet TAKE 1 TABLET BY MOUTH 3 TIMES DAILY AS NEEDED FOR ANXIETY 90 tablet 2  . amLODipine (NORVASC) 5 MG tablet TAKE ONE TABLET BY MOUTH ONE TIME DAILY  90 tablet 1  . aspirin EC 81 MG EC tablet Take 1 tablet (81 mg total) by mouth daily.    . clopidogrel (PLAVIX) 75 MG tablet TAKE ONE TABLET BY MOUTH ONE TIME DAILY  WITH BREAKFAST 90 tablet 1  . estradiol (ESTRACE) 1 MG tablet TAKE ONE TABLET BY MOUTH ONE TIME DAILY  30 tablet 5  . Evolocumab (REPATHA SURECLICK) 086 MG/ML SOAJ Inject 1 Dose into the skin every 14 (fourteen) days. 6 pen 3  . isosorbide mononitrate (IMDUR) 60 MG 24 hr tablet Take 1 tablet (60 mg total) by mouth daily. 90 tablet 3  . lisinopril (ZESTRIL) 5 MG tablet Take 1 tablet (5 mg total) by mouth daily. 90 tablet 3  . metoprolol tartrate (LOPRESSOR) 25 MG tablet Take 1 tablet (25 mg total) by mouth 2 (two) times daily. 180 tablet 3  . nitroGLYCERIN (NITROSTAT) 0.4 MG SL tablet Place 1 tablet (0.4 mg total) under the tongue every 5 (five) minutes as needed for chest pain. 25 tablet 3  . pantoprazole (PROTONIX) 40 MG tablet Take 1 tablet (40 mg total) by mouth daily. 90 tablet 3  . ranolazine (RANEXA) 1000 MG SR tablet TAKE ONE TABLET BY MOUTH TWICE DAILY  (Patient taking differently: Take 1,000 mg by mouth daily. ) 180 tablet 0   No current facility-administered medications on file prior to visit.    LABS/IMAGING: No results found for this or any previous visit (from the past 48 hour(s)). No results found.  LIPID PANEL:    Component Value Date/Time   CHOL 149 02/06/2020 0906   TRIG 170 (H) 02/06/2020 0906   HDL 61 02/06/2020 0906   CHOLHDL 2.4 02/06/2020 0906   CHOLHDL 3 03/22/2019 0839   VLDL 35.8 03/22/2019 0839   LDLCALC 60  02/06/2020 0906   LDLDIRECT 128.0 01/12/2017 1455    WEIGHTS: Wt Readings from Last 3 Encounters:  02/12/20 210 lb 9.6 oz (95.5 kg)  01/31/20 210 lb (95.3 kg)  01/30/20 209 lb (94.8 kg)    VITALS: BP 120/82   Pulse 63   Ht 5' 3"  (1.6 m)   Wt 210 lb 9.6 oz (95.5 kg)   SpO2 96%   BMI 37.31 kg/m   EXAM: Deferred  EKG: Deferred  ASSESSMENT: 1. Mixed dyslipidemia, goal LDL less than 70 2. Statin intolerance 3. Coronary artery disease with occluded LAD 4. Mitral valve disease  PLAN: 1.   [name] has achieved a  target LDL less than 70 on Repatha.  She seems to be tolerating this well.  There is been a slight uptake in triglycerides which are of little consequence.  I will continue current therapies and would recommend repeating labs in about 6 months.  Pixie Casino, MD, [facility], [facility] Director of the Advanced Lipid Disorders &  Cardiovascular Risk Reduction Clinic Diplomate of the American Board of Clinical Lipidology Attending Cardiologist  Direct Dial: [phone]  Fax: [phone]  Website:  [email or web address] 02/12/2020, 11:23 AM

## 2020-02-12 NOTE — Patient Instructions (Signed)
Medication Instructions:  Your physician recommends that you continue on your current medications as directed. Please refer to the Current Medication list given to you today.  *If you need a refill on your cardiac medications before your next appointment, please call your pharmacy*   Lab Work: FASTING lipid panel in 6 months to check cholesterol   If you have labs (blood work) drawn today and your tests are completely normal, you will receive your results only by: Marland Kitchen MyChart Message (if you have MyChart) OR . A paper copy in the mail If you have any lab test that is abnormal or we need to change your treatment, we will call you to review the results.   Testing/Procedures: NONE   Follow-Up: At [facility], you and your health needs are our priority.  As part of our continuing mission to provide you with exceptional heart care, we have created designated Provider Care Teams.  These Care Teams include your primary Cardiologist (physician) and Advanced Practice Providers (APPs -  Physician Assistants and Nurse Practitioners) who all work together to provide you with the care you need, when you need it.  We recommend signing up for the patient portal called "MyChart".  Sign up information is provided on this After Visit Summary.  MyChart is used to connect with patients for Virtual Visits (Telemedicine).  Patients are able to view lab/test results, encounter notes, upcoming appointments, etc.  Non-urgent messages can be sent to your provider as well.   To learn more about what you can do with MyChart, go to [email or web address].    Your next appointment:   6 month(s) - lipid clinic  The format for your next appointment:   Either In Person or Virtual  Provider:   K. Mali Hilty, MD   Other Instructions

## 2020-02-13 NOTE — Patient Instructions (Addendum)
It was great to see you again today, I will be in touch with your results as possible! We will check to see if you are susceptible to shingles or chicken pox today  For headaches- first try treatment for allergies Take a claritin or zyrtec and use a nasal steroid spray such as flonase daily for 2 weeks.  If NOT helpful try holding one dose of Repatha If still not helpful please contact me for a referral to neurology

## 2020-02-13 NOTE — Progress Notes (Addendum)
[facility] [address], [place], [place] [address] [phone]  Date:  02/14/2020   Name:  [name]   DOB:  July 22, 1956   MRN:  [id number]  PCP:  Darreld Mclean, MD    Chief Complaint: Headache (month or two, took benadryl to help and tylenol)   History of Present Illness:  [name] is a 64 y.o. very pleasant female patient who presents with the following:  Patient here today for follow-up visit.  She was recently seen by cardiology, is now also taking Repatha per lipid clinic 1. CAD: Previous anginal symptom was combination of shoulder pain and dyspnea, although she has had dyspnea recently, however symptom is very well controlled at this time and she does not have any recent shoulder pain.  Echocardiogram obtained in March 2021 showed EF 50 to 55% moderate asymmetric LVH, mild to moderate MR, severe hypokinesis in the mid anteroseptal wall consistent with her previously known occluded LAD.  Continue aspirin, Repatha, Plavix, metoprolol, amlodipine, Imdur and Ranexa 2. Hyperlipidemia: On Repatha, she is paying about $80 out-of-pocket per month.  There was a period of time she was off the Repatha due to cost reasons, however has restarted this since early April.  She will need a repeat fasting lipid panel this month prior to follow-up with Dr. Debara Pickett  Last seen by myself in April 2020 for virtual visit when she was sick with diarrhea  Covid series- done Shingrix-not done yet, she did not have chicken pox per her knowledge.  We will check a varicella-zoster titer today Mammogram- scheduled for next week  Pap smear-she is status post hysterectomy Colon cancer screening up-to-date Recent lipid panel and liver panel on chart, otherwise we have not updated her labs in about 1 year  She has been using Repatha again for a few months; this works great for her lipids but can be difficult financially She  notes "naggy headaches" generally in the right side of her head but also could be on the left.  She tends to rub her forehead when these occur "I am afraid I will get a rash from rubbing my head so much"-no rash or skin lesions present however This feels like when she had uncontrolled HTN in the past, but her BP is now under good control The HA have been present for 3 months, "all day every day."  She feels that increased headache frequency does coincide with going back on Repatha She does also have some possible allergy symptoms No vomiting No vision or hearing change No nausea, light or sound sensitivity  She is not taking any allergy meds right now   Alprazolam Amlodipine 5 Aspirin 81 Plavix Repatha Imdur Lisinopril 5 Lopressor 25 twice daily Protonix Ranexa Estrace 1 mg tablet daily Patient Active Problem List   Diagnosis Date Noted  . Dyspnea on exertion 10/24/2019  . Pre-diabetes 01/14/2017  . 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 Tachycardia, somewhat irregular, episodic 10/08/2013  . Dizziness 10/08/2013  . Angina effort 05/22/2013  . Unstable angina ([place]) 01/22/2013  . Hyperlipidemia 01/01/2013  . CAD (coronary artery disease), possible SCAD 12/14/2012  . NSTEMI (non-ST elevated myocardial infarction) ([place]) 12/10/2012  . Essential hypertension 12/10/2012  . Hypothyroidism 12/10/2012  . Obesity (BMI 35.0-39.9 without comorbidity) 12/10/2012  . Gastroenteritis 10/08/2011  . Acute ischemic colitis ([place]) 08/09/2011  . DYSPHAGIA UNSPECIFIED 07/01/2010  . RECTAL BLEEDING Jan 2013 11/04/2009  . GERD  02/11/2009  . Irritable bowel syndrome 02/11/2009  . PERSONAL HX COLONIC POLYPS 02/11/2009    Past Medical History:  Diagnosis Date  . Angina effort 05/22/2013  . Anxiety   . CAD (coronary artery disease), possible SCAD 12/14/2012  . Colon polyps   . Duodenitis   . Dyspnea on exertion    LEXISCAN, 09/30/2008 - normal, EKG negative for ischemia, no ECG changes  .  Esophageal stricture   . Gastritis   . GERD (gastroesophageal reflux disease)   . Gout    "only from RX given after MI" (01/22/2013)  . H/O hiatal hernia   . History of esophageal stricture   . Hypertension    2D ECHO, 09/30/2008 - EF >55%, normal: RENAL DOPPLER, 03/21/2007 - normal duplex study  . Hypothyroidism   . IBS (irritable bowel syndrome)   . Ischemic colitis ([place])   . Lower GI bleeding    10/07/11 "I've had 3 episodes in the past year"  . Myocardial infarction [facility]) 12/10/2012   "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)  . Tubular adenoma polyp of rectum 2008    Past Surgical History:  Procedure Laterality Date  . ABDOMINAL HYSTERECTOMY  1999  . ANTERIOR LUMBAR FUSION  2010   "L4-5" (01/22/2013)  . BACK SURGERY  09/2008   FUSION/l4-5  . CARDIAC CATHETERIZATION N/A 02/06/2015   Procedure: Left Heart Cath and Coronary Angiography;  Surgeon: Leonie Man, MD;  Location: [facility];  Service: Cardiovascular;  Laterality: N/A;  . ESOPHAGEAL DILATION  08/2010  . ESOPHAGEAL DILATION     "once or twice" (01/22/2013)  . HAMMER TOE SURGERY Bilateral ~ 2002  . LEFT HEART CATHETERIZATION WITH CORONARY ANGIOGRAM N/A 12/11/2012   Procedure: LEFT HEART CATHETERIZATION WITH CORONARY ANGIOGRAM;  Surgeon: Leonie Man, MD;  Location: [facility] CATH LAB;  Service: Cardiovascular;  Laterality: N/A;  . LEFT HEART CATHETERIZATION WITH CORONARY ANGIOGRAM N/A 12/15/2012   Procedure: LEFT HEART CATHETERIZATION WITH CORONARY ANGIOGRAM;  Surgeon: Leonie Man, MD;  Location: [facility] CATH LAB;  Service: Cardiovascular;  Laterality: N/A;  . PERIPHERAL VENOUS STUDY  10/03/2008   No evidence of DVT, superficial thrombosis, or Baker's cyst  . TONSILLECTOMY AND ADENOIDECTOMY  1960's  . TUBAL LIGATION  1985    Social History   Tobacco Use  . Smoking status: Never Smoker  . Smokeless tobacco: Never Used  Vaping Use  . Vaping Use: Never used  Substance Use Topics  . Alcohol use: No  . Drug  use: No    Family History  Problem Relation Age of Onset  . Colon cancer Brother 65  . Kidney disease Mother   . Stroke Father   . Multiple myeloma Sister   . Heart attack Brother   . Other Brother   . Other Brother     No Known Allergies  Medication list has been reviewed and updated.  Current Outpatient Medications on File Prior to Visit  Medication Sig Dispense Refill  . ALPRAZolam (XANAX) 0.5 MG tablet TAKE 1 TABLET BY MOUTH 3 TIMES DAILY AS NEEDED FOR ANXIETY 90 tablet 2  . amLODipine (NORVASC) 5 MG tablet TAKE ONE TABLET BY MOUTH ONE TIME DAILY  90 tablet 1  . aspirin EC 81 MG EC tablet Take 1 tablet (81 mg total) by mouth daily.    . clopidogrel (PLAVIX) 75 MG tablet TAKE ONE TABLET BY MOUTH ONE TIME DAILY  WITH BREAKFAST 90 tablet 1  . estradiol (ESTRACE) 1 MG tablet TAKE ONE TABLET BY  MOUTH ONE TIME DAILY  30 tablet 5  . Evolocumab (REPATHA SURECLICK) 350 MG/ML SOAJ Inject 1 Dose into the skin every 14 (fourteen) days. 6 pen 3  . isosorbide mononitrate (IMDUR) 60 MG 24 hr tablet Take 1 tablet (60 mg total) by mouth daily. 90 tablet 3  . lisinopril (ZESTRIL) 5 MG tablet Take 1 tablet (5 mg total) by mouth daily. 90 tablet 3  . metoprolol tartrate (LOPRESSOR) 25 MG tablet Take 1 tablet (25 mg total) by mouth 2 (two) times daily. 180 tablet 3  . nitroGLYCERIN (NITROSTAT) 0.4 MG SL tablet Place 1 tablet (0.4 mg total) under the tongue every 5 (five) minutes as needed for chest pain. 25 tablet 3  . pantoprazole (PROTONIX) 40 MG tablet Take 1 tablet (40 mg total) by mouth daily. 90 tablet 3  . ranolazine (RANEXA) 1000 MG SR tablet TAKE ONE TABLET BY MOUTH TWICE DAILY  (Patient taking differently: Take 1,000 mg by mouth daily. ) 180 tablet 0   No current facility-administered medications on file prior to visit.    Review of Systems:  As per HPI- otherwise negative.   Physical Examination: Vitals:   02/14/20 0822  BP: 126/70  Pulse: 68  Resp: 17  SpO2: 96%   Vitals:    02/14/20 0822  Weight: 210 lb (95.3 kg)  Height: 5' 3"  (1.6 m)   Body mass index is 37.2 kg/m. Ideal Body Weight: Weight in (lb) to have BMI = 25: 140.8  GEN: no acute distress.  Obese, looks well HEENT: Atraumatic, Normocephalic.   Bilateral TM wnl, oropharynx normal.  PEERL,EOMI.   Ears and Nose: No external deformity. CV: RRR, No M/G/R. No JVD. No thrill. No extra heart sounds. PULM: CTA B, no wheezes, crackles, rhonchi. No retractions. No resp. distress. No accessory muscle use. ABD: S, NT, ND, +BS. No rebound. No HSM. EXTR: No c/c/e PSYCH: Normally interactive. Conversant.  Normal strength, sensation, DTR of all extremities.  Normal facial range of motion and strength.  No tenderness over the temporal arteries   Assessment and Plan: Essential hypertension - Plan: CBC, Basic metabolic panel  Screening for deficiency anemia - Plan: CBC  Pre-diabetes - Plan: Hemoglobin A1c  Screening for HIV (human immunodeficiency virus) - Plan: HIV Antibody (routine testing w rflx)  Screening for thyroid disorder - Plan: TSH  Exposure to varicella - Plan: Varicella zoster antibody, IgG  Chronic nonintractable headache, unspecified headache type - Plan: Sedimentation rate, C-reactive protein  Patient here today for a follow-up visit and with concern of headache.  This headache may be due to allergies, could also be related to Repatha use.  Will obtain blood work as above to help rule out temporal arteritis.  I suggested that she try treating for allergies for about 2 weeks and see if this is helpful.  If no difference, she can try holding 1 dose of Repatha to see if that makes a difference.  She will keep you posted as to how she responds  Varicella-zoster antibody to determine if she needs varicella or shingles vaccines Blood pressure is controlled, labs pending as above Other routine lab work also pending- Will plan further follow- up pending labs.  This visit occurred during the  SARS-CoV-2 public health emergency.  Safety protocols were in place, including screening questions prior to the visit, additional usage of staff PPE, and extensive cleaning of exam room while observing appropriate contact time as indicated for disinfecting solutions.    Signed Lamar Blinks, MD  Received  her labs as below, message to patient  Results for orders placed or performed in visit on 02/14/20  CBC  Result Value Ref Range   WBC 6.5 4.0 - 10.5 K/uL   RBC 3.83 (L) 3.87 - 5.11 Mil/uL   Platelets 276.0 150 - 400 K/uL   Hemoglobin 12.0 12.0 - 15.0 g/dL   HCT 35.7 (L) 36 - 46 %   MCV 93.0 78.0 - 100.0 fl   MCHC 33.7 30.0 - 36.0 g/dL   RDW 13.3 11.5 - 00.9 %  Basic metabolic panel  Result Value Ref Range   Sodium 134 (L) 135 - 145 mEq/L   Potassium 4.3 3.5 - 5.1 mEq/L   Chloride 101 96 - 112 mEq/L   CO2 24 19 - 32 mEq/L   Glucose, Bld 90 70 - 99 mg/dL   BUN 19 6 - 23 mg/dL   Creatinine, Ser 1.09 0.40 - 1.20 mg/dL   GFR 50.55 (L) >60.00 mL/min   Calcium 9.4 8.4 - 10.5 mg/dL  Hemoglobin A1c  Result Value Ref Range   Hgb A1c MFr Bld 5.5 4.6 - 6.5 %  TSH  Result Value Ref Range   TSH 2.82 0.35 - 4.50 uIU/mL  Sedimentation rate  Result Value Ref Range   Sed Rate 37 (H) 0 - 30 mm/hr  C-reactive protein  Result Value Ref Range   CRP 2.4 0.5 - 20.0 mg/dL   Her age-adjusted sed rate is basically normal

## 2020-02-14 ENCOUNTER — Other Ambulatory Visit: Payer: Self-pay

## 2020-02-14 ENCOUNTER — Encounter: Payer: Self-pay | Admitting: Family Medicine

## 2020-02-14 ENCOUNTER — Ambulatory Visit: Payer: Federal, State, Local not specified - PPO | Admitting: Family Medicine

## 2020-02-14 VITALS — BP 126/70 | HR 68 | Resp 17 | Ht 63.0 in | Wt 210.0 lb

## 2020-02-14 DIAGNOSIS — R7303 Prediabetes: Secondary | ICD-10-CM | POA: Diagnosis not present

## 2020-02-14 DIAGNOSIS — Z130 Encounter for screening for diseases of the blood and blood-forming organs and certain disorders involving the immune mechanism: Secondary | ICD-10-CM

## 2020-02-14 DIAGNOSIS — R519 Headache, unspecified: Secondary | ICD-10-CM

## 2020-02-14 DIAGNOSIS — I10 Essential (primary) hypertension: Secondary | ICD-10-CM | POA: Diagnosis not present

## 2020-02-14 DIAGNOSIS — Z114 Encounter for screening for human immunodeficiency virus [HIV]: Secondary | ICD-10-CM | POA: Diagnosis not present

## 2020-02-14 DIAGNOSIS — Z1329 Encounter for screening for other suspected endocrine disorder: Secondary | ICD-10-CM

## 2020-02-14 DIAGNOSIS — G8929 Other chronic pain: Secondary | ICD-10-CM

## 2020-02-14 DIAGNOSIS — Z20820 Contact with and (suspected) exposure to varicella: Secondary | ICD-10-CM | POA: Diagnosis not present

## 2020-02-14 LAB — BASIC METABOLIC PANEL
BUN: 19 mg/dL (ref 6–23)
CO2: 24 mEq/L (ref 19–32)
Calcium: 9.4 mg/dL (ref 8.4–10.5)
Chloride: 101 mEq/L (ref 96–112)
Creatinine, Ser: 1.09 mg/dL (ref 0.40–1.20)
GFR: 50.55 mL/min — ABNORMAL LOW (ref >60.00)
Glucose, Bld: 90 mg/dL (ref 70–99)
Potassium: 4.3 mEq/L (ref 3.5–5.1)
Sodium: 134 mEq/L — ABNORMAL LOW (ref 135–145)

## 2020-02-14 LAB — SEDIMENTATION RATE: Sed Rate: 37 mm/hr — ABNORMAL HIGH (ref 0–30)

## 2020-02-14 LAB — CBC
HCT: 35.7 % — ABNORMAL LOW (ref 36.0–46.0)
Hemoglobin: 12 g/dL (ref 12.0–15.0)
MCHC: 33.7 g/dL (ref 30.0–36.0)
MCV: 93 fl (ref 78.0–100.0)
Platelets: 276 10*3/uL (ref 150.0–400.0)
RBC: 3.83 Mil/uL — ABNORMAL LOW (ref 3.87–5.11)
RDW: 13.3 % (ref 11.5–15.5)
WBC: 6.5 10*3/uL (ref 4.0–10.5)

## 2020-02-14 LAB — TSH: TSH: 2.82 u[IU]/mL (ref 0.35–4.50)

## 2020-02-14 LAB — C-REACTIVE PROTEIN: CRP: 2.4 mg/dL (ref 0.5–20.0)

## 2020-02-14 LAB — HEMOGLOBIN A1C: Hgb A1c MFr Bld: 5.5 % (ref 4.6–6.5)

## 2020-02-15 ENCOUNTER — Encounter: Payer: Self-pay | Admitting: Family Medicine

## 2020-02-15 LAB — HIV ANTIBODY (ROUTINE TESTING W REFLEX): HIV 1&2 Ab, 4th Generation: NONREACTIVE

## 2020-02-15 LAB — VARICELLA ZOSTER ANTIBODY, IGG: Varicella IgG: <135 index — ABNORMAL LOW

## 2020-02-18 DIAGNOSIS — Z01419 Encounter for gynecological examination (general) (routine) without abnormal findings: Secondary | ICD-10-CM | POA: Diagnosis not present

## 2020-02-18 DIAGNOSIS — Z1231 Encounter for screening mammogram for malignant neoplasm of breast: Secondary | ICD-10-CM | POA: Diagnosis not present

## 2020-02-19 LAB — HM MAMMOGRAPHY

## 2020-02-21 ENCOUNTER — Telehealth: Payer: Self-pay | Admitting: Internal Medicine

## 2020-02-21 DIAGNOSIS — K080 Exfoliation of teeth due to systemic causes: Secondary | ICD-10-CM | POA: Diagnosis not present

## 2020-02-21 NOTE — Telephone Encounter (Signed)
The pt is calling with an update on citrucel. She says that it is " a miracle drug"  She says it is working great and will call back if she has any further concerns or problems

## 2020-03-05 ENCOUNTER — Telehealth: Payer: Self-pay | Admitting: Internal Medicine

## 2020-03-05 ENCOUNTER — Telehealth: Payer: Self-pay | Admitting: Student

## 2020-03-05 NOTE — Telephone Encounter (Signed)
   Patient called after hours with concerns about which medications to hold for dental procedure. Called and spoke with patient. She is scheduled for a root canal tomorrow and is on DAPT with Aspirin and Plavix. She took both medications this morning. Does not look like we received pre-op request with recommendations for holding DAPT. Patient has history of SCAD in 2014 but no prior stenting. Did discuss with Dr. Claiborne Billings who stated he typically would recommend holding Plavix for 5 days before a root canal. I advised patient to hold Plavix tomorrow morning and call surgeon's office to see if they are still OK with proceeding with procedure. Patient voiced understanding and thanked me for calling back.  Darreld Mclean, [name] 03/05/2020 6:04 PM

## 2020-03-05 NOTE — Telephone Encounter (Signed)
Patient states she is scheduled to have a root canal tomorrow, 03/06/20. She states she does not know if she needs to take an antibiotic and she refuses to contact the dental office for additional information. She states she would like a call back for clearance.

## 2020-03-05 NOTE — Telephone Encounter (Signed)
Please tell pt she does not need antibiotics for dental procedure

## 2020-03-30 ENCOUNTER — Other Ambulatory Visit: Payer: Self-pay | Admitting: Cardiovascular Disease

## 2020-04-02 ENCOUNTER — Telehealth: Payer: Self-pay | Admitting: Family Medicine

## 2020-04-10 ENCOUNTER — Other Ambulatory Visit: Payer: Self-pay | Admitting: Cardiovascular Disease

## 2020-04-23 DIAGNOSIS — Z20822 Contact with and (suspected) exposure to covid-19: Secondary | ICD-10-CM | POA: Diagnosis not present

## 2020-06-17 ENCOUNTER — Other Ambulatory Visit: Payer: Self-pay | Admitting: *Deleted

## 2020-06-17 DIAGNOSIS — M791 Myalgia, unspecified site: Secondary | ICD-10-CM

## 2020-06-17 DIAGNOSIS — E782 Mixed hyperlipidemia: Secondary | ICD-10-CM

## 2020-06-17 DIAGNOSIS — T466X5A Adverse effect of antihyperlipidemic and antiarteriosclerotic drugs, initial encounter: Secondary | ICD-10-CM

## 2020-06-27 ENCOUNTER — Other Ambulatory Visit: Payer: Self-pay | Admitting: Cardiovascular Disease

## 2020-08-02 ENCOUNTER — Other Ambulatory Visit: Payer: Self-pay | Admitting: Cardiovascular Disease

## 2020-08-11 ENCOUNTER — Other Ambulatory Visit: Payer: Federal, State, Local not specified - PPO

## 2020-09-10 DIAGNOSIS — T466X5A Adverse effect of antihyperlipidemic and antiarteriosclerotic drugs, initial encounter: Secondary | ICD-10-CM | POA: Diagnosis not present

## 2020-09-10 DIAGNOSIS — E782 Mixed hyperlipidemia: Secondary | ICD-10-CM | POA: Diagnosis not present

## 2020-09-10 DIAGNOSIS — M791 Myalgia, unspecified site: Secondary | ICD-10-CM | POA: Diagnosis not present

## 2020-09-11 LAB — LIPID PANEL
Chol/HDL Ratio: 2.7 ratio (ref 0.0–4.4)
Cholesterol, Total: 175 mg/dL (ref 100–199)
HDL: 64 mg/dL (ref >39)
LDL Chol Calc (NIH): 80 mg/dL (ref 0–99)
Triglycerides: 187 mg/dL — ABNORMAL HIGH (ref 0–149)
VLDL Cholesterol Cal: 31 mg/dL (ref 5–40)

## 2020-09-16 ENCOUNTER — Telehealth: Payer: Self-pay

## 2020-09-16 NOTE — Telephone Encounter (Signed)
Patient returning call.

## 2020-09-16 NOTE — Telephone Encounter (Addendum)
Left a voice message asking patient to give office a call back for her results.  ----- Message from Pixie Casino, MD sent at 09/12/2020  6:45 PM EST ----- LDL higher at 80 from 60, but still lower than 138 approximately 1 year ago  Dr. Lemmie Evens

## 2020-09-17 NOTE — Telephone Encounter (Signed)
[name] is calling back due to not hearing back in regards to this. Please advise.

## 2020-09-17 NOTE — Telephone Encounter (Signed)
Called pt concerning recent lab values for lipid panel. Told pt lab values and what Dr. Debara Pickett said in his result note. All questions answered for pt. Pt verbalizes understanding.  Pt states she has an upcoming appointment in March with Dr. Debara Pickett, advised pt to keep this appointment.

## 2020-09-25 ENCOUNTER — Encounter: Payer: Self-pay | Admitting: Internal Medicine

## 2020-10-02 ENCOUNTER — Telehealth: Payer: Self-pay | Admitting: Internal Medicine

## 2020-10-02 NOTE — Telephone Encounter (Signed)
PA for repatha sureclick submitted via CMM (Key: [id number])

## 2020-10-07 NOTE — Telephone Encounter (Signed)
PA approved 09/03/2020- 10/03/2021

## 2020-10-10 ENCOUNTER — Encounter: Payer: Self-pay | Admitting: Internal Medicine

## 2020-10-10 ENCOUNTER — Ambulatory Visit: Payer: Federal, State, Local not specified - PPO | Admitting: Internal Medicine

## 2020-10-10 ENCOUNTER — Other Ambulatory Visit: Payer: Self-pay

## 2020-10-10 VITALS — BP 118/71 | HR 70 | Ht 63.0 in | Wt 220.4 lb

## 2020-10-10 DIAGNOSIS — E782 Mixed hyperlipidemia: Secondary | ICD-10-CM

## 2020-10-10 DIAGNOSIS — M791 Myalgia, unspecified site: Secondary | ICD-10-CM

## 2020-10-10 DIAGNOSIS — T466X5D Adverse effect of antihyperlipidemic and antiarteriosclerotic drugs, subsequent encounter: Secondary | ICD-10-CM

## 2020-10-10 DIAGNOSIS — T466X5A Adverse effect of antihyperlipidemic and antiarteriosclerotic drugs, initial encounter: Secondary | ICD-10-CM

## 2020-10-10 DIAGNOSIS — I2510 Atherosclerotic heart disease of native coronary artery without angina pectoris: Secondary | ICD-10-CM | POA: Diagnosis not present

## 2020-10-10 NOTE — Progress Notes (Signed)
LIPID CLINIC CONSULT NOTE  Chief Complaint:  Follow-up dyslipidemia  Primary Care Physician: Darreld Mclean, MD  Primary Cardiologist:  Quay Burow, MD  HPI:  [name] is a 65 y.o. female who is being seen today for the evaluation of dyslipidemia at the request of Copland, Gay Filler, MD.  This is a pleasant 65 year old female patient of Dr. Alvester Chou with a history of coronary artery disease and occluded LAD as well as mitral valvular disease.  She has had persistent dyslipidemia and unfortunately statin intolerance.  Most recently her lipids show total cholesterol 236, triglycerides 150, HDL 72 and LDL 138.  Her target LDL is less than 70.  She was approved for Repatha and took it for a brief period of time however was getting it via mail order which was cumbersome and the cost was about $70 a month which was not affordable for her.  Ultimately she discontinued it.  She is here today to discuss additional options.  I do think that a PCSK9 inhibitor is the best option for her.  Other options could be ezetimibe, Nexletol or other therapies however cardiovascular risk reduction is not a significant or unknown.  02/12/2020  [name] returns today for follow-up.  She seems to be tolerating Repatha with a nice improvement in her lipids.  Her total cholesterol is decreased from 236-149, triglycerides have gone up slightly to 170 with a decrease in LDL cholesterol from 138-60.  10/10/2020  [name] returns today for follow-up of dyslipidemia.  She had labs drawn last month and has much improved cholesterol although not quite to goal.  Her total cholesterol is 175, HDL 64 LDL 80 and triglycerides 187.  Her target LDL is less than 70.  PMHx:  Past Medical History:  Diagnosis Date  . Angina effort 05/22/2013  . Anxiety   . CAD (coronary artery disease), possible SCAD 12/14/2012  . Colon polyps   . Duodenitis   . Dyspnea on exertion    LEXISCAN, 09/30/2008 - normal, EKG  negative for ischemia, no ECG changes  . Esophageal stricture   . Gastritis   . GERD (gastroesophageal reflux disease)   . Gout    "only from RX given after MI" (01/22/2013)  . H/O hiatal hernia   . History of esophageal stricture   . Hypertension    2D ECHO, 09/30/2008 - EF >55%, normal: RENAL DOPPLER, 03/21/2007 - normal duplex study  . Hypothyroidism   . IBS (irritable bowel syndrome)   . Ischemic colitis ([place])   . Lower GI bleeding    10/07/11 "I've had 3 episodes in the past year"  . Myocardial infarction [facility]) 12/10/2012   "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)  . Tubular adenoma polyp of rectum 2008    Past Surgical History:  Procedure Laterality Date  . ABDOMINAL HYSTERECTOMY  1999  . ANTERIOR LUMBAR FUSION  2010   "L4-5" (01/22/2013)  . BACK SURGERY  09/2008   FUSION/l4-5  . CARDIAC CATHETERIZATION N/A 02/06/2015   Procedure: Left Heart Cath and Coronary Angiography;  Surgeon: Leonie Man, MD;  Location: [facility];  Service: Cardiovascular;  Laterality: N/A;  . ESOPHAGEAL DILATION  08/2010  . ESOPHAGEAL DILATION     "once or twice" (01/22/2013)  . HAMMER TOE SURGERY Bilateral ~ 2002  . LEFT HEART CATHETERIZATION WITH CORONARY ANGIOGRAM N/A 12/11/2012   Procedure: LEFT HEART CATHETERIZATION WITH CORONARY ANGIOGRAM;  Surgeon: Leonie Man, MD;  Location: [facility] CATH LAB;  Service: Cardiovascular;  Laterality: N/A;  . LEFT HEART CATHETERIZATION WITH CORONARY ANGIOGRAM N/A 12/15/2012   Procedure: LEFT HEART CATHETERIZATION WITH CORONARY ANGIOGRAM;  Surgeon: Leonie Man, MD;  Location: [facility] CATH LAB;  Service: Cardiovascular;  Laterality: N/A;  . PERIPHERAL VENOUS STUDY  10/03/2008   No evidence of DVT, superficial thrombosis, or Baker's cyst  . TONSILLECTOMY AND ADENOIDECTOMY  1960's  . TUBAL LIGATION  1985    FAMHx:  Family History  Problem Relation Age of Onset  . Colon cancer Brother 79  . Kidney disease Mother   . Stroke Father   . Multiple  myeloma Sister   . Heart attack Brother   . Other Brother   . Other Brother     SOCHx:   reports that she has never smoked. She has never used smokeless tobacco. She reports that she does not drink alcohol and does not use drugs.  ALLERGIES:  No Known Allergies  ROS: Pertinent items noted in HPI and remainder of comprehensive ROS otherwise negative.  HOME MEDS: Current Outpatient Medications on File Prior to Visit  Medication Sig Dispense Refill  . ALPRAZolam (XANAX) 0.5 MG tablet TAKE 1 TABLET BY MOUTH 3 TIMES DAILY AS NEEDED FOR ANXIETY 90 tablet 2  . amLODipine (NORVASC) 5 MG tablet TAKE ONE TABLET BY MOUTH ONE TIME DAILY 90 tablet 0  . aspirin EC 81 MG EC tablet Take 1 tablet (81 mg total) by mouth daily.    . clopidogrel (PLAVIX) 75 MG tablet TAKE ONE TABLET BY MOUTH ONE TIME DAILY WITH BREAKFAST 90 tablet 0  . estradiol (ESTRACE) 1 MG tablet TAKE ONE TABLET BY MOUTH ONE TIME DAILY  30 tablet 5  . Evolocumab (REPATHA SURECLICK) 932 MG/ML SOAJ Inject 1 Dose into the skin every 14 (fourteen) days. 6 pen 3  . isosorbide mononitrate (IMDUR) 60 MG 24 hr tablet Take 1 tablet (60 mg total) by mouth daily. 90 tablet 3  . lisinopril (ZESTRIL) 5 MG tablet Take 1 tablet (5 mg total) by mouth daily. 90 tablet 3  . metoprolol tartrate (LOPRESSOR) 25 MG tablet Take 1 tablet (25 mg total) by mouth 2 (two) times daily. 180 tablet 3  . nitroGLYCERIN (NITROSTAT) 0.4 MG SL tablet Place 1 tablet (0.4 mg total) under the tongue every 5 (five) minutes as needed for chest pain. 25 tablet 3  . pantoprazole (PROTONIX) 40 MG tablet TAKE ONE TABLET BY MOUTH ONE TIME DAILY 90 tablet 3  . ranolazine (RANEXA) 1000 MG SR tablet TAKE ONE TABLET BY MOUTH TWICE DAILY 180 tablet 0   No current facility-administered medications on file prior to visit.    LABS/IMAGING: No results found for this or any previous visit (from the past 48 hour(s)). No results found.  LIPID PANEL:    Component Value Date/Time    CHOL 175 09/10/2020 0828   TRIG 187 (H) 09/10/2020 0828   HDL 64 09/10/2020 0828   CHOLHDL 2.7 09/10/2020 0828   CHOLHDL 3 03/22/2019 0839   VLDL 35.8 03/22/2019 0839   LDLCALC 80 09/10/2020 0828   LDLDIRECT 128.0 01/12/2017 1455    WEIGHTS: Wt Readings from Last 3 Encounters:  10/10/20 220 lb 6.4 oz (100 kg)  02/14/20 210 lb (95.3 kg)  02/12/20 210 lb 9.6 oz (95.5 kg)    VITALS: BP 118/71   Pulse 70   Ht 5\' 3"  (1.6 m)   Wt 220 lb 6.4 oz (100 kg)   SpO2 98%   BMI 39.04 kg/m   EXAM: Deferred  EKG: Deferred  ASSESSMENT: 1. Mixed dyslipidemia, goal LDL less than 70 2. Statin intolerance-myalgias 3. Coronary artery disease with occluded LAD 4. Mitral valve disease  PLAN: 1.   [name] has had significant cholesterol reduction however her LDL is slightly higher now at 80.  Her target is less than 70.  Triglycerides are slightly elevated as well.  She would like to work aggressively on the diet and exercise to see if she can reach target otherwise we may need to consider additional therapy such as adding ezetimibe to her Repatha since she will not takes statins due to intolerance.  Follow-up with repeat lipids in 6 months.  Pixie Casino, MD, [facility], [place] Director of the Advanced Lipid Disorders &  Cardiovascular Risk Reduction Clinic Diplomate of the American Board of Clinical Lipidology Attending Cardiologist  Direct Dial: [phone]  Fax: [phone]  Website:  [email or web address] 10/10/2020, 11:17 AM

## 2020-10-10 NOTE — Patient Instructions (Signed)
Medication Instructions:  Your physician recommends that you continue on your current medications as directed. Please refer to the Current Medication list given to you today.  *If you need a refill on your cardiac medications before your next appointment, please call your pharmacy*   Lab Work: FASTING lab work to check cholesterol in 6 months  If you have labs (blood work) drawn today and your tests are completely normal, you will receive your results only by: Marland Kitchen MyChart Message (if you have MyChart) OR . A paper copy in the mail If you have any lab test that is abnormal or we need to change your treatment, we will call you to review the results.   Testing/Procedures: NONE   Follow-Up: At [facility], you and your health needs are our priority.  As part of our continuing mission to provide you with exceptional heart care, we have created designated Provider Care Teams.  These Care Teams include your primary Cardiologist (physician) and Advanced Practice Providers (APPs -  Physician Assistants and Nurse Practitioners) who all work together to provide you with the care you need, when you need it.  We recommend signing up for the patient portal called "MyChart".  Sign up information is provided on this After Visit Summary.  MyChart is used to connect with patients for Virtual Visits (Telemedicine).  Patients are able to view lab/test results, encounter notes, upcoming appointments, etc.  Non-urgent messages can be sent to your provider as well.   To learn more about what you can do with MyChart, go to [email or web address].    Your next appointment:   6 months - lipid clinic  The format for your next appointment:   In Person  Provider:   Raliegh Ip Mali Hilty, MD   Other Instructions  Please schedule a routine 1 year follow up (after echo) with Dr. Gwenlyn Found

## 2020-10-11 ENCOUNTER — Encounter: Payer: Self-pay | Admitting: Internal Medicine

## 2020-10-13 ENCOUNTER — Ambulatory Visit (HOSPITAL_COMMUNITY): Payer: Federal, State, Local not specified - PPO | Attending: Cardiology

## 2020-10-13 ENCOUNTER — Other Ambulatory Visit: Payer: Self-pay

## 2020-10-13 DIAGNOSIS — I2510 Atherosclerotic heart disease of native coronary artery without angina pectoris: Secondary | ICD-10-CM | POA: Diagnosis not present

## 2020-10-13 DIAGNOSIS — I340 Nonrheumatic mitral (valve) insufficiency: Secondary | ICD-10-CM | POA: Diagnosis not present

## 2020-10-13 LAB — ECHOCARDIOGRAM COMPLETE
Area-P 1/2: 2.95 cm2
S' Lateral: 2.7 cm

## 2020-10-16 DIAGNOSIS — L2089 Other atopic dermatitis: Secondary | ICD-10-CM | POA: Diagnosis not present

## 2020-10-16 DIAGNOSIS — D692 Other nonthrombocytopenic purpura: Secondary | ICD-10-CM | POA: Diagnosis not present

## 2020-10-28 ENCOUNTER — Other Ambulatory Visit: Payer: Self-pay

## 2020-10-28 ENCOUNTER — Encounter: Payer: Self-pay | Admitting: *Deleted

## 2020-10-28 ENCOUNTER — Ambulatory Visit: Payer: Federal, State, Local not specified - PPO | Admitting: Cardiovascular Disease

## 2020-10-28 ENCOUNTER — Encounter: Payer: Self-pay | Admitting: Cardiovascular Disease

## 2020-10-28 ENCOUNTER — Ambulatory Visit (INDEPENDENT_AMBULATORY_CARE_PROVIDER_SITE_OTHER): Payer: Federal, State, Local not specified - PPO

## 2020-10-28 VITALS — BP 132/74 | Ht 63.0 in | Wt 217.0 lb

## 2020-10-28 DIAGNOSIS — R42 Dizziness and giddiness: Secondary | ICD-10-CM

## 2020-10-28 DIAGNOSIS — E782 Mixed hyperlipidemia: Secondary | ICD-10-CM

## 2020-10-28 DIAGNOSIS — I10 Essential (primary) hypertension: Secondary | ICD-10-CM

## 2020-10-28 NOTE — Progress Notes (Signed)
10/28/2020 [name]   [birth year]  [id number]  Primary Physician Copland, Gay Filler, MD Primary Cardiologist: Lorretta Harp MD Garret Reddish, [place], [place]  HPI:  [name] is a 64 y.o.  who I last saw December of last year with a history of HTN, obesity, and hypothyroidism . I last saw her in the office  10/24/2019.She presented on 12/11/12 after a prolonged episode of angina. Her EKG was without acute changes, but she ruled in for NSTEMI. Her troponin peaked at 4.95. She was placed on IV Heparin and underwent diagnostic coronary angiography. Her initial cardiac catheterization on 5/11, performed by Dr. Ellyn Hack, demonstrated diffuse LAD disease with essentially subtotal occlusion that had the appearance of possible Spontaneous Coronary Artery Dissection. However, with existing CAD, simply diffuse disease was also possible. It was decided to not do any intervention at that time, but to wait and have the patient return, several days later, for a re-look cath to re-evaluate the LAD. She was maintained on medical therapy and had improvement in symtpoms. She returned to the cath lab on 5/16 for re-look. This was also performed by Dr. Ellyn Hack. She was noted to have progression of LAD disease to total occlusion at the original ~95% subtotal location with improved D1-distal LAD and R-L collaterals (septal and distal RPDA). The images were reviewd by Dr. Gwenlyn Found and Dr. Lia Foyer and it was concluded that the best course of action was not to proceed with extensive LAD PTCA-PCI, in the absence of on-going symptoms. Medical therapy was recommended. Since I saw her a year ago she's been doing well until this past July when she was admitted with unstable angina. She ruled out for myocardial infarction. She underwent cardiac catheterization by Dr. Ellyn Hack revealing an occluded LAD in the midportion and otherwise minimal CAD. She did have a 90% small ostial stent second marginal branch stenosis and  60% PLA stenosis with what appeared to be fairly well preserved LV function. She had an apical wall motion abnormality. Since I saw her a year ago she saw remained currently stable specifically denying chest pain or shortness of breath. Her major complaint is of chronic fatigue and lack of energy.She retired from working as a [employer] on 08/01/17 and seems much happier.  She has developed significant dyspnea over the last several months on exertion but really denies chest pain. She is on Imdur and ranolazine. She is a chronically occluded LAD, moderate diagonal branch, obtuse marginal branch and distal dominant RCA disease. She has right to left collaterals.  I obtained a 2D echocardiogram on her 10/11/2019 reviewed which revealed an EF of 50 to 55% with focal wall motion abnormalities in the LAD territory, mild to moderate MR.  Since changing her walking patterns to no longer walking up inclines her shortness of breath is somewhat improved.  Since I saw her a year ago she is remained stable.  She has had 4 episodes of spontaneously falling down for no clear reason.  She really denies syncope.  She also denies chest pain or shortness of breath.  2D echo performed 10/13/2020 showed normal LV systolic function with moderate concentric LVH and no other valvular abnormalities.  She has been working with Dr. Debara Pickett for her lipid abnormality and currently is on Repatha with an LDL of 80.   Current Meds  Medication Sig  . ALPRAZolam (XANAX) 0.5 MG tablet TAKE 1 TABLET BY MOUTH 3 TIMES DAILY AS NEEDED FOR ANXIETY  . amLODipine (NORVASC) 5 MG  tablet TAKE ONE TABLET BY MOUTH ONE TIME DAILY  . aspirin EC 81 MG EC tablet Take 1 tablet (81 mg total) by mouth daily.  . clopidogrel (PLAVIX) 75 MG tablet TAKE ONE TABLET BY MOUTH ONE TIME DAILY WITH BREAKFAST  . estradiol (ESTRACE) 1 MG tablet TAKE ONE TABLET BY MOUTH ONE TIME DAILY   . Evolocumab (REPATHA SURECLICK) 161 MG/ML SOAJ Inject 1 Dose into the skin every 14  (fourteen) days.  . isosorbide mononitrate (IMDUR) 60 MG 24 hr tablet Take 1 tablet (60 mg total) by mouth daily.  Marland Kitchen lisinopril (ZESTRIL) 5 MG tablet Take 1 tablet (5 mg total) by mouth daily.  . metoprolol tartrate (LOPRESSOR) 25 MG tablet Take 1 tablet (25 mg total) by mouth 2 (two) times daily.  . nitroGLYCERIN (NITROSTAT) 0.4 MG SL tablet Place 1 tablet (0.4 mg total) under the tongue every 5 (five) minutes as needed for chest pain.  . pantoprazole (PROTONIX) 40 MG tablet TAKE ONE TABLET BY MOUTH ONE TIME DAILY  . ranolazine (RANEXA) 1000 MG SR tablet TAKE ONE TABLET BY MOUTH TWICE DAILY     No Known Allergies  Social History   Socioeconomic History  . Marital status: Single    Spouse name: Not on file  . Number of children: 2  . Years of education: Not on file  . Highest education level: Not on file  Occupational History  . Occupation: Drug enforcement    CommentPassenger transport manager  Tobacco Use  . Smoking status: Never Smoker  . Smokeless tobacco: Never Used  Vaping Use  . Vaping Use: Never used  Substance and Sexual Activity  . Alcohol use: No  . Drug use: No  . Sexual activity: Never  Other Topics Concern  . Not on file  Social History Narrative   Daily caffeine use.   Social Determinants of Health   Financial Resource Strain: Not on file  Food Insecurity: Not on file  Transportation Needs: Not on file  Physical Activity: Not on file  Stress: Not on file  Social Connections: Not on file  Intimate Partner Violence: Not on file     Review of Systems: General: negative for chills, fever, night sweats or weight changes.  Cardiovascular: negative for chest pain, dyspnea on exertion, edema, orthopnea, palpitations, paroxysmal nocturnal dyspnea or shortness of breath Dermatological: negative for rash Respiratory: negative for cough or wheezing Urologic: negative for hematuria Abdominal: negative for nausea, vomiting, diarrhea, bright red blood per rectum, melena,  or hematemesis Neurologic: negative for visual changes, syncope, or dizziness All other systems reviewed and are otherwise negative except as noted above.    Blood pressure 132/74, height 5\' 3"  (1.6 m), weight 217 lb (98.4 kg), SpO2 94 %.  General appearance: alert and no distress Neck: no adenopathy, no carotid bruit, no JVD, supple, symmetrical, trachea midline and thyroid not enlarged, symmetric, no tenderness/mass/nodules Lungs: clear to auscultation bilaterally Heart: regular rate and rhythm, S1, S2 normal, no murmur, click, rub or gallop Extremities: extremities normal, atraumatic, no cyanosis or edema Pulses: 2+ and symmetric Skin: Skin color, texture, turgor normal. No rashes or lesions Neurologic: Alert and oriented X 3, normal strength and tone. Normal symmetric reflexes. Normal coordination and gait  EKG sinus rhythm at 61 without ST or T wave changes.  There are septal Q waves.  I personally reviewed this EKG.  ASSESSMENT AND PLAN:   Essential hypertension History of essential hypertension blood pressure measured today 132/74.  She is on amlodipine, lisinopril and  metoprolol.  Hyperlipidemia History of hyperlipidemia on Repatha followed by Dr. Debara Pickett.  Her most recent lipid profile performed 09/10/2020 revealed total cholesterol 175, LDL of 80 and HDL of 64.  CAD (coronary artery disease), possible SCAD History of CAD status post non-STEMI 5/11 with a subtotally occluded mid LAD thought to be SCAD.  Intervention was not performed.  She was catheterized subsequent to that and was found to have an occluded L LAD in the midportion without other significant CAD.  Her LV function was well-preserved.  She currently denies chest pain or shortness of breath.  Her last catheterization by Dr. Ellyn Hack 02/06/2015 revealed an occluded LAD, and ostial obtuse marginal branch stenosis in the 90% range 60% distal circumflex stenosis.  Dizziness Over the last 9 months she has had 4 episodes of  spontaneously falling down without true syncope.  I am going to get a 2-week Zio patch to further evaluate.  If this is negative and she has recurrent symptoms she may require loop recorder implantation.      Lorretta Harp MD FACP,FACC,FAHA, [facility] 10/28/2020 2:52 PM

## 2020-10-28 NOTE — Assessment & Plan Note (Signed)
History of essential hypertension blood pressure measured today 132/74.  She is on amlodipine, lisinopril and metoprolol.

## 2020-10-28 NOTE — Assessment & Plan Note (Signed)
Over the last 9 months she has had 4 episodes of spontaneously falling down without true syncope.  I am going to get a 2-week Zio patch to further evaluate.  If this is negative and she has recurrent symptoms she may require loop recorder implantation.

## 2020-10-28 NOTE — Assessment & Plan Note (Signed)
History of CAD status post non-STEMI 5/11 with a subtotally occluded mid LAD thought to be SCAD.  Intervention was not performed.  She was catheterized subsequent to that and was found to have an occluded L LAD in the midportion without other significant CAD.  Her LV function was well-preserved.  She currently denies chest pain or shortness of breath.  Her last catheterization by Dr. Ellyn Hack 02/06/2015 revealed an occluded LAD, and ostial obtuse marginal branch stenosis in the 90% range 60% distal circumflex stenosis.

## 2020-10-28 NOTE — Progress Notes (Signed)
Patient ID: [name], female   DOB: 11/03/1955, 65 y.o.   MRN: [id number] Patient enrolled for [employer] to ship a 14 day ZIO XT long term holter monitor to her home.

## 2020-10-28 NOTE — Patient Instructions (Addendum)
Medication Instructions:  Your physician recommends that you continue on your current medications as directed. Please refer to the Current Medication list given to you today.  *If you need a refill on your cardiac medications before your next appointment, please call your pharmacy*   Testing/Procedures:  [place] Monitor Instructions   Your physician has requested you wear your ZIO patch monitor 14 days.   This is a single patch monitor.  [employer] supplies one patch monitor per enrollment.  Additional stickers are not available.   Please do not apply patch if you will be having a Nuclear Stress Test, Echocardiogram, Cardiac CT, MRI, or Chest Xray during the time frame you would be wearing the monitor. The patch cannot be worn during these tests.  You cannot remove and re-apply the ZIO XT patch monitor.   Your ZIO patch monitor will be sent USPS Priority mail from [facility] directly to your home address. The monitor may also be mailed to a PO BOX if home delivery is not available.   It may take 3-5 days to receive your monitor after you have been enrolled.   Once you have received you monitor, please review enclosed instructions.  Your monitor has already been registered assigning a specific monitor serial # to you.   Applying the monitor   Shave hair from upper left chest.   Hold abrader disc by orange tab.  Rub abrader in 40 strokes over left upper chest as indicated in your monitor instructions.   Clean area with 4 enclosed alcohol pads .  Use all pads to assure are is cleaned thoroughly.  Let dry.   Apply patch as indicated in monitor instructions.  Patch will be place under collarbone on left side of chest with arrow pointing upward.   Rub patch adhesive wings for 2 minutes.Remove white label marked "1".  Remove white label marked "2".  Rub patch adhesive wings for 2 additional minutes.   While looking in a mirror, press and release button in center of patch.  A  small green light will flash 3-4 times .  This will be your only indicator the monitor has been turned on.     Do not shower for the first 24 hours.  You may shower after the first 24 hours.   Press button if you feel a symptom. You will hear a small click.  Record Date, Time and Symptom in the Patient Log Book.   When you are ready to remove patch, follow instructions on last 2 pages of Patient Log Book.  Stick patch monitor onto last page of Patient Log Book.   Place Patient Log Book in [place] box.  Use locking tab on box and tape box closed securely.  The [employer] has [employer] on it.  Please place in mailbox as soon as possible.  Your physician should have your test results approximately 7 days after the monitor has been mailed back to [facility].   Call [facility] at [phone] if you have questions regarding your ZIO XT patch monitor.  Call them immediately if you see an orange light blinking on your monitor.   If your monitor falls off in less than 4 days contact our Monitor department at [phone].  If your monitor becomes loose or falls off after 4 days call [employer] at [phone] for suggestions on securing your monitor.     Follow-Up: At [facility], you and your health needs are our priority.  As part of our continuing mission to provide you with exceptional heart care, we have created designated Provider Care Teams.  These Care Teams include your primary Cardiologist (physician) and Advanced Practice Providers (APPs -  Physician Assistants and Nurse Practitioners) who all work together to provide you with the care you need, when you need it.  We recommend signing up for the patient portal called "MyChart".  Sign up information is provided on this After Visit Summary.  MyChart is used to connect with patients for Virtual Visits (Telemedicine).  Patients are able to view lab/test results, encounter notes, upcoming appointments, etc.   Non-urgent messages can be sent to your provider as well.   To learn more about what you can do with MyChart, go to [email or web address].    Your next appointment:   12 month(s)  The format for your next appointment:   In Person  Provider:   Quay Burow, MD

## 2020-10-28 NOTE — Assessment & Plan Note (Signed)
History of hyperlipidemia on Repatha followed by Dr. Debara Pickett.  Her most recent lipid profile performed 09/10/2020 revealed total cholesterol 175, LDL of 80 and HDL of 64.

## 2020-10-30 ENCOUNTER — Other Ambulatory Visit: Payer: Self-pay | Admitting: Cardiovascular Disease

## 2020-11-01 DIAGNOSIS — I10 Essential (primary) hypertension: Secondary | ICD-10-CM

## 2020-11-01 DIAGNOSIS — E782 Mixed hyperlipidemia: Secondary | ICD-10-CM | POA: Diagnosis not present

## 2020-11-01 DIAGNOSIS — R42 Dizziness and giddiness: Secondary | ICD-10-CM | POA: Diagnosis not present

## 2020-11-05 NOTE — Addendum Note (Signed)
Addended by: Merri Ray A on: 11/05/2020 04:24 PM   Modules accepted: Orders

## 2020-11-08 ENCOUNTER — Other Ambulatory Visit: Payer: Self-pay | Admitting: Internal Medicine

## 2020-12-28 NOTE — Patient Instructions (Addendum)
It was good to see you again today, I will be in touch with your labs asap  We will also set up a gallbladder ultrasound for you If you don't have gallstones, I wonder if you might be having episodes of diverticulitis we would have to do a CT during an episode to prove this theory!    Please keep me posted and take care

## 2020-12-28 NOTE — Progress Notes (Addendum)
[facility] at [employer] [address], [place], [place] [address] [id number] [phone]  Date:  12/31/2020   Name:  [name]   DOB:  Feb 03, 1956   MRN:  [id number]  PCP:  Darreld Mclean, MD    Chief Complaint: Abdominal Pain (Nausea, sweating, diarrhea, right side abdominal pain, rash-unsure if related no known fever)   History of Present Illness:  [name] is a 65 y.o. very pleasant female patient who presents with the following:  Pt here today with concern about a gallbladder problem Last visit with myself in July  History of HTN, CAD/NSTEMI, obesity, hyperlipidemia, IBS, hypothyroidism  Today she notes intermittent nausea and sweating, no vomiting- she will also get epigastric pain but not always related to the nausea episodes She may get diarrhea when this occurs She has noted this for about 3 months Might be related to eating but not always No CP or SOB, no chest pressure  She may also get a right trunk rash which has been present for several months She will use a cortisone cream and it clears up temporarily   These episodes may occur every couple of weeks and may last all day    Most recent visit with cardiology in March, Dr Gwenlyn Found: Essential hypertension History of essential hypertension blood pressure measured today 132/74.  She is on amlodipine, lisinopril and metoprolol. Hyperlipidemia History of hyperlipidemia on Repatha followed by Dr. Debara Pickett.  Her most recent lipid profile performed 09/10/2020 revealed total cholesterol 175, LDL of 80 and HDL of 64. CAD (coronary artery disease), possible SCAD History of CAD status post non-STEMI 5/11 with a subtotally occluded mid LAD thought to be SCAD.  Intervention was not performed.  She was catheterized subsequent to that and was found to have an occluded L LAD in the midportion without other significant CAD.  Her LV function was well-preserved.  She currently denies  chest pain or shortness of breath.  Her last catheterization by Dr. Ellyn Hack 02/06/2015 revealed an occluded LAD, and ostial obtuse marginal branch stenosis in the 90% range 60% distal circumflex stenosis. Dizziness Over the last 9 months she has had 4 episodes of spontaneously falling down without true syncope.  I am going to get a 2-week Zio patch to further evaluate.  If this is negative and she has recurrent symptoms she may require loop recorder implantation.  Pap- per her gyn provider  shingrix mammo-she has set up next month per her GYN- PFW dexa- this is done per her GYN covid booster-encouraged her to do this Colon UTD-2019, no diverticulosis at that time I have requested her GYN records  Most recent labs about a year ago, except lipids in February  Lab Results  Component Value Date   TSH 2.82 02/14/2020    Lab Results  Component Value Date   HGBA1C 5.5 02/14/2020     Patient Active Problem List   Diagnosis Date Noted  . Dyspnea on exertion 10/24/2019  . Pre-diabetes 01/14/2017  . 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 Tachycardia, somewhat irregular, episodic 10/08/2013  . Dizziness 10/08/2013  . Angina effort 05/22/2013  . Unstable angina ([place]) 01/22/2013  . Hyperlipidemia 01/01/2013  . CAD (coronary artery disease), possible SCAD 12/14/2012  . NSTEMI (non-ST elevated myocardial infarction) ([place]) 12/10/2012  . Essential hypertension 12/10/2012  . Hypothyroidism 12/10/2012  . Obesity (BMI 35.0-39.9 without comorbidity) 12/10/2012  . Gastroenteritis 10/08/2011  . Acute ischemic colitis ([place]) 08/09/2011  .  DYSPHAGIA UNSPECIFIED 07/01/2010  . RECTAL BLEEDING Jan 2013 11/04/2009  . GERD 02/11/2009  . Irritable bowel syndrome 02/11/2009  . PERSONAL HX COLONIC POLYPS 02/11/2009    Past Medical History:  Diagnosis Date  . Angina effort 05/22/2013  . Anxiety   . CAD (coronary artery disease), possible SCAD 12/14/2012  . Colon polyps   . Duodenitis   .  Dyspnea on exertion    LEXISCAN, 09/30/2008 - normal, EKG negative for ischemia, no ECG changes  . Esophageal stricture   . Gastritis   . GERD (gastroesophageal reflux disease)   . Gout    "only from RX given after MI" (01/22/2013)  . H/O hiatal hernia   . History of esophageal stricture   . Hypertension    2D ECHO, 09/30/2008 - EF >55%, normal: RENAL DOPPLER, 03/21/2007 - normal duplex study  . Hypothyroidism   . IBS (irritable bowel syndrome)   . Ischemic colitis ([place])   . Lower GI bleeding    10/07/11 "I've had 3 episodes in the past year"  . Myocardial infarction [facility]) 12/10/2012   "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)  . Tubular adenoma polyp of rectum 2008    Past Surgical History:  Procedure Laterality Date  . ABDOMINAL HYSTERECTOMY  1999  . ANTERIOR LUMBAR FUSION  2010   "L4-5" (01/22/2013)  . BACK SURGERY  09/2008   FUSION/l4-5  . CARDIAC CATHETERIZATION N/A 02/06/2015   Procedure: Left Heart Cath and Coronary Angiography;  Surgeon: Leonie Man, MD;  Location: [facility] CV LAB;  Service: Cardiovascular;  Laterality: N/A;  . ESOPHAGEAL DILATION  08/2010  . ESOPHAGEAL DILATION     "once or twice" (01/22/2013)  . HAMMER TOE SURGERY Bilateral ~ 2002  . LEFT HEART CATHETERIZATION WITH CORONARY ANGIOGRAM N/A 12/11/2012   Procedure: LEFT HEART CATHETERIZATION WITH CORONARY ANGIOGRAM;  Surgeon: Leonie Man, MD;  Location: [facility] CATH LAB;  Service: Cardiovascular;  Laterality: N/A;  . LEFT HEART CATHETERIZATION WITH CORONARY ANGIOGRAM N/A 12/15/2012   Procedure: LEFT HEART CATHETERIZATION WITH CORONARY ANGIOGRAM;  Surgeon: Leonie Man, MD;  Location: [facility] CATH LAB;  Service: Cardiovascular;  Laterality: N/A;  . PERIPHERAL VENOUS STUDY  10/03/2008   No evidence of DVT, superficial thrombosis, or Baker's cyst  . TONSILLECTOMY AND ADENOIDECTOMY  1960's  . TUBAL LIGATION  1985    Social History   Tobacco Use  . Smoking status: Never Smoker  . Smokeless tobacco: Never  Used  Vaping Use  . Vaping Use: Never used  Substance Use Topics  . Alcohol use: No  . Drug use: No    Family History  Problem Relation Age of Onset  . Colon cancer Brother 78  . Kidney disease Mother   . Stroke Father   . Multiple myeloma Sister   . Heart attack Brother   . Other Brother   . Other Brother     No Known Allergies  Medication list has been reviewed and updated.  Current Outpatient Medications on File Prior to Visit  Medication Sig Dispense Refill  . ALPRAZolam (XANAX) 0.5 MG tablet TAKE 1 TABLET BY MOUTH 3 TIMES DAILY AS NEEDED FOR ANXIETY 90 tablet 2  . amLODipine (NORVASC) 5 MG tablet TAKE ONE TABLET BY MOUTH ONE TIME DAILY 90 tablet 3  . aspirin EC 81 MG EC tablet Take 1 tablet (81 mg total) by mouth daily.    . clopidogrel (PLAVIX) 75 MG tablet Take 1 tablet by mouth once a day with breakfast 90 tablet 3  .  estradiol (ESTRACE) 1 MG tablet TAKE ONE TABLET BY MOUTH ONE TIME DAILY  30 tablet 5  . isosorbide mononitrate (IMDUR) 60 MG 24 hr tablet Take 1 tablet (60 mg total) by mouth daily. 90 tablet 3  . lisinopril (ZESTRIL) 5 MG tablet Take 1 tablet (5 mg total) by mouth daily. 90 tablet 3  . metoprolol tartrate (LOPRESSOR) 25 MG tablet Take 1 tablet (25 mg total) by mouth 2 (two) times daily. 180 tablet 3  . nitroGLYCERIN (NITROSTAT) 0.4 MG SL tablet Place 1 tablet (0.4 mg total) under the tongue every 5 (five) minutes as needed for chest pain. 25 tablet 3  . pantoprazole (PROTONIX) 40 MG tablet TAKE ONE TABLET BY MOUTH ONE TIME DAILY 90 tablet 3  . ranolazine (RANEXA) 1000 MG SR tablet TAKE ONE TABLET BY MOUTH TWICE DAILY 180 tablet 0  . REPATHA SURECLICK 341 MG/ML SOAJ INJECT 140MG INTO THE SKIN ONCE EVERY 14 DAYS 2 mL 11   No current facility-administered medications on file prior to visit.    Review of Systems:  As per HPI- otherwise negative.   Physical Examination: Vitals:   12/31/20 1038  BP: 122/64  Pulse: 67  Resp: 17  Temp: 97.8 F (36.6  C)  SpO2: 98%   Vitals:   12/31/20 1038  Weight: 221 lb (100.2 kg)  Height: _0  (1.6 m)   Body mass index is 39.15 kg/m. Ideal Body Weight: Weight in (lb) to have BMI = 25: 140.8  GEN: no acute distress.  Obese, otherwise looks well HEENT: Atraumatic, Normocephalic.  Ears and Nose: No external deformity. CV: RRR, No M/G/R. No JVD. No thrill. No extra heart sounds. PULM: CTA B, no wheezes, crackles, rhonchi. No retractions. No resp. distress. No accessory muscle use. ABD: S, ND, +BS. No rebound. No HSM.  Minimal epigastric tenderness is present, no rash at this time Negative Murphy sign EXTR: No c/c/e PSYCH: Normally interactive. Conversant.    Assessment and Plan: Essential hypertension - Plan: CBC, Comprehensive metabolic panel  Screening for deficiency anemia - Plan: CBC  Pre-diabetes - Plan: Hemoglobin A1c  Coronary artery disease involving native coronary artery of native heart without angina pectoris  Hypothyroidism, unspecified type - Plan: VITAMIN D 25 Hydroxy (Vit-D Deficiency, Fractures)  Fatigue, unspecified type - Plan: TSH  Generalized abdominal pain - Plan: US Abdomen Limited RUQ (LIVER/GB), Lipase  Patient today with concern of abdominal pain which is episodic, may be associated with nausea and diarrhea She wonders if this may be her gallbladder which is certainly possible.  Also consider diverticulitis Labs and right upper quadrant ultrasound pending Blood pressure well controlled If ultrasound and labs are unrevealing, we can try to do a CT scan during an episode of possible She is advised to seek care at the emergency room if any severe or worsening symptoms in the meantime  This visit occurred during the SARS-CoV-2 public health emergency.  Safety protocols were in place, including screening questions prior to the visit, additional usage of staff PPE, and extensive cleaning of exam room while observing appropriate contact time as indicated for  disinfecting solutions.    Signed Lamar Blinks, MD   Received her labs as below, she does not have my chart so we will have to call Creatinine, GFR slightly out of baseline No leukocytosis A1c is stable, prediabetes Vitamin D low, will suggest oral replacement Lipase okay 6/2-  Called her and went over results She is taking 2000 iu vitamin D- advised she can increase  to 3000  encouraged her to schedule her RUQ [place] asap  Results for orders placed or performed in visit on 12/31/20  CBC  Result Value Ref Range   WBC 7.5 4.0 - 10.5 K/uL   RBC 3.81 (L) 3.87 - 5.11 Mil/uL   Platelets 256.0 150.0 - 400.0 K/uL   Hemoglobin 12.1 12.0 - 15.0 g/dL   HCT 35.1 (L) 36.0 - 46.0 %   MCV 92.2 78.0 - 100.0 fl   MCHC 34.4 30.0 - 36.0 g/dL   RDW 13.2 11.5 - 15.5 %  Comprehensive metabolic panel  Result Value Ref Range   Sodium 137 135 - 145 mEq/L   Potassium 4.1 3.5 - 5.1 mEq/L   Chloride 103 96 - 112 mEq/L   CO2 25 19 - 32 mEq/L   Glucose, Bld 102 (H) 70 - 99 mg/dL   BUN 22 6 - 23 mg/dL   Creatinine, Ser 1.29 (H) 0.40 - 1.20 mg/dL   Total Bilirubin 0.5 0.2 - 1.2 mg/dL   Alkaline Phosphatase 73 39 - 117 U/L   AST 12 0 - 37 U/L   ALT 9 0 - 35 U/L   Total Protein 6.4 6.0 - 8.3 g/dL   Albumin 3.6 3.5 - 5.2 g/dL   GFR 43.78 (L) >60.00 mL/min   Calcium 9.4 8.4 - 10.5 mg/dL  Hemoglobin A1c  Result Value Ref Range   Hgb A1c MFr Bld 5.7 4.6 - 6.5 %  TSH  Result Value Ref Range   TSH 3.45 0.35 - 4.50 uIU/mL  VITAMIN D 25 Hydroxy (Vit-D Deficiency, Fractures)  Result Value Ref Range   VITD 25.22 (L) 30.00 - 100.00 ng/mL  Lipase  Result Value Ref Range   Lipase 13.0 11.0 - 59.0 U/L

## 2020-12-31 ENCOUNTER — Other Ambulatory Visit: Payer: Self-pay

## 2020-12-31 ENCOUNTER — Ambulatory Visit: Payer: Federal, State, Local not specified - PPO | Admitting: Family Medicine

## 2020-12-31 ENCOUNTER — Encounter: Payer: Self-pay | Admitting: Family Medicine

## 2020-12-31 VITALS — BP 122/64 | HR 67 | Temp 97.8°F | Resp 17 | Ht 63.0 in | Wt 221.0 lb

## 2020-12-31 DIAGNOSIS — R5383 Other fatigue: Secondary | ICD-10-CM | POA: Diagnosis not present

## 2020-12-31 DIAGNOSIS — I2510 Atherosclerotic heart disease of native coronary artery without angina pectoris: Secondary | ICD-10-CM

## 2020-12-31 DIAGNOSIS — Z130 Encounter for screening for diseases of the blood and blood-forming organs and certain disorders involving the immune mechanism: Secondary | ICD-10-CM | POA: Diagnosis not present

## 2020-12-31 DIAGNOSIS — R7303 Prediabetes: Secondary | ICD-10-CM | POA: Diagnosis not present

## 2020-12-31 DIAGNOSIS — R1084 Generalized abdominal pain: Secondary | ICD-10-CM | POA: Diagnosis not present

## 2020-12-31 DIAGNOSIS — E039 Hypothyroidism, unspecified: Secondary | ICD-10-CM | POA: Diagnosis not present

## 2020-12-31 DIAGNOSIS — I10 Essential (primary) hypertension: Secondary | ICD-10-CM

## 2020-12-31 LAB — COMPREHENSIVE METABOLIC PANEL
ALT: 9 U/L (ref 0–35)
AST: 12 U/L (ref 0–37)
Albumin: 3.6 g/dL (ref 3.5–5.2)
Alkaline Phosphatase: 73 U/L (ref 39–117)
BUN: 22 mg/dL (ref 6–23)
CO2: 25 mEq/L (ref 19–32)
Calcium: 9.4 mg/dL (ref 8.4–10.5)
Chloride: 103 mEq/L (ref 96–112)
Creatinine, Ser: 1.29 mg/dL — ABNORMAL HIGH (ref 0.40–1.20)
GFR: 43.78 mL/min — ABNORMAL LOW (ref >60.00)
Glucose, Bld: 102 mg/dL — ABNORMAL HIGH (ref 70–99)
Potassium: 4.1 mEq/L (ref 3.5–5.1)
Sodium: 137 mEq/L (ref 135–145)
Total Bilirubin: 0.5 mg/dL (ref 0.2–1.2)
Total Protein: 6.4 g/dL (ref 6.0–8.3)

## 2020-12-31 LAB — CBC
HCT: 35.1 % — ABNORMAL LOW (ref 36.0–46.0)
Hemoglobin: 12.1 g/dL (ref 12.0–15.0)
MCHC: 34.4 g/dL (ref 30.0–36.0)
MCV: 92.2 fl (ref 78.0–100.0)
Platelets: 256 10*3/uL (ref 150.0–400.0)
RBC: 3.81 Mil/uL — ABNORMAL LOW (ref 3.87–5.11)
RDW: 13.2 % (ref 11.5–15.5)
WBC: 7.5 10*3/uL (ref 4.0–10.5)

## 2020-12-31 LAB — TSH: TSH: 3.45 u[IU]/mL (ref 0.35–4.50)

## 2020-12-31 LAB — LIPASE: Lipase: 13 U/L (ref 11.0–59.0)

## 2020-12-31 LAB — HEMOGLOBIN A1C: Hgb A1c MFr Bld: 5.7 % (ref 4.6–6.5)

## 2020-12-31 LAB — VITAMIN D 25 HYDROXY (VIT D DEFICIENCY, FRACTURES): VITD: 25.22 ng/mL — ABNORMAL LOW (ref 30.00–100.00)

## 2021-01-12 ENCOUNTER — Ambulatory Visit (HOSPITAL_BASED_OUTPATIENT_CLINIC_OR_DEPARTMENT_OTHER)
Admission: RE | Admit: 2021-01-12 | Discharge: 2021-01-12 | Disposition: A | Discharge status: Home / Self Care | Payer: Federal, State, Local not specified - PPO | Source: Ambulatory Visit | Attending: Family Medicine | Admitting: Family Medicine

## 2021-01-12 ENCOUNTER — Other Ambulatory Visit: Payer: Self-pay | Admitting: Cardiovascular Disease

## 2021-01-12 ENCOUNTER — Other Ambulatory Visit: Payer: Self-pay

## 2021-01-12 DIAGNOSIS — R109 Unspecified abdominal pain: Secondary | ICD-10-CM | POA: Diagnosis not present

## 2021-01-12 DIAGNOSIS — R1084 Generalized abdominal pain: Secondary | ICD-10-CM | POA: Diagnosis not present

## 2021-01-13 ENCOUNTER — Telehealth: Payer: Self-pay | Admitting: Family Medicine

## 2021-01-13 NOTE — Telephone Encounter (Signed)
Called her and gave [place] report GB is negative She is going to schedule to see Dr Henrene Pastor her GI doc about her sx/  however if another flare up I can also order a CT for her

## 2021-01-16 ENCOUNTER — Encounter: Payer: Self-pay | Admitting: Family Medicine

## 2021-01-21 ENCOUNTER — Encounter: Payer: Self-pay | Admitting: Gastroenterology

## 2021-01-21 ENCOUNTER — Ambulatory Visit: Payer: Federal, State, Local not specified - PPO | Admitting: Gastroenterology

## 2021-01-21 VITALS — BP 130/72 | HR 64 | Ht 63.0 in | Wt 221.0 lb

## 2021-01-21 DIAGNOSIS — R1030 Lower abdominal pain, unspecified: Secondary | ICD-10-CM | POA: Diagnosis not present

## 2021-01-21 DIAGNOSIS — R1084 Generalized abdominal pain: Secondary | ICD-10-CM

## 2021-01-21 NOTE — Progress Notes (Signed)
01/21/2021 [name] [id number] 1956-01-06   HISTORY OF PRESENT ILLNESS:  This is a 65 year old female who is a patient of Dr. Blanch Media, last seen here in 01/2020. She has been followed in this office for GERD complicated by peptic stricture requiring esophageal dilation, history of adenomatous colon polyps as well as a family history of colon cancer, alternating IBS, and a possible history of ischemic colitis.  Colonoscopy 03/2018 showed the following:  - Two 1 to 3 mm polyps in the rectum and in the cecum, removed with a cold snare. Resected and retrieved. - The examination was otherwise normal on direct and retroflexion views.  Pathology showed tubular adenomas.  EGD 03/2018 showed the following:  - One benign-appearing, intrinsic mild stenosis was found 40 cm from the incisors. The scope was withdrawn. Dilation was performed with a Maloney dilator with no resistance at 41 Fr. - The exam of the esophagus was otherwise normal. - The stomach was normal. - The examined duodenum was normal. - The cardia and gastric fundus were normal on retroflexion.  She is here today with complaints of episodic nausea and abdominal pain.  She tells me that she gets these sporadic episodes of nausea and abdominal pain.  She says that they have become more frequent and she has had about 4 or 5 in the past few months.  Not necessarily related to eating.  She says that when it occurs she gets a lot of nausea and she breaks out in a sweat, but does not have any vomiting.  She gets a lot of cramping in her mid abdomen and then gets diarrhea.  She does have some bright red blood that she sees with the stool when she is having diarrhea.  Her PCP recently ordered an ultrasound of the right upper quadrant that was unremarkable.  Recent basic labs also unremarkable.  She remains on pantoprazole 40 mg once daily for her acid reflux related issues.  Past Medical History:  Diagnosis Date   Angina effort  05/22/2013   Anxiety    CAD (coronary artery disease), possible SCAD 12/14/2012   Colon polyps    Duodenitis    Dyspnea on exertion    LEXISCAN, 09/30/2008 - normal, EKG negative for ischemia, no ECG changes   Esophageal stricture    Gastritis    GERD (gastroesophageal reflux disease)    Gout    "only from RX given after MI" (01/22/2013)   H/O hiatal hernia    History of esophageal stricture    Hypertension    2D ECHO, 09/30/2008 - EF >55%, normal: RENAL DOPPLER, 03/21/2007 - normal duplex study   Hypothyroidism    IBS (irritable bowel syndrome)    Ischemic colitis ([place])    Lower GI bleeding    10/07/11 "I've had 3 episodes in the past year"   Myocardial infarction ([place]) 12/10/2012   "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)   Tubular adenoma polyp of rectum 2008   Past Surgical History:  Procedure Laterality Date   ABDOMINAL HYSTERECTOMY  1999   ANTERIOR LUMBAR FUSION  2010   "L4-5" (01/22/2013)   BACK SURGERY  09/2008   FUSION/l4-5   CARDIAC CATHETERIZATION N/A 02/06/2015   Procedure: Left Heart Cath and Coronary Angiography;  Surgeon: Leonie Man, MD;  Location: [place] CV LAB;  Service: Cardiovascular;  Laterality: N/A;   ESOPHAGEAL DILATION  08/2010   ESOPHAGEAL DILATION     "once or twice" (01/22/2013)   HAMMER TOE SURGERY  Bilateral ~ 2002   LEFT HEART CATHETERIZATION WITH CORONARY ANGIOGRAM N/A 12/11/2012   Procedure: LEFT HEART CATHETERIZATION WITH CORONARY ANGIOGRAM;  Surgeon: Leonie Man, MD;  Location[facility] CATH LAB;  Service: Cardiovascular;  Laterality: N/A;   LEFT HEART CATHETERIZATION WITH CORONARY ANGIOGRAM N/A 12/15/2012   Procedure: LEFT HEART CATHETERIZATION WITH CORONARY ANGIOGRAM;  Surgeon: Leonie Man, MD;  Location: [facility] CATH LAB;  Service: Cardiovascular;  Laterality: N/A;   PERIPHERAL VENOUS STUDY  10/03/2008   No evidence of DVT, superficial thrombosis, or Baker's cyst   TONSILLECTOMY AND ADENOIDECTOMY  1960's   [place]     reports that she has never smoked. She has never used smokeless tobacco. She reports that she does not drink alcohol and does not use drugs. family history includes Colon cancer (age of onset: 58) in her brother; Heart attack in her brother; Kidney disease in her mother; Multiple myeloma in her sister; Other in her brother and brother; Stroke in her father. No Known Allergies    Outpatient Encounter Medications as of 01/21/2021  Medication Sig   ALPRAZolam (XANAX) 0.5 MG tablet TAKE 1 TABLET BY MOUTH 3 TIMES DAILY AS NEEDED FOR ANXIETY   amLODipine (NORVASC) 5 MG tablet TAKE ONE TABLET BY MOUTH ONE TIME DAILY   aspirin EC 81 MG EC tablet Take 1 tablet (81 mg total) by mouth daily.   clopidogrel (PLAVIX) 75 MG tablet Take 1 tablet by mouth once a day with breakfast   estradiol (ESTRACE) 1 MG tablet TAKE ONE TABLET BY MOUTH ONE TIME DAILY    isosorbide mononitrate (IMDUR) 60 MG 24 hr tablet Take 1 tablet (60 mg total) by mouth daily.   lisinopril (ZESTRIL) 5 MG tablet Take 1 tablet (5 mg total) by mouth daily.   metoprolol tartrate (LOPRESSOR) 25 MG tablet Take 1 tablet (25 mg total) by mouth 2 (two) times daily.   nitroGLYCERIN (NITROSTAT) 0.4 MG SL tablet Place 1 tablet (0.4 mg total) under the tongue every 5 (five) minutes as needed for chest pain.   pantoprazole (PROTONIX) 40 MG tablet TAKE ONE TABLET BY MOUTH ONE TIME DAILY   ranolazine (RANEXA) 1000 MG SR tablet TAKE ONE TABLET BY MOUTH TWICE DAILY   REPATHA SURECLICK 341 MG/ML SOAJ INJECT 140MG INTO THE SKIN ONCE EVERY 14 DAYS   No facility-administered encounter medications on file as of 01/21/2021.    REVIEW OF SYSTEMS  : All other systems reviewed and negative except where noted in the History of Present Illness.   PHYSICAL EXAM: BP 130/72   Pulse 64   Ht _0  (1.6 m)   Wt 221 lb (100.2 kg)   BMI 39.15 kg/m  General: Well developed white female in no acute distress Head: Normocephalic and atraumatic Eyes:  Sclerae anicteric,  conjunctiva pink. Ears: Normal auditory acuity Lungs: Clear throughout to auscultation; no W/R/R. Heart: Regular rate and rhythm; no M/R/G. Abdomen: Soft, non-distended.  BS present.  Mild diffuse TTP. Musculoskeletal: Symmetrical with no gross deformities  Skin: No lesions on visible extremities Extremities: No edema  Neurological: Alert oriented x 4, grossly non-focal Psychological:  Alert and cooperative. Normal mood and affect  ASSESSMENT AND PLAN: *65 year old female with complaints of episodic nausea with associated mid abdominal pain and diaphoresis, sometimes followed by diarrhea: Has had about 4-5 episodes in the past few months.  Very distressing when they occur.  Basic labs and ultrasound unremarkable.  Has history of suspected ischemic colitis as well as duodenitis.  We  will plan for CT scan of the abdomen and pelvis.  CC:  Copland, Gay Filler, MD

## 2021-01-21 NOTE — Patient Instructions (Addendum)
You have been scheduled for a CT scan of the abdomen and pelvis at [facility] ([address]---this is in the same building as [employer]).   You are scheduled on Monday 01/26/21 at 2:30 pm. You should arrive 15 minutes prior to your appointment time for registration. Please follow the written instructions below on the day of your exam:  WARNING: IF YOU ARE ALLERGIC TO IODINE/X-RAY DYE, PLEASE NOTIFY RADIOLOGY IMMEDIATELY AT [phone]! YOU WILL BE GIVEN A 13 HOUR PREMEDICATION PREP.  1) Do not eat or drink anything after 10:30 am (4 hours prior to your test) 2) You have been given 2 bottles of oral contrast to drink. The solution may taste better if refrigerated, but do NOT add ice or any other liquid to this solution. Shake well before drinking.    Drink 1 bottle of contrast @ 12:30 pm (2 hours prior to your exam)  Drink 1 bottle of contrast @ 1:30 pm (1 hour prior to your exam)  You may take any medications as prescribed with a small amount of water, if necessary. If you take any of the following medications: METFORMIN, GLUCOPHAGE, GLUCOVANCE, AVANDAMET, RIOMET, FORTAMET, [place] MET, JANUMET, GLUMETZA or METAGLIP, you MAY be asked to HOLD this medication 48 hours AFTER the exam.  The purpose of you drinking the oral contrast is to aid in the visualization of your intestinal tract. The contrast solution may cause some diarrhea. Depending on your individual set of symptoms, you may also receive an intravenous injection of x-ray contrast/dye. Plan on being at [facility] for 30 minutes or longer, depending on the type of exam you are having performed.  This test typically takes 30-45 minutes to complete.  If you have any questions regarding your exam or if you need to reschedule, you may call the CT department at [phone] between the hours of 8:00 am and 5:00 pm, Monday-Friday.  ________________________________________________________________________

## 2021-01-26 ENCOUNTER — Ambulatory Visit (INDEPENDENT_AMBULATORY_CARE_PROVIDER_SITE_OTHER)
Admission: RE | Admit: 2021-01-26 | Discharge: 2021-01-26 | Disposition: A | Discharge status: Home / Self Care | Payer: Federal, State, Local not specified - PPO | Source: Ambulatory Visit | Attending: Gastroenterology | Admitting: Gastroenterology

## 2021-01-26 ENCOUNTER — Other Ambulatory Visit: Payer: Self-pay

## 2021-01-26 DIAGNOSIS — K6389 Other specified diseases of intestine: Secondary | ICD-10-CM | POA: Diagnosis not present

## 2021-01-26 DIAGNOSIS — R1084 Generalized abdominal pain: Secondary | ICD-10-CM | POA: Diagnosis not present

## 2021-01-26 DIAGNOSIS — R61 Generalized hyperhidrosis: Secondary | ICD-10-CM | POA: Diagnosis not present

## 2021-01-26 DIAGNOSIS — M47816 Spondylosis without myelopathy or radiculopathy, lumbar region: Secondary | ICD-10-CM | POA: Diagnosis not present

## 2021-01-26 DIAGNOSIS — R1030 Lower abdominal pain, unspecified: Secondary | ICD-10-CM | POA: Diagnosis not present

## 2021-01-26 DIAGNOSIS — K529 Noninfective gastroenteritis and colitis, unspecified: Secondary | ICD-10-CM | POA: Diagnosis not present

## 2021-01-28 ENCOUNTER — Encounter: Payer: Self-pay | Admitting: Gastroenterology

## 2021-01-28 DIAGNOSIS — R1030 Lower abdominal pain, unspecified: Secondary | ICD-10-CM | POA: Insufficient documentation

## 2021-01-28 NOTE — Progress Notes (Signed)
Assessment and plan noted ?

## 2021-01-29 ENCOUNTER — Other Ambulatory Visit: Payer: Self-pay

## 2021-01-29 DIAGNOSIS — R935 Abnormal findings on diagnostic imaging of other abdominal regions, including retroperitoneum: Secondary | ICD-10-CM

## 2021-01-30 ENCOUNTER — Telehealth: Payer: Self-pay | Admitting: Gastroenterology

## 2021-01-30 NOTE — Telephone Encounter (Signed)
Inbound call from patient returning call. Best contact number [phone]

## 2021-01-30 NOTE — Telephone Encounter (Signed)
Patient notified of the CT results and need for MRI entero.  Patient is aware of the MRI scheduled for 02/17/21.  Discussed with Alonza Bogus, PA (see results of CT scan).  Patient needs MRI entero to eval abnormal CT small bowel.

## 2021-01-30 NOTE — Telephone Encounter (Signed)
Attempted to return call.  Phone line busy x 3.

## 2021-02-03 ENCOUNTER — Telehealth: Payer: Self-pay

## 2021-02-03 NOTE — Telephone Encounter (Signed)
Zehr, Laban Emperor, [name]  Timothy Lasso, RN Please let the patient know that her CT scan shows an area of thickening that we think is probably inflammation in her small intestine.  I discussed with Dr. Henrene Pastor.  He would like her to have an MR enterography, which is an MRI specific for the small intestine to evaluate this further.  Please schedule that if she is agreeable in order to evaluate the small bowel abnormality seen on CT scan.   Thank you,   Jess

## 2021-02-03 NOTE — Telephone Encounter (Signed)
Left message on machine to call back  

## 2021-02-04 NOTE — Telephone Encounter (Signed)
The pt has been advised and is aware of the recommendation.  She has been scheduled and all questions answered.

## 2021-02-09 ENCOUNTER — Other Ambulatory Visit: Payer: Self-pay | Admitting: *Deleted

## 2021-02-09 DIAGNOSIS — E782 Mixed hyperlipidemia: Secondary | ICD-10-CM

## 2021-02-16 ENCOUNTER — Other Ambulatory Visit: Payer: Self-pay

## 2021-02-16 DIAGNOSIS — R935 Abnormal findings on diagnostic imaging of other abdominal regions, including retroperitoneum: Secondary | ICD-10-CM

## 2021-02-17 ENCOUNTER — Ambulatory Visit (HOSPITAL_COMMUNITY)
Admission: RE | Admit: 2021-02-17 | Discharge: 2021-02-17 | Disposition: A | Discharge status: Home / Self Care | Payer: Federal, State, Local not specified - PPO | Source: Ambulatory Visit | Attending: Internal Medicine | Admitting: Internal Medicine

## 2021-02-17 ENCOUNTER — Other Ambulatory Visit: Payer: Self-pay

## 2021-02-17 DIAGNOSIS — R935 Abnormal findings on diagnostic imaging of other abdominal regions, including retroperitoneum: Secondary | ICD-10-CM | POA: Insufficient documentation

## 2021-02-17 MED ORDER — BARIUM SULFATE 0.1 % PO SUSP
450.0000 mL | Freq: Once | ORAL | Status: AC
  Administered 2021-02-17: 1350 mL via ORAL

## 2021-02-17 MED ORDER — GADOBUTROL 1 MMOL/ML IV SOLN
10.0000 mL | Freq: Once | INTRAVENOUS | Status: AC | PRN
  Administered 2021-02-17: 10 mL via INTRAVENOUS

## 2021-03-27 ENCOUNTER — Other Ambulatory Visit: Payer: Self-pay | Admitting: Cardiovascular Disease

## 2021-03-28 ENCOUNTER — Other Ambulatory Visit: Payer: Self-pay | Admitting: Physician Assistant

## 2021-04-02 ENCOUNTER — Other Ambulatory Visit: Payer: Self-pay

## 2021-04-02 LAB — LIPID PANEL
Chol/HDL Ratio: 3.3 ratio (ref 0.0–4.4)
Cholesterol, Total: 177 mg/dL (ref 100–199)
HDL: 54 mg/dL (ref >39)
LDL Chol Calc (NIH): 95 mg/dL (ref 0–99)
Triglycerides: 161 mg/dL — ABNORMAL HIGH (ref 0–149)
VLDL Cholesterol Cal: 28 mg/dL (ref 5–40)

## 2021-04-02 MED ORDER — PANTOPRAZOLE SODIUM 40 MG PO TBEC: 40.0000 mg | DELAYED_RELEASE_TABLET | Freq: Every day | ORAL | 0 refills | Status: DC

## 2021-04-03 ENCOUNTER — Telehealth: Payer: Self-pay

## 2021-04-03 NOTE — Telephone Encounter (Signed)
**Note De-Identified Dael Howland Obfuscation** I started a Pantoprazole PA through covermymeds. Key: [id number]

## 2021-04-07 ENCOUNTER — Encounter: Payer: Self-pay | Admitting: Internal Medicine

## 2021-04-07 ENCOUNTER — Telehealth (INDEPENDENT_AMBULATORY_CARE_PROVIDER_SITE_OTHER): Payer: Medicare Other | Admitting: Internal Medicine

## 2021-04-07 VITALS — BP 118/82 | HR 70 | Wt 210.0 lb

## 2021-04-07 DIAGNOSIS — U071 COVID-19: Secondary | ICD-10-CM

## 2021-04-07 MED ORDER — MOLNUPIRAVIR EUA 200MG CAPSULE: 4.0000 | ORAL_CAPSULE | Freq: Two times a day (BID) | ORAL | 0 refills | Status: AC

## 2021-04-07 NOTE — Progress Notes (Signed)
Subjective:    Patient ID: [name], female    DOB: 1956-03-15, 65 y.o.   MRN: [id number]  DOS:  04/07/2021 Type of visit - description: Virtual Visit via Video Note  I connected with the above patient  by a video enabled telemedicine application and verified that I am speaking with the correct person using two identifiers.   THIS ENCOUNTER IS A VIRTUAL VISIT DUE TO COVID-19 - PATIENT WAS NOT SEEN IN THE OFFICE. PATIENT HAS CONSENTED TO VIRTUAL VISIT / TELEMEDICINE VISIT   Location of patient: home  Location of provider: office  Persons participating in the virtual visit: patient, provider   I discussed the limitations of evaluation and management by telemedicine and the availability of in person appointments. The patient expressed understanding and agreed to proceed.  Acute Symptoms a started 3 days ago: Chills, sweats, cough, fatigue, myalgias. Subsequently she developed some DOE and also chest pain. The chest pain is at the anterior mid chest, steady, does not increase with cough, sometimes increased with deep breaths. It is different to her heart chest pain but nevertheless  she is concerned.  Tested positive for COVID, test done at the onset of symptoms. Overall, the chills, fatigue and myalgias have decreased.  Denies any nausea or vomiting No sputum production Has only taking Tylenol and Coricidin     Review of Systems See above   Past Medical History:  Diagnosis Date   Angina effort 05/22/2013   Anxiety    CAD (coronary artery disease), possible SCAD 12/14/2012   Colon polyps    Duodenitis    Dyspnea on exertion    LEXISCAN, 09/30/2008 - normal, EKG negative for ischemia, no ECG changes   Esophageal stricture    Gastritis    GERD (gastroesophageal reflux disease)    Gout    "only from RX given after MI" (01/22/2013)   H/O hiatal hernia    History of esophageal stricture    Hypertension    2D ECHO, 09/30/2008 - EF >55%, normal: RENAL DOPPLER,  03/21/2007 - normal duplex study   Hypothyroidism    IBS (irritable bowel syndrome)    Ischemic colitis ([place])    Lower GI bleeding    10/07/11 "I've had 3 episodes in the past year"   Myocardial infarction ([place]) 12/10/2012   "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)   Tubular adenoma polyp of rectum 2008    Past Surgical History:  Procedure Laterality Date   ABDOMINAL HYSTERECTOMY  1999   ANTERIOR LUMBAR FUSION  2010   "L4-5" (01/22/2013)   BACK SURGERY  09/2008   FUSION/l4-5   CARDIAC CATHETERIZATION N/A 02/06/2015   Procedure: Left Heart Cath and Coronary Angiography;  Surgeon: Leonie Man, MD;  Location: [facility];  Service: Cardiovascular;  Laterality: N/A;   ESOPHAGEAL DILATION  08/2010   ESOPHAGEAL DILATION     "once or twice" (01/22/2013)   HAMMER TOE SURGERY Bilateral ~ 2002   LEFT HEART CATHETERIZATION WITH CORONARY ANGIOGRAM N/A 12/11/2012   Procedure: LEFT HEART CATHETERIZATION WITH CORONARY ANGIOGRAM;  Surgeon: Leonie Man, MD;  Location: [facility] C[facility];  Service: Cardiovascular;  Laterality: N/A;   LEFT HEART CATHETERIZATION WITH CORONARY ANGIOGRAM N/A 12/15/2012   Procedure: LEFT HEART CATHETERIZATION WITH CORONARY ANGIOGRAM;  Surgeon: Leonie Man, MD;  Location: [facility] CATH [facility];  Service: Cardiovascular;  Laterality: N/A;   PERIPHERAL VENOUS STUDY  10/03/2008   No evidence of DVT, superficial thrombosis, or Baker's cyst   TONSILLECTOMY AND ADENOIDECTOMY  [place]    Allergies as of 04/07/2021   No Known Allergies      Medication List        Accurate as of April 07, 2021 11:59 PM. If you have any questions, ask your nurse or doctor.          ALPRAZolam 0.5 MG tablet Commonly known as: XANAX TAKE 1 TABLET BY MOUTH 3 TIMES DAILY AS NEEDED FOR ANXIETY   amLODipine 5 MG tablet Commonly known as: NORVASC TAKE ONE TABLET BY MOUTH ONE TIME DAILY   aspirin 81 MG EC tablet Take 1 tablet (81 mg total) by mouth daily.    clopidogrel 75 MG tablet Commonly known as: PLAVIX Take 1 tablet by mouth once a day with breakfast   estradiol 1 MG tablet Commonly known as: ESTRACE TAKE ONE TABLET BY MOUTH ONE TIME DAILY   isosorbide mononitrate 60 MG 24 hr tablet Commonly known as: IMDUR TAKE ONE TABLET BY MOUTH ONE TIME DAILY   lisinopril 5 MG tablet Commonly known as: ZESTRIL TAKE ONE TABLET BY MOUTH ONE TIME DAILY   metoprolol tartrate 25 MG tablet Commonly known as: LOPRESSOR Take 1 tablet (25 mg total) by mouth 2 (two) times daily.   molnupiravir EUA 200 mg Caps Take 4 capsules (800 mg total) by mouth 2 (two) times daily for 5 days. Started by: Kathlene November, MD   nitroGLYCERIN 0.4 MG SL tablet Commonly known as: NITROSTAT Place 1 tablet (0.4 mg total) under the tongue every 5 (five) minutes as needed for chest pain.   pantoprazole 40 MG tablet Commonly known as: PROTONIX Take 1 tablet (40 mg total) by mouth daily.   ranolazine 1000 MG SR tablet Commonly known as: RANEXA TAKE ONE TABLET BY MOUTH TWICE DAILY   Repatha SureClick [id number] MG/ML Soaj Generic drug: Evolocumab INJECT '140MG'$  INTO THE SKIN ONCE EVERY 14 DAYS           Objective:   Physical Exam BP 118/82   Pulse 70   Wt 210 lb (95.3 kg)   BMI 37.20 kg/m  This is a virtual video visit, the patient looks well, alert oriented x3, in no distress, speaking in complete sentences, no cough noted    Assessment    65 year old female, PMH includes CAD, hypertension, DOE, GERD, obesity, prediabetes, presents with:  COVID-19: The patient is 65, has a history of CAD, she had 3 vaccinations before. Her main concern is the chest pain as described above, blood pressure is okay, she is not tachycardic, CP is different from her coronary chest pain.  I do not think pain is related to CAD, at the same time the patient understood limitations of a virtual visit. We agreed on the following: Get a chest x-ray, molnupiravir continue OTCs. Close  observation of her symptoms, if she gets more symptomatic, and the chest pain change to a different pattern or she is not gradually better she will go to the ER or seek for help. She verbalized understanding. A detailed note with instructions sent to the patient.    I discussed the assessment and treatment plan with the patient. The patient was provided an opportunity to ask questions and all were answered. The patient agreed with the plan and demonstrated an understanding of the instructions.   The patient was advised to call back or seek an in-person evaluation if the symptoms worsen or if the condition fails to improve as anticipated.

## 2021-04-08 ENCOUNTER — Ambulatory Visit: Payer: Federal, State, Local not specified - PPO | Admitting: Internal Medicine

## 2021-04-08 ENCOUNTER — Ambulatory Visit (HOSPITAL_BASED_OUTPATIENT_CLINIC_OR_DEPARTMENT_OTHER)
Admission: RE | Admit: 2021-04-08 | Discharge: 2021-04-08 | Disposition: A | Discharge status: Home / Self Care | Payer: Medicare Other | Source: Ambulatory Visit | Attending: Internal Medicine | Admitting: Internal Medicine

## 2021-04-08 ENCOUNTER — Other Ambulatory Visit: Payer: Self-pay

## 2021-04-08 DIAGNOSIS — U071 COVID-19: Secondary | ICD-10-CM | POA: Insufficient documentation

## 2021-04-14 ENCOUNTER — Other Ambulatory Visit: Payer: Self-pay

## 2021-04-14 ENCOUNTER — Encounter: Payer: Self-pay | Admitting: Internal Medicine

## 2021-04-14 ENCOUNTER — Ambulatory Visit (INDEPENDENT_AMBULATORY_CARE_PROVIDER_SITE_OTHER): Payer: Medicare Other | Admitting: Internal Medicine

## 2021-04-14 VITALS — BP 116/68 | HR 74 | Temp 97.8°F | Resp 18 | Ht 63.0 in | Wt 216.0 lb

## 2021-04-14 DIAGNOSIS — N390 Urinary tract infection, site not specified: Secondary | ICD-10-CM

## 2021-04-14 DIAGNOSIS — I2510 Atherosclerotic heart disease of native coronary artery without angina pectoris: Secondary | ICD-10-CM | POA: Diagnosis not present

## 2021-04-14 DIAGNOSIS — U071 COVID-19: Secondary | ICD-10-CM

## 2021-04-14 DIAGNOSIS — R350 Frequency of micturition: Secondary | ICD-10-CM | POA: Diagnosis not present

## 2021-04-14 LAB — POCT URINALYSIS DIP (MANUAL ENTRY)
Bilirubin, UA: NEGATIVE
Glucose, UA: NEGATIVE mg/dL
Ketones, POC UA: NEGATIVE mg/dL
Nitrite, UA: NEGATIVE
Protein Ur, POC: 30 mg/dL — AB
Spec Grav, UA: 1.015 (ref 1.010–1.025)
Urobilinogen, UA: 0.2 E.U./dL
pH, UA: 5 (ref 5.0–8.0)

## 2021-04-14 MED ORDER — SULFAMETHOXAZOLE-TRIMETHOPRIM 800-160 MG PO TABS: 1.0000 | ORAL_TABLET | Freq: Two times a day (BID) | ORAL | 0 refills | Status: DC

## 2021-04-14 NOTE — Patient Instructions (Signed)
Drink plenty of fluids  Start Bactrim (a sulfa drug) for 1 week  If you are not gradually better let us know  If you have fever, chills, severe symptoms, increasing stomach pain: Call the office

## 2021-04-14 NOTE — Progress Notes (Signed)
Subjective:    Patient ID: [name], female    DOB: 19-Mar-1956, 65 y.o.   MRN: [id number]  DOS:  04/14/2021 Type of visit - description: Acute  Symptoms a started 5 or 6 days ago: Suprapubic discomfort and dysuria, mostly at nighttime.  Has not had a UTI in a long time.  Also, was recently Dx with COVID.  Feels better.  Denies fever, chest pain, difficulty breathing or cough.  Review of Systems Denies vomiting, diarrhea or blood in the stools.  Admits to some nausea. Denies any vaginal bleeding or discharge No gross hematuria or difficulty urinating but admits to some urinary frequency.  Past Medical History:  Diagnosis Date   Angina effort 05/22/2013   Anxiety    CAD (coronary artery disease), possible SCAD 12/14/2012   Colon polyps    Duodenitis    Dyspnea on exertion    LEXISCAN, 09/30/2008 - normal, EKG negative for ischemia, no ECG changes   Esophageal stricture    Gastritis    GERD (gastroesophageal reflux disease)    Gout    "only from RX given after MI" (01/22/2013)   H/O hiatal hernia    History of esophageal stricture    Hypertension    2D ECHO, 09/30/2008 - EF >55%, normal: RENAL DOPPLER, 03/21/2007 - normal duplex study   Hypothyroidism    IBS (irritable bowel syndrome)    Ischemic colitis ([place])    Lower GI bleeding    10/07/11 "I've had 3 episodes in the past year"   Myocardial infarction ([place]) 12/10/2012   "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)   Tubular adenoma polyp of rectum 2008    Past Surgical History:  Procedure Laterality Date   ABDOMINAL HYSTERECTOMY  1999   ANTERIOR LUMBAR FUSION  2010   "L4-5" (01/22/2013)   BACK SURGERY  09/2008   FUSION/l4-5   CARDIAC CATHETERIZATION N/A 02/06/2015   Procedure: Left Heart Cath and Coronary Angiography;  Surgeon: Leonie Man, MD;  Location: [facility];  Service: Cardiovascular;  Laterality: N/A;   ESOPHAGEAL DILATION  08/2010   ESOPHAGEAL DILATION     "once or twice" (01/22/2013)    HAMMER TOE SURGERY Bilateral ~ 2002   LEFT HEART CATHETERIZATION WITH CORONARY ANGIOGRAM N/A 12/11/2012   Procedure: LEFT HEART CATHETERIZATION WITH CORONARY ANGIOGRAM;  Surgeon: Leonie Man, MD;  Location: [facility] CATH LAB;  Service: Cardiovascular;  Laterality: N/A;   LEFT HEART CATHETERIZATION WITH CORONARY ANGIOGRAM N/A 12/15/2012   Procedure: LEFT HEART CATHETERIZATION WITH CORONARY ANGIOGRAM;  Surgeon: Leonie Man, MD;  Location: [facility] CATH LAB;  Service: Cardiovascular;  Laterality: N/A;   PERIPHERAL VENOUS STUDY  10/03/2008   No evidence of DVT, superficial thrombosis, or Baker's cyst   TONSILLECTOMY AND ADENOIDECTOMY  1960's   TUBAL LIGATION  1985    Allergies as of 04/14/2021   No Known Allergies      Medication List        Accurate as of April 14, 2021 11:59 PM. If you have any questions, ask your nurse or doctor.          ALPRAZolam 0.5 MG tablet Commonly known as: XANAX TAKE 1 TABLET BY MOUTH 3 TIMES DAILY AS NEEDED FOR ANXIETY   amLODipine 5 MG tablet Commonly known as: NORVASC TAKE ONE TABLET BY MOUTH ONE TIME DAILY   aspirin 81 MG EC tablet Take 1 tablet (81 mg total) by mouth daily.   clopidogrel 75 MG tablet Commonly known as: PLAVIX Take 1  tablet by mouth once a day with breakfast   estradiol 1 MG tablet Commonly known as: ESTRACE TAKE ONE TABLET BY MOUTH ONE TIME DAILY   isosorbide mononitrate 60 MG 24 hr tablet Commonly known as: IMDUR TAKE ONE TABLET BY MOUTH ONE TIME DAILY   lisinopril 5 MG tablet Commonly known as: ZESTRIL TAKE ONE TABLET BY MOUTH ONE TIME DAILY   metoprolol tartrate 25 MG tablet Commonly known as: LOPRESSOR Take 1 tablet (25 mg total) by mouth 2 (two) times daily.   nitroGLYCERIN 0.4 MG SL tablet Commonly known as: NITROSTAT Place 1 tablet (0.4 mg total) under the tongue every 5 (five) minutes as needed for chest pain.   pantoprazole 40 MG tablet Commonly known as: PROTONIX Take 1 tablet (40 mg total) by mouth  daily.   ranolazine 1000 MG SR tablet Commonly known as: RANEXA TAKE ONE TABLET BY MOUTH TWICE DAILY   Repatha SureClick [id number] MG/ML Soaj Generic drug: Evolocumab INJECT '140MG'$  INTO THE SKIN ONCE EVERY 14 DAYS   sulfamethoxazole-trimethoprim 800-160 MG tablet Commonly known as: BACTRIM DS Take 1 tablet by mouth 2 (two) times daily. Started by: Kathlene November, MD           Objective:   Physical Exam BP 116/68 (BP Location: Left Arm, Patient Position: Sitting, Cuff Size: Normal)   Pulse 74   Temp 97.8 F (36.6 C)   Resp 18   Ht '5\' 3"'$  (1.6 m)   Wt 216 lb (98 kg)   SpO2 97%   BMI 38.26 kg/m  General:   Well developed, NAD, BMI noted.  HEENT:  Normocephalic . Face symmetric, atraumatic Lungs:  CTA B Normal respiratory effort, no intercostal retractions, no accessory muscle use. Heart: RRR,  no murmur.  Abdomen:  Not distended, slightly TTP at the suprapubic area without mass or rebound.  Mild discomfort to palpation extends to the sides, L>R. Skin: Not pale. Not jaundice Lower extremities: no pretibial edema bilaterally  Neurologic:  alert & oriented X3.  Speech normal, gait appropriate for age and unassisted Psych--  Cognition and judgment appear intact.  Cooperative with normal attention span and concentration.  Behavior appropriate. No anxious or depressed appearing.     Assessment     65 year old female, PMH includes CAD, hypertension, DOE, GERD, obesity, prediabetes, presents with: UTI: Based on symptoms and U dip patient has a UTI. We will send a UA, urine culture. Start Bactrim x1 week, drink plenty of fluids, call if not better COVID: Recently diagnosed, seems to be doing well.     This visit occurred during the SARS-CoV-2 public health emergency.  Safety protocols were in place, including screening questions prior to the visit, additional usage of staff PPE, and extensive cleaning of exam room while observing appropriate contact time as indicated for  disinfecting solutions.

## 2021-04-16 LAB — URINE CULTURE
MICRO NUMBER:: 12366928
SPECIMEN QUALITY:: ADEQUATE

## 2021-04-17 ENCOUNTER — Emergency Department (HOSPITAL_BASED_OUTPATIENT_CLINIC_OR_DEPARTMENT_OTHER): Payer: Medicare Other

## 2021-04-17 ENCOUNTER — Inpatient Hospital Stay (HOSPITAL_BASED_OUTPATIENT_CLINIC_OR_DEPARTMENT_OTHER)
Admission: EM | Admit: 2021-04-17 | Discharge: 2021-04-20 | DRG: 392 | Disposition: A | Discharge status: Home / Self Care | Payer: Medicare Other | Attending: Internal Medicine | Admitting: Internal Medicine

## 2021-04-17 ENCOUNTER — Other Ambulatory Visit: Payer: Self-pay

## 2021-04-17 ENCOUNTER — Encounter (HOSPITAL_BASED_OUTPATIENT_CLINIC_OR_DEPARTMENT_OTHER): Payer: Self-pay

## 2021-04-17 ENCOUNTER — Telehealth: Payer: Self-pay | Admitting: Gastroenterology

## 2021-04-17 DIAGNOSIS — I2510 Atherosclerotic heart disease of native coronary artery without angina pectoris: Secondary | ICD-10-CM | POA: Diagnosis not present

## 2021-04-17 DIAGNOSIS — Z79899 Other long term (current) drug therapy: Secondary | ICD-10-CM

## 2021-04-17 DIAGNOSIS — K922 Gastrointestinal hemorrhage, unspecified: Secondary | ICD-10-CM

## 2021-04-17 DIAGNOSIS — Z6837 Body mass index (BMI) 37.0-37.9, adult: Secondary | ICD-10-CM

## 2021-04-17 DIAGNOSIS — E039 Hypothyroidism, unspecified: Secondary | ICD-10-CM | POA: Diagnosis present

## 2021-04-17 DIAGNOSIS — E860 Dehydration: Secondary | ICD-10-CM | POA: Diagnosis not present

## 2021-04-17 DIAGNOSIS — I129 Hypertensive chronic kidney disease with stage 1 through stage 4 chronic kidney disease, or unspecified chronic kidney disease: Secondary | ICD-10-CM | POA: Diagnosis present

## 2021-04-17 DIAGNOSIS — B9620 Unspecified Escherichia coli [E. coli] as the cause of diseases classified elsewhere: Secondary | ICD-10-CM | POA: Diagnosis present

## 2021-04-17 DIAGNOSIS — Z90710 Acquired absence of both cervix and uterus: Secondary | ICD-10-CM | POA: Diagnosis not present

## 2021-04-17 DIAGNOSIS — Z1611 Resistance to penicillins: Secondary | ICD-10-CM | POA: Diagnosis present

## 2021-04-17 DIAGNOSIS — Z8719 Personal history of other diseases of the digestive system: Secondary | ICD-10-CM

## 2021-04-17 DIAGNOSIS — Z8616 Personal history of COVID-19: Secondary | ICD-10-CM

## 2021-04-17 DIAGNOSIS — Z7902 Long term (current) use of antithrombotics/antiplatelets: Secondary | ICD-10-CM

## 2021-04-17 DIAGNOSIS — K5900 Constipation, unspecified: Secondary | ICD-10-CM | POA: Diagnosis not present

## 2021-04-17 DIAGNOSIS — F419 Anxiety disorder, unspecified: Secondary | ICD-10-CM | POA: Diagnosis present

## 2021-04-17 DIAGNOSIS — I10 Essential (primary) hypertension: Secondary | ICD-10-CM | POA: Diagnosis present

## 2021-04-17 DIAGNOSIS — K529 Noninfective gastroenteritis and colitis, unspecified: Secondary | ICD-10-CM | POA: Diagnosis not present

## 2021-04-17 DIAGNOSIS — K219 Gastro-esophageal reflux disease without esophagitis: Secondary | ICD-10-CM | POA: Diagnosis present

## 2021-04-17 DIAGNOSIS — N3090 Cystitis, unspecified without hematuria: Secondary | ICD-10-CM | POA: Diagnosis not present

## 2021-04-17 DIAGNOSIS — Z8249 Family history of ischemic heart disease and other diseases of the circulatory system: Secondary | ICD-10-CM | POA: Diagnosis not present

## 2021-04-17 DIAGNOSIS — R6881 Early satiety: Secondary | ICD-10-CM | POA: Diagnosis present

## 2021-04-17 DIAGNOSIS — I252 Old myocardial infarction: Secondary | ICD-10-CM

## 2021-04-17 DIAGNOSIS — E871 Hypo-osmolality and hyponatremia: Secondary | ICD-10-CM | POA: Diagnosis not present

## 2021-04-17 DIAGNOSIS — K921 Melena: Secondary | ICD-10-CM | POA: Diagnosis not present

## 2021-04-17 DIAGNOSIS — E669 Obesity, unspecified: Secondary | ICD-10-CM | POA: Diagnosis present

## 2021-04-17 DIAGNOSIS — K589 Irritable bowel syndrome without diarrhea: Secondary | ICD-10-CM | POA: Diagnosis present

## 2021-04-17 DIAGNOSIS — N1831 Chronic kidney disease, stage 3a: Secondary | ICD-10-CM | POA: Diagnosis present

## 2021-04-17 DIAGNOSIS — Z86010 Personal history of colonic polyps: Secondary | ICD-10-CM

## 2021-04-17 DIAGNOSIS — Z981 Arthrodesis status: Secondary | ICD-10-CM

## 2021-04-17 DIAGNOSIS — Z841 Family history of disorders of kidney and ureter: Secondary | ICD-10-CM

## 2021-04-17 DIAGNOSIS — Z23 Encounter for immunization: Secondary | ICD-10-CM | POA: Diagnosis not present

## 2021-04-17 DIAGNOSIS — E785 Hyperlipidemia, unspecified: Secondary | ICD-10-CM | POA: Diagnosis present

## 2021-04-17 DIAGNOSIS — A09 Infectious gastroenteritis and colitis, unspecified: Principal | ICD-10-CM | POA: Diagnosis present

## 2021-04-17 DIAGNOSIS — Z7982 Long term (current) use of aspirin: Secondary | ICD-10-CM

## 2021-04-17 DIAGNOSIS — K5909 Other constipation: Secondary | ICD-10-CM | POA: Diagnosis present

## 2021-04-17 DIAGNOSIS — Z79890 Hormone replacement therapy (postmenopausal): Secondary | ICD-10-CM

## 2021-04-17 LAB — CBC WITH DIFFERENTIAL/PLATELET
Abs Immature Granulocytes: 0.09 10*3/uL — ABNORMAL HIGH (ref 0.00–0.07)
Basophils Absolute: 0 10*3/uL (ref 0.0–0.1)
Basophils Relative: 0 %
Eosinophils Absolute: 0 10*3/uL (ref 0.0–0.5)
Eosinophils Relative: 0 %
HCT: 35.7 % — ABNORMAL LOW (ref 36.0–46.0)
Hemoglobin: 12.5 g/dL (ref 12.0–15.0)
Immature Granulocytes: 1 %
Lymphocytes Relative: 12 %
Lymphs Abs: 1.5 10*3/uL (ref 0.7–4.0)
MCH: 31.3 pg (ref 26.0–34.0)
MCHC: 35 g/dL (ref 30.0–36.0)
MCV: 89.3 fL (ref 80.0–100.0)
Monocytes Absolute: 0.9 10*3/uL (ref 0.1–1.0)
Monocytes Relative: 7 %
Neutro Abs: 10.2 10*3/uL — ABNORMAL HIGH (ref 1.7–7.7)
Neutrophils Relative %: 80 %
Platelets: 321 10*3/uL (ref 150–400)
RBC: 4 MIL/uL (ref 3.87–5.11)
RDW: 12.5 % (ref 11.5–15.5)
WBC: 12.8 10*3/uL — ABNORMAL HIGH (ref 4.0–10.5)
nRBC: 0 % (ref 0.0–0.2)

## 2021-04-17 LAB — URINALYSIS, MICROSCOPIC (REFLEX)

## 2021-04-17 LAB — COMPREHENSIVE METABOLIC PANEL
ALT: 13 U/L (ref 0–44)
AST: 16 U/L (ref 15–41)
Albumin: 3.4 g/dL — ABNORMAL LOW (ref 3.5–5.0)
Alkaline Phosphatase: 87 U/L (ref 38–126)
Anion gap: 7 (ref 5–15)
BUN: 21 mg/dL (ref 8–23)
CO2: 23 mmol/L (ref 22–32)
Calcium: 9 mg/dL (ref 8.9–10.3)
Chloride: 100 mmol/L (ref 98–111)
Creatinine, Ser: 1.46 mg/dL — ABNORMAL HIGH (ref 0.44–1.00)
GFR, Estimated: 40 mL/min — ABNORMAL LOW (ref >60)
Glucose, Bld: 144 mg/dL — ABNORMAL HIGH (ref 70–99)
Potassium: 4.3 mmol/L (ref 3.5–5.1)
Sodium: 130 mmol/L — ABNORMAL LOW (ref 135–145)
Total Bilirubin: 0.2 mg/dL — ABNORMAL LOW (ref 0.3–1.2)
Total Protein: 7.4 g/dL (ref 6.5–8.1)

## 2021-04-17 LAB — URINALYSIS, ROUTINE W REFLEX MICROSCOPIC
Bilirubin Urine: NEGATIVE
Glucose, UA: NEGATIVE mg/dL
Ketones, ur: NEGATIVE mg/dL
Leukocytes,Ua: NEGATIVE
Nitrite: NEGATIVE
Protein, ur: NEGATIVE mg/dL
Specific Gravity, Urine: 1.015 (ref 1.005–1.030)
pH: 6 (ref 5.0–8.0)

## 2021-04-17 LAB — RESP PANEL BY RT-PCR (FLU A&B, COVID) ARPGX2
Influenza A by PCR: NEGATIVE
Influenza B by PCR: NEGATIVE
SARS Coronavirus 2 by RT PCR: POSITIVE — AB

## 2021-04-17 LAB — SEDIMENTATION RATE: Sed Rate: 54 mm/hr — ABNORMAL HIGH (ref 0–22)

## 2021-04-17 LAB — LACTIC ACID, PLASMA
Lactic Acid, Venous: 1.1 mmol/L (ref 0.5–1.9)
Lactic Acid, Venous: 0.8 mmol/L (ref 0.5–1.9)

## 2021-04-17 LAB — OCCULT BLOOD X 1 CARD TO LAB, STOOL: Fecal Occult Bld: POSITIVE — AB

## 2021-04-17 LAB — HEMOGLOBIN AND HEMATOCRIT, BLOOD
HCT: 35.8 % — ABNORMAL LOW (ref 36.0–46.0)
Hemoglobin: 12.3 g/dL (ref 12.0–15.0)

## 2021-04-17 LAB — C-REACTIVE PROTEIN: CRP: 4.4 mg/dL — ABNORMAL HIGH (ref <1.0)

## 2021-04-17 MED ORDER — SODIUM CHLORIDE 0.9 % IV SOLN: INTRAVENOUS | Status: AC

## 2021-04-17 MED ORDER — IOHEXOL 350 MG/ML SOLN
100.0000 mL | Freq: Once | INTRAVENOUS | Status: AC | PRN
  Administered 2021-04-17: 100 mL via INTRAVENOUS

## 2021-04-17 MED ORDER — SODIUM CHLORIDE 0.9 % IV BOLUS
500.0000 mL | Freq: Once | INTRAVENOUS | Status: AC
  Administered 2021-04-17: 500 mL via INTRAVENOUS

## 2021-04-17 MED ORDER — PANTOPRAZOLE SODIUM 40 MG PO TBEC
40.0000 mg | DELAYED_RELEASE_TABLET | Freq: Every day | ORAL | Status: DC
  Administered 2021-04-17 – 2021-04-20 (×4): 40 mg via ORAL
  Filled 2021-04-17 (×4): qty 1

## 2021-04-17 MED ORDER — MORPHINE SULFATE (PF) 4 MG/ML IV SOLN
4.0000 mg | Freq: Once | INTRAVENOUS | Status: AC
  Administered 2021-04-17: 4 mg via INTRAVENOUS
  Filled 2021-04-17: qty 1

## 2021-04-17 MED ORDER — ACETAMINOPHEN 650 MG RE SUPP: 650.0000 mg | Freq: Four times a day (QID) | RECTAL | Status: DC | PRN

## 2021-04-17 MED ORDER — METOPROLOL TARTRATE 25 MG PO TABS
25.0000 mg | ORAL_TABLET | Freq: Two times a day (BID) | ORAL | Status: DC
  Administered 2021-04-17 – 2021-04-20 (×6): 25 mg via ORAL
  Filled 2021-04-17 (×6): qty 1

## 2021-04-17 MED ORDER — ONDANSETRON HCL 4 MG/2ML IJ SOLN
4.0000 mg | Freq: Once | INTRAMUSCULAR | Status: AC
  Administered 2021-04-17: 4 mg via INTRAVENOUS
  Filled 2021-04-17: qty 2

## 2021-04-17 MED ORDER — DICYCLOMINE HCL 10 MG PO CAPS
10.0000 mg | ORAL_CAPSULE | Freq: Once | ORAL | Status: AC
  Administered 2021-04-17: 10 mg via ORAL
  Filled 2021-04-17: qty 1

## 2021-04-17 MED ORDER — CEFTRIAXONE SODIUM 1 G IJ SOLR
1.0000 g | INTRAMUSCULAR | Status: AC
  Administered 2021-04-17 – 2021-04-18 (×2): 1 g via INTRAVENOUS
  Filled 2021-04-17 (×2): qty 10
  Filled 2021-04-17 (×2): qty 1

## 2021-04-17 MED ORDER — HYDRALAZINE HCL 20 MG/ML IJ SOLN: 10.0000 mg | Freq: Four times a day (QID) | INTRAMUSCULAR | Status: AC | PRN

## 2021-04-17 MED ORDER — ACETAMINOPHEN 325 MG PO TABS
650.0000 mg | ORAL_TABLET | Freq: Four times a day (QID) | ORAL | Status: DC | PRN
  Administered 2021-04-17: 650 mg via ORAL
  Filled 2021-04-17: qty 2

## 2021-04-17 NOTE — Plan of Care (Signed)
  Problem: Health Behavior/Discharge Planning: Goal: Ability to manage health-related needs will improve Outcome: Progressing   Problem: Education: Goal: Knowledge of General Education information will improve Description: Including pain rating scale, medication(s)/side effects and non-pharmacologic comfort measures Outcome: Progressing   Problem: Clinical Measurements: Goal: Ability to maintain clinical measurements within normal limits will improve Outcome: Progressing   Problem: Clinical Measurements: Goal: Will remain free from infection Outcome: Progressing

## 2021-04-17 NOTE — ED Notes (Signed)
Report to [facility].  Report to Lattie Haw, Therapist, sports at [facility].

## 2021-04-17 NOTE — ED Triage Notes (Signed)
Pt on Plavix and Aspirin. Treated for UTI 2 days ago

## 2021-04-17 NOTE — ED Triage Notes (Signed)
Pt states multible instances of "pooping blood" since 9pm 04/16/21.

## 2021-04-17 NOTE — ED Provider Notes (Signed)
[facility]Y DEPARTMENT Provider Note   CSN: [id number] Arrival date & time: 04/17/21  0326     History Chief Complaint  Patient presents with   Rectal Bleeding    [name] is a 65 y.o. female.  The history is provided by the patient and medical records.  Rectal Bleeding [name] is a 65 y.o. female who presents to the Emergency Department complaining of hematochezia. She presents the emergency department from home for evaluation of hematochezia that started around 9 PM. She reports she is having bright red blood per rectum. Initially she had a small amount of stool mixed in. She reports tender greater bowel movements since it began. She has associated lower abdominal cramping. She did have nausea with one episode of emesis. She denies any fevers, chest pain, difficulty breathing. She does have a history of scad, ischemic colitis. She takes Plavix and aspirin. She did have a similar episode that was less severe several years ago, when she was diagnosed with ischemic colitis. No known bad food exposures. She was started on Bactrim for UTI on September 13. She did have COVID-19 starting September 3.    Past Medical History:  Diagnosis Date   Angina effort 05/22/2013   Anxiety    CAD (coronary artery disease), possible SCAD 12/14/2012   Colon polyps    Duodenitis    Dyspnea on exertion    LEXISCAN, 09/30/2008 - normal, EKG negative for ischemia, no ECG changes   Esophageal stricture    Gastritis    GERD (gastroesophageal reflux disease)    Gout    "only from RX given after MI" (01/22/2013)   H/O hiatal hernia    History of esophageal stricture    Hypertension    2D ECHO, 09/30/2008 - EF >55%, normal: RENAL DOPPLER, 03/21/2007 - normal duplex study   Hypothyroidism    IBS (irritable bowel syndrome)    Ischemic colitis ([place])    Lower GI bleeding    10/07/11 "I've had 3 episodes in the past year"   Myocardial infarction ([place]) 12/10/2012    "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)   Tubular adenoma polyp of rectum 2008    Patient Active Problem List   Diagnosis Date Noted   Lower abdominal pain 01/28/2021   Dyspnea on exertion 10/24/2019   Pre-diabetes 01/14/2017   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   Tachycardia, somewhat irregular, episodic 10/08/2013   Dizziness 10/08/2013   Angina effort 05/22/2013   Unstable angina ([place]) 01/22/2013   Hyperlipidemia 01/01/2013   CAD (coronary artery disease), possible SCAD 12/14/2012   NSTEMI (non-ST elevated myocardial infarction) ([place]) 12/10/2012   Essential hypertension 12/10/2012   Hypothyroidism 12/10/2012   Obesity (BMI 35.0-39.9 without comorbidity) 12/10/2012   Gastroenteritis 10/08/2011   Acute ischemic colitis ([place]) 08/09/2011   DYSPHAGIA UNSPECIFIED 07/01/2010   RECTAL BLEEDING Jan 2013 11/04/2009   GERD 02/11/2009   Irritable bowel syndrome 02/11/2009   Generalized abdominal pain 02/11/2009   PERSONAL HX COLONIC POLYPS 02/11/2009    Past Surgical History:  Procedure Laterality Date   ABDOMINAL HYSTERECTOMY  1999   ANTERIOR LUMBAR FUSION  2010   "L4-5" (01/22/2013)   BACK SURGERY  09/2008   FUSION/l4-5   CARDIAC CATHETERIZATION N/A 02/06/2015   Procedure: Left Heart Cath and Coronary Angiography;  Surgeon: Leonie Man, MD;  Location: [facility];  Service: Cardiovascular;  Laterality: N/A;   ESOPHAGEAL DILATION  08/2010   ESOPHAGEAL DILATION     "once or twice" (  01/22/2013)   HAMMER TOE SURGERY Bilateral ~ 2002   LEFT HEART CATHETERIZATION WITH CORONARY ANGIOGRAM N/A 12/11/2012   Procedure: LEFT HEART CATHETERIZATION WITH CORONARY ANGIOGRAM;  Surgeon: Leonie Man, MD;  Location: [facility] CATH LAB;  Service: Cardiovascular;  Laterality: N/A;   LEFT HEART CATHETERIZATION WITH CORONARY ANGIOGRAM N/A 12/15/2012   Procedure: LEFT HEART CATHETERIZATION WITH CORONARY ANGIOGRAM;  Surgeon: Leonie Man, MD;  Location: [facility] CATH LAB;  Service:  Cardiovascular;  Laterality: N/A;   PERIPHERAL VENOUS STUDY  10/03/2008   No evidence of DVT, superficial thrombosis, or Baker's cyst   TONSILLECTOMY AND ADENOIDECTOMY  1960's   TUBAL LIGATION  1985     OB History   No obstetric history on file.     Family History  Problem Relation Age of Onset   Colon cancer Brother 14   Kidney disease Mother    Stroke Father    Multiple myeloma Sister    Heart attack Brother    Other Brother    Other Brother     Social History   Tobacco Use   Smoking status: Never   Smokeless tobacco: Never  Vaping Use   Vaping Use: Never used  Substance Use Topics   Alcohol use: No   Drug use: No    Home Medications Prior to Admission medications   Medication Sig Start Date End Date Taking? Authorizing Provider  ALPRAZolam (XANAX) 0.5 MG tablet TAKE 1 TABLET BY MOUTH 3 TIMES DAILY AS NEEDED FOR ANXIETY 08/10/18   Copland, Gay Filler, MD  amLODipine (NORVASC) 5 MG tablet TAKE ONE TABLET BY MOUTH ONE TIME DAILY 10/30/20   Lorretta Harp, MD  aspirin EC 81 MG EC tablet Take 1 tablet (81 mg total) by mouth daily. 01/23/13   Lyda Jester M, [name]  clopidogrel (PLAVIX) 75 MG tablet Take 1 tablet by mouth once a day with breakfast 10/30/20   Lorretta Harp, MD  estradiol (ESTRACE) 1 MG tablet TAKE ONE TABLET BY MOUTH ONE TIME DAILY  02/01/19   Copland, Gay Filler, MD  isosorbide mononitrate (IMDUR) 60 MG 24 hr tablet TAKE ONE TABLET BY MOUTH ONE TIME DAILY 03/30/21   Lorretta Harp, MD  lisinopril (ZESTRIL) 5 MG tablet TAKE ONE TABLET BY MOUTH ONE TIME DAILY 03/30/21   Lorretta Harp, MD  metoprolol tartrate (LOPRESSOR) 25 MG tablet Take 1 tablet (25 mg total) by mouth 2 (two) times daily. 01/31/20   Almyra Deforest, PA  nitroGLYCERIN (NITROSTAT) 0.4 MG SL tablet Place 1 tablet (0.4 mg total) under the tongue every 5 (five) minutes as needed for chest pain. 01/31/20   Almyra Deforest, PA  pantoprazole (PROTONIX) 40 MG tablet Take 1 tablet (40 mg total) by mouth daily.  04/02/21   Lorretta Harp, MD  ranolazine (RANEXA) 1000 MG SR tablet TAKE ONE TABLET BY MOUTH TWICE DAILY 01/12/21   Hilty, Nadean Corwin, MD  REPATHA SURECLICK 160 MG/ML SOAJ INJECT 140MG INTO THE SKIN ONCE EVERY 14 DAYS 11/10/20   Hilty, Nadean Corwin, MD  sulfamethoxazole-trimethoprim (BACTRIM DS) 800-160 MG tablet Take 1 tablet by mouth 2 (two) times daily. 04/14/21   Colon Branch, MD    Allergies    Patient has no known allergies.  Review of Systems   Review of Systems  Gastrointestinal:  Positive for hematochezia.  All other systems reviewed and are negative.  Physical Exam Updated Vital Signs BP 131/80 (BP Location: Right Arm)   Pulse 74   Temp (!) 97.5 F (  36.4 C) (Oral)   Resp 18   Ht _0  (1.6 m)   Wt 95.3 kg   SpO2 95%   BMI 37.20 kg/m   Physical Exam Vitals and nursing note reviewed.  Constitutional:      Appearance: She is well-developed.  HENT:     Head: Normocephalic and atraumatic.  Cardiovascular:     Rate and Rhythm: Normal rate and regular rhythm.  Pulmonary:     Effort: Pulmonary effort is normal. No respiratory distress.  Abdominal:     Palpations: Abdomen is soft.     Tenderness: There is no guarding or rebound.     Comments: Mild lower abdominal tenderness  Genitourinary:    Comments: No gross blood or masses on DRE. There is a small amount of mucus present Musculoskeletal:        General: No swelling or tenderness.  Skin:    General: Skin is warm and dry.  Neurological:     Mental Status: She is alert and oriented to person, place, and time.  Psychiatric:        Behavior: Behavior normal.    ED Results / Procedures / Treatments   Labs (all labs ordered are listed, but only abnormal results are displayed) Labs Reviewed  COMPREHENSIVE METABOLIC PANEL - Abnormal; Notable for the following components:      Result Value   Sodium 130 (*)    Glucose, Bld 144 (*)    Creatinine, Ser 1.46 (*)    Albumin 3.4 (*)    Total Bilirubin 0.2 (*)    GFR,  Estimated 40 (*)    All other components within normal limits  CBC WITH DIFFERENTIAL/PLATELET - Abnormal; Notable for the following components:   WBC 12.8 (*)    HCT 35.7 (*)    Neutro Abs 10.2 (*)    Abs Immature Granulocytes 0.09 (*)    All other components within normal limits  OCCULT BLOOD X 1 CARD TO LAB, STOOL - Abnormal; Notable for the following components:   Fecal Occult Bld POSITIVE (*)    All other components within normal limits  URINALYSIS, ROUTINE W REFLEX MICROSCOPIC - Abnormal; Notable for the following components:   Hgb urine dipstick SMALL (*)    All other components within normal limits  URINALYSIS, MICROSCOPIC (REFLEX) - Abnormal; Notable for the following components:   Bacteria, UA RARE (*)    All other components within normal limits  GASTROINTESTINAL PANEL BY PCR, STOOL (REPLACES STOOL CULTURE)  C DIFFICILE QUICK SCREEN W PCR REFLEX    RESP PANEL BY RT-PCR (FLU A&B, COVID) [id number]  LACTIC ACID, PLASMA  LACTIC ACID, PLASMA  SEDIMENTATION RATE  C-REACTIVE PROTEIN    EKG EKG Interpretation  Date/Time:  Friday April 17 2021 04:01:30 EDT Ventricular Rate:  69 PR Interval:  199 QRS Duration: 105 QT Interval:  425 QTC Calculation: 456 R Axis:   -9 Text Interpretation: Sinus rhythm Confirmed by Quintella Reichert [phone]) on 04/17/2021 4:23:45 AM  Radiology CT Angio Abd/Pel W and/or Wo Contrast  Result Date: 04/17/2021 CLINICAL DATA:  Acute mesenteric ischemia.  Passing blood in stool EXAM: CTA ABDOMEN AND PELVIS WITHOUT AND WITH CONTRAST TECHNIQUE: Multidetector CT imaging of the abdomen and pelvis was performed using the standard protocol during bolus administration of intravenous contrast. Multiplanar reconstructed images and MIPs were obtained and reviewed to evaluate the vascular anatomy. CONTRAST:  126m OMNIPAQUE IOHEXOL 350 MG/ML SOLN COMPARISON:  02/17/2021 MR enterography. CTA of the abdomen 08/13/2011 FINDINGS: VASCULAR Aorta: Atheromatous plaque  intermittently seen throughout the aorta. No aneurysm or dissection. Celiac: No proximal stenosis or dissection. Diffuse atheromatous plaque along the splenic artery. SMA: Mild atheromatous plaque proximally. No flow limiting stenosis or major branch occlusion. Renals: Mild plaque and narrowing at the left renal artery ostium. No stenosis or dissection. IMA: Patent Inflow: Diffuse atheromatous plaque.  No dissection or aneurysm Proximal Outflow: Mild atherosclerosis. Veins: Systemic and portal venous vessels are diffusely patent. Review of the MIP images confirms the above findings. NON-VASCULAR Hepatobiliary: No focal liver abnormality.No evidence of biliary obstruction or stone. Pancreas: Unremarkable. Spleen: Unremarkable. Adrenals/Urinary Tract: Negative adrenals. No hydronephrosis or stone. Prominent but symmetric urothelium at the level of the upper ureters and renal pelvis and bladder. No convincing bladder wall thickening given collapse. Stomach/Bowel: Colonic thickening with submucosal low-density edematous appearance, spanning the proximal transverse colon to sigmoid. There also discontinuous areas of small bowel thickening and mesenteric edema proximally. No nonenhancing segments, perforation, or obstruction. This patchy involvement suggests an inflammatory process, although there is known history of ischemic colitis. Lymphatic:  no mass or adenopathy. Reproductive:Hysterectomy Other: No ascites or pneumoperitoneum. Musculoskeletal: Lumbar spine degeneration with L5-S1 anterior lumbar interbody fusion. IMPRESSION: 1. Extensive colitis and patchy enteritis spanning different visceral distributions and not associated with any occlusive vascular disease. 2. Urothelial prominence diffusely, please correlate with urinalysis. Electronically Signed   By: Jorje Guild M.D.   On: 04/17/2021 04:51    Procedures Procedures   Medications Ordered in ED Medications  iohexol (OMNIPAQUE) 350 MG/ML injection 100  mL (100 mLs Intravenous Contrast Given 04/17/21 0411)  sodium chloride 0.9 % bolus 500 mL (500 mLs Intravenous New Bag/Given 04/17/21 0446)  dicyclomine (BENTYL) capsule 10 mg (10 mg Oral Given 04/17/21 0507)    ED Course  I have reviewed the triage vital signs and the nursing notes.  Pertinent labs & imaging results that were available during my care of the patient were reviewed by me and considered in my medical decision making (see chart for details).    MDM Rules/Calculators/A&P                          patient with remote history of ischemic colitis as well as history of scad on Plavix and aspirin here for evaluation of abdominal pain and hematochezia. She does have mild tenderness on examination without peritoneal findings. No gross blood on rectal examination but her hemoccult is positive. EKG without arrhythmia. CBC with stable hemoglobin, does have mild leukocytosis. BMP with mild hyponatremia, mild elevation and creatinine. CTA was obtained given her history, which is negative for occlusion but does demonstrate extensive colitis. Discussed with Dr. Bryan Lemma with G.I. - recommends adding inflammatory markers in admitting to [facility] for ongoing observation and G.I. consult. Patient updated findings of studies recommended treatment plan. Patient is in agreement with plan. Hospitalist consulted for admission.  Final Clinical Impression(s) / ED Diagnoses Final diagnoses:  Lower GI bleed  Colitis    Rx / DC Orders ED Discharge Orders     None        Quintella Reichert, MD 04/17/21 [phone]

## 2021-04-17 NOTE — H&P (Signed)
History and Physical    [name] NOB:[id number] DOB: [birth year] DOA: 04/17/2021  PCP: Darreld Mclean, MD Patient coming from: Home  Chief Complaint: Bloody stool  HPI: [name] is a 65 y.o. female with medical history significant of female with a history of CAD (possible spontaneous coronary artery dissection), NSTEMI, GERD, colonic polyps, hypothyroidism, ischemic colitis. Patient presented secondary to diarrhea and cramping which transitioned to hematochezia. She continues to have abdominal pain mainly in her lower quadrants. She reports taking Bentyl with did not help. She reports about 6 episodes of bloody stool. One episode of a small amount of emesis last night but no nausea/vomiting today.  ED Course: Vitals: Temperature of 98.5 F, pulse of 72 bpm, respirations of 18/min, blood pressure of 136/73, SPO2 of 97% on room air Labs: Sodium 130, glucose of 144, creatinine 1.46, WBC of 12,800, hemoglobin of 12.5 > 12.3 Imaging: CT abdomen and pelvis significant for no evidence of ischemic colitis but there is evidence of diffuse colitis and patchy enteritis in addition to evidence of cystitis Medications/Course: Bentyl, morphine, Zofran, 500 mL normal saline bolus  Review of Systems: Review of Systems  Constitutional:  Negative for chills and fever.  Respiratory:  Negative for cough, sputum production, shortness of breath and wheezing.   Cardiovascular:  Negative for chest pain and palpitations.  Gastrointestinal:  Positive for abdominal pain, blood in stool, diarrhea, nausea and vomiting (1 episode). Negative for constipation and melena.  All other systems reviewed and are negative.  Past Medical History:  Diagnosis Date   Angina effort 05/22/2013   Anxiety    CAD (coronary artery disease), possible SCAD 12/14/2012   Colon polyps    Duodenitis    Dyspnea on exertion    LEXISCAN, 09/30/2008 - normal, EKG negative for ischemia, no ECG changes   Esophageal  stricture    Gastritis    GERD (gastroesophageal reflux disease)    Gout    "only from RX given after MI" (01/22/2013)   H/O hiatal hernia    History of esophageal stricture    Hypertension    2D ECHO, 09/30/2008 - EF >55%, normal: RENAL DOPPLER, 03/21/2007 - normal duplex study   Hypothyroidism    IBS (irritable bowel syndrome)    Ischemic colitis ([place])    Lower GI bleeding    10/07/11 "I've had 3 episodes in the past year"   Myocardial infarction ([place]) 12/10/2012   "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)   Tubular adenoma polyp of rectum 2008    Past Surgical History:  Procedure Laterality Date   ABDOMINAL HYSTERECTOMY  1999   ANTERIOR LUMBAR FUSION  2010   "L4-5" (01/22/2013)   BACK SURGERY  09/2008   FUSION/l4-5   CARDIAC CATHETERIZATION N/A 02/06/2015   Procedure: Left Heart Cath and Coronary Angiography;  Surgeon: Leonie Man, MD;  Location: [facility];  Service: Cardiovascular;  Laterality: N/A;   ESOPHAGEAL DILATION  08/2010   ESOPHAGEAL DILATION     "once or twice" (01/22/2013)   HAMMER TOE SURGERY Bilateral ~ 2002   LEFT HEART CATHETERIZATION WITH CORONARY ANGIOGRAM N/A 12/11/2012   Procedure: LEFT HEART CATHETERIZATION WITH CORONARY ANGIOGRAM;  Surgeon: Leonie Man, MD;  Location: [facility] CATH [facility];  Service: Cardiovascular;  Laterality: N/A;   LEFT HEART CATHETERIZATION WITH CORONARY ANGIOGRAM N/A 12/15/2012   Procedure: LEFT HEART CATHETERIZATION WITH CORONARY ANGIOGRAM;  Surgeon: Leonie Man, MD;  Location: [facility];  Service: Cardiovascular;  Laterality: N/A;   PERIPHERAL  VENOUS STUDY  10/03/2008   No evidence of DVT, superficial thrombosis, or Baker's cyst   TONSILLECTOMY AND ADENOIDECTOMY  1960's   TUBAL LIGATION  1985     reports that she has never smoked. She has never used smokeless tobacco. She reports that she does not drink alcohol and does not use drugs.  No Known Allergies  Family History  Problem Relation Age of Onset   Colon  cancer Brother 57   Kidney disease Mother    Stroke Father    Multiple myeloma Sister    Heart attack Brother    Other Brother    Other Brother    Prior to Admission medications   Medication Sig Start Date End Date Taking? Authorizing Provider  ALPRAZolam (XANAX) 0.5 MG tablet TAKE 1 TABLET BY MOUTH 3 TIMES DAILY AS NEEDED FOR ANXIETY 08/10/18   Copland, Gay Filler, MD  amLODipine (NORVASC) 5 MG tablet TAKE ONE TABLET BY MOUTH ONE TIME DAILY 10/30/20   Lorretta Harp, MD  aspirin EC 81 MG EC tablet Take 1 tablet (81 mg total) by mouth daily. 01/23/13   Lyda Jester M, [name]  clopidogrel (PLAVIX) 75 MG tablet Take 1 tablet by mouth once a day with breakfast 10/30/20   Lorretta Harp, MD  estradiol (ESTRACE) 1 MG tablet TAKE ONE TABLET BY MOUTH ONE TIME DAILY  02/01/19   Copland, Gay Filler, MD  isosorbide mononitrate (IMDUR) 60 MG 24 hr tablet TAKE ONE TABLET BY MOUTH ONE TIME DAILY 03/30/21   Lorretta Harp, MD  lisinopril (ZESTRIL) 5 MG tablet TAKE ONE TABLET BY MOUTH ONE TIME DAILY 03/30/21   Lorretta Harp, MD  metoprolol tartrate (LOPRESSOR) 25 MG tablet Take 1 tablet (25 mg total) by mouth 2 (two) times daily. 01/31/20   Almyra Deforest, PA  nitroGLYCERIN (NITROSTAT) 0.4 MG SL tablet Place 1 tablet (0.4 mg total) under the tongue every 5 (five) minutes as needed for chest pain. 01/31/20   Almyra Deforest, PA  pantoprazole (PROTONIX) 40 MG tablet Take 1 tablet (40 mg total) by mouth daily. 04/02/21   Lorretta Harp, MD  ranolazine (RANEXA) 1000 MG SR tablet TAKE ONE TABLET BY MOUTH TWICE DAILY 01/12/21   Hilty, Nadean Corwin, MD  REPATHA SURECLICK 921 MG/ML SOAJ INJECT 140MG INTO THE SKIN ONCE EVERY 14 DAYS 11/10/20   Hilty, Nadean Corwin, MD  sulfamethoxazole-trimethoprim (BACTRIM DS) 800-160 MG tablet Take 1 tablet by mouth 2 (two) times daily. 04/14/21   Colon Branch, MD    Physical Exam:  Physical Exam Constitutional:      General: She is not in acute distress.    Appearance: She is not diaphoretic.   HENT:     Mouth/Throat:     Mouth: Mucous membranes are dry.  Eyes:     Conjunctiva/sclera: Conjunctivae normal.     Pupils: Pupils are equal, round, and reactive to light.  Cardiovascular:     Rate and Rhythm: Normal rate and regular rhythm.     Heart sounds: Normal heart sounds. No murmur heard. Pulmonary:     Effort: Pulmonary effort is normal. No respiratory distress.     Breath sounds: Normal breath sounds. No wheezing or rales.  Abdominal:     General: Bowel sounds are normal. There is no distension.     Palpations: Abdomen is soft.     Tenderness: There is abdominal tenderness in the suprapubic area. There is no guarding or rebound.  Musculoskeletal:  General: No tenderness. Normal range of motion.     Cervical back: Normal range of motion.  Lymphadenopathy:     Cervical: No cervical adenopathy.  Skin:    General: Skin is warm and dry.  Neurological:     Mental Status: She is alert and oriented to person, place, and time.     Labs on Admission: I have personally reviewed following labs and imaging studies  CBC: Recent Labs  Lab 04/17/21 0356  WBC 12.8*  NEUTROABS 10.2*  HGB 12.5  HCT 35.7*  MCV 89.3  PLT 867    Basic Metabolic Panel: Recent Labs  Lab 04/17/21 0356  NA 130*  K 4.3  CL 100  CO2 23  GLUCOSE 144*  BUN 21  CREATININE 1.46*  CALCIUM 9.0    GFR: Estimated Creatinine Clearance: 42.2 mL/min (A) (by C-G formula based on SCr of 1.46 mg/dL (H)).  Liver Function Tests: Recent Labs  Lab 04/17/21 0356  AST 16  ALT 13  ALKPHOS 87  BILITOT 0.2*  PROT 7.4  ALBUMIN 3.4*    Urine analysis:    Component Value Date/Time   COLORURINE YELLOW 04/17/2021 0356   APPEARANCEUR CLEAR 04/17/2021 0356   LABSPEC 1.015 04/17/2021 0356   PHURINE 6.0 04/17/2021 0356   GLUCOSEU NEGATIVE 04/17/2021 0356   HGBUR SMALL (A) 04/17/2021 0356   BILIRUBINUR NEGATIVE 04/17/2021 0356   BILIRUBINUR negative 04/14/2021 1527   KETONESUR NEGATIVE  04/17/2021 0356   PROTEINUR NEGATIVE 04/17/2021 0356   UROBILINOGEN 0.2 04/14/2021 1527   UROBILINOGEN 0.2 12/11/2012 0809   NITRITE NEGATIVE 04/17/2021 0356   LEUKOCYTESUR NEGATIVE 04/17/2021 0356     Radiological Exams on Admission: CT Angio Abd/Pel W and/or Wo Contrast  Result Date: 04/17/2021 CLINICAL DATA:  Acute mesenteric ischemia.  Passing blood in stool EXAM: CTA ABDOMEN AND PELVIS WITHOUT AND WITH CONTRAST TECHNIQUE: Multidetector CT imaging of the abdomen and pelvis was performed using the standard protocol during bolus administration of intravenous contrast. Multiplanar reconstructed images and MIPs were obtained and reviewed to evaluate the vascular anatomy. CONTRAST:  122m OMNIPAQUE IOHEXOL 350 MG/ML SOLN COMPARISON:  02/17/2021 MR enterography. CTA of the abdomen 08/13/2011 FINDINGS: VASCULAR Aorta: Atheromatous plaque intermittently seen throughout the aorta. No aneurysm or dissection. Celiac: No proximal stenosis or dissection. Diffuse atheromatous plaque along the splenic artery. SMA: Mild atheromatous plaque proximally. No flow limiting stenosis or major branch occlusion. Renals: Mild plaque and narrowing at the left renal artery ostium. No stenosis or dissection. IMA: Patent Inflow: Diffuse atheromatous plaque.  No dissection or aneurysm Proximal Outflow: Mild atherosclerosis. Veins: Systemic and portal venous vessels are diffusely patent. Review of the MIP images confirms the above findings. NON-VASCULAR Hepatobiliary: No focal liver abnormality.No evidence of biliary obstruction or stone. Pancreas: Unremarkable. Spleen: Unremarkable. Adrenals/Urinary Tract: Negative adrenals. No hydronephrosis or stone. Prominent but symmetric urothelium at the level of the upper ureters and renal pelvis and bladder. No convincing bladder wall thickening given collapse. Stomach/Bowel: Colonic thickening with submucosal low-density edematous appearance, spanning the proximal transverse colon to  sigmoid. There also discontinuous areas of small bowel thickening and mesenteric edema proximally. No nonenhancing segments, perforation, or obstruction. This patchy involvement suggests an inflammatory process, although there is known history of ischemic colitis. Lymphatic:  no mass or adenopathy. Reproductive:Hysterectomy Other: No ascites or pneumoperitoneum. Musculoskeletal: Lumbar spine degeneration with L5-S1 anterior lumbar interbody fusion. IMPRESSION: 1. Extensive colitis and patchy enteritis spanning different visceral distributions and not associated with any occlusive vascular disease. 2. Urothelial prominence diffusely,  please correlate with urinalysis. Electronically Signed   By: Jorje Guild M.D.   On: 04/17/2021 04:51    EKG: Independently reviewed. Normal sinus rhythm  Assessment/Plan Principal Problem:   Acute GI bleeding Active Problems:   Essential hypertension   Hypothyroidism   CAD (coronary artery disease), possible SCAD   Hyperlipidemia   Colitis   Acute GI bleed Patient with a history of acute ischemic colitis. Current presentation complicated by use of aspirin and Plavix. No associated anemia. Colonoscopy and upper endoscopy from 2019 significant for 2 colonic polyps and normal upper endoscopy. CTA abdomen/pelvis this admission without evidence of ischemic colitis but patient does have evidence of extensive colitis/patchy enteritis. Possibly infectious. GI consulted. Hemoglobin stable. -Follow-up GI pathogen panel/C. Difficile -CBC in AM -GI recommendations pending -Clear liquid diet today, NPO after midnight -If develops anemia, likely transfuse for hemoglobin less than 8 in setting of heart disease  COVID-19 infection Patient states she was first diagnosed on 9/1 but does not have documentation. She suffered from significant symptoms and was treated with molnupiravir. She was not hospitalized. Symptoms have resolved.  UTI Outpatient urine culture significant  for E. Coli resistant to ampicillin. Patient started on Bactrim as an outpatient starting 9/13 with a course for 7 days. Urinalysis on this admission does not suggest infection. Cystitis correlated on CT abdomen/pelvis. -Will switch to Ceftriaxone IV daily; will likely only need at most a 5 day course.  CKD stage IIIa Elevated creatinine from prior baseline of 1.29. -IV fluids overnight  Hyponatremia Mild. Asymptomatic.  Leukocytosis Possibly related to colitis.   Primary hypertension Med-rec not completed but patient is listed to be on lisinopril, metoprolol, Imdur and amlodipine. Blood pressure is currently controlled. -Hold antihypertensives pending medication reconciliation -Hydralazine IV prn x1 day pending above reconciliation -Restart home metoprolol 25 mg BID (Confirmed with patient. She takes once per day, but it is prescribed twice per day by cardiology per last note in March 2022).   CAD History of unstable angina History of NSTEMI (possible spontaneous coronary artery dissection but not confirmed) Extensive cardiac history with multiple heart catheterization and decisino on medical management. Patient with chronically occluded LAD, moderate diagonal branch, obtuse marginal branch and distal dominant RCA disease. Currently on metoprolol, Imdur, lisinopril, aspirin and Plavix as an outpatient. -Hold aspirin and Plavix secondary to GI bleeding for now pending GI recommendations -Resume home antihypertensives pending medication reconciliation and stability of blood pressure in setting of GI bleeding  Hypothyroidism -Continue Synthroid pending reconciliation  GERD -Protonix  Obesity Body mass index is 37.2 kg/m.   DVT prophylaxis: SCDs Code Status: Full code Family Communication: None at bedside Disposition Plan: Discharge likely home in 1-2 days pending GI recommendations, stability of hemoglobin, resolution of GI bleeding Consults called: [place]  [facility] Admission status: Observation   Cordelia Poche, MD [facility] 04/17/2021, 5:27 PM

## 2021-04-17 NOTE — Telephone Encounter (Signed)
Received page to on-call. Started having lower abdominal pain and 4-5 episodes of hematochezia this evening. Has had a similar episode in the past and dx of ischemic colitis on chart review and per patient report. Reviewed most recent CT abd/pelvis and subsequent MRE. Based on increasing frequency of hematochezia and increasing abdominal pain, recommended going to ER for expedited eval, labs, possible repeat imaging, and possible admission as appropriate. She is planning to go to [facility]. All questions answered and appreciative of call back.

## 2021-04-17 NOTE — ED Notes (Signed)
ED Provider at bedside. Dr. Ayesha Rumpf

## 2021-04-17 NOTE — ED Notes (Signed)
Patient transported to CT 

## 2021-04-18 DIAGNOSIS — E871 Hypo-osmolality and hyponatremia: Secondary | ICD-10-CM | POA: Diagnosis present

## 2021-04-18 DIAGNOSIS — K529 Noninfective gastroenteritis and colitis, unspecified: Secondary | ICD-10-CM | POA: Diagnosis not present

## 2021-04-18 DIAGNOSIS — I2510 Atherosclerotic heart disease of native coronary artery without angina pectoris: Secondary | ICD-10-CM | POA: Diagnosis present

## 2021-04-18 DIAGNOSIS — E039 Hypothyroidism, unspecified: Secondary | ICD-10-CM | POA: Diagnosis present

## 2021-04-18 DIAGNOSIS — K921 Melena: Secondary | ICD-10-CM | POA: Diagnosis present

## 2021-04-18 DIAGNOSIS — Z8719 Personal history of other diseases of the digestive system: Secondary | ICD-10-CM | POA: Diagnosis not present

## 2021-04-18 DIAGNOSIS — K5900 Constipation, unspecified: Secondary | ICD-10-CM | POA: Diagnosis present

## 2021-04-18 DIAGNOSIS — Z981 Arthrodesis status: Secondary | ICD-10-CM | POA: Diagnosis not present

## 2021-04-18 DIAGNOSIS — K559 Vascular disorder of intestine, unspecified: Secondary | ICD-10-CM

## 2021-04-18 DIAGNOSIS — K625 Hemorrhage of anus and rectum: Secondary | ICD-10-CM

## 2021-04-18 DIAGNOSIS — E860 Dehydration: Secondary | ICD-10-CM | POA: Diagnosis present

## 2021-04-18 DIAGNOSIS — I252 Old myocardial infarction: Secondary | ICD-10-CM | POA: Diagnosis not present

## 2021-04-18 DIAGNOSIS — K922 Gastrointestinal hemorrhage, unspecified: Secondary | ICD-10-CM | POA: Diagnosis not present

## 2021-04-18 DIAGNOSIS — Z1611 Resistance to penicillins: Secondary | ICD-10-CM | POA: Diagnosis present

## 2021-04-18 DIAGNOSIS — A09 Infectious gastroenteritis and colitis, unspecified: Secondary | ICD-10-CM | POA: Diagnosis present

## 2021-04-18 DIAGNOSIS — Z8249 Family history of ischemic heart disease and other diseases of the circulatory system: Secondary | ICD-10-CM | POA: Diagnosis not present

## 2021-04-18 DIAGNOSIS — Z6837 Body mass index (BMI) 37.0-37.9, adult: Secondary | ICD-10-CM | POA: Diagnosis not present

## 2021-04-18 DIAGNOSIS — Z90710 Acquired absence of both cervix and uterus: Secondary | ICD-10-CM | POA: Diagnosis not present

## 2021-04-18 DIAGNOSIS — E669 Obesity, unspecified: Secondary | ICD-10-CM | POA: Diagnosis present

## 2021-04-18 DIAGNOSIS — K219 Gastro-esophageal reflux disease without esophagitis: Secondary | ICD-10-CM | POA: Diagnosis present

## 2021-04-18 DIAGNOSIS — E785 Hyperlipidemia, unspecified: Secondary | ICD-10-CM | POA: Diagnosis present

## 2021-04-18 DIAGNOSIS — Z86010 Personal history of colonic polyps: Secondary | ICD-10-CM | POA: Diagnosis not present

## 2021-04-18 DIAGNOSIS — Z8616 Personal history of COVID-19: Secondary | ICD-10-CM | POA: Diagnosis not present

## 2021-04-18 DIAGNOSIS — Z7902 Long term (current) use of antithrombotics/antiplatelets: Secondary | ICD-10-CM

## 2021-04-18 DIAGNOSIS — Z841 Family history of disorders of kidney and ureter: Secondary | ICD-10-CM | POA: Diagnosis not present

## 2021-04-18 DIAGNOSIS — N1831 Chronic kidney disease, stage 3a: Secondary | ICD-10-CM | POA: Diagnosis present

## 2021-04-18 DIAGNOSIS — N3090 Cystitis, unspecified without hematuria: Secondary | ICD-10-CM | POA: Diagnosis present

## 2021-04-18 DIAGNOSIS — I129 Hypertensive chronic kidney disease with stage 1 through stage 4 chronic kidney disease, or unspecified chronic kidney disease: Secondary | ICD-10-CM | POA: Diagnosis present

## 2021-04-18 DIAGNOSIS — Z23 Encounter for immunization: Secondary | ICD-10-CM | POA: Diagnosis not present

## 2021-04-18 DIAGNOSIS — B9620 Unspecified Escherichia coli [E. coli] as the cause of diseases classified elsewhere: Secondary | ICD-10-CM | POA: Diagnosis present

## 2021-04-18 LAB — BASIC METABOLIC PANEL
Anion gap: 9 (ref 5–15)
BUN: 11 mg/dL (ref 8–23)
CO2: 26 mmol/L (ref 22–32)
Calcium: 9.3 mg/dL (ref 8.9–10.3)
Chloride: 106 mmol/L (ref 98–111)
Creatinine, Ser: 0.94 mg/dL (ref 0.44–1.00)
GFR, Estimated: >60 mL/min (ref >60)
Glucose, Bld: 100 mg/dL — ABNORMAL HIGH (ref 70–99)
Potassium: 4.5 mmol/L (ref 3.5–5.1)
Sodium: 141 mmol/L (ref 135–145)

## 2021-04-18 LAB — HIV ANTIBODY (ROUTINE TESTING W REFLEX): HIV Screen 4th Generation wRfx: NONREACTIVE

## 2021-04-18 LAB — CBC
HCT: 36 % (ref 36.0–46.0)
Hemoglobin: 12.1 g/dL (ref 12.0–15.0)
MCH: 30.9 pg (ref 26.0–34.0)
MCHC: 33.6 g/dL (ref 30.0–36.0)
MCV: 91.8 fL (ref 80.0–100.0)
Platelets: 308 10*3/uL (ref 150–400)
RBC: 3.92 MIL/uL (ref 3.87–5.11)
RDW: 12.9 % (ref 11.5–15.5)
WBC: 12.2 10*3/uL — ABNORMAL HIGH (ref 4.0–10.5)
nRBC: 0 % (ref 0.0–0.2)

## 2021-04-18 MED ORDER — DICYCLOMINE HCL 10 MG PO CAPS
10.0000 mg | ORAL_CAPSULE | Freq: Four times a day (QID) | ORAL | Status: DC | PRN
  Administered 2021-04-18 – 2021-04-20 (×4): 10 mg via ORAL
  Filled 2021-04-18 (×4): qty 1

## 2021-04-18 NOTE — Consult Note (Signed)
Gastroenterology Inpatient Consultation   Attending [place] [place] [place] [facility] Day: 2  Reason for Consult BRBPR, abdominal pain, CT imaging concerning for colitis versus ischemic colitis    History of Present Illness  [name] is a 65 y.o. female with a pmh significant for CAD versus spontaneous coronary artery dissection, hypertension, hyperlipidemia, GERD, hiatal hernia, esophageal stricture (status post previous dilation), colon polyps, prior ischemic colitis.  The GI service is consulted for evaluation and management of abdominal pain, abnormal imaging concerning for recurrent ischemic colitis.  The patient has been through a lot this month.  She was recently diagnosed with COVID and then had a UTI and now has presented with bright red blood per rectum and abdominal cramping/pain.  She has a previous history of ischemic colitis.  She has been followed by Dr. Henrene Pastor and PA Zehr in the past.  She describes issues of chronic constipation for years.  She only uses fiber supplementation, Citrucel, to help with this as she states she has not been told she can use anything otherwise.  Patient states that this week her bowels were more constipated.  On Wednesday she ate out with friends and within 3 hours of eating she had abdominal cramping and subsequent had bleeding.  She called the [place] [facility] on-call provider who directed her to come to the emergency department where she was at [facility] and to transfer late yesterday afternoon to [facility].  She states she has had at least 20 small bloody bowel movements but without overt stool just blood.  There is mucus admixed.  Patient was initiated on antibiotics after CT imaging suggested colitis and inflammation of the transverse colon to the descending colon.  Patient last took Plavix on Thursday as well as aspirin.  The patient states she is still dealing with cramping and discomfort.  She has used Bentyl  in the past that has been helpful with abdominal cramping and she would like that if possible.  She is not taking any more significant nonsteroidals or BC/Goody powders.  She had not really been exerting herself more so than normal other than as noted above in regards to the recent COVID infection and UTI.  She was given Bactrim for the UTI.  She has been unable to give a true stool sample.  Her last endoscopic evaluation is noted below in 2019.  GI Review of Systems Positive as above including depressed appetite, early satiety Negative for pyrosis, dysphagia, odynophagia, melena  Review of Systems  General: She has had some weight loss as a result of the COVID infection and UTI and being in the hospital; denies current fever/chills HEENT: Denies oral lesions Cardiovascular: Denies chest pain Pulmonary: Denies shortness of breath Gastroenterological: See HPI Genitourinary: Denies darkened urine or hematuria Hematological: Positive for history of easy bruising/bleeding due to Plavix Dermatological: Denies jaundice Psychological: Mood is anxious to get better   Histories  Past Medical History Past Medical History:  Diagnosis Date   Angina effort 05/22/2013   Anxiety    CAD (coronary artery disease), possible SCAD 12/14/2012   Colon polyps    Duodenitis    Dyspnea on exertion    LEXISCAN, 09/30/2008 - normal, EKG negative for ischemia, no ECG changes   Esophageal stricture    Gastritis    GERD (gastroesophageal reflux disease)    Gout    "only from RX given after MI" (01/22/2013)   H/O hiatal hernia    History of esophageal  stricture    Hypertension    2D ECHO, 09/30/2008 - EF >55%, normal: RENAL DOPPLER, 03/21/2007 - normal duplex study   Hypothyroidism    IBS (irritable bowel syndrome)    Ischemic colitis ([place])    Lower GI bleeding    10/07/11 "I've had 3 episodes in the past year"   Myocardial infarction [facility]) 12/10/2012   "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)    Tubular adenoma polyp of rectum 2008   Past Surgical History:  Procedure Laterality Date   ABDOMINAL HYSTERECTOMY  1999   ANTERIOR LUMBAR FUSION  2010   "L4-5" (01/22/2013)   BACK SURGERY  09/2008   FUSION/l4-5   CARDIAC CATHETERIZATION N/A 02/06/2015   Procedure: Left Heart Cath and Coronary Angiography;  Surgeon: Leonie Man, MD;  Location: [place] [facility];  Service: Cardiovascular;  Laterality: N/A;   ESOPHAGEAL DILATION  08/2010   ESOPHAGEAL DILATION     "once or twice" (01/22/2013)   HAMMER TOE SURGERY Bilateral ~ 2002   LEFT HEART CATHETERIZATION WITH CORONARY ANGIOGRAM N/A 12/11/2012   Procedure: LEFT HEART CATHETERIZATION WITH CORONARY ANGIOGRAM;  Surgeon: Leonie Man, MD;  Location: [facility] CATH LAB;  Service: Cardiovascular;  Laterality: N/A;   LEFT HEART CATHETERIZATION WITH CORONARY ANGIOGRAM N/A 12/15/2012   Procedure: LEFT HEART CATHETERIZATION WITH CORONARY ANGIOGRAM;  Surgeon: Leonie Man, MD;  Location: [facility] CATH LAB;  Service: Cardiovascular;  Laterality: N/A;   PERIPHERAL VENOUS STUDY  10/03/2008   No evidence of DVT, superficial thrombosis, or Baker's cyst   TONSILLECTOMY AND ADENOIDECTOMY  1960's   TUBAL LIGATION  1985    Allergies No Known Allergies  Family History Family History  Problem Relation Age of Onset   Colon cancer Brother 12   Kidney disease Mother    Stroke Father    Multiple myeloma Sister    Heart attack Brother    Other Brother    Other Brother    The patient's FH is negative for IBD/IBS/Liver Disease/GI Malignancies.  Social History Social History   Socioeconomic History   Marital status: Single    Spouse name: Not on file   Number of children: 2   Years of education: Not on file   Highest education level: Not on file  Occupational History   Occupation: Drug enforcement    Comment: Surveyor, minerals  Tobacco Use   Smoking status: Never   Smokeless tobacco: Never  Vaping Use   Vaping Use: Never used  Substance and Sexual  Activity   Alcohol use: No   Drug use: No   Sexual activity: Not Currently  Other Topics Concern   Not on file  Social History Narrative   Daily caffeine use.   Social Determinants of Health   Financial Resource Strain: Not on file  Food Insecurity: Not on file  Transportation Needs: Not on file  Physical Activity: Not on file  Stress: Not on file  Social Connections: Not on file  Intimate Partner Violence: Not on file   The patient denies Drug, Alcohol, or Tobacco use.   Occupation is Travel   Medications  Home Medications No current facility-administered medications on file prior to encounter.   Current Outpatient Medications on File Prior to Encounter  Medication Sig Dispense Refill   ALPRAZolam (XANAX) 0.5 MG tablet TAKE 1 TABLET BY MOUTH 3 TIMES DAILY AS NEEDED FOR ANXIETY (Patient taking differently: Take 0.5 mg by mouth in the morning.) 90 tablet 2   amLODipine (NORVASC) 5 MG tablet TAKE ONE  TABLET BY MOUTH ONE TIME DAILY 90 tablet 3   aspirin EC 81 MG EC tablet Take 1 tablet (81 mg total) by mouth daily.     clopidogrel (PLAVIX) 75 MG tablet Take 1 tablet by mouth once a day with breakfast 90 tablet 3   estradiol (ESTRACE) 0.5 MG tablet Take 0.5 mg by mouth daily.     isosorbide mononitrate (IMDUR) 60 MG 24 hr tablet TAKE ONE TABLET BY MOUTH ONE TIME DAILY 90 tablet 2   lisinopril (ZESTRIL) 5 MG tablet TAKE ONE TABLET BY MOUTH ONE TIME DAILY 90 tablet 2   metoprolol tartrate (LOPRESSOR) 25 MG tablet Take 1 tablet (25 mg total) by mouth 2 (two) times daily. (Patient taking differently: Take 25 mg by mouth daily.) 180 tablet 3   nitroGLYCERIN (NITROSTAT) 0.4 MG SL tablet Place 1 tablet (0.4 mg total) under the tongue every 5 (five) minutes as needed for chest pain. 25 tablet 3   pantoprazole (PROTONIX) 40 MG tablet Take 1 tablet (40 mg total) by mouth daily. 90 tablet 0   ranolazine (RANEXA) 1000 MG SR tablet TAKE ONE TABLET BY MOUTH TWICE DAILY (Patient taking  differently: Take 1,000 mg by mouth daily.) 180 tablet 0   REPATHA SURECLICK 518 MG/ML SOAJ INJECT $RemoveBef'140MG'[id number]$  INTO THE SKIN ONCE EVERY 14 DAYS 2 mL 11   sulfamethoxazole-trimethoprim (BACTRIM DS) 800-160 MG tablet Take 1 tablet by mouth 2 (two) times daily. 14 tablet 0   estradiol (ESTRACE) 1 MG tablet TAKE ONE TABLET BY MOUTH ONE TIME DAILY  (Patient not taking: Reported on 04/17/2021) 30 tablet 5   Scheduled Inpatient Medications  metoprolol tartrate  25 mg Oral BID   pantoprazole  40 mg Oral Daily   Continuous Inpatient Infusions  cefTRIAXone (ROCEPHIN)  IV Stopped (04/17/21 2138)   PRN Inpatient Medications acetaminophen **OR** acetaminophen, hydrALAZINE   Physical Examination  BP (!) 145/78 (BP Location: Right Arm)   Pulse 66   Temp 98.8 F (37.1 C) (Oral)   Resp 19   Ht $R'5\' 3"'In$  (1.6 m)   Wt 95.3 kg   SpO2 96%   BMI 37.20 kg/m  GEN: NAD, appears stated age, doesn't appear chronically ill PSYCH: Cooperative, without pressured speech EYE: Conjunctivae pink, sclerae anicteric ENT: MMM, without oral ulcers, no erythema or exudates noted NECK: Supple CV: RR without R/Gs  RESP: CTAB posteriorly, without wheezing GI: NABS, soft, NT/ND, without rebound or guarding, no HSM appreciated GU: DRE shows MSK/EXT: _ edema, no palmar erythema SKIN: No jaundice, no spider angiomata, no concerning rashes NEURO:  Alert & Oriented x 3, no focal deficits, no evidence of asterixis   Review of Data  I reviewed the following data at the time of this encounter:  Laboratory Studies   Recent Labs  Lab 04/18/21 0328  NA 141  K 4.5  CL 106  CO2 26  BUN 11  CREATININE 0.94  GLUCOSE 100*  CALCIUM 9.3   Recent Labs  Lab 04/17/21 0356  AST 16  ALT 13  ALKPHOS 87    Recent Labs  Lab 04/17/21 0356 04/17/21 1709 04/18/21 0328  WBC 12.8*  --  12.2*  HGB 12.5   < > 12.1  HCT 35.7*   < > 36.0  PLT 321  --  308   < > = values in this interval not displayed.   No results for input(s):  APTT, INR in the last 168 hours.   Imaging Studies  CT abdomen pelvis angiography IMPRESSION: 1. Extensive  colitis and patchy enteritis spanning different visceral distributions and not associated with any occlusive vascular disease. 2. Urothelial prominence diffusely, please correlate with urinalysis.  July 2022 MR enterography IMPRESSION: 1. The previously visualized focal area of circumferential small bowel wall thickening seen on prior study is not evident on today's examination and may have represented an area of focal inflammation or peristalsis. 2.  No acute abdominopelvic abnormality visualized.  GI Procedures and Studies  August 2019 EGD - Benign-appearing esophageal stenosis. Dilated. - Normal stomach. - Normal examined duodenum.  August 2019 colonoscopy - Two 1 to 3 mm polyps in the rectum and in the cecum, removed with a cold snare. Resected and retrieved. - The examination was otherwise normal on direct and retroflexion views.   Assessment  Ms. Zarling is a 65 y.o. female with a pmh significant for CAD versus spontaneous coronary artery dissection, hypertension, hyperlipidemia, GERD, hiatal hernia, esophageal stricture (status post previous dilation), colon polyps, prior ischemic colitis.  The GI service is consulted for evaluation and management of abdominal pain, abnormal imaging concerning for recurrent ischemic colitis.  The patient is hemodynamically stable.  Hemoglobin has been stable during her nearly 36 hours of hospitalization.  She is having persistent abdominal cramping.  I think the patient's presentation is most likely ischemic colitis related.  This is probably been exacerbated due to some relative dehydration as result of her COVID infection and UTI that she was dealing with earlier this month.  Interestingly she had been on Plavix so 1 would think that Plavix should help decrease the risk of patients having ischemic colitis.  Agree with holding Plavix  for now.  Can consider restarting aspirin at this time and monitoring patient's overall status.  Agree with continued antibiotics to decrease bacterial translocation with plan for at least 5 to 7 days of antibiotics (will allow medical service to decide how they want approach that).  If the patient is able to give a true stool sample, I would send it for GI pathogen panel and C. difficile to rule that out but again I suspect this is mucus and sloughing of the GI tract in the setting of a watershed area of ischemic colitis.  Time will tell.  If the patient is not improving over the course the next 2 to 3 days she may require an inpatient evaluation to rule out other sources of bleeding such as [place] IBD (I feel this is much less likely).  All patient questions were answered to the best of my ability, and the patient agrees to the aforementioned plan of action with follow-up as indicated.   Plan/Recommendations  Continue antibiotics for 5 to 7-day course for bacterial translocation minimization IV fluids for supportive measures to maintain urine output Placing patient on full liquid diet see how she does Please try to obtain the previously ordered stool studies and C. difficile (if able) Agree with holding Plavix until bleeding stops Okay with patient being on aspirin if medical service feels this is necessary from her underlying CAD and medical needs otherwise can hold as well Bentyl 10 mg 4 times daily as needed to be added Okay for Tylenol if pain and/or infrequent opioids if necessary (as per medical service) No plan for endoscopic evaluation currently but if patient does not have clinical improvement in next 2 to 3 days may consider inpatient evaluation Otherwise consideration of outpatient colonoscopy in 6 weeks could be had   Thank you for this consult.  We will continue to follow.  The  inpatient [facility] service is being taken care of by Dr. Lorenso Courier the rest of the weekend.  Please page/call  with questions or concerns.   Justice Britain, MD [facility] Office # [phone]

## 2021-04-18 NOTE — Progress Notes (Signed)
PROGRESS NOTE    [name]  [id number] DOB: 1956-06-03 DOA: 04/17/2021 PCP: Darreld Mclean, MD   Brief Narrative:  [name] is a 65 y.o. female with medical history significant of female with a history of CAD (possible spontaneous coronary artery dissection), NSTEMI, GERD, colonic polyps, hypothyroidism, ischemic colitis. Patient presented secondary to diarrhea and cramping which transitioned to hematochezia with profound right lower quadrant cramping pain.  GI consulted for suspected acute GI bleed.  Assessment & Plan:  Symptomatic acute GI bleed -Known history of ischemic colitis  -GI consulted, appreciate insight recommendations  -Imaging notable for inflammation concerning for ischemic colitis versus infectious colitis  COVID-19 infection, previously diagnosed and resolved, POA Old infection - no longer active/infectious or requiring treatment  Questionable UTI, subacute, POA -Outpatient urine culture significant for E. coli resistant to ampicillin previously on Bactrim for 7-day course, transition to IV ceftriaxone for 3 days here to cover    CKD stage IIIa, stable at baseline Elevated creatinine from prior baseline of 1.29. -IV fluids overnight   Hyponatremia Mild. Asymptomatic.   Leukocytosis Possibly related to colitis.    Primary hypertension -Continue metoprolol, hold other home medications given blood pressure currently well controlled  CAD History of unstable angina History of NSTEMI (possible spontaneous coronary artery dissection but not confirmed) -Hold aspirin and Plavix secondary to GI bleeding for now pending GI recommendations   Hypothyroidism -Continue Synthroid pending reconciliation   GERD -Protonix   Obesity Body mass index is 37.2 kg/m.     DVT prophylaxis: SCDs Code Status: Full code Family Communication: None at bedside Status is: Observation  Dispo: The patient is from: Home              Anticipated d/c  is to: Home              Anticipated d/c date is: 24 to 48 hours              Patient currently not medically stable for discharge  Consultants:  GI  Procedures:  Pending above  Antimicrobials:  Ceftriaxone x3 days  Subjective: No acute issues or events overnight, major complaint this morning is hunger and abdominal pain but otherwise denies any ongoing bowel movements, otherwise denies nausea vomiting diarrhea constipation headache fevers chills chest pain or shortness of breath  Objective: Vitals:   04/17/21 1649 04/17/21 2219 04/18/21 0213 04/18/21 0613  BP: 136/73 116/76 135/72 (!) 145/78  Pulse: 72 71 61 66  Resp: '18 20 18 19  '$ Temp: 98.5 F (36.9 C) 98.5 F (36.9 C) 98.5 F (36.9 C) 98.8 F (37.1 C)  TempSrc: Oral Oral Oral Oral  SpO2: 97% 95% 95% 96%  Weight:      Height:        Intake/Output Summary (Last 24 hours) at 04/18/2021 0741 Last data filed at 04/18/2021 0337 Gross per 24 hour  Intake 742.65 ml  Output --  Net 742.65 ml   Filed Weights   04/17/21 0334  Weight: 95.3 kg    Examination:  General exam: Appears calm and comfortable  Respiratory system: Clear to auscultation. Respiratory effort normal. Cardiovascular system: S1 & S2 heard, RRR. No JVD, murmurs, rubs, gallops or clicks. No pedal edema. Gastrointestinal system: Abdomen is nondistended, soft and nontender. No organomegaly or masses felt. Normal bowel sounds heard. Central nervous system: Alert and oriented. No focal neurological deficits. Extremities: Symmetric 5 x 5 power. Skin: No rashes, lesions or ulcers Psychiatry: Judgement and insight appear normal. Mood &  affect appropriate.     Data Reviewed: I have personally reviewed following labs and imaging studies  CBC: Recent Labs  Lab 04/17/21 0356 04/17/21 1709 04/18/21 0328  WBC 12.8*  --  12.2*  NEUTROABS 10.2*  --   --   HGB 12.5 12.3 12.1  HCT 35.7* 35.8* 36.0  MCV 89.3  --  91.8  PLT 321  --  [id number]   Basic Metabolic  Panel: Recent Labs  Lab 04/17/21 0356 04/18/21 0328  NA 130* 141  K 4.3 4.5  CL 100 106  CO2 23 26  GLUCOSE 144* 100*  BUN 21 11  CREATININE 1.46* 0.94  CALCIUM 9.0 9.3   GFR: Estimated Creatinine Clearance: 65.6 mL/min (by C-G formula based on SCr of 0.94 mg/dL). Liver Function Tests: Recent Labs  Lab 04/17/21 0356  AST 16  ALT 13  ALKPHOS 87  BILITOT 0.2*  PROT 7.4  ALBUMIN 3.4*   No results for input(s): LIPASE, AMYLASE in the last 168 hours. No results for input(s): AMMONIA in the last 168 hours. Coagulation Profile: No results for input(s): INR, PROTIME in the last 168 hours. Cardiac Enzymes: No results for input(s): CKTOTAL, CKMB, CKMBINDEX, TROPONINI in the last 168 hours. BNP (last 3 results) No results for input(s): PROBNP in the last 8760 hours. HbA1C: No results for input(s): HGBA1C in the last 72 hours. CBG: No results for input(s): GLUCAP in the last 168 hours. Lipid Profile: No results for input(s): CHOL, HDL, LDLCALC, TRIG, CHOLHDL, LDLDIRECT in the last 72 hours. Thyroid Function Tests: No results for input(s): TSH, T4TOTAL, FREET4, T3FREE, THYROIDAB in the last 72 hours. Anemia Panel: No results for input(s): VITAMINB12, FOLATE, FERRITIN, TIBC, IRON, RETICCTPCT in the last 72 hours. Sepsis Labs: Recent Labs  Lab 04/17/21 0356 04/17/21 1709  LATICACIDVEN 1.1 0.8    Recent Results (from the past 240 hour(s))  Urine Culture     Status: Abnormal   Collection Time: 04/14/21  4:34 PM   Specimen: Urine  Result Value Ref Range Status   MICRO NUMBER: [id number]  Final   SPECIMEN QUALITY: Adequate  Final   Sample Source NOT GIVEN  Final   STATUS: FINAL  Final   ISOLATE 1: Escherichia coli (A)  Final    Comment: Greater than 100,000 CFU/mL of Escherichia coli      Susceptibility   Escherichia coli - URINE CULTURE, REFLEX    AMOX/CLAVULANIC 8 Sensitive     AMPICILLIN >=32 Resistant     AMPICILLIN/SULBACTAM 16 Intermediate     CEFAZOLIN* <=4 Not  Reportable      * For infections other than uncomplicated UTI caused by E. coli, K. pneumoniae or P. mirabilis: Cefazolin is resistant if MIC > or = 8 mcg/mL. (Distinguishing susceptible versus intermediate for isolates with MIC < or = 4 mcg/mL requires additional testing.) For uncomplicated UTI caused by E. coli, K. pneumoniae or P. mirabilis: Cefazolin is susceptible if MIC <32 mcg/mL and predicts susceptible to the oral agents cefaclor, cefdinir, cefpodoxime, cefprozil, cefuroxime, cephalexin and loracarbef.     CEFTAZIDIME <=1 Sensitive     CEFEPIME <=1 Sensitive     CEFTRIAXONE <=1 Sensitive     CIPROFLOXACIN <=0.25 Sensitive     LEVOFLOXACIN <=0.12 Sensitive     GENTAMICIN <=1 Sensitive     IMIPENEM <=0.25 Sensitive     NITROFURANTOIN <=16 Sensitive     PIP/TAZO <=4 Sensitive     TOBRAMYCIN <=1 Sensitive     TRIMETH/SULFA* <=20 Sensitive      *  For infections other than uncomplicated UTI caused by E. coli, K. pneumoniae or P. mirabilis: Cefazolin is resistant if MIC > or = 8 mcg/mL. (Distinguishing susceptible versus intermediate for isolates with MIC < or = 4 mcg/mL requires additional testing.) For uncomplicated UTI caused by E. coli, K. pneumoniae or P. mirabilis: Cefazolin is susceptible if MIC <32 mcg/mL and predicts susceptible to the oral agents cefaclor, cefdinir, cefpodoxime, cefprozil, cefuroxime, cephalexin and loracarbef. Legend: S = Susceptible  I = Intermediate R = Resistant  NS = Not susceptible * = Not tested  NR = Not reported **NN = See antimicrobic comments   Resp Panel by RT-PCR (Flu A&B, Covid) Nasopharyngeal Swab     Status: Abnormal   Collection Time: 04/17/21  5:34 AM   Specimen: Nasopharyngeal Swab; Nasopharyngeal(NP) swabs in vial transport medium  Result Value Ref Range Status   SARS Coronavirus 2 by RT PCR POSITIVE (A) NEGATIVE Final    Comment: RESULT CALLED TO, READ BACK BY AND VERIFIED WITH: Jorge Ny, RN ON 04/17/21 AT  0641 BY JAG (NOTE) SARS-CoV-2 target nucleic acids are DETECTED.  The SARS-CoV-2 RNA is generally detectable in upper respiratory specimens during the acute phase of infection. Positive results are indicative of the presence of the identified virus, but do not rule out bacterial infection or co-infection with other pathogens not detected by the test. Clinical correlation with patient history and other diagnostic information is necessary to determine patient infection status. The expected result is Negative.  Fact Sheet for Patients: EntrepreneurPulse.com.au  Fact Sheet for Healthcare Providers: IncredibleEmployment.be  This test is not yet approved or cleared by the [place] FDA and  has been authorized for detection and/or diagnosis of SARS-CoV-2 by FDA under an Emergency Use Authorization (EUA).  This EUA will remain in effect (meaning  this test can be used) for the duration of  the COVID-19 declaration under Section 564(b)(1) of the Act, 21 U.S.C. section 360bbb-3(b)(1), unless the authorization is terminated or revoked sooner.     Influenza A by PCR NEGATIVE NEGATIVE Final   Influenza B by PCR NEGATIVE NEGATIVE Final    Comment: (NOTE) The Xpert Xpress SARS-CoV-2/FLU/RSV plus assay is intended as an aid in the diagnosis of influenza from Nasopharyngeal swab specimens and should not be used as a sole basis for treatment. Nasal washings and aspirates are unacceptable for Xpert Xpress SARS-CoV-2/FLU/RSV testing.  Fact Sheet for Patients: EntrepreneurPulse.com.au  Fact Sheet for Healthcare Providers: IncredibleEmployment.be  This test is not yet approved or cleared by the [place] FDA and has been authorized for detection and/or diagnosis of SARS-CoV-2 by FDA under an Emergency Use Authorization (EUA). This EUA will remain in effect (meaning this test can be used) for the duration of  the COVID-19 declaration under Section 564(b)(1) of the Act, 21 U.S.C. section 360bbb-3(b)(1), unless the authorization is terminated or revoked.  Performed at [facility], [address] [place], [place] [address]          Radiology Studies: CT Angio Abd/Pel W and/or Wo Contrast  Result Date: 04/17/2021 CLINICAL DATA:  Acute mesenteric ischemia.  Passing blood in stool EXAM: CTA ABDOMEN AND PELVIS WITHOUT AND WITH CONTRAST TECHNIQUE: Multidetector CT imaging of the abdomen and pelvis was performed using the standard protocol during bolus administration of intravenous contrast. Multiplanar reconstructed images and MIPs were obtained and reviewed to evaluate the vascular anatomy. CONTRAST:  177m OMNIPAQUE IOHEXOL 350 MG/ML SOLN COMPARISON:  02/17/2021 MR enterography. CTA of the abdomen 08/13/2011  FINDINGS: VASCULAR Aorta: Atheromatous plaque intermittently seen throughout the aorta. No aneurysm or dissection. Celiac: No proximal stenosis or dissection. Diffuse atheromatous plaque along the splenic artery. SMA: Mild atheromatous plaque proximally. No flow limiting stenosis or major branch occlusion. Renals: Mild plaque and narrowing at the left renal artery ostium. No stenosis or dissection. IMA: Patent Inflow: Diffuse atheromatous plaque.  No dissection or aneurysm Proximal Outflow: Mild atherosclerosis. Veins: Systemic and portal venous vessels are diffusely patent. Review of the MIP images confirms the above findings. NON-VASCULAR Hepatobiliary: No focal liver abnormality.No evidence of biliary obstruction or stone. Pancreas: Unremarkable. Spleen: Unremarkable. Adrenals/Urinary Tract: Negative adrenals. No hydronephrosis or stone. Prominent but symmetric urothelium at the level of the upper ureters and renal pelvis and bladder. No convincing bladder wall thickening given collapse. Stomach/Bowel: Colonic thickening with submucosal low-density edematous appearance, spanning the  proximal transverse colon to sigmoid. There also discontinuous areas of small bowel thickening and mesenteric edema proximally. No nonenhancing segments, perforation, or obstruction. This patchy involvement suggests an inflammatory process, although there is known history of ischemic colitis. Lymphatic:  no mass or adenopathy. Reproductive:Hysterectomy Other: No ascites or pneumoperitoneum. Musculoskeletal: Lumbar spine degeneration with L5-S1 anterior lumbar interbody fusion. IMPRESSION: 1. Extensive colitis and patchy enteritis spanning different visceral distributions and not associated with any occlusive vascular disease. 2. Urothelial prominence diffusely, please correlate with urinalysis. Electronically Signed   By: Jorje Guild M.D.   On: 04/17/2021 04:51     Scheduled Meds:  metoprolol tartrate  25 mg Oral BID   pantoprazole  40 mg Oral Daily   Continuous Infusions:  cefTRIAXone (ROCEPHIN)  IV Stopped (04/17/21 2138)     LOS: 0 days   Time spent: 19mn  Amed Datta C Axiel Fjeld, DO [facility]  If 7PM-7AM, please contact night-coverage [email or web address]  04/18/2021, 7:41 AM

## 2021-04-19 LAB — BASIC METABOLIC PANEL
Anion gap: 8 (ref 5–15)
BUN: 9 mg/dL (ref 8–23)
CO2: 25 mmol/L (ref 22–32)
Calcium: 9.2 mg/dL (ref 8.9–10.3)
Chloride: 109 mmol/L (ref 98–111)
Creatinine, Ser: 0.99 mg/dL (ref 0.44–1.00)
GFR, Estimated: >60 mL/min (ref >60)
Glucose, Bld: 85 mg/dL (ref 70–99)
Potassium: 4.2 mmol/L (ref 3.5–5.1)
Sodium: 142 mmol/L (ref 135–145)

## 2021-04-19 LAB — CBC
HCT: 34.6 % — ABNORMAL LOW (ref 36.0–46.0)
Hemoglobin: 11.6 g/dL — ABNORMAL LOW (ref 12.0–15.0)
MCH: 31 pg (ref 26.0–34.0)
MCHC: 33.5 g/dL (ref 30.0–36.0)
MCV: 92.5 fL (ref 80.0–100.0)
Platelets: 295 10*3/uL (ref 150–400)
RBC: 3.74 MIL/uL — ABNORMAL LOW (ref 3.87–5.11)
RDW: 13 % (ref 11.5–15.5)
WBC: 10 10*3/uL (ref 4.0–10.5)
nRBC: 0 % (ref 0.0–0.2)

## 2021-04-19 MED ORDER — ALPRAZOLAM 0.5 MG PO TABS
0.5000 mg | ORAL_TABLET | Freq: Every day | ORAL | Status: DC
  Administered 2021-04-19 – 2021-04-20 (×2): 0.5 mg via ORAL
  Filled 2021-04-19 (×2): qty 1

## 2021-04-19 MED ORDER — ESTRADIOL 1 MG PO TABS
0.5000 mg | ORAL_TABLET | Freq: Every day | ORAL | Status: DC
  Administered 2021-04-19 – 2021-04-20 (×2): 0.5 mg via ORAL
  Filled 2021-04-19 (×4): qty 0.5

## 2021-04-19 MED ORDER — AMOXICILLIN-POT CLAVULANATE 875-125 MG PO TABS
1.0000 | ORAL_TABLET | Freq: Two times a day (BID) | ORAL | Status: DC
  Administered 2021-04-19 – 2021-04-20 (×3): 1 via ORAL
  Filled 2021-04-19 (×3): qty 1

## 2021-04-19 NOTE — Consult Note (Addendum)
Progress Note Hospital Day: 3  Chief Complaint:    Hematochezia     ASSESSMENT AND PLAN   Brief History : 65 yo female with PMH significant for CAD versus spontaneous coronary artery dissection, hypertension, hyperlipidemia, CKD 3, GERD, hiatal hernia/hiatal stricture status post previous dilation, colon polyps, chronic constipation prior ischemic colitis, COVID19.   # Hematochezia with cramps on Plavix.  CT scan shows extensive colitis and patchy enteritis spanning different visceral distribution without associated occlusive vascular disease. WBC down to 10. Presentation felt to present ischemic colitis but infectious cause was also consideration --. I do not see where any stools studies have been ordered but that may be because she is no longer having diarrhea.  --Diarrhea resolved. No hematochezia since yesterday morning.  --Per our note yesterday we thought it was reasonable to continue antibiotics for 5-7 days to minimize chances of bacterial translocation but only on Rocephin for UTI at this point Could add flagyl for anaerobic coverage.  --Afebrile, normal WBC. Hgb 11.6 ( overall stable) --Advance from full liquids to soft diet. Would stay on soft ( low fiber diet) for next 7 days then advance to high fiber.  --Continue Bentyl as needed  # ? UTI, on Rocephin  # Hx of adenomatous colon polyps. Up to date on surveillance colonoscopy, last one Aug 2019      PREVIOUS GI EVALUATIONS:    CT abdomen pelvis angiography IMPRESSION: 1. Extensive colitis and patchy enteritis spanning different visceral distributions and not associated with any occlusive vascular disease. 2. Urothelial prominence diffusely, please correlate with urinalysis.   July 2022 MR enterography IMPRESSION: 1. The previously visualized focal area of circumferential small bowel wall thickening seen on prior study is not evident on today's examination and may have represented an area of focal inflammation or  peristalsis. 2.  No acute abdominopelvic abnormality visualized   SUBJECTIVE   No diarrhea or rectal bleeding but had a "rough night with sweats".      OBJECTIVE      Scheduled inpatient medications:   metoprolol tartrate  25 mg Oral BID   pantoprazole  40 mg Oral Daily   Continuous inpatient infusions:  PRN inpatient medications: acetaminophen **OR** acetaminophen, dicyclomine  Vital signs in last 24 hours: Temp:  [98.6 F (37 C)-98.8 F (37.1 C)] 98.6 F (37 C) (09/18 0515) Pulse Rate:  [61-66] 61 (09/18 0515) Resp:  [16-21] 16 (09/18 0700) BP: (120-128)/(67-76) 128/75 (09/18 0515) SpO2:  [96 %-99 %] 96 % (09/18 0515) Last BM Date: 04/18/21  Intake/Output Summary (Last 24 hours) at 04/19/2021 0903 Last data filed at 04/19/2021 0516 Gross per 24 hour  Intake 600 ml  Output --  Net 600 ml     Physical Exam:  General: Alert female in NAD Heart:  Regular rate and rhythm. No lower extremity edema Pulmonary: Normal respiratory effort Abdomen: Soft, nondistended, moderate RLQ tendernes. Normal bowel sounds.  Neurologic: Alert and oriented Psych: Pleasant. Cooperative.   Filed Weights   04/17/21 0334  Weight: 95.3 kg    Intake/Output from previous day: 09/17 0701 - 09/18 0700 In: 600 [P.O.:600] Out: -  Intake/Output this shift: No intake/output data recorded.    Lab Results: Recent Labs    04/17/21 0356 04/17/21 1709 04/18/21 0328 04/19/21 0328  WBC 12.8*  --  12.2* 10.0  HGB 12.5 12.3 12.1 11.6*  HCT 35.7* 35.8* 36.0 34.6*  PLT 321  --  308 295   BMET Recent Labs    04/17/21  [id number] 04/18/21 0328 04/19/21 0328  NA 130* 141 142  K 4.3 4.5 4.2  CL 100 106 109  CO2 '23 26 25  '$ GLUCOSE 144* 100* 85  BUN '21 11 9  '$ CREATININE 1.46* 0.94 0.99  CALCIUM 9.0 9.3 9.2   LFT Recent Labs    04/17/21 0356  PROT 7.4  ALBUMIN 3.4*  AST 16  ALT 13  ALKPHOS 87  BILITOT 0.2*   PT/INR No results for input(s): LABPROT, INR in the last 72  hours. Hepatitis Panel No results for input(s): HEPBSAG, HCVAB, HEPAIGM, HEPBIGM in the last 72 hours.  No results found.      Principal Problem:   Acute GI bleeding Active Problems:   Essential hypertension   Hypothyroidism   CAD (coronary artery disease), possible SCAD   Hyperlipidemia   Colitis     LOS: 1 day   Tye Savoy ,NP 04/19/2021, 9:03 AM

## 2021-04-19 NOTE — Progress Notes (Signed)
PROGRESS NOTE    [name]  [id number] DOB: [birth year] DOA: 04/17/2021 PCP: Darreld Mclean, MD   Brief Narrative:  [name] is a 65 y.o. female with medical history significant of female with a history of CAD (possible spontaneous coronary artery dissection), NSTEMI, GERD, colonic polyps, hypothyroidism, ischemic colitis. Patient presented secondary to diarrhea and cramping which transitioned to hematochezia with profound right lower quadrant cramping pain.  GI consulted for suspected acute GI bleed.  Assessment & Plan:  Symptomatic acute GI bleed with questionable infectious colitis, POA -GI consulted, appreciate insight recommendations  -Hemoglobin stable, no further episodes of bright red blood per rectum or melena at this point -unlikely to require endoscopy unless clinically worsens or has moderate changes and labs or symptoms -Transition to Augmentin for anaerobic coverage for 5 to 7 days pending clinical course, improving already on ceftriaxone alone, abdominal pain may be related to UTI as above given its distant location from presumed colitis on imaging  COVID-19 infection, previously diagnosed and resolved, POA Old infection - no longer active/infectious or requiring treatment  Questionable UTI, subacute, POA -Patient does not meet sepsis criteria  - Outpatient urine culture significant for E. coli resistant to ampicillin previously on Bactrim for 7-day course, transition to IV ceftriaxone for 3 days here to cover    CKD stage IIIa, stable at baseline - Elevated creatinine from prior baseline of 1.29. - IV fluids overnight   Hyponatremia - Mild. Asymptomatic.   Primary hypertension -Continue metoprolol, hold other home medications given blood pressure currently well controlled  CAD History of unstable angina History of NSTEMI (possible spontaneous coronary artery dissection but not confirmed) -Hold aspirin and Plavix secondary to GI bleeding  for now pending GI recommendations   Hypothyroidism -Continue Synthroid pending reconciliation   GERD -Protonix   Obesity Body mass index is 37.2 kg/m.     DVT prophylaxis: SCDs Code Status: Full code Family Communication: None at bedside Status is: Observation  Dispo: The patient is from: Home              Anticipated d/c is to: Home              Anticipated d/c date is: 24hours              Patient currently not medically stable for discharge  Consultants:  GI  Procedures:  Pending above  Antimicrobials:  Ceftriaxone x3 days Augmentin x5 to 7 days  Subjective: No acute issues or events overnight, denies any further episodes of GI bleed, nausea vomiting diarrhea constipation headache fevers chills or chest pain  Objective: Vitals:   04/18/21 0800 04/18/21 1253 04/18/21 2018 04/19/21 0515  BP:  128/67 120/76 128/75  Pulse:  62 66 61  Resp: 18 19 (!) 21 18  Temp:  98.8 F (37.1 C) 98.7 F (37.1 C) 98.6 F (37 C)  TempSrc:  Oral Oral Oral  SpO2:  97% 99% 96%  Weight:      Height:        Intake/Output Summary (Last 24 hours) at 04/19/2021 0740 Last data filed at 04/19/2021 0516 Gross per 24 hour  Intake 600 ml  Output --  Net 600 ml    Filed Weights   04/17/21 0334  Weight: 95.3 kg    Examination:  General exam: Appears calm and comfortable  Respiratory system: Clear to auscultation. Respiratory effort normal. Cardiovascular system: S1 & S2 heard, RRR. No JVD, murmurs, rubs, gallops or clicks. No pedal edema. Gastrointestinal  system: Abdomen is nondistended, soft and nontender. No organomegaly or masses felt. Normal bowel sounds heard. Central nervous system: Alert and oriented. No focal neurological deficits. Extremities: Symmetric 5 x 5 power. Skin: No rashes, lesions or ulcers Psychiatry: Judgement and insight appear normal. Mood & affect appropriate.     Data Reviewed: I have personally reviewed following labs and imaging  studies  CBC: Recent Labs  Lab 04/17/21 0356 04/17/21 1709 04/18/21 0328 04/19/21 0328  WBC 12.8*  --  12.2* 10.0  NEUTROABS 10.2*  --   --   --   HGB 12.5 12.3 12.1 11.6*  HCT 35.7* 35.8* 36.0 34.6*  MCV 89.3  --  91.8 92.5  PLT 321  --  308 [id number]    Basic Metabolic Panel: Recent Labs  Lab 04/17/21 0356 04/18/21 0328 04/19/21 0328  NA 130* 141 142  K 4.3 4.5 4.2  CL 100 106 109  CO2 '23 26 25  '$ GLUCOSE 144* 100* 85  BUN '21 11 9  '$ CREATININE 1.46* 0.94 0.99  CALCIUM 9.0 9.3 9.2    GFR: Estimated Creatinine Clearance: 62.2 mL/min (by C-G formula based on SCr of 0.99 mg/dL). Liver Function Tests: Recent Labs  Lab 04/17/21 0356  AST 16  ALT 13  ALKPHOS 87  BILITOT 0.2*  PROT 7.4  ALBUMIN 3.4*    No results for input(s): LIPASE, AMYLASE in the last 168 hours. No results for input(s): AMMONIA in the last 168 hours. Coagulation Profile: No results for input(s): INR, PROTIME in the last 168 hours. Cardiac Enzymes: No results for input(s): CKTOTAL, CKMB, CKMBINDEX, TROPONINI in the last 168 hours. BNP (last 3 results) No results for input(s): PROBNP in the last 8760 hours. HbA1C: No results for input(s): HGBA1C in the last 72 hours. CBG: No results for input(s): GLUCAP in the last 168 hours. Lipid Profile: No results for input(s): CHOL, HDL, LDLCALC, TRIG, CHOLHDL, LDLDIRECT in the last 72 hours. Thyroid Function Tests: No results for input(s): TSH, T4TOTAL, FREET4, T3FREE, THYROIDAB in the last 72 hours. Anemia Panel: No results for input(s): VITAMINB12, FOLATE, FERRITIN, TIBC, IRON, RETICCTPCT in the last 72 hours. Sepsis Labs: Recent Labs  Lab 04/17/21 0356 04/17/21 1709  LATICACIDVEN 1.1 0.8     Recent Results (from the past 240 hour(s))  Urine Culture     Status: Abnormal   Collection Time: 04/14/21  4:34 PM   Specimen: Urine  Result Value Ref Range Status   MICRO NUMBER: [id number]  Final   SPECIMEN QUALITY: Adequate  Final   Sample Source NOT  GIVEN  Final   STATUS: FINAL  Final   ISOLATE 1: Escherichia coli (A)  Final    Comment: Greater than 100,000 CFU/mL of Escherichia coli      Susceptibility   Escherichia coli - URINE CULTURE, REFLEX    AMOX/CLAVULANIC 8 Sensitive     AMPICILLIN >=32 Resistant     AMPICILLIN/SULBACTAM 16 Intermediate     CEFAZOLIN* <=4 Not Reportable      * For infections other than uncomplicated UTI caused by E. coli, K. pneumoniae or P. mirabilis: Cefazolin is resistant if MIC > or = 8 mcg/mL. (Distinguishing susceptible versus intermediate for isolates with MIC < or = 4 mcg/mL requires additional testing.) For uncomplicated UTI caused by E. coli, K. pneumoniae or P. mirabilis: Cefazolin is susceptible if MIC <32 mcg/mL and predicts susceptible to the oral agents cefaclor, cefdinir, cefpodoxime, cefprozil, cefuroxime, cephalexin and loracarbef.     CEFTAZIDIME <=1 Sensitive  CEFEPIME <=1 Sensitive     CEFTRIAXONE <=1 Sensitive     CIPROFLOXACIN <=0.25 Sensitive     LEVOFLOXACIN <=0.12 Sensitive     GENTAMICIN <=1 Sensitive     IMIPENEM <=0.25 Sensitive     NITROFURANTOIN <=16 Sensitive     PIP/TAZO <=4 Sensitive     TOBRAMYCIN <=1 Sensitive     TRIMETH/SULFA* <=20 Sensitive      * For infections other than uncomplicated UTI caused by E. coli, K. pneumoniae or P. mirabilis: Cefazolin is resistant if MIC > or = 8 mcg/mL. (Distinguishing susceptible versus intermediate for isolates with MIC < or = 4 mcg/mL requires additional testing.) For uncomplicated UTI caused by E. coli, K. pneumoniae or P. mirabilis: Cefazolin is susceptible if MIC <32 mcg/mL and predicts susceptible to the oral agents cefaclor, cefdinir, cefpodoxime, cefprozil, cefuroxime, cephalexin and loracarbef. Legend: S = Susceptible  I = Intermediate R = Resistant  NS = Not susceptible * = Not tested  NR = Not reported **NN = See antimicrobic comments   Resp Panel by RT-PCR (Flu A&B, Covid) Nasopharyngeal Swab      Status: Abnormal   Collection Time: 04/17/21  5:34 AM   Specimen: Nasopharyngeal Swab; Nasopharyngeal(NP) swabs in vial transport medium  Result Value Ref Range Status   SARS Coronavirus 2 by RT PCR POSITIVE (A) NEGATIVE Final    Comment: RESULT CALLED TO, READ BACK BY AND VERIFIED WITH: Jorge Ny, RN ON 04/17/21 AT 0641 BY JAG (NOTE) SARS-CoV-2 target nucleic acids are DETECTED.  The SARS-CoV-2 RNA is generally detectable in upper respiratory specimens during the acute phase of infection. Positive results are indicative of the presence of the identified virus, but do not rule out bacterial infection or co-infection with other pathogens not detected by the test. Clinical correlation with patient history and other diagnostic information is necessary to determine patient infection status. The expected result is Negative.  Fact Sheet for Patients: EntrepreneurPulse.com.au  Fact Sheet for Healthcare Providers: IncredibleEmployment.be  This test is not yet approved or cleared by the [place] FDA and  has been authorized for detection and/or diagnosis of SARS-CoV-2 by FDA under an Emergency Use Authorization (EUA).  This EUA will remain in effect (meaning  this test can be used) for the duration of  the COVID-19 declaration under Section 564(b)(1) of the Act, 21 U.S.C. section 360bbb-3(b)(1), unless the authorization is terminated or revoked sooner.     Influenza A by PCR NEGATIVE NEGATIVE Final   Influenza B by PCR NEGATIVE NEGATIVE Final    Comment: (NOTE) The Xpert Xpress SARS-CoV-2/FLU/RSV plus assay is intended as an aid in the diagnosis of influenza from Nasopharyngeal swab specimens and should not be used as a sole basis for treatment. Nasal washings and aspirates are unacceptable for Xpert Xpress SARS-CoV-2/FLU/RSV testing.  Fact Sheet for Patients: EntrepreneurPulse.com.au  Fact Sheet for Healthcare  Providers: IncredibleEmployment.be  This test is not yet approved or cleared by the [place] FDA and has been authorized for detection and/or diagnosis of SARS-CoV-2 by FDA under an Emergency Use Authorization (EUA). This EUA will remain in effect (meaning this test can be used) for the duration of the COVID-19 declaration under Section 564(b)(1) of the Act, 21 U.S.C. section 360bbb-3(b)(1), unless the authorization is terminated or revoked.  Performed at [facility], [address], [place], [place] [address]      Radiology Studies: No results found.  Scheduled Meds:  metoprolol tartrate  25 mg Oral BID  pantoprazole  40 mg Oral Daily   Continuous Infusions:   LOS: 1 day   Time spent: 68mn  Kenon Delashmit C Novali Vollman, DO [facility]  If 7PM-7AM, please contact night-coverage [email or web address]  04/19/2021, 7:40 AM

## 2021-04-19 NOTE — Plan of Care (Signed)
  Problem: Education: Goal: Knowledge of General Education information will improve Description Including pain rating scale, medication(s)/side effects and non-pharmacologic comfort measures Outcome: Progressing   Problem: Health Behavior/Discharge Planning: Goal: Ability to manage health-related needs will improve Outcome: Progressing   

## 2021-04-20 ENCOUNTER — Telehealth: Payer: Self-pay

## 2021-04-20 LAB — BASIC METABOLIC PANEL
Anion gap: 7 (ref 5–15)
BUN: 14 mg/dL (ref 8–23)
CO2: 28 mmol/L (ref 22–32)
Calcium: 9.3 mg/dL (ref 8.9–10.3)
Chloride: 107 mmol/L (ref 98–111)
Creatinine, Ser: 1.14 mg/dL — ABNORMAL HIGH (ref 0.44–1.00)
GFR, Estimated: 53 mL/min — ABNORMAL LOW (ref >60)
Glucose, Bld: 101 mg/dL — ABNORMAL HIGH (ref 70–99)
Potassium: 4 mmol/L (ref 3.5–5.1)
Sodium: 142 mmol/L (ref 135–145)

## 2021-04-20 LAB — CBC
HCT: 35.4 % — ABNORMAL LOW (ref 36.0–46.0)
Hemoglobin: 11.8 g/dL — ABNORMAL LOW (ref 12.0–15.0)
MCH: 30.4 pg (ref 26.0–34.0)
MCHC: 33.3 g/dL (ref 30.0–36.0)
MCV: 91.2 fL (ref 80.0–100.0)
Platelets: 298 10*3/uL (ref 150–400)
RBC: 3.88 MIL/uL (ref 3.87–5.11)
RDW: 12.8 % (ref 11.5–15.5)
WBC: 9.4 10*3/uL (ref 4.0–10.5)
nRBC: 0 % (ref 0.0–0.2)

## 2021-04-20 MED ORDER — INFLUENZA VAC A&B SA ADJ QUAD 0.5 ML IM PRSY
0.5000 mL | PREFILLED_SYRINGE | Freq: Once | INTRAMUSCULAR | Status: AC
  Administered 2021-04-20: 0.5 mL via INTRAMUSCULAR
  Filled 2021-04-20: qty 0.5

## 2021-04-20 MED ORDER — DICYCLOMINE HCL 10 MG PO CAPS: 20.0000 mg | ORAL_CAPSULE | Freq: Three times a day (TID) | ORAL | 0 refills | Status: DC

## 2021-04-20 MED ORDER — INFLUENZA VAC A&B SA ADJ QUAD 0.5 ML IM PRSY: 0.5000 mL | PREFILLED_SYRINGE | INTRAMUSCULAR | Status: DC

## 2021-04-20 MED ORDER — AMOXICILLIN-POT CLAVULANATE 875-125 MG PO TABS: 1.0000 | ORAL_TABLET | Freq: Two times a day (BID) | ORAL | 0 refills | Status: AC

## 2021-04-20 MED ORDER — DICYCLOMINE HCL 10 MG PO CAPS
20.0000 mg | ORAL_CAPSULE | Freq: Three times a day (TID) | ORAL | Status: DC
  Administered 2021-04-20: 20 mg via ORAL
  Filled 2021-04-20: qty 2

## 2021-04-20 NOTE — Progress Notes (Addendum)
Progress Note   Subjective  Hospital day #4 Chief Complaint: Hematochezia, abdominal pain  Today, the patient tells me that she is feeling some better.  She has not had any further bloody bowel movements.  She did have some diarrhea overnight.  Does complain of some right sided cramping pain and asks if there is anything to take other than Bentyl.  Does describe that she only had 2 of these pills yesterday when she asked about them.  She did try to eat a soft diet and tells me that about an hour or so after eating she developed severe right lower quadrant cramping so much so that she has avoided eating today.  Patient is very eager to go home.  She is asking me what she needs to do in order to be released.  Tells me she has a dog waiting on her.   Objective   Vital signs in last 24 hours: Temp:  [98.2 F (36.8 C)-98.7 F (37.1 C)] 98.2 F (36.8 C) (09/19 1041) Pulse Rate:  [59-65] 61 (09/19 1041) Resp:  [18-19] 18 (09/19 1041) BP: (115-151)/(70-81) 144/81 (09/19 1041) SpO2:  [94 %-97 %] 97 % (09/19 1041) Last BM Date: 04/19/21 General:    Elderly white female in NAD Heart:  Regular rate and rhythm; no murmurs Lungs: Respirations even and unlabored, lungs CTA bilaterally Abdomen:  Soft, moderate RLQ ttp and nondistended. Normal bowel sounds. Psych:  Cooperative. Normal mood and affect.  Intake/Output from previous day: 09/18 0701 - 09/19 0700 In: 240 [P.O.:240] Out: -    Lab Results: Recent Labs    04/18/21 0328 04/19/21 0328 04/20/21 0304  WBC 12.2* 10.0 9.4  HGB 12.1 11.6* 11.8*  HCT 36.0 34.6* 35.4*  PLT 308 295 298   BMET Recent Labs    04/18/21 0328 04/19/21 0328 04/20/21 0304  NA 141 142 142  K 4.5 4.2 4.0  CL 106 109 107  CO2 '26 25 28  '$ GLUCOSE 100* 85 101*  BUN '11 9 14  '$ CREATININE 0.94 0.99 1.14*  CALCIUM 9.3 9.2 9.3      Assessment / Plan:   Assessment: 1.  Abdominal pain/hematochezia: CT initially concerning for possible ischemic colitis,  hemoglobin has been stable, does have persistent abdominal cramping, no further bloody bowel movements but does continue with a degree of right lower quadrant cramping, worse after eating 2.  CAD: Plavix-this has been held during hospitalization  Plan: 1.  Again continue antibiotics for total of 5 to 7-day course 2.  Continue IV fluids for supportive measures to maintain urine output 3.  Patient can continue on soft diet today. 4.  Increased Dicyclomine to 20 mg and scheduled this out so that she can have a dose 20 to 30 minutes before eating and at bedtime.  Hopefully this will help prevent cramping. 5.  Okay for Tylenol if continues with pain 6.  Again consideration for outpatient colonoscopy in 6 weeks, patient will need follow-up in our clinic when discharged so this can be arranged with either Dr. Henrene Pastor or APP 7.  Had long discussion with patient today as she is wanting to go home.  Explained that we cannot keep her in the hospital against her will, but would recommend that we get her at least one day with no pain while eating.  Discussed increasing Dicyclomine as above.  If the patient is able to eat lunch after using 20 mg of Dicyclomine with no problems then could discuss discharge later today with her  hospitalist team 8. With no further bleeding and stable hgb would resume Plavix  Thank you for your kind consultation, we will continue to follow   LOS: 2 days   Levin Erp  04/20/2021, 10:43 AM

## 2021-04-20 NOTE — Telephone Encounter (Signed)
Pt was scheduled for an OV on  05/06/21 @ 1:30 with Tye Savoy NP. Pt notified via My Chart

## 2021-04-20 NOTE — Progress Notes (Signed)
Pt taken out via w/c by Glenard Haring, NT.

## 2021-04-20 NOTE — Telephone Encounter (Signed)
-----   Message from Yetta Flock, MD sent at 04/20/2021  2:06 PM EDT ----- Regarding: perry outpatient follow up Vaughan Basta can you help coordinate outpatient follow up with Dr. Henrene Pastor outpatient in 2-3 weeks, either with him or APP. Thanks  Dr. Loni Muse

## 2021-04-20 NOTE — Progress Notes (Signed)
1015- Pt ambulating in hallway with face mask on in nad.   1200- Bentyl given per order and pt sitting up in bed ready to order lunch. No complaints voiced at this time. Will monitor. Call bell within reach.  1430- Pt tolerated lunch well. Ate 100% of lunch without any GI issues.  1736-  Flu vaccine given in left deltoid and pt's discharge instructions reviewed. All concerns addressed.  1803- Pt discharged home with son via w/c by Glenard Haring, [place]. Pt is nad and voices no complaints.

## 2021-04-20 NOTE — Discharge Summary (Signed)
Physician Discharge Summary  [name]:[id number] DOB: 1955/09/04 DOA: 04/17/2021  PCP: Darreld Mclean, MD  Admit date: 04/17/2021 Discharge date: 04/20/2021  Admitted From: Home Disposition: Home  Recommendations for Outpatient Follow-up:  Follow up with PCP in 1-2 weeks Please obtain BMP/CBC in one week Please follow up with GI as scheduled  Home Health: None Equipment/Devices: None  Discharge Condition: Stable CODE STATUS: Full Diet recommendation: Soft bland diet over the next 2 weeks, advance back to regular diet thereafter  Brief/Interim Summary: [name] is a 65 y.o. female with medical history significant of female with a history of CAD (possible spontaneous coronary artery dissection), NSTEMI, GERD, colonic polyps, hypothyroidism, ischemic colitis. Patient presented secondary to diarrhea and cramping which transitioned to hematochezia with profound right lower quadrant cramping pain.  GI consulted for suspected acute GI bleed.   Assessment & Plan:   Symptomatic acute GI bleed with questionable infectious colitis, POA, resolved -GI consulted, appreciate insight recommendations  -Hemoglobin stable, no indication for endoscopy, no melena since admission despite outpatient reports of large-volume melanotic stool prior to admission -Continue Augmentin for additional 5 days to complete coverage for presumed infectious colitis given abdominal pain. -Patient tolerating p.o. well today with soft bland diet, recommend continuing this outpatient  COVID-19 infection, previously diagnosed and resolved, POA Old infection - no longer active/infectious or requiring treatment   Questionable UTI, subacute, POA -Patient does not meet sepsis criteria  - Outpatient urine culture significant for E. coli resistant to ampicillin -completed ceftriaxone course here   CKD stage IIIa, stable at baseline -At baseline, follow outpatient as scheduled   Hyponatremia -  Mild. Asymptomatic.   Primary hypertension -Continue metoprolol and amlodipine only, blood pressure moderately well controlled, discontinue all other blood pressure medications as discussed, concern for hypotension at home which may have precipitated colitis   CAD History of unstable angina History of NSTEMI (possible spontaneous coronary artery dissection but not confirmed) -Okay to resume Plavix, hold aspirin until outpatient follow-up   Hypothyroidism -Continue Synthroid pending reconciliation   GERD -Protonix   Obesity Body mass index is 37.2 kg/m.   Discharge Instructions  Discharge Instructions     Call MD for:  difficulty breathing, headache or visual disturbances   Complete by: As directed    Call MD for:  persistant nausea and vomiting   Complete by: As directed    Call MD for:  severe uncontrolled pain   Complete by: As directed    Call MD for:  temperature >100.4   Complete by: As directed    Diet - low sodium heart healthy   Complete by: As directed    Increase activity slowly   Complete by: As directed       Allergies as of 04/20/2021   No Known Allergies      Medication List     STOP taking these medications    aspirin 81 MG EC tablet   isosorbide mononitrate 60 MG 24 hr tablet Commonly known as: IMDUR   lisinopril 5 MG tablet Commonly known as: ZESTRIL       TAKE these medications    ALPRAZolam 0.5 MG tablet Commonly known as: XANAX TAKE 1 TABLET BY MOUTH 3 TIMES DAILY AS NEEDED FOR ANXIETY What changed:  how much to take how to take this when to take this additional instructions   amLODipine 5 MG tablet Commonly known as: NORVASC TAKE ONE TABLET BY MOUTH ONE TIME DAILY   amoxicillin-clavulanate 875-125 MG tablet Commonly  known as: AUGMENTIN Take 1 tablet by mouth every 12 (twelve) hours for 5 days.   clopidogrel 75 MG tablet Commonly known as: PLAVIX Take 1 tablet by mouth once a day with breakfast   dicyclomine 10 MG  capsule Commonly known as: BENTYL Take 2 capsules (20 mg total) by mouth 4 (four) times daily -  before meals and at bedtime.   estradiol 0.5 MG tablet Commonly known as: ESTRACE Take 0.5 mg by mouth daily.   metoprolol tartrate 25 MG tablet Commonly known as: LOPRESSOR Take 1 tablet (25 mg total) by mouth 2 (two) times daily. What changed: when to take this   nitroGLYCERIN 0.4 MG SL tablet Commonly known as: NITROSTAT Place 1 tablet (0.4 mg total) under the tongue every 5 (five) minutes as needed for chest pain.   pantoprazole 40 MG tablet Commonly known as: PROTONIX Take 1 tablet (40 mg total) by mouth daily.   ranolazine 1000 MG SR tablet Commonly known as: RANEXA TAKE ONE TABLET BY MOUTH TWICE DAILY What changed: when to take this   Repatha SureClick 161 MG/ML Soaj Generic drug: Evolocumab INJECT 140MG  INTO THE SKIN ONCE EVERY 14 DAYS        No Known Allergies  Consultations: GI  Procedures/Studies: DG Chest 2 View  Result Date: 04/08/2021 CLINICAL DATA:  65 year old female with a history COVID positive test EXAM: CHEST - 2 VIEW COMPARISON:  02/03/2013 FINDINGS: Cardiomediastinal silhouette unchanged in size and contour. No evidence of central vascular congestion. No interlobular septal thickening. No pneumothorax or pleural effusion. Coarsened interstitial markings, with no confluent airspace disease. No acute displaced fracture. Degenerative changes of the spine. IMPRESSION: No active cardiopulmonary disease. Electronically Signed   By: Corrie Mckusick D.O.   On: 04/08/2021 13:59   CT Angio Abd/Pel W and/or Wo Contrast  Result Date: 04/17/2021 CLINICAL DATA:  Acute mesenteric ischemia.  Passing blood in stool EXAM: CTA ABDOMEN AND PELVIS WITHOUT AND WITH CONTRAST TECHNIQUE: Multidetector CT imaging of the abdomen and pelvis was performed using the standard protocol during bolus administration of intravenous contrast. Multiplanar reconstructed images and MIPs were  obtained and reviewed to evaluate the vascular anatomy. CONTRAST:  174mL OMNIPAQUE IOHEXOL 350 MG/ML SOLN COMPARISON:  02/17/2021 MR enterography. CTA of the abdomen 08/13/2011 FINDINGS: VASCULAR Aorta: Atheromatous plaque intermittently seen throughout the aorta. No aneurysm or dissection. Celiac: No proximal stenosis or dissection. Diffuse atheromatous plaque along the splenic artery. SMA: Mild atheromatous plaque proximally. No flow limiting stenosis or major branch occlusion. Renals: Mild plaque and narrowing at the left renal artery ostium. No stenosis or dissection. IMA: Patent Inflow: Diffuse atheromatous plaque.  No dissection or aneurysm Proximal Outflow: Mild atherosclerosis. Veins: Systemic and portal venous vessels are diffusely patent. Review of the MIP images confirms the above findings. NON-VASCULAR Hepatobiliary: No focal liver abnormality.No evidence of biliary obstruction or stone. Pancreas: Unremarkable. Spleen: Unremarkable. Adrenals/Urinary Tract: Negative adrenals. No hydronephrosis or stone. Prominent but symmetric urothelium at the level of the upper ureters and renal pelvis and bladder. No convincing bladder wall thickening given collapse. Stomach/Bowel: Colonic thickening with submucosal low-density edematous appearance, spanning the proximal transverse colon to sigmoid. There also discontinuous areas of small bowel thickening and mesenteric edema proximally. No nonenhancing segments, perforation, or obstruction. This patchy involvement suggests an inflammatory process, although there is known history of ischemic colitis. Lymphatic:  no mass or adenopathy. Reproductive:Hysterectomy Other: No ascites or pneumoperitoneum. Musculoskeletal: Lumbar spine degeneration with L5-S1 anterior lumbar interbody fusion. IMPRESSION: 1. Extensive colitis and patchy  enteritis spanning different visceral distributions and not associated with any occlusive vascular disease. 2. Urothelial prominence diffusely,  please correlate with urinalysis. Electronically Signed   By: Jorje Guild M.D.   On: 04/17/2021 04:51     Subjective: No acute issues or events overnight, tolerating p.o. well today denies nausea vomiting diarrhea constipation headache fevers chills or chest pain   Discharge Exam: Vitals:   04/20/21 0518 04/20/21 1041  BP: (!) 151/81 (!) 144/81  Pulse: (!) 59 61  Resp:  18  Temp: 98.3 F (36.8 C) 98.2 F (36.8 C)  SpO2: 95% 97%   Vitals:   04/19/21 1500 04/19/21 2101 04/20/21 0518 04/20/21 1041  BP: 115/77 129/70 (!) 151/81 (!) 144/81  Pulse: (!) 59 65 (!) 59 61  Resp:  19  18  Temp: 98.7 F (37.1 C) 98.2 F (36.8 C) 98.3 F (36.8 C) 98.2 F (36.8 C)  TempSrc: Oral Oral Oral Axillary  SpO2: 94% 94% 95% 97%  Weight:      Height:        General: Pt is alert, awake, not in acute distress Cardiovascular: RRR, S1/S2 +, no rubs, no gallops Respiratory: CTA bilaterally, no wheezing, no rhonchi Abdominal: Soft, NT, ND, bowel sounds + Extremities: no edema, no cyanosis    The results of significant diagnostics from this hospitalization (including imaging, microbiology, ancillary and laboratory) are listed below for reference.     Microbiology: Recent Results (from the past 240 hour(s))  Urine Culture     Status: Abnormal   Collection Time: 04/14/21  4:34 PM   Specimen: Urine  Result Value Ref Range Status   MICRO NUMBER: [id number]  Final   SPECIMEN QUALITY: Adequate  Final   Sample Source NOT GIVEN  Final   STATUS: FINAL  Final   ISOLATE 1: Escherichia coli (A)  Final    Comment: Greater than 100,000 CFU/mL of Escherichia coli      Susceptibility   Escherichia coli - URINE CULTURE, REFLEX    AMOX/CLAVULANIC 8 Sensitive     AMPICILLIN >=32 Resistant     AMPICILLIN/SULBACTAM 16 Intermediate     CEFAZOLIN* <=4 Not Reportable      * For infections other than uncomplicated UTI caused by E. coli, K. pneumoniae or P. mirabilis: Cefazolin is resistant if MIC > or = 8  mcg/mL. (Distinguishing susceptible versus intermediate for isolates with MIC < or = 4 mcg/mL requires additional testing.) For uncomplicated UTI caused by E. coli, K. pneumoniae or P. mirabilis: Cefazolin is susceptible if MIC <32 mcg/mL and predicts susceptible to the oral agents cefaclor, cefdinir, cefpodoxime, cefprozil, cefuroxime, cephalexin and loracarbef.     CEFTAZIDIME <=1 Sensitive     CEFEPIME <=1 Sensitive     CEFTRIAXONE <=1 Sensitive     CIPROFLOXACIN <=0.25 Sensitive     LEVOFLOXACIN <=0.12 Sensitive     GENTAMICIN <=1 Sensitive     IMIPENEM <=0.25 Sensitive     NITROFURANTOIN <=16 Sensitive     PIP/TAZO <=4 Sensitive     TOBRAMYCIN <=1 Sensitive     TRIMETH/SULFA* <=20 Sensitive      * For infections other than uncomplicated UTI caused by E. coli, K. pneumoniae or P. mirabilis: Cefazolin is resistant if MIC > or = 8 mcg/mL. (Distinguishing susceptible versus intermediate for isolates with MIC < or = 4 mcg/mL requires additional testing.) For uncomplicated UTI caused by E. coli, K. pneumoniae or P. mirabilis: Cefazolin is susceptible if MIC <32 mcg/mL and predicts susceptible to the oral  agents cefaclor, cefdinir, cefpodoxime, cefprozil, cefuroxime, cephalexin and loracarbef. Legend: S = Susceptible  I = Intermediate R = Resistant  NS = Not susceptible * = Not tested  NR = Not reported **NN = See antimicrobic comments   Resp Panel by RT-PCR (Flu A&B, Covid) Nasopharyngeal Swab     Status: Abnormal   Collection Time: 04/17/21  5:34 AM   Specimen: Nasopharyngeal Swab; Nasopharyngeal(NP) swabs in vial transport medium  Result Value Ref Range Status   SARS Coronavirus 2 by RT PCR POSITIVE (A) NEGATIVE Final    Comment: RESULT CALLED TO, READ BACK BY AND VERIFIED WITH: Jorge Ny, RN ON 04/17/21 AT 0641 BY JAG (NOTE) SARS-CoV-2 target nucleic acids are DETECTED.  The SARS-CoV-2 RNA is generally detectable in upper respiratory specimens during the  acute phase of infection. Positive results are indicative of the presence of the identified virus, but do not rule out bacterial infection or co-infection with other pathogens not detected by the test. Clinical correlation with patient history and other diagnostic information is necessary to determine patient infection status. The expected result is Negative.  Fact Sheet for Patients: EntrepreneurPulse.com.au  Fact Sheet for Healthcare Providers: IncredibleEmployment.be  This test is not yet approved or cleared by the [place] FDA and  has been authorized for detection and/or diagnosis of SARS-CoV-2 by FDA under an Emergency Use Authorization (EUA).  This EUA will remain in effect (meaning  this test can be used) for the duration of  the COVID-19 declaration under Section 564(b)(1) of the Act, 21 U.S.C. section 360bbb-3(b)(1), unless the authorization is terminated or revoked sooner.     Influenza A by PCR NEGATIVE NEGATIVE Final   Influenza B by PCR NEGATIVE NEGATIVE Final    Comment: (NOTE) The Xpert Xpress SARS-CoV-2/FLU/RSV plus assay is intended as an aid in the diagnosis of influenza from Nasopharyngeal swab specimens and should not be used as a sole basis for treatment. Nasal washings and aspirates are unacceptable for Xpert Xpress SARS-CoV-2/FLU/RSV testing.  Fact Sheet for Patients: EntrepreneurPulse.com.au  Fact Sheet for Healthcare Providers: IncredibleEmployment.be  This test is not yet approved or cleared by the [place] FDA and has been authorized for detection and/or diagnosis of SARS-CoV-2 by FDA under an Emergency Use Authorization (EUA). This EUA will remain in effect (meaning this test can be used) for the duration of the COVID-19 declaration under Section 564(b)(1) of the Act, 21 U.S.C. section 360bbb-3(b)(1), unless the authorization is terminated or revoked.  Performed at  [facility], [address] [place], [place] [address]      Labs: BNP (last 3 results) No results for input(s): BNP in the last 8760 hours. Basic Metabolic Panel: Recent Labs  Lab 04/17/21 0356 04/18/21 0328 04/19/21 0328 04/20/21 0304  NA 130* 141 142 142  K 4.3 4.5 4.2 4.0  CL 100 106 109 107  CO2 23 26 25 28   GLUCOSE 144* 100* 85 101*  BUN 21 11 9 14   CREATININE 1.46* 0.94 0.99 1.14*  CALCIUM 9.0 9.3 9.2 9.3   Liver Function Tests: Recent Labs  Lab 04/17/21 0356  AST 16  ALT 13  ALKPHOS 87  BILITOT 0.2*  PROT 7.4  ALBUMIN 3.4*   No results for input(s): LIPASE, AMYLASE in the last 168 hours. No results for input(s): AMMONIA in the last 168 hours. CBC: Recent Labs  Lab 04/17/21 0356 04/17/21 1709 04/18/21 0328 04/19/21 0328 04/20/21 0304  WBC 12.8*  --  12.2* 10.0 9.4  NEUTROABS  10.2*  --   --   --   --   HGB 12.5 12.3 12.1 11.6* 11.8*  HCT 35.7* 35.8* 36.0 34.6* 35.4*  MCV 89.3  --  91.8 92.5 91.2  PLT 321  --  308 295 298   Cardiac Enzymes: No results for input(s): CKTOTAL, CKMB, CKMBINDEX, TROPONINI in the last 168 hours. BNP: Invalid input(s): POCBNP CBG: No results for input(s): GLUCAP in the last 168 hours. D-Dimer No results for input(s): DDIMER in the last 72 hours. Hgb A1c No results for input(s): HGBA1C in the last 72 hours. Lipid Profile No results for input(s): CHOL, HDL, LDLCALC, TRIG, CHOLHDL, LDLDIRECT in the last 72 hours. Thyroid function studies No results for input(s): TSH, T4TOTAL, T3FREE, THYROIDAB in the last 72 hours.  Invalid input(s): FREET3 Anemia work up No results for input(s): VITAMINB12, FOLATE, FERRITIN, TIBC, IRON, RETICCTPCT in the last 72 hours. Urinalysis    Component Value Date/Time   COLORURINE YELLOW 04/17/2021 0356   APPEARANCEUR CLEAR 04/17/2021 0356   LABSPEC 1.015 04/17/2021 0356   PHURINE 6.0 04/17/2021 0356   GLUCOSEU NEGATIVE 04/17/2021 0356   HGBUR SMALL (A) 04/17/2021 0356    BILIRUBINUR NEGATIVE 04/17/2021 0356   BILIRUBINUR negative 04/14/2021 1527   KETONESUR NEGATIVE 04/17/2021 0356   PROTEINUR NEGATIVE 04/17/2021 0356   UROBILINOGEN 0.2 04/14/2021 1527   UROBILINOGEN 0.2 12/11/2012 0809   NITRITE NEGATIVE 04/17/2021 0356   LEUKOCYTESUR NEGATIVE 04/17/2021 0356   Sepsis Labs Invalid input(s): PROCALCITONIN,  WBC,  LACTICIDVEN Microbiology Recent Results (from the past 240 hour(s))  Urine Culture     Status: Abnormal   Collection Time: 04/14/21  4:34 PM   Specimen: Urine  Result Value Ref Range Status   MICRO NUMBER: [id number]  Final   SPECIMEN QUALITY: Adequate  Final   Sample Source NOT GIVEN  Final   STATUS: FINAL  Final   ISOLATE 1: Escherichia coli (A)  Final    Comment: Greater than 100,000 CFU/mL of Escherichia coli      Susceptibility   Escherichia coli - URINE CULTURE, REFLEX    AMOX/CLAVULANIC 8 Sensitive     AMPICILLIN >=32 Resistant     AMPICILLIN/SULBACTAM 16 Intermediate     CEFAZOLIN* <=4 Not Reportable      * For infections other than uncomplicated UTI caused by E. coli, K. pneumoniae or P. mirabilis: Cefazolin is resistant if MIC > or = 8 mcg/mL. (Distinguishing susceptible versus intermediate for isolates with MIC < or = 4 mcg/mL requires additional testing.) For uncomplicated UTI caused by E. coli, K. pneumoniae or P. mirabilis: Cefazolin is susceptible if MIC <32 mcg/mL and predicts susceptible to the oral agents cefaclor, cefdinir, cefpodoxime, cefprozil, cefuroxime, cephalexin and loracarbef.     CEFTAZIDIME <=1 Sensitive     CEFEPIME <=1 Sensitive     CEFTRIAXONE <=1 Sensitive     CIPROFLOXACIN <=0.25 Sensitive     LEVOFLOXACIN <=0.12 Sensitive     GENTAMICIN <=1 Sensitive     IMIPENEM <=0.25 Sensitive     NITROFURANTOIN <=16 Sensitive     PIP/TAZO <=4 Sensitive     TOBRAMYCIN <=1 Sensitive     TRIMETH/SULFA* <=20 Sensitive      * For infections other than uncomplicated UTI caused by E. coli, K. pneumoniae or  P. mirabilis: Cefazolin is resistant if MIC > or = 8 mcg/mL. (Distinguishing susceptible versus intermediate for isolates with MIC < or = 4 mcg/mL requires additional testing.) For uncomplicated UTI caused by E. coli, K. pneumoniae or  P. mirabilis: Cefazolin is susceptible if MIC <32 mcg/mL and predicts susceptible to the oral agents cefaclor, cefdinir, cefpodoxime, cefprozil, cefuroxime, cephalexin and loracarbef. Legend: S = Susceptible  I = Intermediate R = Resistant  NS = Not susceptible * = Not tested  NR = Not reported **NN = See antimicrobic comments   Resp Panel by RT-PCR (Flu A&B, Covid) Nasopharyngeal Swab     Status: Abnormal   Collection Time: 04/17/21  5:34 AM   Specimen: Nasopharyngeal Swab; Nasopharyngeal(NP) swabs in vial transport medium  Result Value Ref Range Status   SARS Coronavirus 2 by RT PCR POSITIVE (A) NEGATIVE Final    Comment: RESULT CALLED TO, READ BACK BY AND VERIFIED WITH: Jorge Ny, RN ON 04/17/21 AT 0641 BY JAG (NOTE) SARS-CoV-2 target nucleic acids are DETECTED.  The SARS-CoV-2 RNA is generally detectable in upper respiratory specimens during the acute phase of infection. Positive results are indicative of the presence of the identified virus, but do not rule out bacterial infection or co-infection with other pathogens not detected by the test. Clinical correlation with patient history and other diagnostic information is necessary to determine patient infection status. The expected result is Negative.  Fact Sheet for Patients: EntrepreneurPulse.com.au  Fact Sheet for Healthcare Providers: IncredibleEmployment.be  This test is not yet approved or cleared by the [place] FDA and  has been authorized for detection and/or diagnosis of SARS-CoV-2 by FDA under an Emergency Use Authorization (EUA).  This EUA will remain in effect (meaning  this test can be used) for the duration of  the COVID-19  declaration under Section 564(b)(1) of the Act, 21 U.S.C. section 360bbb-3(b)(1), unless the authorization is terminated or revoked sooner.     Influenza A by PCR NEGATIVE NEGATIVE Final   Influenza B by PCR NEGATIVE NEGATIVE Final    Comment: (NOTE) The Xpert Xpress SARS-CoV-2/FLU/RSV plus assay is intended as an aid in the diagnosis of influenza from Nasopharyngeal swab specimens and should not be used as a sole basis for treatment. Nasal washings and aspirates are unacceptable for Xpert Xpress SARS-CoV-2/FLU/RSV testing.  Fact Sheet for Patients: EntrepreneurPulse.com.au  Fact Sheet for Healthcare Providers: IncredibleEmployment.be  This test is not yet approved or cleared by the [place] FDA and has been authorized for detection and/or diagnosis of SARS-CoV-2 by FDA under an Emergency Use Authorization (EUA). This EUA will remain in effect (meaning this test can be used) for the duration of the COVID-19 declaration under Section 564(b)(1) of the Act, 21 U.S.C. section 360bbb-3(b)(1), unless the authorization is terminated or revoked.  Performed at [facility], [address] [place], [place] [address]      Time coordinating discharge: Over 30 minutes  SIGNED:   Little Ishikawa, DO [facility] 04/20/2021, 2:15 PM Pager   If 7PM-7AM, please contact night-coverage [email or web address]

## 2021-04-22 NOTE — Telephone Encounter (Signed)
**Note De-Identified [facility] Archam[facility]au Obfuscation** [facility] Key: Darryll Capers - PA Case ID: [id number] - Rx #: [id number] Outcome: Approved on September 2 Your PA request has been approved. Additional information will be provided in the approval communication.  Drug: Pantoprazole Sodium 40MG  dr tablets Form: [employer] Electronic PA Form (2017 NCPDP)   I have notified [place] of this approval

## 2021-04-24 ENCOUNTER — Telehealth: Payer: Self-pay | Admitting: *Deleted

## 2021-04-24 NOTE — Telephone Encounter (Signed)
Transition Care Management Unsuccessful Follow-up Telephone Call  Date of discharge and from where:  04/20/2021  [facility]  Attempts:  1st Attempt  Reason for unsuccessful TCM follow-up call:  Left voice message  Jacqlyn Larsen [facility], BSN RN Case Manager [phone]

## 2021-04-25 NOTE — Progress Notes (Addendum)
[facility] [address] [place], [place] [address] [phone]  Date:  04/27/2021   Name:  [name]   DOB:  Oct 06, 1955   MRN:  [id number]  PCP:  Darreld Mclean, MD    Chief Complaint: Hospitalization Follow-up (04-17-2021: GI bleed/Concerns/ questions: none/Flu shot today:done in the hospital /)   History of Present Illness:  [name] is a 65 y.o. very pleasant female patient who presents with the following:  Patient seen today for hospital follow-up Most recent visit with myself was in June.  History of HTN, CAD/NSTEMI, obesity, hyperlipidemia, IBS, hypothyroidism She was admitted recently as below with a GI bleed  Admit date: 04/17/2021 Discharge date: 04/20/2021 Recommendations for Outpatient Follow-up:  Follow up with PCP in 1-2 weeks Please obtain BMP/CBC in one week Please follow up with GI as scheduled Brief/Interim Summary: [name] is a 64 y.o. female with medical history significant of female with a history of CAD (possible spontaneous coronary artery dissection), NSTEMI, GERD, colonic polyps, hypothyroidism, ischemic colitis. Patient presented secondary to diarrhea and cramping which transitioned to hematochezia with profound right lower quadrant cramping pain.  GI consulted for suspected acute GI bleed. Assessment & Plan: Symptomatic acute GI bleed with questionable infectious colitis, POA, resolved -GI consulted, appreciate insight recommendations  -Hemoglobin stable, no indication for endoscopy, no melena since admission despite outpatient reports of large-volume melanotic stool prior to admission -Continue Augmentin for additional 5 days to complete coverage for presumed infectious colitis given abdominal pain. -Patient tolerating p.o. well today with soft bland diet, recommend continuing this outpatient COVID-19 infection, previously diagnosed and resolved, POA Old  infection - no longer active/infectious or requiring treatment Questionable UTI, subacute, POA -Patient does not meet sepsis criteria  - Outpatient urine culture significant for E. coli resistant to ampicillin -completed ceftriaxone course here CKD stage IIIa, stable at baseline -At baseline, follow outpatient as scheduled Hyponatremia - Mild. Asymptomatic. Primary hypertension -Continue metoprolol and amlodipine only, blood pressure moderately well controlled, discontinue all other blood pressure medications as discussed, concern for hypotension at home which may have precipitated colitis CAD History of unstable angina History of NSTEMI (possible spontaneous coronary artery dissection but not confirmed) -Okay to resume Plavix, hold aspirin until outpatient follow-up Hypothyroidism -Continue Synthroid pending reconciliation GERD -Protonix Obesity Body mass index is 37.2 kg/m.  They stopped her imdur and her lisinopril due to hypotension -currently taking metoprolol and amlodipine only.  She has not noted symptoms of hypotension She is a bit concerned about stopping his medications, would like to make sure Dr. Gwenlyn Found is okay with the change She is still on a soft diet She is seeing GI next week for follow-up She had some abd discomfort yesterday but ok today No vomiting She is having soft stools-can be diarrhea No bloody of black stools  Wt Readings from Last 3 Encounters:  04/27/21 217 lb (98.4 kg)  04/17/21 210 lb (95.3 kg)  04/14/21 216 lb (98 kg)    Patient Active Problem List   Diagnosis Date Noted   Acute GI bleeding 04/17/2021   Colitis 04/17/2021   Lower abdominal pain 01/28/2021   Dyspnea on exertion 10/24/2019   Pre-diabetes 01/14/2017   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   Tachycardia, somewhat irregular, episodic 10/08/2013   Dizziness 10/08/2013   Angina effort 05/22/2013   Unstable angina ([place]) 01/22/2013   Hyperlipidemia 01/01/2013   CAD (coronary  artery disease), possible  SCAD 12/14/2012   NSTEMI (non-ST elevated myocardial infarction) ([place]) 12/10/2012   Essential hypertension 12/10/2012   Hypothyroidism 12/10/2012   Obesity (BMI 35.0-39.9 without comorbidity) 12/10/2012   Gastroenteritis 10/08/2011   Acute ischemic colitis ([place]) 08/09/2011   DYSPHAGIA UNSPECIFIED 07/01/2010   RECTAL BLEEDING Jan 2013 11/04/2009   GERD 02/11/2009   Irritable bowel syndrome 02/11/2009   Generalized abdominal pain 02/11/2009   PERSONAL HX COLONIC POLYPS 02/11/2009    Past Medical History:  Diagnosis Date   Angina effort 05/22/2013   Anxiety    CAD (coronary artery disease), possible SCAD 12/14/2012   Colon polyps    Duodenitis    Dyspnea on exertion    LEXISCAN, 09/30/2008 - normal, EKG negative for ischemia, no ECG changes   Esophageal stricture    Gastritis    GERD (gastroesophageal reflux disease)    Gout    "only from RX given after MI" (01/22/2013)   H/O hiatal hernia    History of esophageal stricture    Hypertension    2D ECHO, 09/30/2008 - EF >55%, normal: RENAL DOPPLER, 03/21/2007 - normal duplex study   Hypothyroidism    IBS (irritable bowel syndrome)    Ischemic colitis ([place])    Lower GI bleeding    10/07/11 "I've had 3 episodes in the past year"   Myocardial infarction ([place]) 12/10/2012   "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)   Tubular adenoma polyp of rectum 2008    Past Surgical History:  Procedure Laterality Date   ABDOMINAL HYSTERECTOMY  1999   ANTERIOR LUMBAR FUSION  2010   "L4-5" (01/22/2013)   BACK SURGERY  09/2008   FUSION/l4-5   CARDIAC CATHETERIZATION N/A 02/06/2015   Procedure: Left Heart Cath and Coronary Angiography;  Surgeon: Leonie Man, MD;  Location: [facility];  Service: Cardiovascular;  Laterality: N/A;   ESOPHAGEAL DILATION  08/2010   ESOPHAGEAL DILATION     "once or twice" (01/22/2013)   HAMMER TOE SURGERY Bilateral ~ 2002   LEFT HEART CATHETERIZATION WITH CORONARY ANGIOGRAM N/A  12/11/2012   Procedure: LEFT HEART CATHETERIZATION WITH CORONARY ANGIOGRAM;  Surgeon: Leonie Man, MD;  Location: [facility] CATH LAB;  Service: Cardiovascular;  Laterality: N/A;   LEFT HEART CATHETERIZATION WITH CORONARY ANGIOGRAM N/A 12/15/2012   Procedure: LEFT HEART CATHETERIZATION WITH CORONARY ANGIOGRAM;  Surgeon: Leonie Man, MD;  Location: [facility] CATH LAB;  Service: Cardiovascular;  Laterality: N/A;   PERIPHERAL VENOUS STUDY  10/03/2008   No evidence of DVT, superficial thrombosis, or Baker's cyst   TONSILLECTOMY AND ADENOIDECTOMY  1960's   TUBAL LIGATION  1985    Social History   Tobacco Use   Smoking status: Never   Smokeless tobacco: Never  Vaping Use   Vaping Use: Never used  Substance Use Topics   Alcohol use: No   Drug use: No    Family History  Problem Relation Age of Onset   Colon cancer Brother 38   Kidney disease Mother    Stroke Father    Multiple myeloma Sister    Heart attack Brother    Other Brother    Other Brother     No Known Allergies  Medication list has been reviewed and updated.  Current Outpatient Medications on File Prior to Visit  Medication Sig Dispense Refill   ALPRAZolam (XANAX) 0.5 MG tablet TAKE 1 TABLET BY MOUTH 3 TIMES DAILY AS NEEDED FOR ANXIETY (Patient taking differently: Take 0.5 mg by mouth in the morning.) 90 tablet 2  amLODipine (NORVASC) 5 MG tablet TAKE ONE TABLET BY MOUTH ONE TIME DAILY 90 tablet 3   clopidogrel (PLAVIX) 75 MG tablet Take 1 tablet by mouth once a day with breakfast 90 tablet 3   dicyclomine (BENTYL) 10 MG capsule Take 2 capsules (20 mg total) by mouth 4 (four) times daily -  before meals and at bedtime. 240 capsule 0   estradiol (ESTRACE) 0.5 MG tablet Take 0.5 mg by mouth daily.     metoprolol tartrate (LOPRESSOR) 25 MG tablet Take 1 tablet (25 mg total) by mouth 2 (two) times daily. (Patient taking differently: Take 25 mg by mouth daily.) 180 tablet 3   nitroGLYCERIN (NITROSTAT) 0.4 MG SL tablet Place 1 tablet  (0.4 mg total) under the tongue every 5 (five) minutes as needed for chest pain. 25 tablet 3   pantoprazole (PROTONIX) 40 MG tablet Take 1 tablet (40 mg total) by mouth daily. 90 tablet 0   ranolazine (RANEXA) 1000 MG SR tablet TAKE ONE TABLET BY MOUTH TWICE DAILY (Patient taking differently: Take 1,000 mg by mouth daily.) 180 tablet 0   REPATHA SURECLICK 518 MG/ML SOAJ INJECT 140MG INTO THE SKIN ONCE EVERY 14 DAYS 2 mL 11   No current facility-administered medications on file prior to visit.    Review of Systems:  As per HPI- otherwise negative.    Physical Examination: Vitals:   04/27/21 1538  BP: 118/76  Pulse: 61  Resp: 18  Temp: 97.7 F (36.5 C)  SpO2: 96%   Vitals:   04/27/21 1538  Weight: 217 lb (98.4 kg)  Height: _0  (1.6 m)   Body mass index is 38.44 kg/m. Ideal Body Weight: Weight in (lb) to have BMI = 25: 140.8  GEN: no acute distress.  Obese, looks well HEENT: Atraumatic, Normocephalic.  Ears and Nose: No external deformity. CV: RRR, No M/G/R. No JVD. No thrill. No extra heart sounds. PULM: CTA B, some rhonchi in the bilateral bases.  No retractions. No resp. distress. No accessory muscle use. ABD: S, NT, ND, +BS. No rebound. No HSM. EXTR: No c/c/e PSYCH: Normally interactive. Conversant.    Assessment and Plan: Hospital discharge follow-up - Plan: CBC, Basic metabolic panel  Gastrointestinal hemorrhage, unspecified gastrointestinal hemorrhage type - Plan: CBC, Basic metabolic panel  Essential hypertension  Coronary artery disease involving native coronary artery of native heart without angina pectoris  Patient following up today from recent admission with GI bleed and colitis.  She is feeling significantly better but still not 100% Will plan further follow- up pending labs. There are some congestion in her lungs, likely due to inactivity.  She has an incentive spirometer, will start using it Advised that her blood pressure is still slightly soft,  I will touch base with her cardiologist about starting back on Imdur but would continue to hold lisinopril for now  GI follow-up next week  This visit occurred during the SARS-CoV-2 public health emergency.  Safety protocols were in place, including screening questions prior to the visit, additional usage of staff PPE, and extensive cleaning of exam room while observing appropriate contact time as indicated for disinfecting solutions.   Signed Lamar Blinks, MD   Addendum 9/27, received her labs as below and also a message from her cardiologist.  We will be in touch with patient  Hemoglobin is decreased slightly from 9/19-she has not noted any blood in her stool.  We will monitor closely Results for orders placed or performed in visit on 04/27/21  CBC  Result Value Ref Range   WBC 7.4 4.0 - 10.5 K/uL   RBC 3.58 (L) 3.87 - 5.11 Mil/uL   Platelets 289.0 150.0 - 400.0 K/uL   Hemoglobin 11.1 (L) 12.0 - 15.0 g/dL   HCT 32.8 (L) 36.0 - 46.0 %   MCV 91.5 78.0 - 100.0 fl   MCHC 34.0 30.0 - 36.0 g/dL   RDW 13.5 11.5 - 93.7 %  Basic metabolic panel  Result Value Ref Range   Sodium 136 135 - 145 mEq/L   Potassium 4.0 3.5 - 5.1 mEq/L   Chloride 101 96 - 112 mEq/L   CO2 25 19 - 32 mEq/L   Glucose, Bld 82 70 - 99 mg/dL   BUN 14 6 - 23 mg/dL   Creatinine, Ser 1.15 0.40 - 1.20 mg/dL   GFR 50.14 (L) >60.00 mL/min   Calcium 9.5 8.4 - 10.5 mg/dL   Thx for the F/U regarding our mutual pt [name]. If her BP is soft and she's not having ny CP I think it's OK to hold off on re starting Imdur and lisinopril Jess. She's a nice lady.

## 2021-04-27 ENCOUNTER — Ambulatory Visit (INDEPENDENT_AMBULATORY_CARE_PROVIDER_SITE_OTHER): Payer: Medicare Other | Admitting: Family Medicine

## 2021-04-27 ENCOUNTER — Other Ambulatory Visit: Payer: Self-pay

## 2021-04-27 ENCOUNTER — Telehealth: Payer: Self-pay

## 2021-04-27 VITALS — BP 118/76 | HR 61 | Temp 97.7°F | Resp 18 | Ht 63.0 in | Wt 217.0 lb

## 2021-04-27 DIAGNOSIS — I2510 Atherosclerotic heart disease of native coronary artery without angina pectoris: Secondary | ICD-10-CM

## 2021-04-27 DIAGNOSIS — K922 Gastrointestinal hemorrhage, unspecified: Secondary | ICD-10-CM | POA: Diagnosis not present

## 2021-04-27 DIAGNOSIS — I10 Essential (primary) hypertension: Secondary | ICD-10-CM | POA: Diagnosis not present

## 2021-04-27 DIAGNOSIS — Z09 Encounter for follow-up examination after completed treatment for conditions other than malignant neoplasm: Secondary | ICD-10-CM

## 2021-04-27 NOTE — Telephone Encounter (Signed)
Transition Care Management Unsuccessful Follow-up Telephone Call  Date of discharge and from where:  04/20/21 from [facility]  Attempts:  2nd Attempt  Reason for unsuccessful TCM follow-up call:  Left voice message    Thea Silversmith, RN, MSN, BSN, [place] Care Management Coordinator [phone]

## 2021-04-27 NOTE — Patient Instructions (Signed)
Good to see you - I am glad you are better!   I will be in touch with your labs and will contact Dr Gwenlyn Found Let me know if any concerns or problems Use your spirometer to help open up your lungs and get back into your walking as you get your strength back

## 2021-04-28 ENCOUNTER — Encounter: Payer: Self-pay | Admitting: Family Medicine

## 2021-04-28 LAB — CBC
HCT: 32.8 % — ABNORMAL LOW (ref 36.0–46.0)
Hemoglobin: 11.1 g/dL — ABNORMAL LOW (ref 12.0–15.0)
MCHC: 34 g/dL (ref 30.0–36.0)
MCV: 91.5 fl (ref 78.0–100.0)
Platelets: 289 10*3/uL (ref 150.0–400.0)
RBC: 3.58 Mil/uL — ABNORMAL LOW (ref 3.87–5.11)
RDW: 13.5 % (ref 11.5–15.5)
WBC: 7.4 10*3/uL (ref 4.0–10.5)

## 2021-04-28 LAB — BASIC METABOLIC PANEL
BUN: 14 mg/dL (ref 6–23)
CO2: 25 mEq/L (ref 19–32)
Calcium: 9.5 mg/dL (ref 8.4–10.5)
Chloride: 101 mEq/L (ref 96–112)
Creatinine, Ser: 1.15 mg/dL (ref 0.40–1.20)
GFR: 50.14 mL/min — ABNORMAL LOW (ref >60.00)
Glucose, Bld: 82 mg/dL (ref 70–99)
Potassium: 4 mEq/L (ref 3.5–5.1)
Sodium: 136 mEq/L (ref 135–145)

## 2021-05-06 ENCOUNTER — Telehealth: Payer: Self-pay | Admitting: Cardiovascular Disease

## 2021-05-06 ENCOUNTER — Ambulatory Visit (INDEPENDENT_AMBULATORY_CARE_PROVIDER_SITE_OTHER): Payer: Medicare Other | Admitting: Nurse Practitioner

## 2021-05-06 ENCOUNTER — Encounter: Payer: Self-pay | Admitting: Nurse Practitioner

## 2021-05-06 VITALS — BP 110/74 | HR 72 | Ht 61.25 in | Wt 214.5 lb

## 2021-05-06 DIAGNOSIS — I2510 Atherosclerotic heart disease of native coronary artery without angina pectoris: Secondary | ICD-10-CM | POA: Diagnosis not present

## 2021-05-06 DIAGNOSIS — D649 Anemia, unspecified: Secondary | ICD-10-CM | POA: Diagnosis not present

## 2021-05-06 DIAGNOSIS — Z86010 Personal history of colonic polyps: Secondary | ICD-10-CM | POA: Diagnosis not present

## 2021-05-06 DIAGNOSIS — K529 Noninfective gastroenteritis and colitis, unspecified: Secondary | ICD-10-CM

## 2021-05-06 NOTE — Patient Instructions (Addendum)
If you are age 65 or older, your body mass index should be between 23-30. Your Body mass index is 40.2 kg/m. If this is out of the aforementioned range listed, please consider follow up with your Primary Care Provider. __________________________________________________________  The [facility] providers would like to encourage you to use [facility] to communicate with providers for non-urgent requests or questions.  Due to long hold times on the telephone, sending your provider a message by [facility] may be a faster and more efficient way to get a response.  Please allow 48 business hours for a response.  Please remember that this is for non-urgent requests.   Your provider has requested that you go to the basement level for lab work in 2 weeks (05/20/2021) . Press "B" on the elevator. The lab is located at the first door on the left as you exit the elevator.  Follow up pending the results of your Lab or as needed.  Thank you for entrusting me with your care and choosing [facility].  Tye Savoy, NP-C

## 2021-05-06 NOTE — Telephone Encounter (Signed)
Pt c/o medication issue:  1. Name of Medication:  Imdur  Aspirin  2. How are you currently taking this medication (dosage and times per day)?  Patient is no longer taking these medications  3. Are you having a reaction (difficulty breathing--STAT)?  No   4. What is your medication issue?   Patient states she was recent admitted in the ED and the MD she saw in the RD took her off Imdur and Aspirin. She would like to make Dr. Gwenlyn Found aware and confirm that he agrees with this change.

## 2021-05-06 NOTE — Progress Notes (Signed)
[name], evaluation reviewed. This seems to be an acute process which is improved/improving.  I would favor expectant management at this point.  If she has recurrent problems, then colonoscopy could be considered.  Thanks [employer]

## 2021-05-06 NOTE — Progress Notes (Signed)
ASSESSMENT AND PLAN    # 65 yo female hospitalized last month with abdominal pain, bloody diarrhea and enterocolitis. CT angio with findings of colitis involving proximal transverse colon to sigmoid colon. Also discontinuous areas of small bowel wall thickening. Different visceral distributions were involved without associated with occlusive vascular disease. She  does have a history of ischemic colitis and this could have been the same but involvement of different visceral distributions is unusual. IBD seems unlikely. Infectious workup wasn't done as diarrhea resolved upon admission. Except for mild periumbilical discomfort patient's symptoms have resolved.  Bowel movements back to normal.  --Will review with Dr. Henrene Pastor, patient's primary GI as to whether he feels colonoscopy is warranted.    # Delta anemia in setting of recent enterocolitis / rectal bleeding 3 weeks ago. Hgb declined to 11.8 from baseline ~ 12. Since hospital discharge hgb has declined further to 11.1 in absence of further bleeding. --Not overly concerned at this point in absence of bleeding or ongoing symptoms. Will repeat CBC in two weeks.   # History of adenomatous colon polyps.  Two tiny tubular adenomas August 2019.  At it stands now she is on for a 5-year polyp surveillance colonoscopy.   # Chronic anti-platelet therapy. On Plavix.   HISTORY OF PRESENT ILLNESS    Chief Complaint : Hospital follow-up  [name] is a 65 y.o. female known to Dr. Henrene Pastor  with a past medical history significant for CAD / NSTEMI, possible coronary artery dissection, hypertension, hyperlipidemia, CKD 3, GERD, hiatal hernia/hiatal stricture status post previous dilation, colon polyps, chronic constipation prior ischemic colitis, COVID19.  See PMH below for any additional medical problems.    Patient was hospitalized mid September with abdominal pain, bloody diarrhea.  Hgb declined from baseline of 12 to only 11.6. CT angiogram  suggested  extensive colitis and patchy enteritis spanning different visceral distributions without associated occlusive vascular disease. Just prior to that admission you was diagnosed with COVID followed by UTI requiring antibiotics.  In the hospital her Plavix was held.  Stool studies were ordered and she was treated with antibiotics.  Stool studies were not able to be collected as diarrhea ceased upon admission.  Within a couple of days patient was able to be discharged.  She completed 5 days of antibiotics. She no longer needs the bentyl for abdominal pain. Her bowel movements are back to normal. She resumed Citrucel last week.  She has residual tenderness behind her umbilicus which she says feels musculoskeletal in nature.  Her ASA was stopped but she is back on plavix.   Since hospital discharge repeat CBC showed further decline in hgb from 11.8 to 11.1 ( down overall about 1 gram from baseline).    PREVIOUS ENDOSCOPIC EVALUATIONS / PERTINENT STUDIES:   August 2019 EGD - Benign-appearing esophageal stenosis. Dilated. - Normal stomach. - Normal examined duodenum.   August 2019 colonoscopy - Two 1 to 3 mm polyps in the rectum and in the cecum, removed with a cold snare. Resected and retrieved. Path c/w adenoma - The examination was otherwise normal on direct and retroflexion views  04/17/21 CT angio abd / pelvis  Colonic thickening with submucosal low-density edematous appearance, spanning the proximal transverse colon to sigmoid. There also discontinuous areas of small bowel thickening and mesenteric edema proximally. No nonenhancing segments, perforation, or obstruction. This patchy involvement suggests an inflammatory process, although there is known history of ischemic colitis. IMPRESSION: 1. Extensive colitis and patchy enteritis spanning different  visceral distributions and not associated with any occlusive vascular disease. 2. Urothelial prominence diffusely, please correlate  with urinalysis.    Past Medical History:  Diagnosis Date   Angina effort 05/22/2013   Anxiety    CAD (coronary artery disease), possible SCAD 12/14/2012   Colon polyps    Duodenitis    Dyspnea on exertion    LEXISCAN, 09/30/2008 - normal, EKG negative for ischemia, no ECG changes   Esophageal stricture    Gastritis    GERD (gastroesophageal reflux disease)    Gout    "only from RX given after MI" (01/22/2013)   H/O hiatal hernia    History of esophageal stricture    Hypertension    2D ECHO, 09/30/2008 - EF >55%, normal: RENAL DOPPLER, 03/21/2007 - normal duplex study   Hypothyroidism    IBS (irritable bowel syndrome)    Ischemic colitis ([place])    Lower GI bleeding    10/07/11 "I've had 3 episodes in the past year"   Myocardial infarction [facility]) 12/10/2012   "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)   Tubular adenoma polyp of rectum 2008    Current Medications, Allergies, Past Surgical History, Family History and Social History were reviewed in [employer] record.   Current Outpatient Medications  Medication Sig Dispense Refill   ALPRAZolam (XANAX) 0.5 MG tablet TAKE 1 TABLET BY MOUTH 3 TIMES DAILY AS NEEDED FOR ANXIETY (Patient taking differently: Take 0.5 mg by mouth in the morning.) 90 tablet 2   amLODipine (NORVASC) 5 MG tablet TAKE ONE TABLET BY MOUTH ONE TIME DAILY 90 tablet 3   clopidogrel (PLAVIX) 75 MG tablet Take 1 tablet by mouth once a day with breakfast 90 tablet 3   dicyclomine (BENTYL) 10 MG capsule Take 2 capsules (20 mg total) by mouth 4 (four) times daily -  before meals and at bedtime. 240 capsule 0   estradiol (ESTRACE) 0.5 MG tablet Take 0.5 mg by mouth daily.     metoprolol tartrate (LOPRESSOR) 25 MG tablet Take 1 tablet (25 mg total) by mouth 2 (two) times daily. (Patient taking differently: Take 25 mg by mouth daily.) 180 tablet 3   pantoprazole (PROTONIX) 40 MG tablet Take 1 tablet (40 mg total) by mouth daily. 90 tablet 0    ranolazine (RANEXA) 1000 MG SR tablet TAKE ONE TABLET BY MOUTH TWICE DAILY (Patient taking differently: Take 1,000 mg by mouth daily.) 180 tablet 0   REPATHA SURECLICK 846 MG/ML SOAJ INJECT 140MG  INTO THE SKIN ONCE EVERY 14 DAYS 2 mL 11   nitroGLYCERIN (NITROSTAT) 0.4 MG SL tablet Place 1 tablet (0.4 mg total) under the tongue every 5 (five) minutes as needed for chest pain. (Patient not taking: Reported on 05/06/2021) 25 tablet 3   No current facility-administered medications for this visit.    Review of Systems: No chest pain. No shortness of breath. No urinary complaints.   PHYSICAL EXAM :    Wt Readings from Last 3 Encounters:  05/06/21 214 lb 8 oz (97.3 kg)  04/27/21 217 lb (98.4 kg)  04/17/21 210 lb (95.3 kg)    BP 110/74 (BP Location: Left Arm, Patient Position: Sitting, Cuff Size: Large)   Pulse 72   Ht 5' 1.25" (1.556 m) Comment: height measured without shoes  Wt 214 lb 8 oz (97.3 kg)   BMI 40.20 kg/m  Constitutional:  Generally well appearing female in no acute distress. Psychiatric: Pleasant. Normal mood and affect. Behavior is normal. EENT: Pupils normal.  Conjunctivae are  normal. No scleral icterus. Neck supple.  Cardiovascular: Normal rate, regular rhythm. No edema Pulmonary/chest: Effort normal and breath sounds normal. No wheezing, rales or rhonchi. Abdominal: Soft, nondistended, nontender. Bowel sounds active throughout. There are no masses palpable. No hepatomegaly. Neurological: Alert and oriented to person place and time. Skin: Skin is warm and dry. No rashes noted.  Tye Savoy, NP  05/06/2021, 1:39 PM

## 2021-05-07 NOTE — Telephone Encounter (Signed)
Called patient, gave response from MD.  Patient verbalized understanding.   

## 2021-05-14 ENCOUNTER — Telehealth: Payer: Self-pay

## 2021-05-14 NOTE — Telephone Encounter (Signed)
Spoke with the patient. She agrees to this care plan. She will reach out ot [place] with any further concerns or recurrence of issues.

## 2021-05-14 NOTE — Telephone Encounter (Signed)
-----   Message from Willia Craze, NP sent at 05/08/2021  2:56 PM EDT ----- Eustaquio Maize, sometime next week can you please let patient know that Dr. Henrene Pastor reviewed case. Agrees bleeding was from acute process. Colonoscopy would be warranted if she has recurrent symptoms. Thanks

## 2021-05-20 ENCOUNTER — Other Ambulatory Visit (INDEPENDENT_AMBULATORY_CARE_PROVIDER_SITE_OTHER): Payer: Medicare Other

## 2021-05-20 DIAGNOSIS — K529 Noninfective gastroenteritis and colitis, unspecified: Secondary | ICD-10-CM | POA: Diagnosis not present

## 2021-05-20 LAB — CBC
HCT: 35.3 % — ABNORMAL LOW (ref 36.0–46.0)
Hemoglobin: 11.9 g/dL — ABNORMAL LOW (ref 12.0–15.0)
MCHC: 33.7 g/dL (ref 30.0–36.0)
MCV: 91.6 fl (ref 78.0–100.0)
Platelets: 276 10*3/uL (ref 150.0–400.0)
RBC: 3.85 Mil/uL — ABNORMAL LOW (ref 3.87–5.11)
RDW: 13.5 % (ref 11.5–15.5)
WBC: 7 10*3/uL (ref 4.0–10.5)

## 2021-05-26 ENCOUNTER — Telehealth: Payer: Self-pay | Admitting: Cardiovascular Disease

## 2021-05-26 NOTE — Telephone Encounter (Signed)
Spoke with patient of Dr. Gwenlyn Found who had labs on 10/19 (GI bleed about 1 month ago) She is concerned her Hgb is low (was 11.9) She would like to know how to get her blood counts up -- suggested she call PCP or GI doctor  Will send to MD as Juluis Rainier

## 2021-05-26 NOTE — Telephone Encounter (Signed)
Pt would like for someone to look at her blood count from her most recent hospital admission 04/17/21. She feels that her blood count is still low and she would like to know how to proceed. Please advise pt further

## 2021-07-15 ENCOUNTER — Other Ambulatory Visit: Payer: Self-pay | Admitting: Internal Medicine

## 2021-08-05 ENCOUNTER — Other Ambulatory Visit: Payer: Self-pay | Admitting: Physician Assistant

## 2021-08-21 ENCOUNTER — Telehealth: Payer: Self-pay | Admitting: Nurse Practitioner

## 2021-08-21 NOTE — Telephone Encounter (Signed)
Patient of Dr Henrene Pastor who was last seen by you 05/06/2021. She calls today about mid upper abdominal "nagging" pain. Belching helps temporarily relieve her discomfort. No changes in bowel movement, no dark or tarry stools, spells of nausea and no vomiting. She has not tried any OTC for her symptoms. No longer takes Bentyl. She takes Protonix daily and denies any missed doses. No recent ATB's or NSAIDs. States "this is very much like the last time that pain started, but not that bad yet."

## 2021-08-21 NOTE — Telephone Encounter (Signed)
Patient called said she has a flare up from her Colitis seeking advise on what she can take.

## 2021-08-24 NOTE — Telephone Encounter (Signed)
Spoke with the patient. She does not feel this is the same. She agrees with an appointment. Scheduled per her request.

## 2021-09-10 ENCOUNTER — Telehealth: Payer: Self-pay | Admitting: Internal Medicine

## 2021-09-10 NOTE — Telephone Encounter (Signed)
PA for repatha faxed to [facility] FEP with Dr. Lysbeth Penner last note, pre-treatment LDL (204) and current LDL lab work Fax: [phone]

## 2021-09-22 ENCOUNTER — Ambulatory Visit (INDEPENDENT_AMBULATORY_CARE_PROVIDER_SITE_OTHER): Payer: Medicare Other | Admitting: Internal Medicine

## 2021-09-22 ENCOUNTER — Encounter: Payer: Self-pay | Admitting: Internal Medicine

## 2021-09-22 VITALS — BP 114/80 | HR 68 | Ht 61.25 in | Wt 219.0 lb

## 2021-09-22 DIAGNOSIS — Z86010 Personal history of colonic polyps: Secondary | ICD-10-CM | POA: Diagnosis not present

## 2021-09-22 DIAGNOSIS — R935 Abnormal findings on diagnostic imaging of other abdominal regions, including retroperitoneum: Secondary | ICD-10-CM

## 2021-09-22 DIAGNOSIS — K219 Gastro-esophageal reflux disease without esophagitis: Secondary | ICD-10-CM | POA: Diagnosis not present

## 2021-09-22 DIAGNOSIS — R1013 Epigastric pain: Secondary | ICD-10-CM

## 2021-09-22 NOTE — Patient Instructions (Signed)
If you are age 66 or older, your body mass index should be between 23-30. Your Body mass index is 41.04 kg/m. If this is out of the aforementioned range listed, please consider follow up with your Primary Care Provider.  If you are age 77 or younger, your body mass index should be between 19-25. Your Body mass index is 41.04 kg/m. If this is out of the aformentioned range listed, please consider follow up with your Primary Care Provider.   ________________________________________________________  The [facility] providers would like to encourage you to use [facility] to communicate with providers for non-urgent requests or questions.  Due to long hold times on the telephone, sending your provider a message by [facility] may be a faster and more efficient way to get a response.  Please allow 48 business hours for a response.  Please remember that this is for non-urgent requests.  _______________________________________________________  Please follow up as needed

## 2021-09-22 NOTE — Progress Notes (Signed)
HISTORY OF PRESENT ILLNESS:  [name] is a 66 y.o. female with past medical history as listed below who has been followed in this office for GERD complicated by peptic stricture, intermittent fecal leakage, adenomatous colon polyps, IBS, and ischemic colitis.  I last saw the patient in June 2021.  See that dictation she was hospitalized September 2022 with segmental colitis.  Last seen in the office October 2022.  Doing well at that time.  She reached out to the office last month with new complaints of sharp epigastric discomfort exacerbated by deep breathing.  No shortness of breath.  No chest pain.  She states that this went on for about 3 weeks.  Bentyl may have been helpful.  She had continued on her PPI throughout.  She states that she stopped drinking grapefruit juice and thinks this may help.  She has had no problems for a few weeks now.  No dysphagia.  No active GERD symptoms.  No change in bowel habits.  Colorectal neoplasia surveillance is up-to-date.  Last colonoscopy 2019.  Last upper endoscopy with dilation 2019.  REVIEW OF SYSTEMS:  All non-GI ROS negative unless otherwise stated in the HPI except for anxiety  Past Medical History:  Diagnosis Date   Angina effort 05/22/2013   Anxiety    CAD (coronary artery disease), possible SCAD 12/14/2012   Colon polyps    Duodenitis    Dyspnea on exertion    LEXISCAN, 09/30/2008 - normal, EKG negative for ischemia, no ECG changes   Esophageal stricture    Gastritis    GERD (gastroesophageal reflux disease)    Gout    "only from RX given after MI" (01/22/2013)   H/O hiatal hernia    History of esophageal stricture    Hypertension    2D ECHO, 09/30/2008 - EF >55%, normal: RENAL DOPPLER, 03/21/2007 - normal duplex study   Hypothyroidism    IBS (irritable bowel syndrome)    Ischemic colitis ([place])    Lower GI bleeding    10/07/11 "I've had 3 episodes in the past year"   Myocardial infarction ([place]) 12/10/2012   "spontaneous coronary  artery dissection" (01/22/2013)   Tubular adenoma polyp of rectum 2008    Past Surgical History:  Procedure Laterality Date   ABDOMINAL HYSTERECTOMY  1999   ANTERIOR LUMBAR FUSION  2010   "L4-5" (01/22/2013)   CARDIAC CATHETERIZATION N/A 02/06/2015   Procedure: Left Heart Cath and Coronary Angiography;  Surgeon: Leonie Man, MD;  Location: [place] [facility];  Service: Cardiovascular;  Laterality: N/A;   ESOPHAGEAL DILATION  08/2010   ESOPHAGEAL DILATION     "once or twice" (01/22/2013)   HAMMER TOE SURGERY Bilateral ~ 2002   LEFT HEART CATHETERIZATION WITH CORONARY ANGIOGRAM N/A 12/11/2012   Procedure: LEFT HEART CATHETERIZATION WITH CORONARY ANGIOGRAM;  Surgeon: Leonie Man, MD;  Location: [facility] CATH LAB;  Service: Cardiovascular;  Laterality: N/A;   LEFT HEART CATHETERIZATION WITH CORONARY ANGIOGRAM N/A 12/15/2012   Procedure: LEFT HEART CATHETERIZATION WITH CORONARY ANGIOGRAM;  Surgeon: Leonie Man, MD;  Location: [facility] CATH LAB;  Service: Cardiovascular;  Laterality: N/A;   PERIPHERAL VENOUS STUDY  10/03/2008   No evidence of DVT, superficial thrombosis, or Baker's cyst   TONSILLECTOMY AND ADENOIDECTOMY  1960's   [place]  reports that she has never smoked. She has never used smokeless tobacco. She reports that she does not drink alcohol and does not use drugs.  family history includes Colon cancer (age of onset: 27) in her brother; Heart attack in her brother; Kidney disease in her mother; Multiple myeloma in her sister; Other in her brother and brother; Stroke in her father.  No Known Allergies    PHYSICAL EXAMINATION: Vital signs: BP 114/80    Pulse 68    Ht 5' 1.25" (1.556 m)    Wt 219 lb (99.3 kg)    BMI 41.04 kg/m   Constitutional: generally well-appearing, no acute distress Psychiatric: alert and oriented x3, cooperative Eyes: extraocular movements intact, anicteric, conjunctiva pink Mouth: oral pharynx moist, no  lesions Neck: supple no lymphadenopathy Cardiovascular: heart regular rate and rhythm, no murmur Lungs: clear to auscultation bilaterally Abdomen: soft, obese, nontender, nondistended, no obvious ascites, no peritoneal signs, normal bowel sounds, no organomegaly Rectal: Omitted Extremities: no clubbing, cyanosis, or lower extremity edema bilaterally Skin: no lesions on visible extremities Neuro: No focal deficits.  Cranial nerves intact  ASSESSMENT:  1.  Transient epigastric lower sternal discomfort exacerbated by deep breathing.  Most consistent with musculoskeletal etiology.  Resolved. 2.  GERD complicated by peptic stricture.  Asymptomatic post dilation on PPI 3.  History of ischemic colitis.  Resolved 4.  History of adenomatous colon polyps.  Surveillance up-to-date 5.  Obesity 6.  Multiple medical problems   PLAN:  1.  Discussion on musculoskeletal costochondral type pain 2.  Reflux precautions 3.  Weight loss 4.  Continue PPI 5.  Surveillance colonoscopy around 2024 6.  Routine office follow-up 1 year 7.  Resume general medical care with PCP

## 2021-09-23 NOTE — Telephone Encounter (Signed)
Another PA request for [place] received in [facility] Submitted 09/23/21:  (Key: [id number])

## 2021-09-24 IMAGING — MR MR [PERSON_NAME] ABD W/CM
21 of 25 series · 44 of 48 positions shown · IV contrast (10 GADAVIST)
Comparison: CT January 26, 2021.

CLINICAL DATA: Further characterization small bowel abnormality
seen on prior CT. History of abdominal pain, diaphoresis, nausea and
ischemic colitis.

EXAM:
MR ABDOMEN AND PELVIS WITHOUT AND WITH CONTRAST (MR ENTEROGRAPHY)
TECHNIQUE: Multiplanar, multisequence MRI of the abdomen and pelvis was
performed both before and during bolus administration of intravenous
contrast. Negative oral contrast VoLumen was given.
CONTRAST:  10mL GADAVIST GADOBUTROL 1 MMOL/ML IV SOLN

[Series 3: DWI · axial · 6.0mm · 1.49mm/px · z∈[-241,+134]mm · 2 of 106 slices shown (1 of 2)]
[im 1/106]
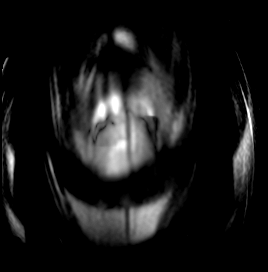
[im 106/106]
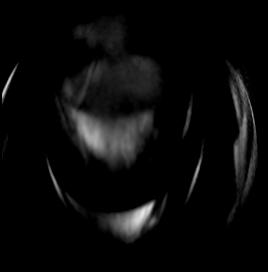

[Series 4: DWI · axial · 6.0mm · 1.49mm/px · 1 of 53 slices shown (2 of 2)]
[im 1/53]
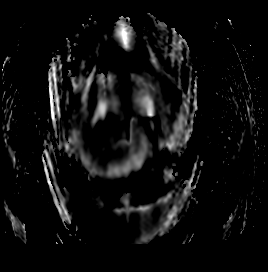

[Series 6: T2 · axial · 6.0mm · 1.56mm/px · 1 of 54 slices shown (1 of 2)]
[im 1/54]
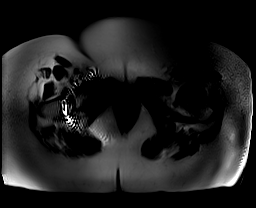

[Series 7: T2 · coronal · 6.0mm · 1.76mm/px · 1 of 51 slices shown (2 of 2)]
[im 1/51]
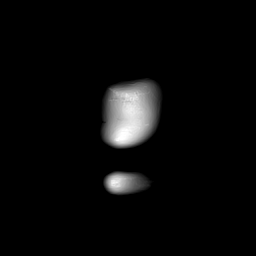

[Series 8: bSSFP · coronal · 6.0mm · 0.88mm/px · 1 of 52 slices shown]
[im 1/52]
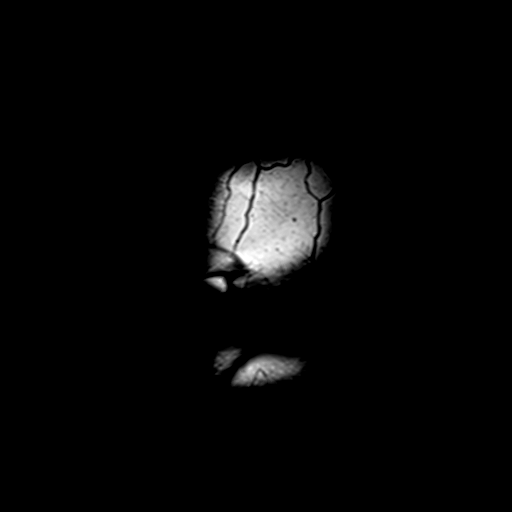

[Series 9: T1 · axial · 3.9mm · 1.25mm/px · z∈[-217,+123]mm · 2 of 88 slices shown (1 of 2)]
[im 1/88]
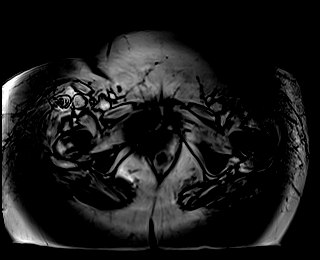
[im 88/88]
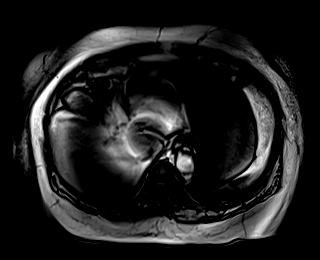

[Series 10: T1 · axial · 3.9mm · 1.25mm/px · z∈[-217,+123]mm · 3 of 88 slices shown (2 of 2)]
[im 1/88]
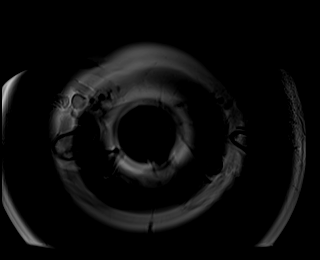
[im 44/88]
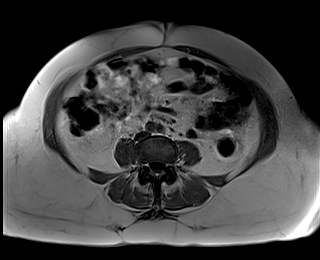
[im 88/88]
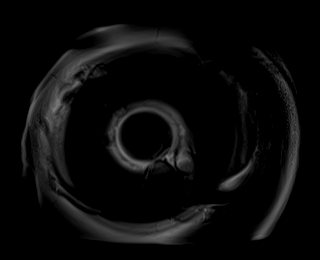

[Series 12: T1 dynamic · axial · 3.5mm · 1.25mm/px · z∈[-227,+162]mm · 3 of 112 slices shown (1 of 10)]
[im 1/112]
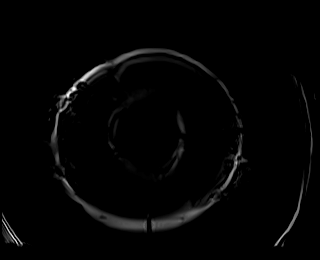
[im 56/112]
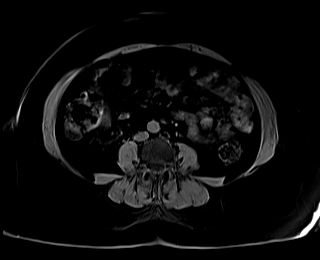
[im 112/112]
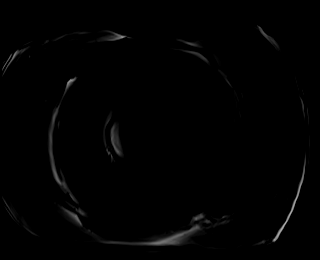

[Series 16: T1 dynamic · axial · 3.5mm · 1.25mm/px · z∈[-227,+162]mm · 3 of 112 slices shown (2 of 10)]
[im 1/112]
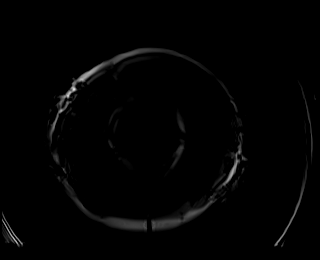
[im 56/112]
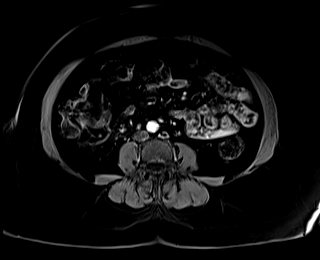
[im 112/112]
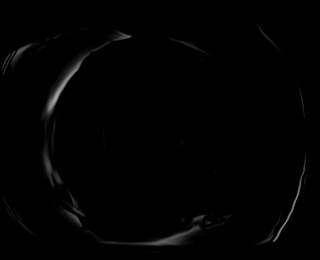

[Series 17: T1 dynamic · axial · 3.5mm · 1.25mm/px · z∈[-227,+162]mm · 3 of 112 slices shown (3 of 10)]
[im 1/112]
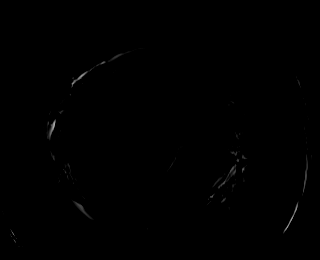
[im 56/112]
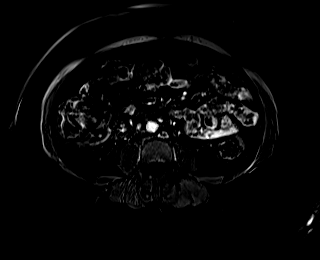
[im 112/112]
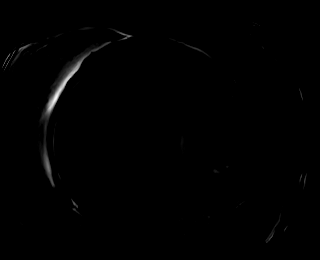

[Series 20: T1 dynamic · axial · 3.5mm · 1.25mm/px · z∈[-227,+162]mm · 3 of 112 slices shown (4 of 10)]
[im 1/112]
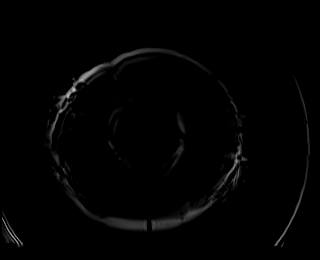
[im 56/112]
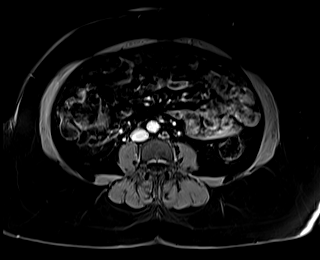
[im 112/112]
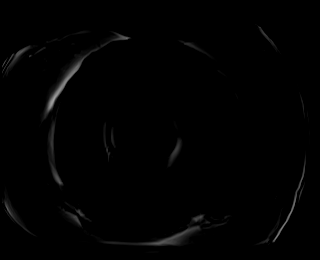

[Series 21: T1 dynamic · axial · 3.5mm · 1.25mm/px · z∈[-227,+162]mm · 3 of 112 slices shown (5 of 10)]
[im 1/112]
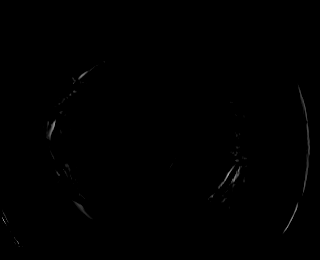
[im 56/112]
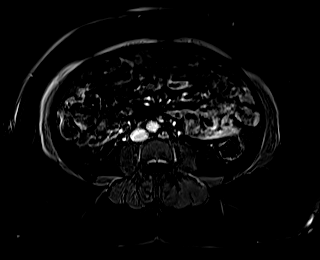
[im 112/112]
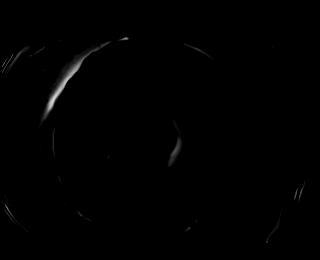

[Series 24: T1 dynamic · axial · 3.5mm · 1.25mm/px · z∈[-227,+162]mm · 3 of 112 slices shown (6 of 10)]
[im 1/112]
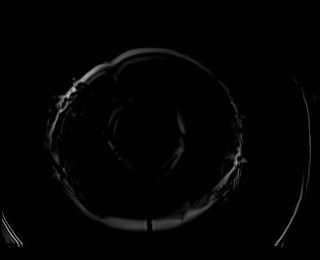
[im 56/112]
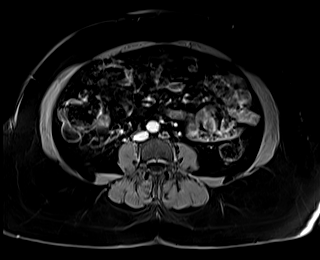
[im 112/112]
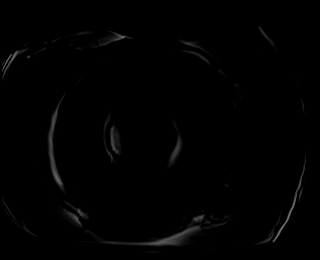

[Series 25: T1 dynamic · axial · 3.5mm · 1.25mm/px · z∈[-227,+162]mm · 3 of 112 slices shown (7 of 10)]
[im 1/112]
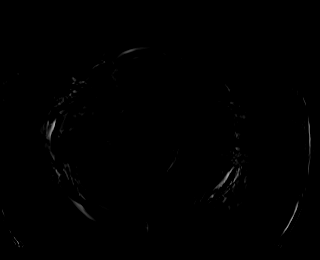
[im 56/112]
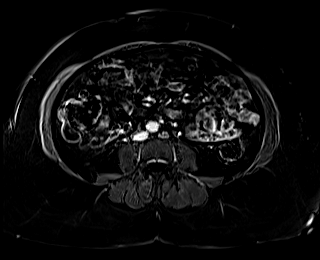
[im 112/112]
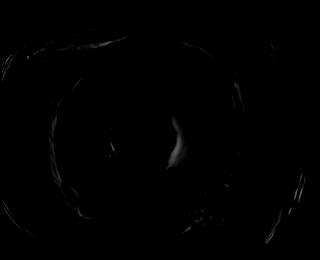

[Series 27: T1 dynamic · coronal · 5.0mm · 1.41mm/px · 2 of 60 slices shown (8 of 10)]
[im 1/60]
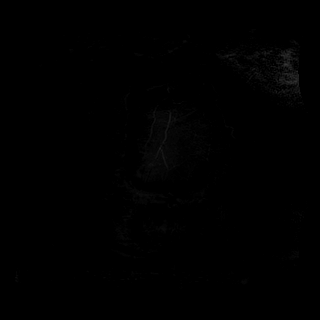
[im 60/60]
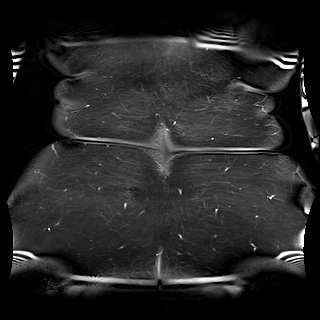

[Series 30: T1 dynamic · axial · 3.5mm · 1.25mm/px · z∈[-227,+162]mm · 3 of 112 slices shown (9 of 10)]
[im 1/112]
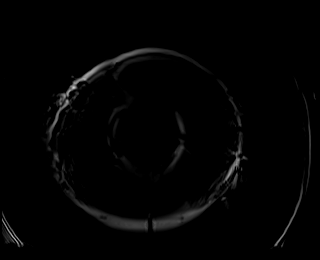
[im 56/112]
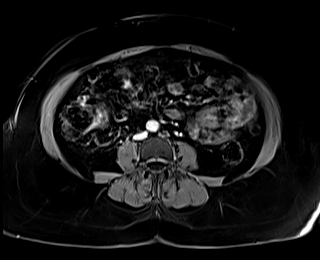
[im 112/112]
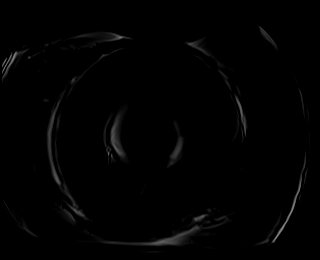

[Series 31: T1 dynamic · axial · 3.5mm · 1.25mm/px · z∈[-227,+162]mm · 3 of 112 slices shown (10 of 10)]
[im 1/112]
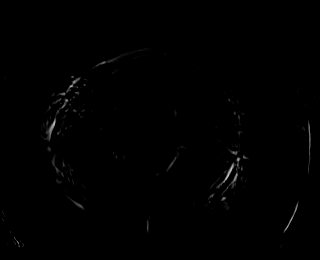
[im 56/112]
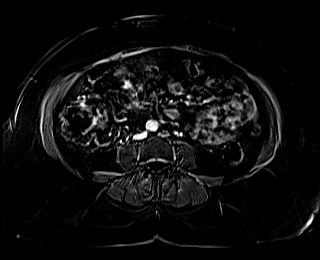
[im 112/112]
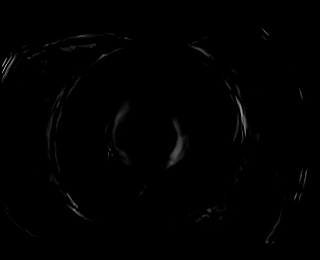

[Series 32: cor cine true-fisp · coronal · 10.0mm · 0.74mm/px · 1 of 3 slices shown (1 of 4)]
[im 1/3]
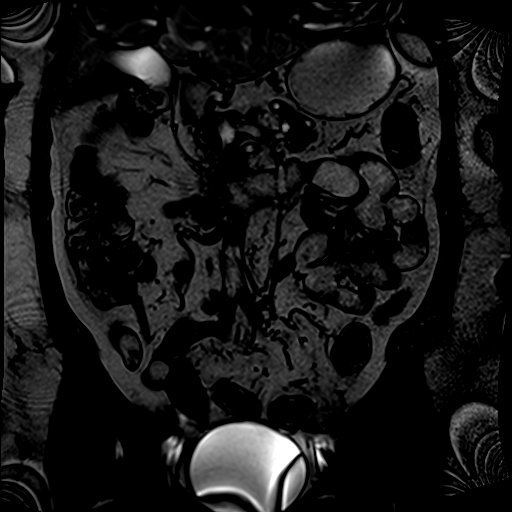

[Series 33: cor cine true-fisp · coronal · 10.0mm · 0.74mm/px · 1 of 3 slices shown (2 of 4)]
[im 1/3]
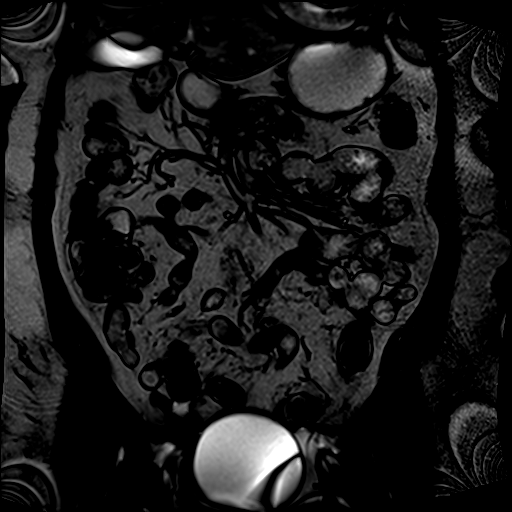

[Series 34: cor cine true-fisp · coronal · 10.0mm · 0.74mm/px · 1 of 3 slices shown (3 of 4)]
[im 1/3]
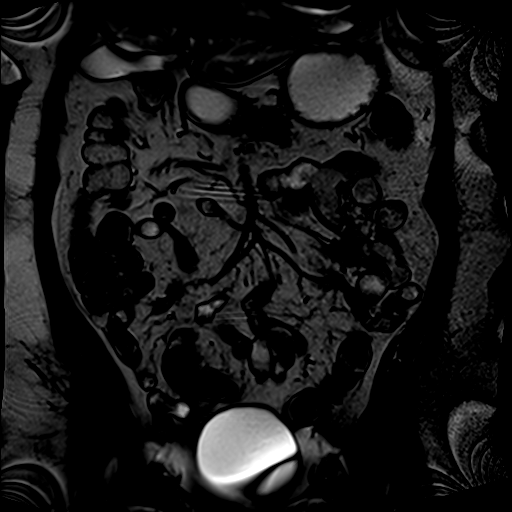

[Series 35: cor cine true-fisp · coronal · 10.0mm · 0.74mm/px · 1 of 3 slices shown (4 of 4)]
[im 1/3]
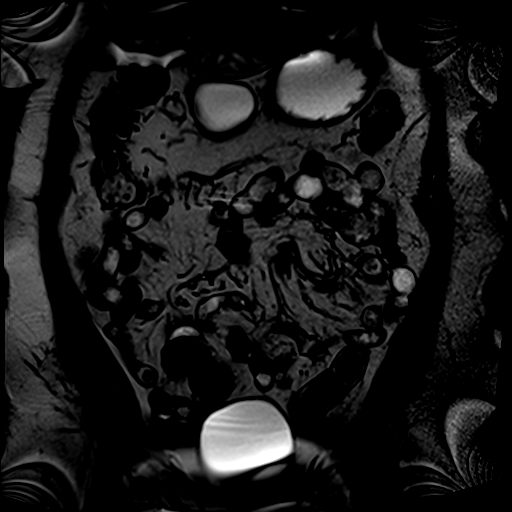

[44 of 48 positions shown; findings below may reference images not displayed]

FINDINGS: COMBINED FINDINGS FOR BOTH MR ABDOMEN AND PELVIS

Lower chest: No acute findings.

Hepatobiliary: No mass or other parenchymal abnormality identified.

Pancreas: No pancreatic ductal dilation or evidence of acute
inflammation.

Spleen:  Within normal limits.

Adrenals/Urinary Tract: No masses identified. No evidence of
hydronephrosis.

Stomach/Bowel: Stomach is distended with ingested material and gas
without evidence of wall thickening. Normal positioning of the
duodenum/ligament of [place]. No pathologic dilation of small bowel.
The previously visualized focal area of circumferential small bowel
wall thickening seen on prior study is not evident on today's
examination and may have been inflammatory or represent an area of
peristalsis. Normal appendix. Terminal ileum appears normal. No
suspicious colonic wall thickening or mass like lesions visualized.
No evidence of acute bowel inflammation.

Vascular/Lymphatic: Aortic atherosclerosis without aneurysmal
dilation.

Reproductive: Status post hysterectomy. No adnexal masses.

Other:  Trace pelvic free fluid.

Musculoskeletal: Prior anterior L5-S1 fusion. No acute osseous
abnormality.
IMPRESSION: 1. The previously visualized focal area of circumferential small
bowel wall thickening seen on prior study is not evident on today's
examination and may have represented an area of focal inflammation
or peristalsis.

2.  No acute abdominopelvic abnormality visualized.

## 2021-10-10 ENCOUNTER — Other Ambulatory Visit: Payer: Self-pay | Admitting: Cardiovascular Disease

## 2021-10-20 ENCOUNTER — Other Ambulatory Visit: Payer: Self-pay | Admitting: Cardiovascular Disease

## 2021-11-02 ENCOUNTER — Other Ambulatory Visit: Payer: Self-pay | Admitting: Cardiovascular Disease

## 2021-11-16 ENCOUNTER — Encounter: Payer: Self-pay | Admitting: Physician Assistant

## 2021-11-16 ENCOUNTER — Ambulatory Visit (INDEPENDENT_AMBULATORY_CARE_PROVIDER_SITE_OTHER): Payer: Medicare Other | Admitting: Physician Assistant

## 2021-11-16 VITALS — BP 118/84 | HR 65 | Ht 63.0 in | Wt 224.2 lb

## 2021-11-16 DIAGNOSIS — E782 Mixed hyperlipidemia: Secondary | ICD-10-CM | POA: Diagnosis not present

## 2021-11-16 DIAGNOSIS — Z01810 Encounter for preprocedural cardiovascular examination: Secondary | ICD-10-CM

## 2021-11-16 DIAGNOSIS — I2510 Atherosclerotic heart disease of native coronary artery without angina pectoris: Secondary | ICD-10-CM

## 2021-11-16 DIAGNOSIS — I10 Essential (primary) hypertension: Secondary | ICD-10-CM | POA: Diagnosis not present

## 2021-11-16 NOTE — Patient Instructions (Addendum)
Medication Instructions:  ?HOLD Plavix for 5 days prior to dental procedure and restart as soon as possible afterwards  ? ?*If you need a refill on your cardiac medications before your next appointment, please call your pharmacy* ? ?Lab Work: ?NONE ordered at this time of appointment  ? ?If you have labs (blood work) drawn today and your tests are completely normal, you will receive your results only by: ?MyChart Message (if you have MyChart) OR ?A paper copy in the mail ?If you have any lab test that is abnormal or we need to change your treatment, we will call you to review the results. ? ?Testing/Procedures: ?NONE ordered at this time of appointment  ? ?Follow-Up: ?At [facility], you and your health needs are our priority.  As part of our continuing mission to provide you with exceptional heart care, we have created designated Provider Care Teams.  These Care Teams include your primary Cardiologist (physician) and Advanced Practice Providers (APPs -  Physician Assistants and Nurse Practitioners) who all work together to provide you with the care you need, when you need it. ? ?We recommend signing up for the patient portal called "MyChart".  Sign up information is provided on this After Visit Summary.  MyChart is used to connect with patients for Virtual Visits (Telemedicine).  Patients are able to view lab/test results, encounter notes, upcoming appointments, etc.  Non-urgent messages can be sent to your provider as well.   ?To learn more about what you can do with MyChart, go to [email or web address].   ? ?Your next appointment:   ?1 year(s) ? ?The format for your next appointment:   ?In Person ? ?Provider:   ?Quay Burow, MD   ? ?Other Instructions ? ? ?Important Information About Sugar ? ? ? ? ? ? ?

## 2021-11-16 NOTE — Progress Notes (Signed)
?Cardiology Office Note:   ? ?Date:  11/18/2021  ? ?ID:  [name], DOB 04/24/1956, MRN [id number] ? ?PCP:  Darreld Mclean, MD ?  ?[facility] ?Cardiologist:  Quay Burow, MD    ? ?Referring MD: Darreld Mclean, MD  ? ?Chief Complaint  ?Patient presents with  ? Follow-up  ?  Seen for Dr. Gwenlyn Found  ? ? ?History of Present Illness:   ? ?[name] is a 66 y.o. female with a hx of HTN, HLD, obesity, hypothyroidism and CAD. Her initial cardiac catheterization in 12/10/2012 performed by Dr. Ellyn Hack demonstrated diffuse LAD disease with essentially subtotal occlusion that had the appearance of possible spontaneous coronary artery dissection, however with existing CAD, single vessel diffuse disease was also possible. It was decided not to pursue any intervention at the time but to wait and have patient returned several days later for relook cath to reevaluate the LAD. She returned to the cath lab on 12/15/2012 for relook study, she was noted to have progression of LAD disease to total occlusion at the original 95% subtotal location with improved D1 to diagonal LAD and right to left collaterals. The image was reviewed with the interventional team and concluded the best course of option was not proceeded with extensive LAD PTCA-PCI in the absence of ongoing symptom. Medical therapy was recommended. Last cardiac catheterization performed on 02/06/2015 showed total occlusion of entire mid to distal LAD, with faint collaterals from left to left and right-to-left, 90% ostial first septal lesion, 35% mid RCA, 45% distal RCA, 60% OM lesion. Medical therapy was recommended.  Echocardiogram obtained on 10/10/2019 showed EF 50 to 55%, moderate asymmetric LVH, severe hypokinesis in the mid anteroseptal wall consistent with LAD territory, normal RVEF, mild to moderate MR.   ? ?Patient was last seen by Dr. Gwenlyn Found in March 2022 at which time she remained stable.  She did have 4 episodes of spontaneously  falling down for no reason.  She denied any syncope.  Echocardiogram performed in March 2022 showed normal EF, moderate LVH, no significant valve issue.  She has been followed by Dr. Debara Pickett for management of hyperlipidemia.  Due to the dizziness, heart monitor was performed, this revealed sinus rhythm, occasional PACs and PVCs, short run of SVT.  Nothing to explain her episodic falls.  Since the last visit, she has been admitted to the hospital in September 2022 with symptomatic acute GI bleed with questionable infectious colitis.  She was treated with Augmentin. ? ?Patient presents today for 1 year follow-up.  She has not had any GI bleeding issues since last September.  Her lisinopril and Imdur were removed during the previous hospitalization, however she says she may be still taking the lisinopril 5 mg at home.  Blood pressure seems to be well controlled.  she has upcoming visit with Dr. Debara Pickett in the lipid clinic in June.  She has no significant lower extremity edema, although she does mention she has occasional leg edema.  I recommended low-salt diet and leg elevation.  She has not had any exertional symptoms recently.  She may ended up needing dental extraction.  Unfortunately, the dental extraction will involve a large molar that may has higher bleeding risk.  If needed, she is okay to hold Plavix for 5 days prior to the procedure and restart as soon as possible afterward at the surgeon's discretion.  She is taking aspirin at home which I recommend to continue through the procedure.  She does not need SBE  prophylaxis.  Otherwise she can follow-up in 1 year. ? ?Past Medical History:  ?Diagnosis Date  ? Angina effort 05/22/2013  ? Anxiety   ? CAD (coronary artery disease), possible SCAD 12/14/2012  ? Colon polyps   ? Duodenitis   ? Dyspnea on exertion   ? LEXISCAN, 09/30/2008 - normal, EKG negative for ischemia, no ECG changes  ? Esophageal stricture   ? Gastritis   ? GERD (gastroesophageal reflux disease)   ? Gout    ? "only from RX given after MI" (01/22/2013)  ? H/O hiatal hernia   ? History of esophageal stricture   ? Hypertension   ? 2D ECHO, 09/30/2008 - EF >55%, normal: RENAL DOPPLER, 03/21/2007 - normal duplex study  ? Hypothyroidism   ? IBS (irritable bowel syndrome)   ? Ischemic colitis ([place])   ? Lower GI bleeding   ? 10/07/11 "I've had 3 episodes in the past year"  ? Myocardial infarction [facility]) 12/10/2012  ? "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)  ? Tubular adenoma polyp of rectum 2008  ? ? ?Past Surgical History:  ?Procedure Laterality Date  ? ABDOMINAL HYSTERECTOMY  1999  ? ANTERIOR LUMBAR FUSION  2010  ? "L4-5" (01/22/2013)  ? CARDIAC CATHETERIZATION N/A 02/06/2015  ? Procedure: Left Heart Cath and Coronary Angiography;  Surgeon: Leonie Man, MD;  Location: [place] [facility];  Service: Cardiovascular;  Laterality: N/A;  ? ESOPHAGEAL DILATION  08/2010  ? ESOPHAGEAL DILATION    ? "once or twice" (01/22/2013)  ? HAMMER TOE SURGERY Bilateral ~ 2002  ? LEFT HEART CATHETERIZATION WITH CORONARY ANGIOGRAM N/A 12/11/2012  ? Procedure: LEFT HEART CATHETERIZATION WITH CORONARY ANGIOGRAM;  Surgeon: Leonie Man, MD;  Location: [facility] CATH LAB;  Service: Cardiovascular;  Laterality: N/A;  ? LEFT HEART CATHETERIZATION WITH CORONARY ANGIOGRAM N/A 12/15/2012  ? Procedure: LEFT HEART CATHETERIZATION WITH CORONARY ANGIOGRAM;  Surgeon: Leonie Man, MD;  Location: [facility] C[facility] LAB;  Service: Cardiovascular;  Laterality: N/A;  ? PERIPHERAL VENOUS STUDY  10/03/2008  ? No evidence of DVT, superficial thrombosis, or Baker's cyst  ? TONSILLECTOMY AND ADENOIDECTOMY  1960's  ? TUBAL LIGATION  1985  ? ? ?Current Medications: ?Current Meds  ?Medication Sig  ? ALPRAZolam (XANAX) 0.5 MG tablet TAKE 1 TABLET BY MOUTH 3 TIMES DAILY AS NEEDED FOR ANXIETY (Patient taking differently: Take 0.5 mg by mouth in the morning.)  ? amLODipine (NORVASC) 5 MG tablet TAKE ONE TABLET BY MOUTH ONE TIME DAILY  ? aspirin EC 81 MG tablet Take 81 mg by mouth  daily. Swallow whole.  ? clobetasol (TEMOVATE) 0.05 % external solution Apply topically.  ? clopidogrel (PLAVIX) 75 MG tablet Take 1 tablet by mouth once a day with breakfast  ? dicyclomine (BENTYL) 10 MG capsule Take 2 capsules (20 mg total) by mouth 4 (four) times daily -  before meals and at bedtime.  ? estradiol (ESTRACE) 0.5 MG tablet Take 0.5 mg by mouth daily.  ? lisinopril (ZESTRIL) 5 MG tablet Take 5 mg by mouth daily.  ? metoprolol tartrate (LOPRESSOR) 25 MG tablet TAKE ONE TABLET BY MOUTH TWICE DAILY  ? nitroGLYCERIN (NITROSTAT) 0.4 MG SL tablet Place 1 tablet (0.4 mg total) under the tongue every 5 (five) minutes as needed for chest pain.  ? pantoprazole (PROTONIX) 40 MG tablet TAKE ONE TABLET BY MOUTH ONE TIME DAILY  ? pimecrolimus (ELIDEL) 1 % cream SMARTSIG:1 Topical Daily PRN  ? ranolazine (RANEXA) 1000 MG SR tablet TAKE ONE TABLET BY MOUTH TWICE  DAILY  ? REPATHA SURECLICK 149 MG/ML SOAJ INJECT 140MG INTO THE SKIN ONCE EVERY 14 DAYS  ?  ? ?Allergies:   Patient has no known allergies.  ? ?Social History  ? ?Socioeconomic History  ? Marital status: Single  ?  Spouse name: Not on file  ? Number of children: 2  ? Years of education: Not on file  ? Highest education level: Not on file  ?Occupational History  ? Occupation: Drug enforcement  ?  Comment: Surveyor, minerals  ?Tobacco Use  ? Smoking status: Never  ? Smokeless tobacco: Never  ?Vaping Use  ? Vaping Use: Never used  ?Substance and Sexual Activity  ? Alcohol use: No  ? Drug use: No  ? Sexual activity: Not Currently  ?Other Topics Concern  ? Not on file  ?Social History Narrative  ? Daily caffeine use.  ? ?Social Determinants of Health  ? ?Financial Resource Strain: Not on file  ?Food Insecurity: Not on file  ?Transportation Needs: Not on file  ?Physical Activity: Not on file  ?Stress: Not on file  ?Social Connections: Not on file  ?  ? ?Family History: ?The patient's family history includes Colon cancer (age of onset: 71) in her brother; Heart  attack in her brother; Kidney disease in her mother; Multiple myeloma in her sister; Other in her brother and brother; Stroke in her father. ? ?ROS:   ?Please see the history of present illness.    ? All ot

## 2021-11-18 ENCOUNTER — Encounter: Payer: Self-pay | Admitting: Physician Assistant

## 2021-11-25 ENCOUNTER — Other Ambulatory Visit: Payer: Self-pay | Admitting: Internal Medicine

## 2021-11-25 ENCOUNTER — Other Ambulatory Visit: Payer: Self-pay | Admitting: Physician Assistant

## 2021-12-14 ENCOUNTER — Telehealth: Payer: Self-pay | Admitting: Physician Assistant

## 2021-12-14 NOTE — Telephone Encounter (Signed)
Pt c/o swelling: STAT is pt has developed SOB within 24 hours ? ?If swelling, where is the swelling located? feet and legs ? ?How much weight have you gained and in what time span?  None that she can tell ? ?Have you gained 3 pounds in a day or 5 pounds in a week?  ? ?Do you have a log of your daily weights (if so, list)?  ? ?Are you currently taking a fluid pill? No- that is why patient is calling. Patient would lie be put on fluid pill ? ?Are you currently SOB?  no ? ?Have you traveled recently? no ?

## 2021-12-14 NOTE — Telephone Encounter (Signed)
Returned call to patient, patient reports issues with ongoing LE edema.   She states she discussed this with Almyra Deforest PA at her last appt and he recommended low sodium diet and elevation.   She reports she has been trying these things and it doesn't seem to be helping/resolving.   Reports swelling improves overnight but increases throughout the day.  She does not weigh daily, unsure what weight is.   She is traveling the first week of June and concerned about ongoing swelling.  She states she use to take a diuretic but does not currently take one.  Denies SOB.   Has compression stockings, uses these when traveling.   ? ?Advised would send message to Almyra Deforest PA to review.   ?

## 2021-12-15 NOTE — Telephone Encounter (Signed)
Patient is calling due to not hearing back in regards to this. Requesting a callback today.  ?

## 2021-12-15 NOTE — Telephone Encounter (Signed)
Spoke with pt to let her know that [name] has not sent [place] a message back. It looks like he was out of the office today but will return tomorrow. Told pt that when we hear back from [place] someone from Triage will give her a call. Pt verbalizes understanding.  ?

## 2021-12-17 ENCOUNTER — Telehealth: Payer: Self-pay | Admitting: Cardiovascular Disease

## 2021-12-17 NOTE — Telephone Encounter (Signed)
Pt states that she has not heard anything back  regarding a medication being called in for the swelling in her legs and feet. Please advise

## 2021-12-17 NOTE — Telephone Encounter (Signed)
Contacted patient, advised that [name] has been out of office this week, however I would send a message over to [facility] to review.   Patient states when she seen [name] in April they discussed the swelling, he advised her to try some things at home to see if it improved. She has been doing those suggestions but no change in the leg swelling. She states they discussed possibly starting a low dose fluid pill. She is going to be going out of town in 2 weeks, and would like to try this option to see if it makes a difference before she travels. I advised I would send to Dr.Berry to see what he would recommend.  Thanks!

## 2021-12-17 NOTE — Telephone Encounter (Signed)
Pt called back in regards to this issue. Asked to have a call back today.

## 2021-12-17 NOTE — Telephone Encounter (Signed)
Patient returning call in regards to swelling, please advise

## 2021-12-18 ENCOUNTER — Other Ambulatory Visit: Payer: Self-pay

## 2021-12-18 DIAGNOSIS — R609 Edema, unspecified: Secondary | ICD-10-CM

## 2021-12-18 MED ORDER — FUROSEMIDE 20 MG PO TABS: 20.0000 mg | ORAL_TABLET | Freq: Every day | ORAL | 3 refills | Status: DC

## 2021-12-18 NOTE — Telephone Encounter (Signed)
I contacted patient, advised of note from MD.  I did schedule patient for a ROV with Almyra Deforest, [name] next week (possible to do blood work at this visit)   I will call in Lasix 20 mg to pharmacy. (Patient is leaving for vacation, and I suggested she be seen before leaving)  Thanks!

## 2021-12-18 NOTE — Telephone Encounter (Signed)
Attempted to contact patient, unable to reach.  Left message to call back.

## 2021-12-18 NOTE — Telephone Encounter (Signed)
Patient returning call.

## 2021-12-18 NOTE — Telephone Encounter (Signed)
See previous note.  Handled. Thanks!

## 2021-12-23 ENCOUNTER — Other Ambulatory Visit: Payer: Self-pay | Admitting: Cardiovascular Disease

## 2021-12-25 ENCOUNTER — Other Ambulatory Visit: Payer: Self-pay

## 2021-12-25 ENCOUNTER — Ambulatory Visit (INDEPENDENT_AMBULATORY_CARE_PROVIDER_SITE_OTHER): Payer: Medicare Other | Admitting: Physician Assistant

## 2021-12-25 ENCOUNTER — Encounter: Payer: Self-pay | Admitting: Physician Assistant

## 2021-12-25 VITALS — BP 112/72 | HR 66 | Ht 63.0 in | Wt 223.6 lb

## 2021-12-25 DIAGNOSIS — I2510 Atherosclerotic heart disease of native coronary artery without angina pectoris: Secondary | ICD-10-CM | POA: Diagnosis not present

## 2021-12-25 DIAGNOSIS — E785 Hyperlipidemia, unspecified: Secondary | ICD-10-CM | POA: Diagnosis not present

## 2021-12-25 DIAGNOSIS — R600 Localized edema: Secondary | ICD-10-CM

## 2021-12-25 DIAGNOSIS — I10 Essential (primary) hypertension: Secondary | ICD-10-CM | POA: Diagnosis not present

## 2021-12-25 DIAGNOSIS — R609 Edema, unspecified: Secondary | ICD-10-CM

## 2021-12-25 NOTE — Progress Notes (Unsigned)
Cardiology Office Note:    Date:  12/26/2021   ID:  [name], DOB 1956-04-28, MRN [id number]  PCP:  Darreld Mclean, MD   [facility] Providers Cardiologist:  Quay Burow, MD     Referring MD: Darreld Mclean, MD   Chief Complaint  Patient presents with   Follow-up   Leg Swelling    Seen for Dr. Gwenlyn Found    History of Present Illness:    [name] is a 66 y.o. female with a hx of HTN, HLD, obesity, hypothyroidism and CAD. Her initial cardiac catheterization in 12/10/2012 performed by Dr. Ellyn Hack demonstrated diffuse LAD disease with essentially subtotal occlusion that had the appearance of possible spontaneous coronary artery dissection, however with existing CAD, single vessel diffuse disease was also possible. It was decided not to pursue any intervention at the time but to wait and have patient returned several days later for relook cath to reevaluate the LAD. She returned to the cath lab on 12/15/2012 for relook study, she was noted to have progression of LAD disease to total occlusion at the original 95% subtotal location with improved D1 to diagonal LAD and right to left collaterals. The image was reviewed with the interventional team and concluded the best course of option was not proceeded with extensive LAD PTCA-PCI in the absence of ongoing symptom. Medical therapy was recommended. Last cardiac catheterization performed on 02/06/2015 showed total occlusion of entire mid to distal LAD, with faint collaterals from left to left and right-to-left, 90% ostial first septal lesion, 35% mid RCA, 45% distal RCA, 60% OM lesion. Medical therapy was recommended.  Echocardiogram obtained on 10/10/2019 showed EF 50 to 55%, moderate asymmetric LVH, severe hypokinesis in the mid anteroseptal wall consistent with LAD territory, normal RVEF, mild to moderate MR.     Patient was last seen by Dr. Gwenlyn Found in March 2022 at which time she remained stable.  She did have 4 episodes of  spontaneously falling down for no reason.  She denied any syncope.  Echocardiogram performed in March 2022 showed normal EF, moderate LVH, no significant valve issue.  She has been followed by Dr. Debara Pickett for management of hyperlipidemia.  Due to the dizziness, heart monitor was performed, this revealed sinus rhythm, occasional PACs and PVCs, short run of SVT.  Nothing to explain her episodic falls.  Since the last visit, she has been admitted to the hospital in September 2022 with symptomatic acute GI bleed with questionable infectious colitis.  She was treated with Augmentin.  I last saw the patient in April 2023 for 1 year follow-up.  She has not had any GI bleeding issue.  Her lisinopril and Imdur were removed during the previous hospitalization however she says she may be still taking the lisinopril 5 mg at home.  Blood pressure was well controlled.  She had no significant lower extremity edema at that time although she mentioned she has occasional leg edema.  I recommended low-salt diet and leg elevation.  At the recommendation of Dr. Alvester Chou, she was started on 20 mg every day of Lasix.  Patient presents today for follow-up, her lower extremity edema has completely resolved.  I suspect she has venous stasis as she is mentions her edema is worse by the end of the day and better after overnight sleep.  I previously instructed the patient to keep a low-salt diet and to do leg elevation as needed for leg edema, she says she has been doing that.  She also has  a compression stocking at home as well.  She had a basic metabolic panel today prior to the visit, I will follow-up on the result of the basic metabolic panel.  If renal function is down, I will have low threshold of changing the Lasix to as needed.  Otherwise, patient can follow-up in 9 month.  Past Medical History:  Diagnosis Date   Angina effort 05/22/2013   Anxiety    CAD (coronary artery disease), possible SCAD 12/14/2012   Colon polyps     Duodenitis    Dyspnea on exertion    LEXISCAN, 09/30/2008 - normal, EKG negative for ischemia, no ECG changes   Esophageal stricture    Gastritis    GERD (gastroesophageal reflux disease)    Gout    "only from RX given after MI" (01/22/2013)   H/O hiatal hernia    History of esophageal stricture    Hypertension    2D ECHO, 09/30/2008 - EF >55%, normal: RENAL DOPPLER, 03/21/2007 - normal duplex study   Hypothyroidism    IBS (irritable bowel syndrome)    Ischemic colitis ([place])    Lower GI bleeding    10/07/11 "I've had 3 episodes in the past year"   Myocardial infarction ([place]) 12/10/2012   "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)   Tubular adenoma polyp of rectum 2008    Past Surgical History:  Procedure Laterality Date   ABDOMINAL HYSTERECTOMY  1999   ANTERIOR LUMBAR FUSION  2010   "L4-5" (01/22/2013)   CARDIAC CATHETERIZATION N/A 02/06/2015   Procedure: Left Heart Cath and Coronary Angiography;  Surgeon: Leonie Man, MD;  Location: [facility];  Service: Cardiovascular;  Laterality: N/A;   ESOPHAGEAL DILATION  08/2010   ESOPHAGEAL DILATION     "once or twice" (01/22/2013)   HAMMER TOE SURGERY Bilateral ~ 2002   LEFT HEART CATHETERIZATION WITH CORONARY ANGIOGRAM N/A 12/11/2012   Procedure: LEFT HEART CATHETERIZATION WITH CORONARY ANGIOGRAM;  Surgeon: Leonie Man, MD;  Location: [facility];  Service: Cardiovascular;  Laterality: N/A;   LEFT HEART CATHETERIZATION WITH CORONARY ANGIOGRAM N/A 12/15/2012   Procedure: LEFT HEART CATHETERIZATION WITH CORONARY ANGIOGRAM;  Surgeon: Leonie Man, MD;  Location: [facility] C[facility] LAB;  Service: Cardiovascular;  Laterality: N/A;   PERIPHERAL VENOUS STUDY  10/03/2008   No evidence of DVT, superficial thrombosis, or Baker's cyst   TONSILLECTOMY AND ADENOIDECTOMY  1960's   TUBAL LIGATION  1985    Current Medications: Current Meds  Medication Sig   ALPRAZolam (XANAX) 0.5 MG tablet TAKE 1 TABLET BY MOUTH 3 TIMES DAILY AS NEEDED  FOR ANXIETY (Patient taking differently: Take 0.5 mg by mouth in the morning.)   amLODipine (NORVASC) 5 MG tablet TAKE ONE TABLET BY MOUTH ONE TIME DAILY   aspirin EC 81 MG tablet Take 81 mg by mouth daily. Swallow whole.   clobetasol (TEMOVATE) 0.05 % external solution Apply topically.   clopidogrel (PLAVIX) 75 MG tablet Take 1 tablet by mouth once a day with breakfast   dicyclomine (BENTYL) 10 MG capsule Take 2 capsules (20 mg total) by mouth 4 (four) times daily -  before meals and at bedtime.   estradiol (ESTRACE) 0.5 MG tablet Take 0.5 mg by mouth daily.   furosemide (LASIX) 20 MG tablet Take 1 tablet (20 mg total) by mouth daily.   lisinopril (ZESTRIL) 5 MG tablet TAKE ONE TABLET BY MOUTH ONE TIME DAILY   metoprolol tartrate (LOPRESSOR) 25 MG tablet TAKE ONE TABLET BY MOUTH TWICE DAILY  nitroGLYCERIN (NITROSTAT) 0.4 MG SL tablet DISSOLVE ONE TABLET UNDER TONGUE EVERY 5 MINUTES AS NEEDED FOR CHEST PAIN   pantoprazole (PROTONIX) 40 MG tablet TAKE ONE TABLET BY MOUTH ONE TIME DAILY   pimecrolimus (ELIDEL) 1 % cream SMARTSIG:1 Topical Daily PRN   ranolazine (RANEXA) 1000 MG SR tablet TAKE ONE TABLET BY MOUTH TWICE DAILY   REPATHA SURECLICK 889 MG/ML SOAJ inject 164m into the skin once every 14 days     Allergies:   Patient has no known allergies.   Social History   Socioeconomic History   Marital status: Single    Spouse name: Not on file   Number of children: 2   Years of education: Not on file   Highest education level: Not on file  Occupational History   Occupation: Drug enforcement    Comment: [employer]or[employer] Tobacco Use   Smoking status: Never   Smokeless tobacco: Never  Vaping Use   Vaping Use: Never used  Substance and Sexual Activity   Alcohol use: No   Drug use: No   Sexual activity: Not Currently  Other Topics Concern   Not on file  Social History Narrative   Daily caffeine use.   Social Determinants of Health   Financial Resource Strain: Not on file   Food Insecurity: Not on file  Transportation Needs: Not on file  Physical Activity: Not on file  Stress: Not on file  Social Connections: Not on file     Family History: The patient's family history includes Colon cancer (age of onset: 484 in her brother; Heart attack in her brother; Kidney disease in her mother; Multiple myeloma in her sister; Other in her brother and brother; Stroke in her father.  ROS:   Please see the history of present illness.     All other systems reviewed and are negative.  EKGs/Labs/Other Studies Reviewed:    The following studies were reviewed today:  Echo 10/13/2020 1. Left ventricular ejection fraction, by estimation, is 55 to 60%. The  left ventricle has normal function. The left ventricle demonstrates  regional wall motion abnormalities (see scoring diagram/findings for  description). There is hypokinesis of the  mid anteroseptal wall most notable on the short axis view. There is  moderate concentric left ventricular hypertrophy. Left ventricular  diastolic parameters are consistent with Grade I diastolic dysfunction  (impaired relaxation).   2. Right ventricular systolic function is normal. The right ventricular  size is normal. There is normal pulmonary artery systolic pressure. The  estimated right ventricular systolic pressure is 216.9mmHg.   3. Left atrial size was mildly dilated.   4. The mitral valve is grossly normal. Mild mitral valve regurgitation.   5. The aortic valve is tricuspid. There is mild calcification of the  aortic valve. There is mild thickening of the aortic valve. Aortic valve  regurgitation is not visualized. Mild to moderate aortic valve  sclerosis/calcification is present, without any  evidence of aortic stenosis.   6. Aortic dilatation noted. There is mild dilatation of the ascending  aorta, measuring 36 mm.   7. The inferior vena cava is normal in size with greater than 50%  respiratory variability, suggesting right  atrial pressure of 3 mmHg.   Comparison(s): Compared to prior study in 10/2019, the LVEF appears normal  55-60% and the MR appears mild.   EKG:  EKG is not ordered today.   Recent Labs: 12/31/2020: TSH 3.45 04/17/2021: ALT 13 05/20/2021: Hemoglobin 11.9; Platelets 276.0 12/25/2021: BUN 26; Creatinine,  Ser 1.24; Potassium 4.6; Sodium 134  Recent Lipid Panel    Component Value Date/Time   CHOL 177 04/02/2021 0910   TRIG 161 (H) 04/02/2021 0910   HDL 54 04/02/2021 0910   CHOLHDL 3.3 04/02/2021 0910   CHOLHDL 3 03/22/2019 0839   VLDL 35.8 03/22/2019 0839   LDLCALC 95 04/02/2021 0910   LDLDIRECT 128.0 01/12/2017 1455     Risk Assessment/Calculations:           Physical Exam:    VS:  BP 112/72   Pulse 66   Ht 5' 3"  (1.6 m)   Wt 223 lb 9.6 oz (101.4 kg)   SpO2 95%   BMI 39.61 kg/m     Wt Readings from Last 3 Encounters:  12/25/21 223 lb 9.6 oz (101.4 kg)  11/16/21 224 lb 3.2 oz (101.7 kg)  09/22/21 219 lb (99.3 kg)     GEN:  Well nourished, well developed in no acute distress HEENT: Normal NECK: No JVD; No carotid bruits LYMPHATICS: No lymphadenopathy CARDIAC: RRR, no murmurs, rubs, gallops RESPIRATORY:  Clear to auscultation without rales, wheezing or rhonchi  ABDOMEN: Soft, non-tender, non-distended MUSCULOSKELETAL:  No edema; No deformity  SKIN: Warm and dry NEUROLOGIC:  Alert and oriented x 3 PSYCHIATRIC:  Normal affect   ASSESSMENT:    1. Leg edema   2. Coronary artery disease involving native coronary artery of native heart without angina pectoris   3. Essential hypertension   4. Hyperlipidemia LDL goal <70    PLAN:    In order of problems listed above:  Leg edema: Her leg edema is worse at the end of the day and better after overnight rest.  During the previous visit, she also mentioned intermittent leg edema to me, at the time I instructed her to cut back on salt and do leg elevation.  When she called [place] a few days ago complaining of leg edema, she  was placed on 20 mg daily of oral Lasix.  Since starting on the Lasix, her leg edema quickly resolved.  I still suspect that she has venous stasis rather than heart failure.  She does have compression stockings at home.  She had a basic metabolic panel prior to today's visit, if creatinine is up, I would have low threshold of changing the Lasix to as needed as I do not think the patient actually need daily dosing of Lasix  CAD: Denies any chest pain.  Aspirin and Plavix  Hypertension: Blood pressure stable  Hyperlipidemia: On Repatha.           Medication Adjustments/Labs and Tests Ordered: Current medicines are reviewed at length with the patient today.  Concerns regarding medicines are outlined above.  No orders of the defined types were placed in this encounter.  No orders of the defined types were placed in this encounter.   Patient Instructions  Medication Instructions:  Your physician recommends that you continue on your current medications as directed. Please refer to the Current Medication list given to you today.  *If you need a refill on your cardiac medications before your next appointment, please call your pharmacy*  Lab Work: NONE ordered at this time of appointment   If you have labs (blood work) drawn today and your tests are completely normal, you will receive your results only by: [place] (if you have MyChart) OR A paper copy in the mail If you have any lab test that is abnormal or we need to change your treatment, we will  call you to review the results.  Testing/Procedures: NONE ordered at this time of appointment    Follow-Up: At [facility], you and your health needs are our priority.  As part of our continuing mission to provide you with exceptional heart care, we have created designated Provider Care Teams.  These Care Teams include your primary Cardiologist (physician) and Advanced Practice Providers (APPs -  Physician Assistants and Nurse  Practitioners) who all work together to provide you with the care you need, when you need it.  We recommend signing up for the patient portal called "MyChart".  Sign up information is provided on this After Visit Summary.  MyChart is used to connect with patients for Virtual Visits (Telemedicine).  Patients are able to view lab/test results, encounter notes, upcoming appointments, etc.  Non-urgent messages can be sent to your provider as well.   To learn more about what you can do with MyChart, go to [email or web address].    Your next appointment:   9 month(s)  The format for your next appointment:   In Person  Provider:   Quay Burow, MD     Other Instructions   Important Information About Sugar         Hilbert Corrigan, [place]  12/26/2021 11:05 PM    [place]

## 2021-12-25 NOTE — Patient Instructions (Signed)
Medication Instructions:  Your physician recommends that you continue on your current medications as directed. Please refer to the Current Medication list given to you today.  *If you need a refill on your cardiac medications before your next appointment, please call your pharmacy*  Lab Work: NONE ordered at this time of appointment   If you have labs (blood work) drawn today and your tests are completely normal, you will receive your results only by: [place] (if you have MyChart) OR A paper copy in the mail If you have any lab test that is abnormal or we need to change your treatment, we will call you to review the results.  Testing/Procedures: NONE ordered at this time of appointment    Follow-Up: At [facility], you and your health needs are our priority.  As part of our continuing mission to provide you with exceptional heart care, we have created designated Provider Care Teams.  These Care Teams include your primary Cardiologist (physician) and Advanced Practice Providers (APPs -  Physician Assistants and Nurse Practitioners) who all work together to provide you with the care you need, when you need it.  We recommend signing up for the patient portal called "MyChart".  Sign up information is provided on this After Visit Summary.  MyChart is used to connect with patients for Virtual Visits (Telemedicine).  Patients are able to view lab/test results, encounter notes, upcoming appointments, etc.  Non-urgent messages can be sent to your provider as well.   To learn more about what you can do with MyChart, go to [email or web address].    Your next appointment:   9 month(s)  The format for your next appointment:   In Person  Provider:   Quay Burow, MD     Other Instructions   Important Information About Sugar

## 2021-12-26 ENCOUNTER — Encounter: Payer: Self-pay | Admitting: Physician Assistant

## 2021-12-26 LAB — BASIC METABOLIC PANEL
BUN/Creatinine Ratio: 21 (ref 12–28)
BUN: 26 mg/dL (ref 8–27)
CO2: 23 mmol/L (ref 20–29)
Calcium: 9.6 mg/dL (ref 8.7–10.3)
Chloride: 96 mmol/L (ref 96–106)
Creatinine, Ser: 1.24 mg/dL — ABNORMAL HIGH (ref 0.57–1.00)
Glucose: 114 mg/dL — ABNORMAL HIGH (ref 70–99)
Potassium: 4.6 mmol/L (ref 3.5–5.2)
Sodium: 134 mmol/L (ref 134–144)
eGFR: 48 mL/min/{1.73_m2} — ABNORMAL LOW (ref >59)

## 2021-12-29 ENCOUNTER — Other Ambulatory Visit: Payer: Self-pay | Admitting: Cardiovascular Disease

## 2022-01-19 ENCOUNTER — Other Ambulatory Visit: Payer: Self-pay | Admitting: Internal Medicine

## 2022-01-19 DIAGNOSIS — E785 Hyperlipidemia, unspecified: Secondary | ICD-10-CM

## 2022-01-21 ENCOUNTER — Telehealth: Payer: Self-pay | Admitting: Family Medicine

## 2022-01-21 NOTE — Telephone Encounter (Signed)
Pt stated that her dry cough has returned when she lays down and cannot remember what Dr. Lorelei Pont recommenced she take for it. Pt would like a call back to go over this when possible.

## 2022-01-22 NOTE — Telephone Encounter (Signed)
Called her- she states she feels great except for a dry cough at night only Suggested delsym, may also have some allergies so claritin/ zyrtec might help If not getting better next week she will come in and see me.  Declines to have me schedule a visit for now

## 2022-01-22 NOTE — Telephone Encounter (Signed)
Tried looking back and I could not find anything on this. Pt is aware that you are not in office, advised to go to UC if cough worsens.   CB number: [phone]

## 2022-01-26 LAB — LIPID PANEL
Chol/HDL Ratio: 3.1 ratio (ref 0.0–4.4)
Cholesterol, Total: 194 mg/dL (ref 100–199)
HDL: 63 mg/dL (ref >39)
LDL Chol Calc (NIH): 98 mg/dL (ref 0–99)
Triglycerides: 196 mg/dL — ABNORMAL HIGH (ref 0–149)
VLDL Cholesterol Cal: 33 mg/dL (ref 5–40)

## 2022-01-29 ENCOUNTER — Ambulatory Visit (HOSPITAL_BASED_OUTPATIENT_CLINIC_OR_DEPARTMENT_OTHER): Payer: Medicare Other | Admitting: Internal Medicine

## 2022-01-29 ENCOUNTER — Ambulatory Visit (INDEPENDENT_AMBULATORY_CARE_PROVIDER_SITE_OTHER): Payer: Medicare Other | Admitting: Internal Medicine

## 2022-01-29 ENCOUNTER — Encounter (HOSPITAL_BASED_OUTPATIENT_CLINIC_OR_DEPARTMENT_OTHER): Payer: Self-pay | Admitting: Internal Medicine

## 2022-01-29 VITALS — BP 110/76 | HR 60 | Ht 63.0 in | Wt 225.5 lb

## 2022-01-29 DIAGNOSIS — T466X5A Adverse effect of antihyperlipidemic and antiarteriosclerotic drugs, initial encounter: Secondary | ICD-10-CM

## 2022-01-29 DIAGNOSIS — I2510 Atherosclerotic heart disease of native coronary artery without angina pectoris: Secondary | ICD-10-CM

## 2022-01-29 DIAGNOSIS — M791 Myalgia, unspecified site: Secondary | ICD-10-CM | POA: Diagnosis not present

## 2022-01-29 DIAGNOSIS — T466X5D Adverse effect of antihyperlipidemic and antiarteriosclerotic drugs, subsequent encounter: Secondary | ICD-10-CM

## 2022-01-29 DIAGNOSIS — E785 Hyperlipidemia, unspecified: Secondary | ICD-10-CM | POA: Diagnosis not present

## 2022-01-29 DIAGNOSIS — M109 Gout, unspecified: Secondary | ICD-10-CM | POA: Diagnosis not present

## 2022-01-29 MED ORDER — EZETIMIBE 10 MG PO TABS: 10.0000 mg | ORAL_TABLET | Freq: Every day | ORAL | 3 refills | Status: DC

## 2022-01-29 NOTE — Progress Notes (Signed)
LIPID CLINIC CONSULT NOTE  Chief Complaint:  Follow-up dyslipidemia  Primary Care Physician: Darreld Mclean, [name]  Primary Cardiologist:  Quay Burow, [name]  HPI:  [name] is a 66 y.o. female who is being seen today for the evaluation of dyslipidemia at the request of Copland, Gay Filler, [name].  This is a pleasant 66 year old female patient of Dr. Alvester Chou with a history of coronary artery disease and occluded LAD as well as mitral valvular disease.  She has had persistent dyslipidemia and unfortunately statin intolerance.  Most recently her lipids show total cholesterol 236, triglycerides 150, HDL 72 and LDL 138.  Her target LDL is less than 70.  She was approved for Repatha and took it for a brief period of time however was getting it via mail order which was cumbersome and the cost was about $70 a month which was not affordable for her.  Ultimately she discontinued it.  She is here today to discuss additional options.  I do think that a PCSK9 inhibitor is the best option for her.  Other options could be ezetimibe, Nexletol or other therapies however cardiovascular risk reduction is not a significant or unknown.  02/12/2020  [name] returns today for follow-up.  She seems to be tolerating Repatha with a nice improvement in her lipids.  Her total cholesterol is decreased from 236-149, triglycerides have gone up slightly to 170 with a decrease in LDL cholesterol from 138-60.  10/10/2020  [name] returns today for follow-up of dyslipidemia.  She had labs drawn last month and has much improved cholesterol although not quite to goal.  Her total cholesterol is 175, HDL 64 LDL 80 and triglycerides 187.  Her target LDL is less than 70.  01/29/2022  [name] returns today for follow-up.  We have been planning a 78-monthaggressive diet and exercise routine to try to lower cholesterol further.  Unfortunately her cholesterol is worse.  Total cholesterol now 194,  triglycerides 196, HDL 63 and LDL 98 (up from 95).  I discussed that she remains well above her target LDL less than 70.  She also is complaining of some left great toe pain.  She apparently had had gout in her right toe in the past and was concerned about this  PMHx:  Past Medical History:  Diagnosis Date   Angina effort 05/22/2013   Anxiety    CAD (coronary artery disease), possible SCAD 12/14/2012   Colon polyps    Duodenitis    Dyspnea on exertion    LEXISCAN, 09/30/2008 - normal, EKG negative for ischemia, no ECG changes   Esophageal stricture    Gastritis    GERD (gastroesophageal reflux disease)    Gout    "only from RX given after MI" (01/22/2013)   H/O hiatal hernia    History of esophageal stricture    Hypertension    2D ECHO, 09/30/2008 - EF >55%, normal: RENAL DOPPLER, 03/21/2007 - normal duplex study   Hypothyroidism    IBS (irritable bowel syndrome)    [place]    Lower GI bleeding    10/07/11 "I've had 3 episodes in the past year"   [place] [place]   "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)   Tubular adenoma polyp of rectum 2008    Past Surgical History:  Procedure Laterality Date   ABDOMINAL HYSTERECTOMY  1999   ANTERIOR LUMBAR FUSION  2010   "L4-5" (01/22/2013)   CARDIAC CATHETERIZATION N/A 02/06/2015   Procedure: Left  Heart Cath and Coronary Angiography;  Surgeon: Leonie Man, [name];  Location: [place] [facility];  Service: Cardiovascular;  Laterality: N/A;   ESOPHAGEAL DILATION  08/2010   ESOPHAGEAL DILATION     "once or twice" (01/22/2013)   HAMMER TOE SURGERY Bilateral ~ 2002   LEFT HEART CATHETERIZATION WITH CORONARY ANGIOGRAM N/A 12/11/2012   Procedure: LEFT HEART CATHETERIZATION WITH CORONARY ANGIOGRAM;  Surgeon: Leonie Man, [name];  Location: [facility] CATH LAB;  Service: Cardiovascular;  Laterality: N/A;   LEFT HEART CATHETERIZATION WITH CORONARY ANGIOGRAM N/A 12/15/2012   Procedure: LEFT HEART CATHETERIZATION WITH  CORONARY ANGIOGRAM;  Surgeon: Leonie Man, [name];  Location: [facility] CATH LAB;  Service: Cardiovascular;  Laterality: N/A;   PERIPHERAL VENOUS STUDY  10/03/2008   No evidence of DVT, superficial thrombosis, or Baker's cyst   TONSILLECTOMY AND ADENOIDECTOMY  1960's   TUBAL LIGATION  1985    FAMHx:  Family History  Problem Relation Age of Onset   Colon cancer Brother 6   Kidney disease Mother    Stroke Father    Multiple myeloma Sister    Heart attack Brother    Other Brother    Other Brother     SOCHx:   reports that she has never smoked. She has never used smokeless tobacco. She reports that she does not drink alcohol and does not use drugs.  ALLERGIES:  No Known Allergies  ROS: Pertinent items noted in HPI and remainder of comprehensive ROS otherwise negative.  HOME MEDS: Current Outpatient Medications on File Prior to Visit  Medication Sig Dispense Refill   ALPRAZolam (XANAX) 0.5 MG tablet TAKE 1 TABLET BY MOUTH 3 TIMES DAILY AS NEEDED FOR ANXIETY (Patient taking differently: Take 0.5 mg by mouth in the morning.) 90 tablet 2   amLODipine (NORVASC) 5 MG tablet TAKE ONE TABLET BY MOUTH ONE TIME DAILY 60 tablet 1   aspirin EC 81 MG tablet Take 81 mg by mouth daily. Swallow whole.     clobetasol (TEMOVATE) 0.05 % external solution Apply topically.     clopidogrel (PLAVIX) 75 MG tablet Take 1 tablet by mouth once a day with breakfast 90 tablet 1   dicyclomine (BENTYL) 10 MG capsule Take 2 capsules (20 mg total) by mouth 4 (four) times daily -  before meals and at bedtime. (Patient taking differently: Take 20 mg by mouth as needed.) 240 capsule 0   estradiol (ESTRACE) 0.5 MG tablet Take 0.5 mg by mouth daily.     furosemide (LASIX) 20 MG tablet Take 1 tablet (20 mg total) by mouth daily. 90 tablet 3   lisinopril (ZESTRIL) 5 MG tablet TAKE ONE TABLET BY MOUTH ONE TIME DAILY 90 tablet 3   metoprolol tartrate (LOPRESSOR) 25 MG tablet TAKE ONE TABLET BY MOUTH TWICE DAILY 180 tablet 0    nitroGLYCERIN (NITROSTAT) 0.4 MG SL tablet DISSOLVE ONE TABLET UNDER TONGUE EVERY 5 MINUTES AS NEEDED FOR CHEST PAIN 25 tablet 0   pantoprazole (PROTONIX) 40 MG tablet TAKE ONE TABLET BY MOUTH ONE TIME DAILY 90 tablet 0   pimecrolimus (ELIDEL) 1 % cream SMARTSIG:1 Topical Daily PRN     ranolazine (RANEXA) 1000 MG SR tablet TAKE ONE TABLET BY MOUTH TWICE DAILY 180 tablet 3   REPATHA SURECLICK 409 MG/ML SOAJ inject 164m into the skin once every 14 days 2 mL 11   No current facility-administered medications on file prior to visit.    LABS/IMAGING: No results found for this or any previous visit (from the  past 48 hour(s)). No results found.  LIPID PANEL:    Component Value Date/Time   CHOL 194 01/26/2022 1008   TRIG 196 (H) 01/26/2022 1008   HDL 63 01/26/2022 1008   CHOLHDL 3.1 01/26/2022 1008   CHOLHDL 3 03/22/2019 0839   VLDL 35.8 03/22/2019 0839   LDLCALC 98 01/26/2022 1008   LDLDIRECT 128.0 01/12/2017 1455    WEIGHTS: Wt Readings from Last 3 Encounters:  01/29/22 225 lb 8 oz (102.3 kg)  12/25/21 223 lb 9.6 oz (101.4 kg)  11/16/21 224 lb 3.2 oz (101.7 kg)    VITALS: BP 110/76   Pulse 60   Ht 5' 3"  (1.6 m)   Wt 225 lb 8 oz (102.3 kg)   SpO2 95%   BMI 39.95 kg/m   EXAM: General appearance: alert and no distress Extremities: Left great toe is not warm or erythematous with normal range of motion  EKG: Deferred  ASSESSMENT: Mixed dyslipidemia, goal LDL less than 70 Statin intolerance-myalgias Coronary artery disease with occluded LAD Mitral valve disease Possible early left great toe podagra  PLAN: 1.   [name] was unfortunately unable to lower cholesterol further.  She still remains above target on Repatha.  She will need additional therapy and I advised starting Zetia 10 mg daily.  We will plan repeat lipids in 3 to 4 months.  She also is complaining of some left great toe pain.  She has had gout in the right great toe previously but this is not warm or  erythematous.  It could be early or could be non-gout pain.  I advised twice daily Aleve for a week to see if this could help catch early inflammation.  Follow-up with repeat lipids in 4 months.  Sabrina Casino, [name], [facility] [place] Director of the Advanced Lipid Disorders &  Cardiovascular Risk Reduction Clinic Diplomate of the American Board of Clinical Lipidology Attending Cardiologist  Direct Dial: [phone]  Fax: [phone]  Website:  [name] 01/29/2022, 8:04 AM

## 2022-01-29 NOTE — Patient Instructions (Signed)
Medication Instructions:  START zetia '10mg'$  daily OK to take aleve twice daily for a week  CONTINUE all other current medications  *If you need a refill on your cardiac medications before your next appointment, please call your pharmacy*   Lab Work: FASTING lab work to check cholesterol in 3-4 months  If you have labs (blood work) drawn today and your tests are completely normal, you will receive your results only by: [place] (if you have MyChart) OR A paper copy in the mail If you have any lab test that is abnormal or we need to change your treatment, we will call you to review the results.   Testing/Procedures: NONE   Follow-Up: At [facility], you and your health needs are our priority.  As part of our continuing mission to provide you with exceptional heart care, we have created designated Provider Care Teams.  These Care Teams include your primary Cardiologist (physician) and Advanced Practice Providers (APPs -  Physician Assistants and Nurse Practitioners) who all work together to provide you with the care you need, when you need it.  We recommend signing up for the patient portal called "MyChart".  Sign up information is provided on this After Visit Summary.  MyChart is used to connect with patients for Virtual Visits (Telemedicine).  Patients are able to view lab/test results, encounter notes, upcoming appointments, etc.  Non-urgent messages can be sent to your provider as well.   To learn more about what you can do with MyChart, go to [email or web address].    Your next appointment:   3-4 month(s)  The format for your next appointment:   In Person  Provider:   Lyman Bishop MD - lipid clinic

## 2022-02-03 ENCOUNTER — Other Ambulatory Visit: Payer: Self-pay | Admitting: Cardiovascular Disease

## 2022-02-12 ENCOUNTER — Other Ambulatory Visit: Payer: Self-pay | Admitting: Cardiovascular Disease

## 2022-02-12 ENCOUNTER — Telehealth: Payer: Self-pay | Admitting: Internal Medicine

## 2022-02-12 ENCOUNTER — Telehealth: Payer: Self-pay | Admitting: Cardiovascular Disease

## 2022-02-12 NOTE — Telephone Encounter (Signed)
Inbound call from patient stating that she needs a PA for Protonix. Please advise.

## 2022-02-12 NOTE — Telephone Encounter (Signed)
Pt c/o medication issue:  1. Name of Medication: pantoprazole (PROTONIX) 40 MG tablet  2. How are you currently taking this medication (dosage and times per day)? As prescribed  3. Are you having a reaction (difficulty breathing--STAT)? No   4. What is your medication issue? Patient is calling stating she received a letter in the mail from her insurance that she is needing PA for this medication before she can get her next refill. Please advise.

## 2022-02-12 NOTE — Telephone Encounter (Signed)
Called pt to let her know Dr. Gwenlyn Found does not normally send in Protonix. She states well the paperwork I got from my insurance said I needed a PA from you guys for this medication. I explained to her that could be because it's not a typical cardiac medication. She states "Dr. Gwenlyn Found does want me to make this medication because of all the medications I'm on and he does not want it to mess up my stomach." Pt also states "I can have my GI doctor Dr. Henrene Pastor to fill it." Pt was thankful for the call. No further concerns at this time.

## 2022-02-18 ENCOUNTER — Other Ambulatory Visit (HOSPITAL_COMMUNITY): Payer: Self-pay

## 2022-02-21 NOTE — Patient Instructions (Incomplete)
Good to see you today- I will be in touch with your labs If not done already I would suggest getting the shingles series and a covid booster at your pharmacy   I do think you likely have gout- we will check your uric acid level today to help [place] figure this out Prednisone 40 mg for 5 days Start on allopurinol at 100 mg   Please keep me posted

## 2022-02-21 NOTE — Progress Notes (Addendum)
[facility] [address] [place], [place] [address] [phone]  Date:  02/24/2022   Name:  [name]   DOB:  [birth year]   MRN:  [id number]  PCP:  Sabrina Cables, MD    Chief Complaint: Toe Pain (L. Foot Big Toe Issues x over 1 month.)   History of Present Illness:  [name] is a 66 y.o. very pleasant female patient who presents with the following:  Pt seen today with a foot concern Last visit with myself was in September  History of HTN, CAD/NSTEMI, obesity, hyperlipidemia, IBS, hypothyroidism, GI bleed last year   Seen by cardiology in June - Dr Rennis Golden  ASSESSMENT: Mixed dyslipidemia, goal LDL less than 70 Statin intolerance-myalgias Coronary artery disease with occluded LAD Mitral valve disease Possible early left great toe podagra   PLAN: 1.   [name] was unfortunately unable to lower cholesterol further.  She still remains above target on Repatha.  She will need additional therapy and I advised starting Zetia 10 mg daily.  We will plan repeat lipids in 3 to 4 months.  She also is complaining of some left great toe pain.  She has had gout in the right great toe previously but this is not warm or erythematous.  It could be early or could be non-gout pain.  I advised twice daily Aleve for a week to see if this could help catch early inflammation.  Dexa- will do with her GYN this fall  Mammo- will do with her GYN this fall  Colon 2019  She has noted left great toe pain for about a month- no injury that she can recall The pain is nearly always there- better with rest but painful again as soon as she starts walking.  She has had to limit her activity She has a history of gout - this does remind her of previous gout attacks except the toe is not red  no other joint issues She otherwise feels fine She has been using ibuprofen some as needed for pain She did recently visit the beach,  but notes she had the pain prior to her vacation  Patient Active Problem List   Diagnosis Date Noted   Acute GI bleeding 04/17/2021   Colitis 04/17/2021   Lower abdominal pain 01/28/2021   Dyspnea on exertion 10/24/2019   Pre-diabetes 01/14/2017   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   Tachycardia, somewhat irregular, episodic 10/08/2013   Dizziness 10/08/2013   Angina effort 05/22/2013   Unstable angina (HCC) 01/22/2013   Hyperlipidemia 01/01/2013   CAD (coronary artery disease), possible SCAD 12/14/2012   NSTEMI (non-ST elevated myocardial infarction) (HCC) 12/10/2012   Essential hypertension 12/10/2012   Hypothyroidism 12/10/2012   Obesity (BMI 35.0-39.9 without comorbidity) 12/10/2012   Gastroenteritis 10/08/2011   Acute ischemic colitis (HCC) 08/09/2011   DYSPHAGIA UNSPECIFIED 07/01/2010   RECTAL BLEEDING Jan 2013 11/04/2009   GERD 02/11/2009   Irritable bowel syndrome 02/11/2009   Generalized abdominal pain 02/11/2009   PERSONAL HX COLONIC POLYPS 02/11/2009    Past Medical History:  Diagnosis Date   Angina effort 05/22/2013   Anxiety    CAD (coronary artery disease), possible SCAD 12/14/2012   Colon polyps    Duodenitis    Dyspnea on exertion    LEXISCAN, 09/30/2008 - normal, EKG negative for ischemia, no ECG changes   Esophageal stricture    Gastritis    GERD (gastroesophageal reflux disease)  Gout    "only from RX given after MI" (01/22/2013)   H/O hiatal hernia    History of esophageal stricture    Hypertension    2D ECHO, 09/30/2008 - EF >55%, normal: RENAL DOPPLER, 03/21/2007 - normal duplex study   Hypothyroidism    IBS (irritable bowel syndrome)    Ischemic colitis (HCC)    Lower GI bleeding    10/07/11 "I've had 3 episodes in the past year"   Myocardial infarction [facility]) 12/10/2012   "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)   Tubular adenoma polyp of rectum 2008    Past Surgical History:  Procedure Laterality Date   ABDOMINAL  HYSTERECTOMY  1999   ANTERIOR LUMBAR FUSION  2010   "L4-5" (01/22/2013)   CARDIAC CATHETERIZATION N/A 02/06/2015   Procedure: Left Heart Cath and Coronary Angiography;  Surgeon: Marykay Lex, MD;  Location: [facility] INVASIVE CV LAB;  Service: Cardiovascular;  Laterality: N/A;   ESOPHAGEAL DILATION  08/2010   ESOPHAGEAL DILATION     "once or twice" (01/22/2013)   HAMMER TOE SURGERY Bilateral ~ 2002   LEFT HEART CATHETERIZATION WITH CORONARY ANGIOGRAM N/A 12/11/2012   Procedure: LEFT HEART CATHETERIZATION WITH CORONARY ANGIOGRAM;  Surgeon: Marykay Lex, MD;  Location: [facility] CATH LAB;  Service: Cardiovascular;  Laterality: N/A;   LEFT HEART CATHETERIZATION WITH CORONARY ANGIOGRAM N/A 12/15/2012   Procedure: LEFT HEART CATHETERIZATION WITH CORONARY ANGIOGRAM;  Surgeon: Marykay Lex, MD;  Location: [facility] CATH LAB;  Service: Cardiovascular;  Laterality: N/A;   PERIPHERAL VENOUS STUDY  10/03/2008   No evidence of DVT, superficial thrombosis, or Baker's cyst   TONSILLECTOMY AND ADENOIDECTOMY  1960's   TUBAL LIGATION  1985    Social History   Tobacco Use   Smoking status: Never   Smokeless tobacco: Never  Vaping Use   Vaping Use: Never used  Substance Use Topics   Alcohol use: No   Drug use: No    Family History  Problem Relation Age of Onset   Colon cancer Brother 57   Kidney disease Mother    Stroke Father    Multiple myeloma Sister    Heart attack Brother    Other Brother    Other Brother     No Known Allergies  Medication list has been reviewed and updated.  Current Outpatient Medications on File Prior to Visit  Medication Sig Dispense Refill   ALPRAZolam (XANAX) 0.5 MG tablet TAKE 1 TABLET BY MOUTH 3 TIMES DAILY AS NEEDED FOR ANXIETY (Patient taking differently: Take 0.5 mg by mouth in the morning.) 90 tablet 2   amLODipine (NORVASC) 5 MG tablet Take 1 tablet (5 mg total) by mouth daily. 90 tablet 3   aspirin EC 81 MG tablet Take 81 mg by mouth daily. Swallow whole.      clopidogrel (PLAVIX) 75 MG tablet Take 1 tablet by mouth once a day with breakfast 90 tablet 1   dicyclomine (BENTYL) 10 MG capsule Take 2 capsules (20 mg total) by mouth 4 (four) times daily -  before meals and at bedtime. (Patient taking differently: Take 20 mg by mouth as needed.) 240 capsule 0   estradiol (ESTRACE) 0.5 MG tablet Take 0.5 mg by mouth daily.     ezetimibe (ZETIA) 10 MG tablet Take 1 tablet (10 mg total) by mouth daily. 90 tablet 3   furosemide (LASIX) 20 MG tablet Take 1 tablet (20 mg total) by mouth daily. 90 tablet 3   lisinopril (ZESTRIL) 5 MG tablet TAKE ONE  TABLET BY MOUTH ONE TIME DAILY 90 tablet 3   metoprolol tartrate (LOPRESSOR) 25 MG tablet TAKE ONE TABLET BY MOUTH TWICE DAILY 180 tablet 1   nitroGLYCERIN (NITROSTAT) 0.4 MG SL tablet DISSOLVE ONE TABLET UNDER TONGUE EVERY 5 MINUTES AS NEEDED FOR CHEST PAIN 25 tablet 0   pantoprazole (PROTONIX) 40 MG tablet TAKE ONE TABLET BY MOUTH ONE TIME DAILY 90 tablet 0   ranolazine (RANEXA) 1000 MG SR tablet TAKE ONE TABLET BY MOUTH TWICE DAILY 180 tablet 3   REPATHA SURECLICK 140 MG/ML SOAJ inject 140mg  into the skin once every 14 days 2 mL 11   clobetasol (TEMOVATE) 0.05 % external solution Apply topically. (Patient not taking: Reported on 02/24/2022)     pimecrolimus (ELIDEL) 1 % cream SMARTSIG:1 Topical Daily PRN (Patient not taking: Reported on 02/24/2022)     No current facility-administered medications on file prior to visit.    Review of Systems:  As per HPI- otherwise negative.   Physical Examination: Vitals:   02/24/22 1422  BP: 116/70  Pulse: 64  Resp: 18  Temp: 97.9 F (36.6 C)  SpO2: 94%   Vitals:   02/24/22 1422  Height: 5\' 3"  (1.6 m)   Body mass index is 39.95 kg/m. Ideal Body Weight: Weight in (lb) to have BMI = 25: 140.8  GEN: no acute distress.  Obese, looks well HEENT: Atraumatic, Normocephalic.  Ears and Nose: No external deformity. CV: RRR, No M/G/R. No JVD. No thrill. No extra heart  sounds. PULM: CTA B, no wheezes, crackles, rhonchi. No retractions. No resp. distress. No accessory muscle use. EXTR: No c/c/e PSYCH: Normally interactive. Conversant.  There is trace swelling of the left foot, most pronounced over the dorsum.  Normal pulses.  She has tenderness with movement of the first MCP.  No heat or redness Right foot is normal Left calf is normal, no pain or swelling  Assessment and Plan: Coronary artery disease involving native coronary artery of native heart without angina pectoris  Essential hypertension  Pre-diabetes - Plan: Hemoglobin A1c, Basic metabolic panel  Hypothyroidism, unspecified type - Plan: TSH  Great toe pain, left - Plan: Uric acid, allopurinol (ZYLOPRIM) 100 MG tablet, predniSONE (DELTASONE) 20 MG tablet  Patient seen today with likely gout in her left great toe.  We will treat with prednisone 40 mg for 5 days, start allopurinol.  Uric acid level is pending She does have history of renal insufficiency, will check BMP today.  We may end up needing to use colchicine  Blood pressure under good control Follow-up on prediabetes, thyroid today Advised to let me know if not significantly better within 2 days  Signed Abbe Amsterdam, MD  Addendum 7/27, received labs as below.  Message to patient  Results for orders placed or performed in visit on 02/24/22  Hemoglobin A1c  Result Value Ref Range   Hgb A1c MFr Bld 5.8 4.6 - 6.5 %  TSH  Result Value Ref Range   TSH 3.20 0.35 - 5.50 uIU/mL  Uric acid  Result Value Ref Range   Uric Acid, Serum 9.4 (H) 2.4 - 7.0 mg/dL  Basic metabolic panel  Result Value Ref Range   Sodium 136 135 - 145 mEq/L   Potassium 4.3 3.5 - 5.1 mEq/L   Chloride 100 96 - 112 mEq/L   CO2 26 19 - 32 mEq/L   Glucose, Bld 129 (H) 70 - 99 mg/dL   BUN 19 6 - 23 mg/dL   Creatinine, Ser 0.10 (H) 0.40 -  1.20 mg/dL   GFR 16.10 (L) >96.04 mL/min   Calcium 9.2 8.4 - 10.5 mg/dL

## 2022-02-22 NOTE — Telephone Encounter (Signed)
Sent patient a Pharmacist, community message with the info from the PA team.  She should have enough through the end of August

## 2022-02-24 ENCOUNTER — Ambulatory Visit (INDEPENDENT_AMBULATORY_CARE_PROVIDER_SITE_OTHER): Payer: Medicare Other | Admitting: Family Medicine

## 2022-02-24 VITALS — BP 116/70 | HR 64 | Temp 97.9°F | Resp 18 | Ht 63.0 in

## 2022-02-24 DIAGNOSIS — R7303 Prediabetes: Secondary | ICD-10-CM | POA: Diagnosis not present

## 2022-02-24 DIAGNOSIS — M79675 Pain in left toe(s): Secondary | ICD-10-CM

## 2022-02-24 DIAGNOSIS — I2510 Atherosclerotic heart disease of native coronary artery without angina pectoris: Secondary | ICD-10-CM | POA: Diagnosis not present

## 2022-02-24 DIAGNOSIS — I10 Essential (primary) hypertension: Secondary | ICD-10-CM | POA: Diagnosis not present

## 2022-02-24 DIAGNOSIS — N289 Disorder of kidney and ureter, unspecified: Secondary | ICD-10-CM

## 2022-02-24 DIAGNOSIS — E039 Hypothyroidism, unspecified: Secondary | ICD-10-CM

## 2022-02-24 MED ORDER — ALLOPURINOL 100 MG PO TABS: 100.0000 mg | ORAL_TABLET | Freq: Every day | ORAL | 6 refills | Status: DC

## 2022-02-24 MED ORDER — PREDNISONE 20 MG PO TABS: 40.0000 mg | ORAL_TABLET | Freq: Every day | ORAL | 0 refills | Status: DC

## 2022-02-25 ENCOUNTER — Encounter: Payer: Self-pay | Admitting: Family Medicine

## 2022-02-25 LAB — BASIC METABOLIC PANEL
BUN: 19 mg/dL (ref 6–23)
CO2: 26 mEq/L (ref 19–32)
Calcium: 9.2 mg/dL (ref 8.4–10.5)
Chloride: 100 mEq/L (ref 96–112)
Creatinine, Ser: 1.46 mg/dL — ABNORMAL HIGH (ref 0.40–1.20)
GFR: 37.43 mL/min — ABNORMAL LOW (ref >60.00)
Glucose, Bld: 129 mg/dL — ABNORMAL HIGH (ref 70–99)
Potassium: 4.3 mEq/L (ref 3.5–5.1)
Sodium: 136 mEq/L (ref 135–145)

## 2022-02-25 LAB — TSH: TSH: 3.2 u[IU]/mL (ref 0.35–5.50)

## 2022-02-25 LAB — URIC ACID: Uric Acid, Serum: 9.4 mg/dL — ABNORMAL HIGH (ref 2.4–7.0)

## 2022-02-25 LAB — HEMOGLOBIN A1C: Hgb A1c MFr Bld: 5.8 % (ref 4.6–6.5)

## 2022-02-25 NOTE — Addendum Note (Signed)
Addended by: Lamar Blinks C on: 02/25/2022 12:54 PM   Modules accepted: Orders

## 2022-03-02 ENCOUNTER — Encounter: Payer: Self-pay | Admitting: Family Medicine

## 2022-03-02 ENCOUNTER — Other Ambulatory Visit: Payer: Self-pay | Admitting: Family Medicine

## 2022-03-02 DIAGNOSIS — M79675 Pain in left toe(s): Secondary | ICD-10-CM

## 2022-03-02 MED ORDER — PREDNISONE 20 MG PO TABS: 20.0000 mg | ORAL_TABLET | Freq: Every day | ORAL | 0 refills | Status: DC

## 2022-03-19 NOTE — Progress Notes (Unsigned)
[place] [facility] [address], [place], [place] [address] [phone]  Date:  03/22/2022   Name:  [name]   DOB:  Jan 30, 1956   MRN:  [id number]  PCP:  Sabrina Mclean, MD    Chief Complaint: Toe Pain (Seen 02/24/22: started on allopurinol 100 MG tablet and  prednisone 20 MG tablet- she is not taking the Allopurinol, and has completed the Prednisone. Would like an xray. )   History of Present Illness:  [name] is a 66 y.o. very pleasant female patient who presents with the following:  Pt seen today with concern of toe pain Last visit with myself 7/26- History of HTN, CAD/NSTEMI, obesity, hyperlipidemia, IBS, hypothyroidism, GI bleed last year   At our last visit:  She has noted left great toe pain for about a month- no injury that she can recall The pain is nearly always there- better with rest but painful again as soon as she starts walking.  She has had to limit her activity She has a history of gout - this does remind her of previous gout attacks except the toe is not red  no other joint issues She otherwise feels fine She has been using ibuprofen some as needed for pain She did recently visit the beach, but notes she had the pain prior to her vacation   I suspected gout and treated her with prednisone and allopurinol Uric acid was elevated at 9.4 We did not get x-rays at that time She did get better when she was on the prednisone- however once she stopped it the pain came back She just took the allopurinol while she was on the pred -did not understand she was meant to continue using it. Over the last 1 to 2 weeks her pain has gotten worse again.  She can typically only wear her athletic shoes.  She notes some mild swelling of the left first MCP Patient Active Problem List   Diagnosis Date Noted   Acute GI bleeding 04/17/2021   Colitis 04/17/2021   Lower abdominal pain 01/28/2021   Dyspnea on  exertion 10/24/2019   Pre-diabetes 01/14/2017   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   Tachycardia, somewhat irregular, episodic 10/08/2013   Dizziness 10/08/2013   Angina effort 05/22/2013   Unstable angina ([place]) 01/22/2013   Hyperlipidemia 01/01/2013   CAD (coronary artery disease), possible SCAD 12/14/2012   NSTEMI (non-ST elevated myocardial infarction) ([place]) 12/10/2012   Essential hypertension 12/10/2012   Hypothyroidism 12/10/2012   Obesity (BMI 35.0-39.9 without comorbidity) 12/10/2012   Gastroenteritis 10/08/2011   Acute ischemic colitis ([place]) 08/09/2011   DYSPHAGIA UNSPECIFIED 07/01/2010   RECTAL BLEEDING Jan 2013 11/04/2009   GERD 02/11/2009   Irritable bowel syndrome 02/11/2009   Generalized abdominal pain 02/11/2009   PERSONAL HX COLONIC POLYPS 02/11/2009    Past Medical History:  Diagnosis Date   Angina effort 05/22/2013   Anxiety    CAD (coronary artery disease), possible SCAD 12/14/2012   Colon polyps    Duodenitis    Dyspnea on exertion    LEXISCAN, 09/30/2008 - normal, EKG negative for ischemia, no ECG changes   Esophageal stricture    Gastritis    GERD (gastroesophageal reflux disease)    Gout    "only from RX given after MI" (01/22/2013)   H/O hiatal hernia    History of esophageal stricture    Hypertension    2D ECHO, 09/30/2008 - EF >55%,  normal: RENAL DOPPLER, 03/21/2007 - normal duplex study   Hypothyroidism    IBS (irritable bowel syndrome)    Ischemic colitis ([place])    Lower GI bleeding    10/07/11 "I've had 3 episodes in the past year"   Myocardial infarction [facility]) 12/10/2012   "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)   Tubular adenoma polyp of rectum 2008    Past Surgical History:  Procedure Laterality Date   ABDOMINAL HYSTERECTOMY  1999   ANTERIOR LUMBAR FUSION  2010   "L4-5" (01/22/2013)   CARDIAC CATHETERIZATION N/A 02/06/2015   Procedure: Left Heart Cath and Coronary Angiography;  Surgeon: Leonie Man, MD;  Location: [place] CV LAB;  Service: Cardiovascular;  Laterality: N/A;   ESOPHAGEAL DILATION  08/2010   ESOPHAGEAL DILATION     "once or twice" (01/22/2013)   HAMMER TOE SURGERY Bilateral ~ 2002   LEFT HEART CATHETERIZATION WITH CORONARY ANGIOGRAM N/A 12/11/2012   Procedure: LEFT HEART CATHETERIZATION WITH CORONARY ANGIOGRAM;  Surgeon: Leonie Man, MD;  Location: [facility] CATH LAB;  Service: Cardiovascular;  Laterality: N/A;   LEFT HEART CATHETERIZATION WITH CORONARY ANGIOGRAM N/A 12/15/2012   Procedure: LEFT HEART CATHETERIZATION WITH CORONARY ANGIOGRAM;  Surgeon: Leonie Man, MD;  Location: [facility] CATH LAB;  Service: Cardiovascular;  Laterality: N/A;   PERIPHERAL VENOUS STUDY  10/03/2008   No evidence of DVT, superficial thrombosis, or Baker's cyst   TONSILLECTOMY AND ADENOIDECTOMY  1960's   TUBAL LIGATION  1985    Social History   Tobacco Use   Smoking status: Never   Smokeless tobacco: Never  Vaping Use   Vaping Use: Never used  Substance Use Topics   Alcohol use: No   Drug use: No    Family History  Problem Relation Age of Onset   Colon cancer Brother 31   Kidney disease Mother    Stroke Father    Multiple myeloma Sister    Heart attack Brother    Other Brother    Other Brother     No Known Allergies  Medication list has been reviewed and updated.  Current Outpatient Medications on File Prior to Visit  Medication Sig Dispense Refill   allopurinol (ZYLOPRIM) 100 MG tablet Take 1 tablet (100 mg total) by mouth daily. 30 tablet 6   ALPRAZolam (XANAX) 0.5 MG tablet TAKE 1 TABLET BY MOUTH 3 TIMES DAILY AS NEEDED FOR ANXIETY (Patient taking differently: Take 0.5 mg by mouth in the morning.) 90 tablet 2   amLODipine (NORVASC) 5 MG tablet Take 1 tablet (5 mg total) by mouth daily. 90 tablet 3   aspirin EC 81 MG tablet Take 81 mg by mouth daily. Swallow whole.     clobetasol (TEMOVATE) 0.05 % external solution Apply topically.     clopidogrel (PLAVIX) 75 MG tablet Take 1 tablet by  mouth once a day with breakfast 90 tablet 1   dicyclomine (BENTYL) 10 MG capsule Take 2 capsules (20 mg total) by mouth 4 (four) times daily -  before meals and at bedtime. (Patient taking differently: Take 20 mg by mouth as needed.) 240 capsule 0   estradiol (ESTRACE) 0.5 MG tablet Take 0.5 mg by mouth daily.     ezetimibe (ZETIA) 10 MG tablet Take 1 tablet (10 mg total) by mouth daily. 90 tablet 3   furosemide (LASIX) 20 MG tablet Take 1 tablet (20 mg total) by mouth daily. 90 tablet 3   lisinopril (ZESTRIL) 5 MG tablet TAKE ONE TABLET BY MOUTH ONE TIME  DAILY 90 tablet 3   metoprolol tartrate (LOPRESSOR) 25 MG tablet TAKE ONE TABLET BY MOUTH TWICE DAILY 180 tablet 1   nitroGLYCERIN (NITROSTAT) 0.4 MG SL tablet DISSOLVE ONE TABLET UNDER TONGUE EVERY 5 MINUTES AS NEEDED FOR CHEST PAIN 25 tablet 0   pantoprazole (PROTONIX) 40 MG tablet TAKE ONE TABLET BY MOUTH ONE TIME DAILY 90 tablet 0   pimecrolimus (ELIDEL) 1 % cream      ranolazine (RANEXA) 1000 MG SR tablet TAKE ONE TABLET BY MOUTH TWICE DAILY 180 tablet 3   REPATHA SURECLICK 750 MG/ML SOAJ inject 164m into the skin once every 14 days 2 mL 11   No current facility-administered medications on file prior to visit.    Review of Systems:  As per HPI- otherwise negative.   Physical Examination: Vitals:   03/22/22 1300  BP: 110/74  Pulse: 68  Resp: 18  Temp: 97.6 F (36.4 C)  SpO2: 95%   Vitals:   03/22/22 1300  Weight: 224 lb 9.6 oz (101.9 kg)  Height: 5' 3"  (1.6 m)   Body mass index is 39.79 kg/m. Ideal Body Weight: Weight in (lb) to have BMI = 25: 140.8  GEN: no acute distress.  Obese, looks well  HEENT: Atraumatic, Normocephalic.  Ears and Nose: No external deformity. CV: RRR, No M/G/R. No JVD. No thrill. No extra heart sounds. PULM: CTA B, no wheezes, crackles, rhonchi. No retractions. No resp. distress. No accessory muscle use. EXTR: No c/c/e PSYCH: Normally interactive. Conversant.  Left great toe: There is mild  tenderness and minimal swelling at the first MTP joint.  No heat or redness at this time  Assessment and Plan: Great toe pain, left - Plan: DG Foot Complete Left, predniSONE (DELTASONE) 20 MG tablet, Uric acid Patient seen today with very likely gout in her left great toe.  We will obtain x-rays today.  She responded previously to prednisone, but misunderstood and did not continue taking her allopurinol after she finished treatment.  We will retreat with prednisone, she will start back on allopurinol, check a uric acid level in about 3 to 4 weeks so we can adjust dosage as needed Avoid NSAIDs while on prednisone  Signed JLamar Blinks MD

## 2022-03-22 ENCOUNTER — Ambulatory Visit (INDEPENDENT_AMBULATORY_CARE_PROVIDER_SITE_OTHER): Payer: Medicare Other | Admitting: Family Medicine

## 2022-03-22 ENCOUNTER — Ambulatory Visit (HOSPITAL_BASED_OUTPATIENT_CLINIC_OR_DEPARTMENT_OTHER)
Admission: RE | Admit: 2022-03-22 | Discharge: 2022-03-22 | Disposition: A | Discharge status: Home / Self Care | Payer: Medicare Other | Source: Ambulatory Visit | Attending: Family Medicine | Admitting: Family Medicine

## 2022-03-22 DIAGNOSIS — M79675 Pain in left toe(s): Secondary | ICD-10-CM | POA: Insufficient documentation

## 2022-03-22 DIAGNOSIS — I2510 Atherosclerotic heart disease of native coronary artery without angina pectoris: Secondary | ICD-10-CM

## 2022-03-22 MED ORDER — PREDNISONE 20 MG PO TABS: 20.0000 mg | ORAL_TABLET | Freq: Every day | ORAL | 0 refills | Status: DC

## 2022-03-22 NOTE — Patient Instructions (Signed)
It was good to see you today- I will be in touch with your x-rays asap Use the prednisone again for 5 days - and go back on allopurinol.  Please continue the allopurinol to help bring down your uric acid level (uric acid is the cause of gout!)  Recheck uric acid in 3-4 weeks; we can then determine if we need to increase allopurinol level

## 2022-03-24 ENCOUNTER — Encounter: Payer: Self-pay | Admitting: Family Medicine

## 2022-03-25 ENCOUNTER — Other Ambulatory Visit: Payer: Self-pay | Admitting: Cardiovascular Disease

## 2022-04-01 ENCOUNTER — Encounter: Payer: Self-pay | Admitting: Family Medicine

## 2022-04-01 DIAGNOSIS — M79675 Pain in left toe(s): Secondary | ICD-10-CM

## 2022-04-01 MED ORDER — ALLOPURINOL 200 MG PO TABS: 200.0000 mg | ORAL_TABLET | Freq: Every day | ORAL | 3 refills | Status: DC

## 2022-04-01 MED ORDER — PREDNISONE 20 MG PO TABS: 20.0000 mg | ORAL_TABLET | Freq: Every day | ORAL | 0 refills | Status: DC

## 2022-04-01 NOTE — Addendum Note (Signed)
Addended by: Lamar Blinks C on: 04/01/2022 05:06 PM   Modules accepted: Orders

## 2022-04-12 ENCOUNTER — Other Ambulatory Visit (INDEPENDENT_AMBULATORY_CARE_PROVIDER_SITE_OTHER): Payer: Medicare Other

## 2022-04-12 ENCOUNTER — Encounter: Payer: Self-pay | Admitting: Family Medicine

## 2022-04-12 DIAGNOSIS — N289 Disorder of kidney and ureter, unspecified: Secondary | ICD-10-CM

## 2022-04-12 DIAGNOSIS — M79675 Pain in left toe(s): Secondary | ICD-10-CM

## 2022-04-12 LAB — BASIC METABOLIC PANEL
BUN: 16 mg/dL (ref 6–23)
CO2: 25 mEq/L (ref 19–32)
Calcium: 9.5 mg/dL (ref 8.4–10.5)
Chloride: 102 mEq/L (ref 96–112)
Creatinine, Ser: 1.14 mg/dL (ref 0.40–1.20)
GFR: 50.33 mL/min — ABNORMAL LOW (ref >60.00)
Glucose, Bld: 95 mg/dL (ref 70–99)
Potassium: 4.2 mEq/L (ref 3.5–5.1)
Sodium: 137 mEq/L (ref 135–145)

## 2022-04-12 LAB — URIC ACID: Uric Acid, Serum: 6 mg/dL (ref 2.4–7.0)

## 2022-04-21 ENCOUNTER — Encounter: Payer: Self-pay | Admitting: Family Medicine

## 2022-05-03 ENCOUNTER — Other Ambulatory Visit: Payer: Self-pay | Admitting: Cardiovascular Disease

## 2022-05-05 LAB — LIPID PANEL
Chol/HDL Ratio: 1.9 ratio (ref 0.0–4.4)
Cholesterol, Total: 126 mg/dL (ref 100–199)
HDL: 65 mg/dL (ref >39)
LDL Chol Calc (NIH): 37 mg/dL (ref 0–99)
Triglycerides: 146 mg/dL (ref 0–149)
VLDL Cholesterol Cal: 24 mg/dL (ref 5–40)

## 2022-05-10 LAB — HM MAMMOGRAPHY

## 2022-05-10 LAB — HM DEXA SCAN: HM Dexa Scan: NORMAL

## 2022-05-11 ENCOUNTER — Ambulatory Visit (INDEPENDENT_AMBULATORY_CARE_PROVIDER_SITE_OTHER): Payer: Medicare Other | Admitting: Internal Medicine

## 2022-05-11 ENCOUNTER — Encounter (HOSPITAL_BASED_OUTPATIENT_CLINIC_OR_DEPARTMENT_OTHER): Payer: Self-pay | Admitting: Internal Medicine

## 2022-05-11 VITALS — HR 62 | Ht 63.0 in | Wt 229.7 lb

## 2022-05-11 DIAGNOSIS — I2510 Atherosclerotic heart disease of native coronary artery without angina pectoris: Secondary | ICD-10-CM

## 2022-05-11 DIAGNOSIS — M791 Myalgia, unspecified site: Secondary | ICD-10-CM | POA: Diagnosis not present

## 2022-05-11 DIAGNOSIS — T466X5D Adverse effect of antihyperlipidemic and antiarteriosclerotic drugs, subsequent encounter: Secondary | ICD-10-CM

## 2022-05-11 DIAGNOSIS — E785 Hyperlipidemia, unspecified: Secondary | ICD-10-CM | POA: Diagnosis not present

## 2022-05-11 NOTE — Progress Notes (Signed)
LIPID CLINIC CONSULT NOTE  Chief Complaint:  Follow-up dyslipidemia  Primary Care Physician: Sabrina Mclean, MD  Primary Cardiologist:  Quay Burow, MD  HPI:  [name] is a 66 y.o. female who is being seen today for the evaluation of dyslipidemia at the request of [name], Sabrina Filler, MD.  This is a pleasant 66 year old female patient of Dr. Alvester Day with a history of coronary artery disease and occluded LAD as well as mitral valvular disease.  She has had persistent dyslipidemia and unfortunately statin intolerance.  Most recently her lipids show total cholesterol 236, triglycerides 150, HDL 72 and LDL 138.  Her target LDL is less than 70.  She was approved for Repatha and took it for a brief period of time however was getting it via mail order which was cumbersome and the cost was about $70 a month which was not affordable for her.  Ultimately she discontinued it.  She is here today to discuss additional options.  I do think that a PCSK9 inhibitor is the best option for her.  Other options could be ezetimibe, Nexletol or other therapies however cardiovascular risk reduction is not a significant or unknown.  02/12/2020  Ms. [name] returns today for follow-up.  She seems to be tolerating Repatha with a nice improvement in her lipids.  Her total cholesterol is decreased from 236-149, triglycerides have gone up slightly to 170 with a decrease in LDL cholesterol from 138-60.  10/10/2020  [name] returns today for follow-up of dyslipidemia.  She had labs drawn last month and has much improved cholesterol although not quite to goal.  Her total cholesterol is 175, HDL 64 LDL 80 and triglycerides 187.  Her target LDL is less than 70.  01/29/2022  [name] returns today for follow-up.  We have been planning a 82-monthaggressive diet and exercise routine to try to lower cholesterol further.  Unfortunately her cholesterol is worse.  Total cholesterol now 194,  triglycerides 196, HDL 63 and LDL 98 (up from 95).  I discussed that she remains well above her target LDL less than 70.  She also is complaining of some left great toe pain.  She apparently had had gout in her right toe in the past and was concerned about this  05/11/2022  Mrs. ZKeelerreturns today for follow-up.  I have added ezetimibe to her list of meds.  She is frustrated today because of how many medicines she is on however her cholesterol is now much better controlled.  She had been struggling with gout and now is on allopurinol with some improvement.  She had to take steroids.  Her total cholesterol now 126, triglycerides 146, HDL 65 and LDL 37 which is outstanding.  She remains on combination therapy with ezetimibe and Repatha.  PMHx:  Past Medical History:  Diagnosis Date   Angina effort 05/22/2013   Anxiety    CAD (coronary artery disease), possible SCAD 12/14/2012   Colon polyps    Duodenitis    Dyspnea on exertion    LEXISCAN, 09/30/2008 - normal, EKG negative for ischemia, no ECG changes   Esophageal stricture    Gastritis    GERD (gastroesophageal reflux disease)    Gout    "only from RX given after MI" (01/22/2013)   H/O hiatal hernia    History of esophageal stricture    Hypertension    2D ECHO, 09/30/2008 - EF >55%, normal: RENAL DOPPLER, 03/21/2007 - normal duplex study   Hypothyroidism  IBS (irritable bowel syndrome)    Ischemic colitis ([place])    Lower GI bleeding    10/07/11 "I've had 3 episodes in the past year"   Myocardial infarction [facility]) 12/10/2012   "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)   Tubular adenoma polyp of rectum 2008    Past Surgical History:  Procedure Laterality Date   ABDOMINAL HYSTERECTOMY  1999   ANTERIOR LUMBAR FUSION  2010   "L4-5" (01/22/2013)   CARDIAC CATHETERIZATION N/A 02/06/2015   Procedure: Left Heart Cath and Coronary Angiography;  Surgeon: Leonie Man, MD;  Location: [place] CV LAB;  Service: Cardiovascular;   Laterality: N/A;   ESOPHAGEAL DILATION  08/2010   ESOPHAGEAL DILATION     "once or twice" (01/22/2013)   HAMMER TOE SURGERY Bilateral ~ 2002   LEFT HEART CATHETERIZATION WITH CORONARY ANGIOGRAM N/A 12/11/2012   Procedure: LEFT HEART CATHETERIZATION WITH CORONARY ANGIOGRAM;  Surgeon: Leonie Man, MD;  Location[facility] CATH LAB;  Service: Cardiovascular;  Laterality: N/A;   LEFT HEART CATHETERIZATION WITH CORONARY ANGIOGRAM N/A 12/15/2012   Procedure: LEFT HEART CATHETERIZATION WITH CORONARY ANGIOGRAM;  Surgeon: Leonie Man, MD;  Location: [facility] CATH LAB;  Service: Cardiovascular;  Laterality: N/A;   PERIPHERAL VENOUS STUDY  10/03/2008   No evidence of DVT, superficial thrombosis, or Baker's cyst   TONSILLECTOMY AND ADENOIDECTOMY  1960's   TUBAL LIGATION  1985    FAMHx:  Family History  Problem Relation Age of Onset   Colon cancer Brother 27   Day disease Mother    Stroke Father    Multiple myeloma Sister    Heart attack Brother    Other Brother    Other Brother     SOCHx:   reports that she has never smoked. She has never used smokeless tobacco. She reports that she does not drink alcohol and does not use drugs.  ALLERGIES:  No Known Allergies  ROS: Pertinent items noted in HPI and remainder of comprehensive ROS otherwise negative.  HOME MEDS: Current Outpatient Medications on File Prior to Visit  Medication Sig Dispense Refill   allopurinol 200 MG TABS Take 200 mg by mouth daily. (Patient taking differently: Take 300 mg by mouth daily.) 90 tablet 3   ALPRAZolam (XANAX) 0.5 MG tablet TAKE 1 TABLET BY MOUTH 3 TIMES DAILY AS NEEDED FOR ANXIETY (Patient taking differently: Take 0.5 mg by mouth in the morning.) 90 tablet 2   amLODipine (NORVASC) 5 MG tablet Take 1 tablet (5 mg total) by mouth daily. 90 tablet 3   aspirin EC 81 MG tablet Take 81 mg by mouth daily. Swallow whole.     clobetasol (TEMOVATE) 0.05 % external solution Apply topically.     clopidogrel (PLAVIX) 75 MG  tablet TAKE ONE TABLET BY MOUTH ONE TIME DAILY WITH BREAKFAST 90 tablet 0   dicyclomine (BENTYL) 10 MG capsule Take 2 capsules (20 mg total) by mouth 4 (four) times daily -  before meals and at bedtime. (Patient taking differently: Take 20 mg by mouth as needed.) 240 capsule 0   estradiol (ESTRACE) 0.5 MG tablet Take 0.5 mg by mouth daily.     ezetimibe (ZETIA) 10 MG tablet Take 1 tablet (10 mg total) by mouth daily. 90 tablet 3   furosemide (LASIX) 20 MG tablet Take 1 tablet (20 mg total) by mouth daily. 90 tablet 3   lisinopril (ZESTRIL) 5 MG tablet TAKE ONE TABLET BY MOUTH ONE TIME DAILY 90 tablet 3   metoprolol tartrate (LOPRESSOR) 25  MG tablet TAKE ONE TABLET BY MOUTH TWICE DAILY 180 tablet 1   nitroGLYCERIN (NITROSTAT) 0.4 MG SL tablet DISSOLVE ONE TABLET UNDER TONGUE EVERY 5 MINUTES AS NEEDED FOR CHEST PAIN 25 tablet 0   pantoprazole (PROTONIX) 40 MG tablet TAKE ONE TABLET BY MOUTH ONE TIME DAILY 90 tablet 3   pimecrolimus (ELIDEL) 1 % cream      predniSONE (DELTASONE) 20 MG tablet Take 1 tablet (20 mg total) by mouth daily with breakfast. Take for 5 days 5 tablet 0   ranolazine (RANEXA) 1000 MG SR tablet TAKE ONE TABLET BY MOUTH TWICE DAILY 180 tablet 3   REPATHA SURECLICK 117 MG/ML SOAJ inject 141m into the skin once every 14 days 2 mL 11   No current facility-administered medications on file prior to visit.    LABS/IMAGING: No results found for this or any previous visit (from the past 48 hour(s)). No results found.  LIPID PANEL:    Component Value Date/Time   CHOL 126 05/05/2022 1041   TRIG 146 05/05/2022 1041   HDL 65 05/05/2022 1041   CHOLHDL 1.9 05/05/2022 1041   CHOLHDL 3 03/22/2019 0839   VLDL 35.8 03/22/2019 0839   LDLCALC 37 05/05/2022 1041   LDLDIRECT 128.0 01/12/2017 1455    WEIGHTS: Wt Readings from Last 3 Encounters:  05/11/22 229 lb 11.2 oz (104.2 kg)  03/22/22 224 lb 9.6 oz (101.9 kg)  01/29/22 225 lb 8 oz (102.3 kg)    VITALS: BP 112/64   Pulse 62    Ht 5' 3"  (1.6 m)   Wt 229 lb 11.2 oz (104.2 kg)   SpO2 97%   BMI 40.69 kg/m   EXAM: Deferred  EKG: Deferred  ASSESSMENT: Mixed dyslipidemia, goal LDL less than 70 Statin intolerance-myalgias Coronary artery disease with occluded LAD Mitral valve disease Gout  PLAN: 1.   Ms. ZLavalaishas had significant improvement in her lipids on combination therapy with ezetimibe and Repatha.  Unfortunately she had been diagnosed with gout around the last time I saw her.  She is now on allopurinol and had improved with steroids.  She seems to be tolerating the combination therapy and her LDL is well treated.  Will repeat lipids in 6 months to ensure stability.  No changes to her meds today.  Follow-up at that time.  KPixie Casino MD, [facility] FUplandDirector of the Advanced Lipid Disorders &  Cardiovascular Risk Reduction Clinic Diplomate of the American Board of Clinical Lipidology Attending Cardiologist  Direct Dial: [phone] Fax: [phone] Website:  [email or web address] 05/11/2022, 11:26 AM

## 2022-05-11 NOTE — Patient Instructions (Signed)
Medication Instructions:  Your Physician recommend you continue on your current medication as directed.    *If you need a refill on your cardiac medications before your next appointment, please call your pharmacy*   Lab Work: Your provider has recommended lab work in April, 2024 (Lipid). Please have this collected at [facility] [place]. The lab is open 8:00 am - 4:30 pm. Please avoid 12:00p - 1:00p for lunch hour. You do not need an appointment. Please go to [address] [place], [place] [address]. This is in the Primary Care office on the 3rd floor, let them know you are there for blood work and they will direct you to the lab.' If you have labs (blood work) drawn today and your tests are completely normal, you will receive your results only by: MyChart Message (if you have MyChart) OR A paper copy in the mail If you have any lab test that is abnormal or we need to change your treatment, we will call you to review the results.   Testing/Procedures: None ordered today   Follow-Up: At [facility], you and your health needs are our priority.  As part of our continuing mission to provide you with exceptional heart care, we have created designated Provider Care Teams.  These Care Teams include your primary Cardiologist (physician) and Advanced Practice Providers (APPs -  Physician Assistants and Nurse Practitioners) who all work together to provide you with the care you need, when you need it.  We recommend signing up for the patient portal called "MyChart".  Sign up information is provided on this After Visit Summary.  MyChart is used to connect with patients for Virtual Visits (Telemedicine).  Patients are able to view lab/test results, encounter notes, upcoming appointments, etc.  Non-urgent messages can be sent to your provider as well.   To learn more about what you can do with MyChart, go to [email or web address].    Your next appointment:   6  month(s)  The format for your next appointment:   In Person  Provider:   K. Mali Hilty, MD

## 2022-05-20 ENCOUNTER — Encounter: Payer: Self-pay | Admitting: Family Medicine

## 2022-05-20 DIAGNOSIS — M79675 Pain in left toe(s): Secondary | ICD-10-CM

## 2022-05-20 MED ORDER — PREDNISONE 20 MG PO TABS: 20.0000 mg | ORAL_TABLET | Freq: Every day | ORAL | 0 refills | Status: DC

## 2022-05-25 NOTE — Progress Notes (Deleted)
[place] [facility] [address] [place], [place] [address] [phone]  Date:  05/31/2022   Name:  [name]   DOB:  05/21/1956   MRN:  [id number]  PCP:  Darreld Mclean, MD    Chief Complaint: No chief complaint on file.   History of Present Illness:  [name] is a 66 y.o. very pleasant female patient who presents with the following:  Patient seen today for concern of gout- History of HTN, CAD/NSTEMI, obesity, hyperlipidemia, IBS, hypothyroidism, GI bleed last year    Most recent visit with myself was in August, also for gout She recently had a uric acid elevated at 9.4, currently taking allopurinol  Recommend shingles vaccine, COVID booster, pneumonia Bone density Mammogram is due Flu shot Patient Active Problem List   Diagnosis Date Noted   Acute GI bleeding 04/17/2021   Colitis 04/17/2021   Lower abdominal pain 01/28/2021   Dyspnea on exertion 10/24/2019   Pre-diabetes 01/14/2017   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   Tachycardia, somewhat irregular, episodic 10/08/2013   Dizziness 10/08/2013   Angina effort 05/22/2013   Unstable angina ([place]) 01/22/2013   Hyperlipidemia 01/01/2013   CAD (coronary artery disease), possible SCAD 12/14/2012   NSTEMI (non-ST elevated myocardial infarction) ([place]) 12/10/2012   Essential hypertension 12/10/2012   Hypothyroidism 12/10/2012   Obesity (BMI 35.0-39.9 without comorbidity) 12/10/2012   Gastroenteritis 10/08/2011   Acute ischemic colitis ([place]) 08/09/2011   DYSPHAGIA UNSPECIFIED 07/01/2010   RECTAL BLEEDING Jan 2013 11/04/2009   GERD 02/11/2009   Irritable bowel syndrome 02/11/2009   Generalized abdominal pain 02/11/2009   PERSONAL HX COLONIC POLYPS 02/11/2009    Past Medical History:  Diagnosis Date   Angina effort 05/22/2013   Anxiety    CAD (coronary artery disease), possible SCAD 12/14/2012   Colon polyps    Duodenitis     Dyspnea on exertion    LEXISCAN, 09/30/2008 - normal, EKG negative for ischemia, no ECG changes   Esophageal stricture    Gastritis    GERD (gastroesophageal reflux disease)    Gout    "only from RX given after MI" (01/22/2013)   H/O hiatal hernia    History of esophageal stricture    Hypertension    2D ECHO, 09/30/2008 - EF >55%, normal: RENAL DOPPLER, 03/21/2007 - normal duplex study   Hypothyroidism    IBS (irritable bowel syndrome)    Ischemic colitis ([place])    Lower GI bleeding    10/07/11 "I've had 3 episodes in the past year"   Myocardial infarction ([place]) 12/10/2012   "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)   Tubular adenoma polyp of rectum 2008    Past Surgical History:  Procedure Laterality Date   ABDOMINAL HYSTERECTOMY  1999   ANTERIOR LUMBAR FUSION  2010   "L4-5" (01/22/2013)   CARDIAC CATHETERIZATION N/A 02/06/2015   Procedure: Left Heart Cath and Coronary Angiography;  Surgeon: Leonie Man, MD;  Location: [facility];  Service: Cardiovascular;  Laterality: N/A;   ESOPHAGEAL DILATION  08/2010   ESOPHAGEAL DILATION     "once or twice" (01/22/2013)   HAMMER TOE SURGERY Bilateral ~ 2002   LEFT HEART CATHETERIZATION WITH CORONARY ANGIOGRAM N/A 12/11/2012   Procedure: LEFT HEART CATHETERIZATION WITH CORONARY ANGIOGRAM;  Surgeon: Leonie Man, MD;  Location: [facility] LAB;  Service: Cardiovascular;  Laterality: N/A;   LEFT HEART CATHETERIZATION WITH CORONARY ANGIOGRAM N/A 12/15/2012   Procedure:  LEFT HEART CATHETERIZATION WITH CORONARY ANGIOGRAM;  Surgeon: Leonie Man, MD;  Location: [facility] CATH LAB;  Service: Cardiovascular;  Laterality: N/A;   PERIPHERAL VENOUS STUDY  10/03/2008   No evidence of DVT, superficial thrombosis, or Baker's cyst   TONSILLECTOMY AND ADENOIDECTOMY  1960's   TUBAL LIGATION  1985    Social History   Tobacco Use   Smoking status: Never   Smokeless tobacco: Never  Vaping Use   Vaping Use: Never used  Substance Use Topics    Alcohol use: No   Drug use: No    Family History  Problem Relation Age of Onset   Colon cancer Brother 57   Kidney disease Mother    Stroke Father    Multiple myeloma Sister    Heart attack Brother    Other Brother    Other Brother     No Known Allergies  Medication list has been reviewed and updated.  Current Outpatient Medications on File Prior to Visit  Medication Sig Dispense Refill   allopurinol 200 MG TABS Take 200 mg by mouth daily. (Patient taking differently: Take 300 mg by mouth daily.) 90 tablet 3   ALPRAZolam (XANAX) 0.5 MG tablet TAKE 1 TABLET BY MOUTH 3 TIMES DAILY AS NEEDED FOR ANXIETY (Patient taking differently: Take 0.5 mg by mouth in the morning.) 90 tablet 2   amLODipine (NORVASC) 5 MG tablet Take 1 tablet (5 mg total) by mouth daily. 90 tablet 3   aspirin EC 81 MG tablet Take 81 mg by mouth daily. Swallow whole.     clobetasol (TEMOVATE) 0.05 % external solution Apply topically.     clopidogrel (PLAVIX) 75 MG tablet TAKE ONE TABLET BY MOUTH ONE TIME DAILY WITH BREAKFAST 90 tablet 0   dicyclomine (BENTYL) 10 MG capsule Take 2 capsules (20 mg total) by mouth 4 (four) times daily -  before meals and at bedtime. (Patient taking differently: Take 20 mg by mouth as needed.) 240 capsule 0   estradiol (ESTRACE) 0.5 MG tablet Take 0.5 mg by mouth daily.     ezetimibe (ZETIA) 10 MG tablet Take 1 tablet (10 mg total) by mouth daily. 90 tablet 3   furosemide (LASIX) 20 MG tablet Take 1 tablet (20 mg total) by mouth daily. 90 tablet 3   lisinopril (ZESTRIL) 5 MG tablet TAKE ONE TABLET BY MOUTH ONE TIME DAILY 90 tablet 3   metoprolol tartrate (LOPRESSOR) 25 MG tablet TAKE ONE TABLET BY MOUTH TWICE DAILY 180 tablet 1   nitroGLYCERIN (NITROSTAT) 0.4 MG SL tablet DISSOLVE ONE TABLET UNDER TONGUE EVERY 5 MINUTES AS NEEDED FOR CHEST PAIN 25 tablet 0   pantoprazole (PROTONIX) 40 MG tablet TAKE ONE TABLET BY MOUTH ONE TIME DAILY 90 tablet 3   pimecrolimus (ELIDEL) 1 % cream       predniSONE (DELTASONE) 20 MG tablet Take 1 tablet (20 mg total) by mouth daily with breakfast. Take for 5 days 5 tablet 0   ranolazine (RANEXA) 1000 MG SR tablet TAKE ONE TABLET BY MOUTH TWICE DAILY 180 tablet 3   REPATHA SURECLICK 616 MG/ML SOAJ inject 185m into the skin once every 14 days 2 mL 11   No current facility-administered medications on file prior to visit.    Review of Systems:  As per HPI- otherwise negative.   Physical Examination: There were no vitals filed for this visit. There were no vitals filed for this visit. There is no height or weight on file to calculate BMI. Ideal Body Weight:  GEN: no acute distress. HEENT: Atraumatic, Normocephalic.  Ears and Nose: No external deformity. CV: RRR, No M/G/R. No JVD. No thrill. No extra heart sounds. PULM: CTA B, no wheezes, crackles, rhonchi. No retractions. No resp. distress. No accessory muscle use. ABD: S, NT, ND, +BS. No rebound. No HSM. EXTR: No c/c/e PSYCH: Normally interactive. Conversant.    Assessment and Plan: ***  Signed Lamar Blinks, MD

## 2022-05-25 NOTE — Patient Instructions (Incomplete)
It was good to see you again today Please keep me posted about your gout Recommend shingles vaccine, COVID booster, pneumonia vaccine, RSV, flu

## 2022-05-26 ENCOUNTER — Ambulatory Visit: Payer: Medicare Other | Admitting: Family Medicine

## 2022-05-29 NOTE — Progress Notes (Signed)
[facility] at [facility] [address], [address] [place], [place] [address] [phone]  Date:  06/03/2022   Name:  [name]   DOB:  1956/07/15   MRN:  [id number]  PCP:  Pearline Cables, MD    Chief Complaint: Follow-up (Gout. Pt says since she is limping she thinks she is throwing her back out )   History of Present Illness:  [name] is a 66 y.o. very pleasant female patient who presents with the following:  Patient seen today with concern of gout and leg swelling- History of HTN, CAD/NSTEMI, obesity, hyperlipidemia, IBS, hypothyroidism, GI bleed last year    She contacted me earlier this month with concern of persistent gout.  She is on allopurinol. On 10/19 I called in some prednisone for her Uric acid level was 9.4 in July, 6 on recheck in September She notes that the gout seems to come and go- she cannot make any particular connection with particular foods  She did try taking 400 of allopurinol for a few days  She is taking 300 mg of allopuinol right now The gout is always in her left great toe   Echo last year showed normal EF  Seen by cardiology Dr Rennis Golden on 10/10  Shingrix Flu shot -give today  Recommend COVID booster Pneumonia vaccine- declines today  Bone density- done 2 weeks ago at [facility] Mammogram- done about 2 weeks ago Patient Active Problem List   Diagnosis Date Noted   Acute GI bleeding 04/17/2021   Colitis 04/17/2021   Lower abdominal pain 01/28/2021   Dyspnea on exertion 10/24/2019   Pre-diabetes 01/14/2017   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   Tachycardia, somewhat irregular, episodic 10/08/2013   Dizziness 10/08/2013   Angina effort 05/22/2013   Unstable angina ([facility]) 01/22/2013   Hyperlipidemia 01/01/2013   CAD (coronary artery disease), possible SCAD 12/14/2012   NSTEMI (non-ST elevated myocardial infarction) ([facility]) 12/10/2012   Essential hypertension 12/10/2012    Hypothyroidism 12/10/2012   Obesity (BMI 35.0-39.9 without comorbidity) 12/10/2012   Gastroenteritis 10/08/2011   Acute ischemic colitis ([facility]) 08/09/2011   DYSPHAGIA UNSPECIFIED 07/01/2010   RECTAL BLEEDING Jan 2013 11/04/2009   GERD 02/11/2009   Irritable bowel syndrome 02/11/2009   Generalized abdominal pain 02/11/2009   PERSONAL HX COLONIC POLYPS 02/11/2009    Past Medical History:  Diagnosis Date   Angina effort 05/22/2013   Anxiety    CAD (coronary artery disease), possible SCAD 12/14/2012   Colon polyps    Duodenitis    Dyspnea on exertion    LEXISCAN, 09/30/2008 - normal, EKG negative for ischemia, no ECG changes   Esophageal stricture    Gastritis    GERD (gastroesophageal reflux disease)    Gout    "only from RX given after MI" (01/22/2013)   H/O hiatal hernia    History of esophageal stricture    Hypertension    2D ECHO, 09/30/2008 - EF >55%, normal: RENAL DOPPLER, 03/21/2007 - normal duplex study   Hypothyroidism    IBS (irritable bowel syndrome)    Ischemic colitis ([facility])    Lower GI bleeding    10/07/11 "I've had 3 episodes in the past year"   Myocardial infarction [facility]) 12/10/2012   "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)   Tubular adenoma polyp of rectum 2008    Past Surgical History:  Procedure Laterality Date   ABDOMINAL HYSTERECTOMY  1999   ANTERIOR LUMBAR FUSION  2010   "  L4-5" (01/22/2013)   CARDIAC CATHETERIZATION N/A 02/06/2015   Procedure: Left Heart Cath and Coronary Angiography;  Surgeon: Marykay Lex, MD;  Location: [facility] INVASIVE CV LAB;  Service: Cardiovascular;  Laterality: N/A;   ESOPHAGEAL DILATION  08/2010   ESOPHAGEAL DILATION     "once or twice" (01/22/2013)   HAMMER TOE SURGERY Bilateral ~ 2002   LEFT HEART CATHETERIZATION WITH CORONARY ANGIOGRAM N/A 12/11/2012   Procedure: LEFT HEART CATHETERIZATION WITH CORONARY ANGIOGRAM;  Surgeon: Marykay Lex, MD;  Location: [facility] CATH LAB;  Service: Cardiovascular;  Laterality: N/A;   LEFT  HEART CATHETERIZATION WITH CORONARY ANGIOGRAM N/A 12/15/2012   Procedure: LEFT HEART CATHETERIZATION WITH CORONARY ANGIOGRAM;  Surgeon: Marykay Lex, MD;  Location: [facility] CATH LAB;  Service: Cardiovascular;  Laterality: N/A;   PERIPHERAL VENOUS STUDY  10/03/2008   No evidence of DVT, superficial thrombosis, or Baker's cyst   TONSILLECTOMY AND ADENOIDECTOMY  1960's   TUBAL LIGATION  1985    Social History   Tobacco Use   Smoking status: Never   Smokeless tobacco: Never  Vaping Use   Vaping Use: Never used  Substance Use Topics   Alcohol use: No   Drug use: No    Family History  Problem Relation Age of Onset   Colon cancer Brother 74   Kidney disease Mother    Stroke Father    Multiple myeloma Sister    Heart attack Brother    Other Brother    Other Brother     No Known Allergies  Medication list has been reviewed and updated.  Current Outpatient Medications on File Prior to Visit  Medication Sig Dispense Refill   allopurinol 200 MG TABS Take 200 mg by mouth daily. (Patient taking differently: Take 300 mg by mouth daily.) 90 tablet 3   ALPRAZolam (XANAX) 0.5 MG tablet TAKE 1 TABLET BY MOUTH 3 TIMES DAILY AS NEEDED FOR ANXIETY (Patient taking differently: Take 0.5 mg by mouth in the morning.) 90 tablet 2   amLODipine (NORVASC) 5 MG tablet Take 1 tablet (5 mg total) by mouth daily. 90 tablet 3   aspirin EC 81 MG tablet Take 81 mg by mouth daily. Swallow whole.     clobetasol (TEMOVATE) 0.05 % external solution Apply topically.     clopidogrel (PLAVIX) 75 MG tablet TAKE ONE TABLET BY MOUTH ONE TIME DAILY WITH BREAKFAST 90 tablet 0   dicyclomine (BENTYL) 10 MG capsule Take 2 capsules (20 mg total) by mouth 4 (four) times daily -  before meals and at bedtime. (Patient taking differently: Take 20 mg by mouth as needed.) 240 capsule 0   estradiol (ESTRACE) 0.5 MG tablet Take 0.5 mg by mouth daily.     ezetimibe (ZETIA) 10 MG tablet Take 1 tablet (10 mg total) by mouth daily. 90  tablet 3   furosemide (LASIX) 20 MG tablet Take 1 tablet (20 mg total) by mouth daily. 90 tablet 3   lisinopril (ZESTRIL) 5 MG tablet TAKE ONE TABLET BY MOUTH ONE TIME DAILY 90 tablet 3   metoprolol tartrate (LOPRESSOR) 25 MG tablet TAKE ONE TABLET BY MOUTH TWICE DAILY 180 tablet 1   nitroGLYCERIN (NITROSTAT) 0.4 MG SL tablet DISSOLVE ONE TABLET UNDER TONGUE EVERY 5 MINUTES AS NEEDED FOR CHEST PAIN 25 tablet 0   pantoprazole (PROTONIX) 40 MG tablet TAKE ONE TABLET BY MOUTH ONE TIME DAILY 90 tablet 3   pimecrolimus (ELIDEL) 1 % cream      predniSONE (DELTASONE) 20 MG tablet Take 1  tablet (20 mg total) by mouth daily with breakfast. Take for 5 days 5 tablet 0   ranolazine (RANEXA) 1000 MG SR tablet TAKE ONE TABLET BY MOUTH TWICE DAILY 180 tablet 3   REPATHA SURECLICK 140 MG/ML SOAJ inject 140mg  into the skin once every 14 days 2 mL 11   No current facility-administered medications on file prior to visit.    Review of Systems:  As per HPI- otherwise negative.   Physical Examination: Vitals:   06/03/22 0841  BP: 110/70  Pulse: 77  Resp: 18  Temp: 97.8 F (36.6 C)  SpO2: 98%   Vitals:   06/03/22 0841  Height: 5\' 3"  (1.6 m)   Body mass index is 40.69 kg/m. Ideal Body Weight: Weight in (lb) to have BMI = 25: 140.8  GEN: no acute distress. HEENT: Atraumatic, Normocephalic.  Ears and Nose: No external deformity. CV: RRR, No M/G/R. No JVD. No thrill. No extra heart sounds. PULM: CTA B, no wheezes, crackles, rhonchi. No retractions. No resp. distress. No accessory muscle use. ABD: S, NT, ND, +BS. No rebound. No HSM. EXTR: No c/c/e PSYCH: Normally interactive. Conversant.  Left foot- normal pulses, normal exam.  No redness or swelling.   Pt notes her left calf will swell a bit more than the right during the day for about one year - no tenderness or cords   Assessment and Plan: Chronic idiopathic gout involving toe of left foot without tophus - Plan: Uric acid, Basic metabolic  panel  Essential hypertension - Plan: Basic metabolic panel  Pre-diabetes - Plan: Hemoglobin A1c  Continued issues with intermittent gout pain Check uric acid today- she is taking 300 allopurinol May need to increase dose vs change to colchicine vs add prn colchicine.  She is not on a statin  BP under good control continue current medication Follow-up on renal function, A1c today  Flu shot Encourage other immun that are due  Request GYN records  Signed Abbe Amsterdam, MD  Received her labs as below- message to pt  Results for orders placed or performed in visit on 06/03/22  Uric acid  Result Value Ref Range   Uric Acid, Serum 4.8 2.4 - 7.0 mg/dL  Basic metabolic panel  Result Value Ref Range   Sodium 137 135 - 145 mEq/L   Potassium 4.5 3.5 - 5.1 mEq/L   Chloride 102 96 - 112 mEq/L   CO2 28 19 - 32 mEq/L   Glucose, Bld 96 70 - 99 mg/dL   BUN 19 6 - 23 mg/dL   Creatinine, Ser 4.09 0.40 - 1.20 mg/dL   GFR 81.19 (L) >14.78 mL/min   Calcium 9.6 8.4 - 10.5 mg/dL  Hemoglobin G9F  Result Value Ref Range   Hgb A1c MFr Bld 5.8 4.6 - 6.5 %

## 2022-05-29 NOTE — Patient Instructions (Incomplete)
It was good to see you again today Recommend flu shot- done today!, pneumonia vaccine if not done already, COVID booster, shingles vaccine, RSV  I will be in touch with your uric acid and we will figure out the next step- increase allopurinol vs start colchicine  Will request records from your GYN doc

## 2022-05-31 ENCOUNTER — Ambulatory Visit: Payer: Medicare Other | Admitting: Family Medicine

## 2022-06-03 ENCOUNTER — Encounter: Payer: Self-pay | Admitting: Family Medicine

## 2022-06-03 ENCOUNTER — Ambulatory Visit (INDEPENDENT_AMBULATORY_CARE_PROVIDER_SITE_OTHER): Payer: Medicare Other | Admitting: Family Medicine

## 2022-06-03 VITALS — BP 110/70 | HR 77 | Temp 97.8°F | Resp 18 | Ht 63.0 in | Wt 227.0 lb

## 2022-06-03 DIAGNOSIS — I2510 Atherosclerotic heart disease of native coronary artery without angina pectoris: Secondary | ICD-10-CM | POA: Diagnosis not present

## 2022-06-03 DIAGNOSIS — N1831 Chronic kidney disease, stage 3a: Secondary | ICD-10-CM

## 2022-06-03 DIAGNOSIS — M1A0720 Idiopathic chronic gout, left ankle and foot, without tophus (tophi): Secondary | ICD-10-CM | POA: Diagnosis not present

## 2022-06-03 DIAGNOSIS — I10 Essential (primary) hypertension: Secondary | ICD-10-CM

## 2022-06-03 DIAGNOSIS — R7303 Prediabetes: Secondary | ICD-10-CM

## 2022-06-03 DIAGNOSIS — Z23 Encounter for immunization: Secondary | ICD-10-CM | POA: Diagnosis not present

## 2022-06-03 LAB — URIC ACID: Uric Acid, Serum: 4.8 mg/dL (ref 2.4–7.0)

## 2022-06-03 LAB — BASIC METABOLIC PANEL
BUN: 19 mg/dL (ref 6–23)
CO2: 28 mEq/L (ref 19–32)
Calcium: 9.6 mg/dL (ref 8.4–10.5)
Chloride: 102 mEq/L (ref 96–112)
Creatinine, Ser: 1.16 mg/dL (ref 0.40–1.20)
GFR: 49.24 mL/min — ABNORMAL LOW (ref >60.00)
Glucose, Bld: 96 mg/dL (ref 70–99)
Potassium: 4.5 mEq/L (ref 3.5–5.1)
Sodium: 137 mEq/L (ref 135–145)

## 2022-06-03 LAB — HEMOGLOBIN A1C: Hgb A1c MFr Bld: 5.8 % (ref 4.6–6.5)

## 2022-06-04 MED ORDER — PREDNISONE 20 MG PO TABS: ORAL_TABLET | ORAL | 1 refills | Status: DC

## 2022-06-04 NOTE — Addendum Note (Signed)
Addended by: Lamar Blinks C on: 06/04/2022 08:05 AM   Modules accepted: Orders

## 2022-06-07 ENCOUNTER — Encounter: Payer: Self-pay | Admitting: Family Medicine

## 2022-06-07 MED ORDER — ALLOPURINOL 100 MG PO TABS: 300.0000 mg | ORAL_TABLET | Freq: Every day | ORAL | 3 refills | Status: AC

## 2022-06-28 ENCOUNTER — Encounter: Payer: Self-pay | Admitting: Family Medicine

## 2022-06-28 DIAGNOSIS — F411 Generalized anxiety disorder: Secondary | ICD-10-CM

## 2022-06-28 MED ORDER — ALPRAZOLAM 0.5 MG PO TABS: ORAL_TABLET | ORAL | 1 refills | Status: DC

## 2022-06-28 NOTE — Telephone Encounter (Signed)
Okay for refill? Also, I believe this is the normal procedure.

## 2022-07-08 ENCOUNTER — Telehealth: Payer: Medicare Other | Admitting: Family Medicine

## 2022-07-09 ENCOUNTER — Ambulatory Visit (INDEPENDENT_AMBULATORY_CARE_PROVIDER_SITE_OTHER): Payer: Medicare Other | Admitting: *Deleted

## 2022-07-09 DIAGNOSIS — Z0000 Encounter for general adult medical examination without abnormal findings: Secondary | ICD-10-CM | POA: Diagnosis not present

## 2022-07-09 NOTE — Progress Notes (Signed)
Subjective:   [name] is a 66 y.o. female who presents for an Initial Medicare Annual Wellness Visit.  I connected with  [name] on 07/09/22 by a audio enabled telemedicine application and verified that I am speaking with the correct person using two identifiers.  Patient Location: Home  Provider Location: Office/Clinic  I discussed the limitations of evaluation and management by telemedicine. The patient expressed understanding and agreed to proceed.   Review of Systems    Defer to PCP Cardiac Risk Factors include: advanced age (>54mn, >>69women);obesity (BMI >30kg/m2);dyslipidemia;hypertension     Objective:    There were no vitals filed for this visit. There is no height or weight on file to calculate BMI.     07/09/2022    9:02 AM 04/17/2021    5:25 PM 04/17/2021    5:00 PM 04/17/2021    3:42 AM 07/31/2016    4:35 PM 06/26/2015    1:27 PM 02/06/2015   11:33 AM  Advanced Directives  Does Patient Have a Medical Advance Directive? Yes   Yes No Yes Yes  Type of AP[place]amed[place]of [place] will     [place] will Living will;Healthcare Power of Attorney  Does patient want to make changes to medical advance directive? No - Patient declined No - Patient declined No - Patient declined No - Patient declined     Copy of [place] Chart? No - copy requested      No - copy requested  Would patient like information on creating a medical advance directive?     No - Patient declined      Current Medications (verified) Outpatient Encounter Medications as of 07/09/2022  Medication Sig   allopurinol (ZYLOPRIM) 100 MG tablet Take 3 tablets (300 mg total) by mouth daily.   ALPRAZolam (XANAX) 0.5 MG tablet TAKE 1 TABLET BY MOUTH 3 TIMES DAILY AS NEEDED FOR ANXIETY   amLODipine (NORVASC) 5 MG tablet Take 1 tablet (5 mg total) by mouth daily.   aspirin EC 81 MG tablet Take 81 mg by mouth daily.  Swallow whole.   clobetasol (TEMOVATE) 0.05 % external solution Apply topically.   clopidogrel (PLAVIX) 75 MG tablet TAKE ONE TABLET BY MOUTH ONE TIME DAILY WITH BREAKFAST   dicyclomine (BENTYL) 10 MG capsule Take 2 capsules (20 mg total) by mouth 4 (four) times daily -  before meals and at bedtime. (Patient taking differently: Take 20 mg by mouth as needed.)   estradiol (ESTRACE) 0.5 MG tablet Take 0.5 mg by mouth daily.   ezetimibe (ZETIA) 10 MG tablet Take 1 tablet (10 mg total) by mouth daily.   furosemide (LASIX) 20 MG tablet Take 1 tablet (20 mg total) by mouth daily.   lisinopril (ZESTRIL) 5 MG tablet TAKE ONE TABLET BY MOUTH ONE TIME DAILY   metoprolol tartrate (LOPRESSOR) 25 MG tablet TAKE ONE TABLET BY MOUTH TWICE DAILY   nitroGLYCERIN (NITROSTAT) 0.4 MG SL tablet DISSOLVE ONE TABLET UNDER TONGUE EVERY 5 MINUTES AS NEEDED FOR CHEST PAIN   pantoprazole (PROTONIX) 40 MG tablet TAKE ONE TABLET BY MOUTH ONE TIME DAILY   pimecrolimus (ELIDEL) 1 % cream    predniSONE (DELTASONE) 20 MG tablet Take one by mouth daily for 3-5 days as needed for gout flare   ranolazine (RANEXA) 1000 MG SR tablet TAKE ONE TABLET BY MOUTH TWICE DAILY   REPATHA SURECLICK 1789MG/ML SOAJ inject 1471minto the skin once every 14  days   No facility-administered encounter medications on file as of 07/09/2022.    Allergies (verified) Patient has no known allergies.   History: Past Medical History:  Diagnosis Date   Angina effort 05/22/2013   Anxiety    CAD (coronary artery disease), possible SCAD 12/14/2012   Colon polyps    Duodenitis    Dyspnea on exertion    LEXISCAN, 09/30/2008 - normal, EKG negative for ischemia, no ECG changes   Esophageal stricture    Gastritis    GERD (gastroesophageal reflux disease)    Gout    "only from RX given after MI" (01/22/2013)   H/O hiatal hernia    History of esophageal stricture    Hypertension    2D ECHO, 09/30/2008 - EF >55%, normal: RENAL DOPPLER, 03/21/2007 - normal  duplex study   Hypothyroidism    IBS (irritable bowel syndrome)    Ischemic colitis ([place])    Lower GI bleeding    10/07/11 "I've had 3 episodes in the past year"   Myocardial infarction ([place]) 12/10/2012   "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)   Tubular adenoma polyp of rectum 2008   Past Surgical History:  Procedure Laterality Date   ABDOMINAL HYSTERECTOMY  1999   ANTERIOR LUMBAR FUSION  2010   "L4-5" (01/22/2013)   CARDIAC CATHETERIZATION N/A 02/06/2015   Procedure: Left Heart Cath and Coronary Angiography;  Surgeon: Leonie Man, MD;  Location: [facility];  Service: Cardiovascular;  Laterality: N/A;   ESOPHAGEAL DILATION  08/2010   ESOPHAGEAL DILATION     "once or twice" (01/22/2013)   HAMMER TOE SURGERY Bilateral ~ 2002   LEFT HEART CATHETERIZATION WITH CORONARY ANGIOGRAM N/A 12/11/2012   Procedure: LEFT HEART CATHETERIZATION WITH CORONARY ANGIOGRAM;  Surgeon: Leonie Man, MD;  Location: [facility] CATH LAB;  Service: Cardiovascular;  Laterality: N/A;   LEFT HEART CATHETERIZATION WITH CORONARY ANGIOGRAM N/A 12/15/2012   Procedure: LEFT HEART CATHETERIZATION WITH CORONARY ANGIOGRAM;  Surgeon: Leonie Man, MD;  Location: [facility];  Service: Cardiovascular;  Laterality: N/A;   PERIPHERAL VENOUS STUDY  10/03/2008   No evidence of DVT, superficial thrombosis, or Baker's cyst   TONSILLECTOMY AND ADENOIDECTOMY  1960's   TUBAL LIGATION  1985   Family History  Problem Relation Age of Onset   Colon cancer Brother 40   Kidney disease Mother    Stroke Father    Multiple myeloma Sister    Heart attack Brother    Other Brother    Other Brother    Social History   Socioeconomic History   Marital status: Single    Spouse name: Not on file   Number of children: 2   Years of education: Not on file   Highest education level: Not on file  Occupational History   Occupation: Drug enforcement    Comment: Surveyor, minerals  Tobacco Use   Smoking status: Never    Smokeless tobacco: Never  Vaping Use   Vaping Use: Never used  Substance and Sexual Activity   Alcohol use: No   Drug use: No   Sexual activity: Not Currently  Other Topics Concern   Not on file  Social History Narrative   Daily caffeine use.   Social Determinants of Health   Financial Resource Strain: Low Risk  (07/09/2022)   Overall Financial Resource Strain (CARDIA)    Difficulty of Paying Living Expenses: Not hard at all  Food Insecurity: No Food Insecurity (07/09/2022)   Hunger Vital Sign  Worried About Charity fundraiser in the Last Year: Never true    [place] in the Last Year: Never true  Transportation Needs: No Transportation Needs (07/09/2022)   PRAPARE - Hydrologist (Medical): No    Lack of Transportation (Non-Medical): No  Physical Activity: Inactive (07/09/2022)   Exercise Vital Sign    Days of Exercise per Week: 0 days    Minutes of Exercise per Session: 0 min  Stress: Stress Concern Present (07/09/2022)   [place]    Feeling of Stress : To some extent  Social Connections: Moderately Integrated (07/09/2022)   Social Connection and Isolation Panel [NHANES]    Frequency of Communication with Friends and Family: More than three times a week    Frequency of Social Gatherings with Friends and Family: Three times a week    Attends Religious Services: More than 4 times per year    Active Member of Clubs or Organizations: Yes    Attends Music therapist: More than 4 times per year    Marital Status: Divorced    Tobacco Counseling Counseling given: Not Answered   Clinical Intake:  Pre-visit preparation completed: Yes  Pain : No/denies pain  Diabetes: No  How often do you need to have someone help you when you read instructions, pamphlets, or other written materials from your doctor or pharmacy?: 1 - Never  Activities of Daily Living     07/09/2022    9:10 AM  In your present state of health, do you have any difficulty performing the following activities:  Hearing? 0  Vision? 0  Difficulty concentrating or making decisions? 0  Walking or climbing stairs? 1  Dressing or bathing? 0  Doing errands, shopping? 0  Preparing Food and eating ? N  Using the Toilet? N  In the past six months, have you accidently leaked urine? Y  Comment stress incontinence, wears pads  Do you have problems with loss of bowel control? N  Managing your Medications? N  Managing your Finances? N  Housekeeping or managing your Housekeeping? N    Patient Care Team: Copland, Gay Filler, MD as PCP - General (Family Medicine) Lorretta Harp, MD as PCP - Cardiology (Cardiology) Maisie Fus, MD (Inactive) as Consulting Physician (Obstetrics and Gynecology) Irene Shipper, MD as Consulting Physician (Gastroenterology) Debara Pickett Nadean Corwin, MD as Consulting Physician (Cardiology)  Indicate any recent Medical Services you may have received from other than [facility] providers in the past year (date may be approximate).     Assessment:   This is a routine wellness examination for Sarah.  Hearing/Vision screen No results found.  Dietary issues and exercise activities discussed: Current Exercise Habits: The patient does not participate in regular exercise at present, Exercise limited by: cardiac condition(s)   Goals Addressed   None    Depression Screen    07/09/2022    9:06 AM 06/03/2022    8:45 AM 04/07/2021    4:13 PM 11/15/2016   11:05 AM  PHQ 2/9 Scores  PHQ - 2 Score 2 0 0 0    Fall Risk    07/09/2022    9:02 AM 06/03/2022    8:45 AM 04/07/2021    4:13 PM 11/11/2016    9:35 AM  Fall Risk   Falls in the past year? 0 0 0 No  Number falls in past yr: 0 0 0   Injury with  Fall? 0 0 0   Risk for fall due to : No Fall Risks     Follow up Falls evaluation completed Falls evaluation completed Falls evaluation completed     [place]:  Any stairs in or around the home? No  If so, are there any without handrails? No  Home free of loose throw rugs in walkways, pet beds, electrical cords, etc? Yes  Adequate lighting in your home to reduce risk of falls? Yes   ASSISTIVE DEVICES UTILIZED TO PREVENT FALLS:  Life alert? No  Use of a cane, walker or w/c? No  Grab bars in the bathroom? No  Shower chair or bench in shower? No  Elevated toilet seat or a handicapped toilet?  Comfort height  TIMED UP AND GO:  Was the test performed?  No, audio visit .    Cognitive Function:        07/09/2022    9:15 AM  6CIT Screen  What Year? 0 points  What month? 0 points  What time? 0 points  Count back from 20 0 points  Months in reverse 0 points  Repeat phrase 0 points  Total Score 0 points    Immunizations Immunization History  Administered Date(s) Administered   Fluad Quad(high Dose 65+) 04/20/2021, 06/03/2022   PFIZER(Purple Top)SARS-COV-2 Vaccination 10/16/2019, 11/06/2019, 04/02/2020   Tdap 06/26/2015    TDAP status: Up to date  Flu Vaccine status: Up to date  Pneumococcal vaccine status: Due, Education has been provided regarding the importance of this vaccine. Advised may receive this vaccine at local pharmacy or Health Dept. Aware to provide a copy of the vaccination record if obtained from local pharmacy or Health Dept. Verbalized acceptance and understanding.  Covid-19 vaccine status: Information provided on how to obtain vaccines.   Qualifies for Shingles Vaccine? Yes   Zostavax completed No   Shingrix Completed?: No.    Education has been provided regarding the importance of this vaccine. Patient has been advised to call insurance company to determine out of pocket expense if they have not yet received this vaccine. Advised may also receive vaccine at local pharmacy or Health Dept. Verbalized acceptance and understanding.  Screening Tests Health Maintenance  Topic Date Due    Medicare Annual Wellness (AWV)  Never done   Zoster Vaccines- Shingrix (1 of 2) Never done   Pneumonia Vaccine 51+ Years old (1 - PCV) Never done   COVID-19 Vaccine (4 - 2023-24 season) 04/02/2022   COLONOSCOPY (Pts 45-82yr Insurance coverage will need to be confirmed)  03/18/2023   MAMMOGRAM  05/10/2024   DTaP/Tdap/Td (2 - Td or Tdap) 06/25/2025   DEXA SCAN  Completed   Hepatitis C Screening  Completed   HPV VACCINES  Aged Out   INFLUENZA VACCINE  Discontinued    Health Maintenance  Health Maintenance Due  Topic Date Due   Medicare Annual Wellness (AWV)  Never done   Zoster Vaccines- Shingrix (1 of 2) Never done   Pneumonia Vaccine 66 Years old (1 - PCV) Never done   COVID-19 Vaccine (4 - 2023-24 season) 04/02/2022    Colorectal cancer screening: Type of screening: Colonoscopy. Completed 03/17/18. Repeat every 5 years  Mammogram status: Completed 05/10/22. Repeat every year  Bone Density status: Completed 05/10/22. Results reflect: Bone density results: NORMAL. Repeat every 2 years.  Lung Cancer Screening: (Low Dose CT Chest recommended if Age 66-80years, 30 pack-year currently smoking OR have quit w/in 15years.) does not qualify.  Additional Screening:  Hepatitis C Screening: does qualify; Completed 04/06/18  Vision Screening: Recommended annual ophthalmology exams for early detection of glaucoma and other disorders of the eye. Is the patient up to date with their annual eye exam?  Yes  Who is the provider or what is the name of the office in which the patient attends annual eye exams? [facility] If pt is not established with a provider, would they like to be referred to a provider to establish care? No .   Dental Screening: Recommended annual dental exams for proper oral hygiene  Community Resource Referral / Chronic Care Management: CRR required this visit?  No   CCM required this visit?  No      Plan:     I have personally reviewed and noted the following  in the patient's chart:   Medical and social history Use of alcohol, tobacco or illicit drugs  Current medications and supplements including opioid prescriptions. Patient is not currently taking opioid prescriptions. Functional ability and status Nutritional status Physical activity Advanced directives List of other physicians Hospitalizations, surgeries, and ER visits in previous 12 months Vitals Screenings to include cognitive, depression, and falls Referrals and appointments  In addition, I have reviewed and discussed with patient certain preventive protocols, quality metrics, and best practice recommendations. A written personalized care plan for preventive services as well as general preventive health recommendations were provided to patient.   Due to this being a telephonic visit, the after visit summary with patients personalized plan was offered to patient via mail or my-chart. Patient would like to access on my-chart.  Beatris Ship, [place]   07/09/2022   Nurse Notes: None

## 2022-07-09 NOTE — Patient Instructions (Signed)
[name] , Thank you for taking time to come for your Medicare Wellness Visit. I appreciate your ongoing commitment to your health goals. Please review the following plan we discussed and let me know if I can assist you in the future.   These are the goals we discussed:  Goals   None     This is a list of the screening recommended for you and due dates:  Health Maintenance  Topic Date Due   Zoster (Shingles) Vaccine (1 of 2) Never done   Pneumonia Vaccine (1 - PCV) Never done   COVID-19 Vaccine (4 - 2023-24 season) 04/02/2022   Colon Cancer Screening  03/18/2023   Medicare Annual Wellness Visit  07/10/2023   Mammogram  05/10/2024   DTaP/Tdap/Td vaccine (2 - Td or Tdap) 06/25/2025   DEXA scan (bone density measurement)  Completed   Hepatitis C Screening: USPSTF Recommendation to screen - Ages 34-79 yo.  Completed   HPV Vaccine  Aged Out   Flu Shot  Discontinued     Next appointment: Follow up in one year for your annual wellness visit.   Preventive Care 23 Years and Older, Female Preventive care refers to lifestyle choices and visits with your health care provider that can promote health and wellness. What does preventive care include? A yearly physical exam. This is also called an annual well check. Dental exams once or twice a year. Routine eye exams. Ask your health care provider how often you should have your eyes checked. Personal lifestyle choices, including: Daily care of your teeth and gums. Regular physical activity. Eating a healthy diet. Avoiding tobacco and drug use. Limiting alcohol use. Practicing safe sex. Taking low-dose aspirin every day. Taking vitamin and mineral supplements as recommended by your health care provider. What happens during an annual well check? The services and screenings done by your health care provider during your annual well check will depend on your age, overall health, lifestyle risk factors, and family history of  disease. Counseling  Your health care provider may ask you questions about your: Alcohol use. Tobacco use. Drug use. Emotional well-being. Home and relationship well-being. Sexual activity. Eating habits. History of falls. Memory and ability to understand (cognition). Work and work Statistician. Reproductive health. Screening  You may have the following tests or measurements: Height, weight, and BMI. Blood pressure. Lipid and cholesterol levels. These may be checked every 5 years, or more frequently if you are over 80 years old. Skin check. Lung cancer screening. You may have this screening every year starting at age 65 if you have a 30-pack-year history of smoking and currently smoke or have quit within the past 15 years. Fecal occult blood test (FOBT) of the stool. You may have this test every year starting at age 11. Flexible sigmoidoscopy or colonoscopy. You may have a sigmoidoscopy every 5 years or a colonoscopy every 10 years starting at age 17. Hepatitis C blood test. Hepatitis B blood test. Sexually transmitted disease (STD) testing. Diabetes screening. This is done by checking your blood sugar (glucose) after you have not eaten for a while (fasting). You may have this done every 1-3 years. Bone density scan. This is done to screen for osteoporosis. You may have this done starting at age 32. Mammogram. This may be done every 1-2 years. Talk to your health care provider about how often you should have regular mammograms. Talk with your health care provider about your test results, treatment options, and if necessary, the need for more  tests. Vaccines  Your health care provider may recommend certain vaccines, such as: Influenza vaccine. This is recommended every year. Tetanus, diphtheria, and acellular pertussis (Tdap, Td) vaccine. You may need a Td booster every 10 years. Zoster vaccine. You may need this after age 52. Pneumococcal 13-valent conjugate (PCV13) vaccine. One  dose is recommended after age 41. Pneumococcal polysaccharide (PPSV23) vaccine. One dose is recommended after age 63. Talk to your health care provider about which screenings and vaccines you need and how often you need them. This information is not intended to replace advice given to you by your health care provider. Make sure you discuss any questions you have with your health care provider. Document Released: 08/15/2015 Document Revised: 04/07/2016 Document Reviewed: 05/20/2015 Elsevier Interactive Patient Education  2017 [place] Prevention in the Home Falls can cause injuries. They can happen to people of all ages. There are many things you can do to make your home safe and to help prevent falls. What can I do on the outside of my home? Regularly fix the edges of walkways and driveways and fix any cracks. Remove anything that might make you trip as you walk through a door, such as a raised step or threshold. Trim any bushes or trees on the path to your home. Use bright outdoor lighting. Clear any walking paths of anything that might make someone trip, such as rocks or tools. Regularly check to see if handrails are loose or broken. Make sure that both sides of any steps have handrails. Any raised decks and porches should have guardrails on the edges. Have any leaves, snow, or ice cleared regularly. Use sand or salt on walking paths during winter. Clean up any spills in your garage right away. This includes oil or grease spills. What can I do in the bathroom? Use night lights. Install grab bars by the toilet and in the tub and shower. Do not use towel bars as grab bars. Use non-skid mats or decals in the tub or shower. If you need to sit down in the shower, use a plastic, non-slip stool. Keep the floor dry. Clean up any water that spills on the floor as soon as it happens. Remove soap buildup in the tub or shower regularly. Attach bath mats securely with double-sided  non-slip rug tape. Do not have throw rugs and other things on the floor that can make you trip. What can I do in the bedroom? Use night lights. Make sure that you have a light by your bed that is easy to reach. Do not use any sheets or blankets that are too big for your bed. They should not hang down onto the floor. Have a firm chair that has side arms. You can use this for support while you get dressed. Do not have throw rugs and other things on the floor that can make you trip. What can I do in the kitchen? Clean up any spills right away. Avoid walking on wet floors. Keep items that you use a lot in easy-to-reach places. If you need to reach something above you, use a strong step stool that has a grab bar. Keep electrical cords out of the way. Do not use floor polish or wax that makes floors slippery. If you must use wax, use non-skid floor wax. Do not have throw rugs and other things on the floor that can make you trip. What can I do with my stairs? Do not leave any items on the stairs. Make sure  that there are handrails on both sides of the stairs and use them. Fix handrails that are broken or loose. Make sure that handrails are as long as the stairways. Check any carpeting to make sure that it is firmly attached to the stairs. Fix any carpet that is loose or worn. Avoid having throw rugs at the top or bottom of the stairs. If you do have throw rugs, attach them to the floor with carpet tape. Make sure that you have a light switch at the top of the stairs and the bottom of the stairs. If you do not have them, ask someone to add them for you. What else can I do to help prevent falls? Wear shoes that: Do not have high heels. Have rubber bottoms. Are comfortable and fit you well. Are closed at the toe. Do not wear sandals. If you use a stepladder: Make sure that it is fully opened. Do not climb a closed stepladder. Make sure that both sides of the stepladder are locked into place. Ask  someone to hold it for you, if possible. Clearly mark and make sure that you can see: Any grab bars or handrails. First and last steps. Where the edge of each step is. Use tools that help you move around (mobility aids) if they are needed. These include: Canes. Walkers. Scooters. Crutches. Turn on the lights when you go into a dark area. Replace any light bulbs as soon as they burn out. Set up your furniture so you have a clear path. Avoid moving your furniture around. If any of your floors are uneven, fix them. If there are any pets around you, be aware of where they are. Review your medicines with your doctor. Some medicines can make you feel dizzy. This can increase your chance of falling. Ask your doctor what other things that you can do to help prevent falls. This information is not intended to replace advice given to you by your health care provider. Make sure you discuss any questions you have with your health care provider. Document Released: 05/15/2009 Document Revised: 12/25/2015 Document Reviewed: 08/23/2014 Elsevier Interactive Patient Education  2017 [employer].

## 2022-08-03 ENCOUNTER — Encounter: Payer: Self-pay | Admitting: Internal Medicine

## 2022-08-06 ENCOUNTER — Other Ambulatory Visit: Payer: Self-pay | Admitting: Cardiovascular Disease

## 2022-08-10 ENCOUNTER — Encounter (INDEPENDENT_AMBULATORY_CARE_PROVIDER_SITE_OTHER): Payer: Medicare Other | Admitting: Family Medicine

## 2022-08-10 DIAGNOSIS — J209 Acute bronchitis, unspecified: Secondary | ICD-10-CM

## 2022-08-11 MED ORDER — DOXYCYCLINE HYCLATE 100 MG PO CAPS: 100.0000 mg | ORAL_CAPSULE | Freq: Two times a day (BID) | ORAL | 0 refills | Status: DC

## 2022-08-11 NOTE — Addendum Note (Signed)
Addended by: Lamar Blinks C on: 08/11/2022 12:24 PM   Modules accepted: Orders

## 2022-08-11 NOTE — Telephone Encounter (Signed)
Please see the MyChart message reply(ies) for my assessment and plan.  The patient gave consent for this Medical Advice Message and is aware that it may result in a bill to their insurance company as well as the possibility that this may result in a co-payment or deductible. They are an established patient, but are not seeking medical advice exclusively about a problem treated during an in person or video visit in the last 7 days. I did not recommend an in person or video visit within 7 days of my reply.  I spent a total of 10 minutes cumulative time within 7 days through [employer] Lamar Blinks, MD  Called pt- 1/10 She notes illness for about a week Her main sx are wheezing and chest congestion, she notes a "cough that is just so dry" She also notes sinus congestion and pressure She has prednisone on hand for her gout flares Recommended that she take 40 mg pred daily for 3 days then 20 mg pred by mouth daily for 3 days  Called in doxycycline No facial rash but she is itching, no significant swelling Recommended that she add Benadryl as needed, prednisone should also help  I offered to get her seen in person, for the time being she declines.  However, she will be sure to seek care if not improving over the next day or 2

## 2022-08-13 ENCOUNTER — Encounter: Payer: Self-pay | Admitting: Cardiovascular Disease

## 2022-09-02 ENCOUNTER — Other Ambulatory Visit (HOSPITAL_COMMUNITY): Payer: Self-pay

## 2022-09-02 NOTE — Progress Notes (Signed)
[facility], [place], [place] [address] [phone]  Date:  09/06/2022   Name:  [name]   DOB:  1956/01/05   MRN:  [id number]  PCP:  Darreld Mclean, MD    Chief Complaint: Sore Throat (On the sides of throat /Spot in the middle )   History of Present Illness:  [name] is a 67 y.o. very pleasant female patient who presents with the following:  Pt seen today with concern of "swelling" in her throat-however, on interview this is more of a sore throat Last seen by myself in November  History of HTN, CAD/NSTEMI, obesity, hyperlipidemia, IBS, hypothyroidism, GI bleed last year   She has noted tender glands in her neck- she felt like they are visible to others- a friend noted that her left cervical nodes seem to enlarge She has noted a ST for about 2 weeks; she has not otherwise felt sick.  She does not think she has strep throat  It is about time for her esophageal dilation- however her current ST is not causing any difficulty with swallowing  She saw nephrology this am to establish care for chronic moderate renal insufficiency She is taking protonix  She does notice more reflux as of late  She has noted some cough but no other particular symptoms- she tried a benadryl a couple of times  Patient Active Problem List   Diagnosis Date Noted   Acute GI bleeding 04/17/2021   Colitis 04/17/2021   Lower abdominal pain 01/28/2021   Dyspnea on exertion 10/24/2019   Pre-diabetes 01/14/2017   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   Tachycardia, somewhat irregular, episodic 10/08/2013   Dizziness 10/08/2013   Angina effort 05/22/2013   Unstable angina ([place]) 01/22/2013   Hyperlipidemia 01/01/2013   CAD (coronary artery disease), possible SCAD 12/14/2012   NSTEMI (non-ST elevated myocardial infarction) ([place]) 12/10/2012   Essential hypertension 12/10/2012   Hypothyroidism 12/10/2012    Obesity (BMI 35.0-39.9 without comorbidity) 12/10/2012   Gastroenteritis 10/08/2011   Acute ischemic colitis ([place]) 08/09/2011   DYSPHAGIA UNSPECIFIED 07/01/2010   RECTAL BLEEDING Jan 2013 11/04/2009   GERD 02/11/2009   Irritable bowel syndrome 02/11/2009   Generalized abdominal pain 02/11/2009   PERSONAL HX COLONIC POLYPS 02/11/2009    Past Medical History:  Diagnosis Date   Angina effort 05/22/2013   Anxiety    CAD (coronary artery disease), possible SCAD 12/14/2012   Colon polyps    Duodenitis    Dyspnea on exertion    LEXISCAN, 09/30/2008 - normal, EKG negative for ischemia, no ECG changes   Esophageal stricture    Gastritis    GERD (gastroesophageal reflux disease)    Gout    "only from RX given after MI" (01/22/2013)   H/O hiatal hernia    History of esophageal stricture    Hypertension    2D ECHO, 09/30/2008 - EF >55%, normal: RENAL DOPPLER, 03/21/2007 - normal duplex study   Hypothyroidism    IBS (irritable bowel syndrome)    Ischemic colitis ([place])    Lower GI bleeding    10/07/11 "I've had 3 episodes in the past year"   Myocardial infarction [facility]) 12/10/2012   "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)   Tubular adenoma polyp of rectum 2008    Past Surgical History:  Procedure Laterality Date   ABDOMINAL HYSTERECTOMY  1999   ANTERIOR LUMBAR FUSION  2010   "L4-5" (01/22/2013)  CARDIAC CATHETERIZATION N/A 02/06/2015   Procedure: Left Heart Cath and Coronary Angiography;  Surgeon: Leonie Man, MD;  Location: [facility];  Service: Cardiovascular;  Laterality: N/A;   ESOPHAGEAL DILATION  08/2010   ESOPHAGEAL DILATION     "once or twice" (01/22/2013)   HAMMER TOE SURGERY Bilateral ~ 2002   LEFT HEART CATHETERIZATION WITH CORONARY ANGIOGRAM N/A 12/11/2012   Procedure: LEFT HEART CATHETERIZATION WITH CORONARY ANGIOGRAM;  Surgeon: Leonie Man, MD;  Location: [facility] CATH LAB;  Service: Cardiovascular;  Laterality: N/A;   LEFT HEART CATHETERIZATION WITH  CORONARY ANGIOGRAM N/A 12/15/2012   Procedure: LEFT HEART CATHETERIZATION WITH CORONARY ANGIOGRAM;  Surgeon: Leonie Man, MD;  Location: [facility] CATH LAB;  Service: Cardiovascular;  Laterality: N/A;   PERIPHERAL VENOUS STUDY  10/03/2008   No evidence of DVT, superficial thrombosis, or Baker's cyst   TONSILLECTOMY AND ADENOIDECTOMY  1960's   TUBAL LIGATION  1985    Social History   Tobacco Use   Smoking status: Never   Smokeless tobacco: Never  Vaping Use   Vaping Use: Never used  Substance Use Topics   Alcohol use: No   Drug use: No    Family History  Problem Relation Age of Onset   Colon cancer Brother 37   Kidney disease Mother    Stroke Father    Multiple myeloma Sister    Heart attack Brother    Other Brother    Other Brother     No Known Allergies  Medication list has been reviewed and updated.  Current Outpatient Medications on File Prior to Visit  Medication Sig Dispense Refill   allopurinol (ZYLOPRIM) 100 MG tablet Take 3 tablets (300 mg total) by mouth daily. 270 tablet 3   ALPRAZolam (XANAX) 0.5 MG tablet TAKE 1 TABLET BY MOUTH 3 TIMES DAILY AS NEEDED FOR ANXIETY 90 tablet 1   amLODipine (NORVASC) 5 MG tablet Take 1 tablet (5 mg total) by mouth daily. 90 tablet 3   aspirin EC 81 MG tablet Take 81 mg by mouth daily. Swallow whole.     clobetasol (TEMOVATE) 0.05 % external solution Apply topically.     clopidogrel (PLAVIX) 75 MG tablet take 1 tablet by mouth once a day with breakfast 90 tablet 2   dicyclomine (BENTYL) 10 MG capsule Take 2 capsules (20 mg total) by mouth 4 (four) times daily -  before meals and at bedtime. (Patient taking differently: Take 20 mg by mouth as needed.) 240 capsule 0   estradiol (ESTRACE) 0.5 MG tablet Take 0.5 mg by mouth daily.     ezetimibe (ZETIA) 10 MG tablet Take 1 tablet (10 mg total) by mouth daily. 90 tablet 3   lisinopril (ZESTRIL) 5 MG tablet TAKE ONE TABLET BY MOUTH ONE TIME DAILY 90 tablet 3   metoprolol tartrate  (LOPRESSOR) 25 MG tablet TAKE ONE TABLET BY MOUTH TWICE DAILY 180 tablet 1   nitroGLYCERIN (NITROSTAT) 0.4 MG SL tablet DISSOLVE ONE TABLET UNDER TONGUE EVERY 5 MINUTES AS NEEDED FOR CHEST PAIN 25 tablet 0   pantoprazole (PROTONIX) 40 MG tablet TAKE ONE TABLET BY MOUTH ONE TIME DAILY 90 tablet 3   predniSONE (DELTASONE) 20 MG tablet Take one by mouth daily for 3-5 days as needed for gout flare 30 tablet 1   ranolazine (RANEXA) 1000 MG SR tablet TAKE ONE TABLET BY MOUTH TWICE DAILY 180 tablet 3   REPATHA SURECLICK 702 MG/ML SOAJ inject '140mg'$  into the skin once every 14 days 2  mL 11   doxycycline (VIBRAMYCIN) 100 MG capsule Take 1 capsule (100 mg total) by mouth 2 (two) times daily. (Patient not taking: Reported on 09/06/2022) 20 capsule 0   furosemide (LASIX) 20 MG tablet Take 1 tablet (20 mg total) by mouth daily. (Patient not taking: Reported on 09/06/2022) 90 tablet 3   pimecrolimus (ELIDEL) 1 % cream  (Patient not taking: Reported on 09/06/2022)     No current facility-administered medications on file prior to visit.    Review of Systems:  As per HPI- otherwise negative.   Physical Examination: Vitals:   09/06/22 1543  BP: 134/76  Pulse: 65  Resp: 18  SpO2: 97%   Vitals:   09/06/22 1543  Weight: 221 lb (100.2 kg)  Height: '5\' 3"'$  (1.6 m)   Body mass index is 39.15 kg/m. Ideal Body Weight: Weight in (lb) to have BMI = 25: 140.8  GEN: no acute distress.  Obese, looks well HEENT: Atraumatic, Normocephalic.  Bilateral TM wnl, oropharynx normal.  PEERL,EOMI. no cervical node abnormality is noted at this time Ears and Nose: No external deformity. CV: RRR, No M/G/R. No JVD. No thrill. No extra heart sounds. PULM: CTA B, no wheezes, crackles, rhonchi. No retractions. No resp. distress. No accessory muscle use. ABD: S, NT, ND, +BS. No rebound. No HSM. EXTR: No c/c/e PSYCH: Normally interactive. Conversant.   Results for orders placed or performed in visit on 09/06/22  POCT rapid strep  A  Result Value Ref Range   Rapid Strep A Screen Negative Negative     Assessment and Plan: Sore throat - Plan: POCT rapid strep A  Patient seen today with concern of sore throat.  Rapid strep is negative, exam is reassuring.  Suspect her symptoms may be due to reflux and/or allergies.  She is already on antireflux medication and plans to have an esophageal dilation soon.  We will have her add over-the-counter nasal steroid spray and a nonsedating antihistamine for allergies.  I have asked her to let me know if this is not getting better in the next 1 to 2 weeks  Signed Lamar Blinks, MD

## 2022-09-03 ENCOUNTER — Telehealth: Payer: Self-pay

## 2022-09-03 NOTE — Telephone Encounter (Signed)
Pharmacy Patient Advocate Encounter  Prior Authorization for REPATHA 140 MG/ML INJ has been approved.    Effective dates: 08/02/22 through 09/02/23   Received notification from [facility] that prior authorization for REPATHA 140 MG/ML INJ is needed.    PA submitted on 09/02/22 Key [id number] Status is pending  Karie Soda, [facility] Patient Advocate Specialist Direct Number: [phone] Fax: [phone]

## 2022-09-06 ENCOUNTER — Ambulatory Visit (INDEPENDENT_AMBULATORY_CARE_PROVIDER_SITE_OTHER): Payer: Medicare Other | Admitting: Family Medicine

## 2022-09-06 ENCOUNTER — Encounter: Payer: Self-pay | Admitting: Family Medicine

## 2022-09-06 VITALS — BP 134/76 | HR 65 | Resp 18 | Ht 63.0 in | Wt 221.0 lb

## 2022-09-06 DIAGNOSIS — J029 Acute pharyngitis, unspecified: Secondary | ICD-10-CM

## 2022-09-06 LAB — COMPREHENSIVE METABOLIC PANEL
Albumin: 4 (ref 3.5–5.0)
Calcium: 9.8 (ref 8.7–10.7)
Globulin: 3.1
eGFR: 50

## 2022-09-06 LAB — BASIC METABOLIC PANEL
BUN: 18 (ref 4–21)
CO2: 24 — AB (ref 13–22)
Chloride: 100 (ref 99–108)
Creatinine: 1.2 — AB (ref 0.5–1.1)
Glucose: 92
Potassium: 4.4 mEq/L (ref 3.5–5.1)
Sodium: 136 — AB (ref 137–147)

## 2022-09-06 LAB — CBC AND DIFFERENTIAL
HCT: 36 (ref 36–46)
Hemoglobin: 12.1 (ref 12.0–16.0)
Neutrophils Absolute: 4.6
Platelets: 286 10*3/uL (ref 150–400)
WBC: 6.9

## 2022-09-06 LAB — CBC: RBC: 3.9 (ref 3.87–5.11)

## 2022-09-06 LAB — POCT RAPID STREP A (OFFICE): Rapid Strep A Screen: NEGATIVE

## 2022-09-06 LAB — PROTEIN / CREATININE RATIO, URINE
Albumin, U: 3.5
Creatinine, Urine: 128

## 2022-09-06 NOTE — Patient Instructions (Signed)
I think your sore throat may be due to allergies  Try a nasal steroid spray such as flonase or nasacort- generic is ok!  Also I would add a non- sedating antihistamine such as claritin or zyrtec in the am- ok to use benadryl at night

## 2022-09-07 ENCOUNTER — Other Ambulatory Visit: Payer: Self-pay | Admitting: Nephrology

## 2022-09-07 DIAGNOSIS — N1831 Chronic kidney disease, stage 3a: Secondary | ICD-10-CM

## 2022-09-07 DIAGNOSIS — R7303 Prediabetes: Secondary | ICD-10-CM

## 2022-09-07 DIAGNOSIS — M109 Gout, unspecified: Secondary | ICD-10-CM

## 2022-09-07 DIAGNOSIS — I25119 Atherosclerotic heart disease of native coronary artery with unspecified angina pectoris: Secondary | ICD-10-CM

## 2022-09-07 DIAGNOSIS — R809 Proteinuria, unspecified: Secondary | ICD-10-CM

## 2022-09-07 DIAGNOSIS — I129 Hypertensive chronic kidney disease with stage 1 through stage 4 chronic kidney disease, or unspecified chronic kidney disease: Secondary | ICD-10-CM

## 2022-09-15 ENCOUNTER — Encounter: Payer: Self-pay | Admitting: Family Medicine

## 2022-09-28 ENCOUNTER — Ambulatory Visit
Admission: RE | Admit: 2022-09-28 | Discharge: 2022-09-28 | Disposition: A | Discharge status: Home / Self Care | Payer: Federal, State, Local not specified - PPO | Source: Ambulatory Visit | Attending: Nephrology | Admitting: Nephrology

## 2022-09-28 DIAGNOSIS — I25119 Atherosclerotic heart disease of native coronary artery with unspecified angina pectoris: Secondary | ICD-10-CM

## 2022-09-28 DIAGNOSIS — M109 Gout, unspecified: Secondary | ICD-10-CM

## 2022-09-28 DIAGNOSIS — I129 Hypertensive chronic kidney disease with stage 1 through stage 4 chronic kidney disease, or unspecified chronic kidney disease: Secondary | ICD-10-CM

## 2022-09-28 DIAGNOSIS — N1831 Chronic kidney disease, stage 3a: Secondary | ICD-10-CM

## 2022-09-28 DIAGNOSIS — R7303 Prediabetes: Secondary | ICD-10-CM

## 2022-09-28 DIAGNOSIS — R809 Proteinuria, unspecified: Secondary | ICD-10-CM

## 2022-10-04 ENCOUNTER — Telehealth: Payer: Self-pay

## 2022-10-04 ENCOUNTER — Ambulatory Visit: Payer: Federal, State, Local not specified - PPO | Attending: Physician Assistant | Admitting: Physician Assistant

## 2022-10-04 ENCOUNTER — Encounter: Payer: Self-pay | Admitting: Physician Assistant

## 2022-10-04 VITALS — BP 122/70 | HR 58 | Ht 63.0 in | Wt 221.8 lb

## 2022-10-04 DIAGNOSIS — I10 Essential (primary) hypertension: Secondary | ICD-10-CM | POA: Diagnosis not present

## 2022-10-04 DIAGNOSIS — I2510 Atherosclerotic heart disease of native coronary artery without angina pectoris: Secondary | ICD-10-CM | POA: Diagnosis not present

## 2022-10-04 DIAGNOSIS — Z01818 Encounter for other preprocedural examination: Secondary | ICD-10-CM | POA: Diagnosis not present

## 2022-10-04 DIAGNOSIS — E785 Hyperlipidemia, unspecified: Secondary | ICD-10-CM | POA: Diagnosis not present

## 2022-10-04 MED ORDER — METOPROLOL TARTRATE 25 MG PO TABS: 25.0000 mg | ORAL_TABLET | Freq: Two times a day (BID) | ORAL | 3 refills | Status: DC

## 2022-10-04 NOTE — Patient Instructions (Signed)
Medication Instructions:   Your physician recommends that you continue on your current medications as directed. Please refer to the Current Medication list given to you today.  *If you need a refill on your cardiac medications before your next appointment, please call your pharmacy*  Lab Work: NONE ordered at this time of appointment   If you have labs (blood work) drawn today and your tests are completely normal, you will receive your results only by: [facility] (if you have MyChart) OR A paper copy in the mail If you have any lab test that is abnormal or we need to change your treatment, we will call you to review the results.  Testing/Procedures: NONE ordered at this time of appointment   Follow-Up: At [facility], you and your health needs are our priority.  As part of our continuing mission to provide you with exceptional heart care, we have created designated Provider Care Teams.  These Care Teams include your primary Cardiologist (physician) and Advanced Practice Providers (APPs -  Physician Assistants and Nurse Practitioners) who all work together to provide you with the care you need, when you need it.   Your next appointment:   3 month(s) 9 months   Provider:   Dr. [facility]     Dr. Gwenlyn Found  Other Instructions

## 2022-10-04 NOTE — Progress Notes (Signed)
Cardiology Office Note:    Date:  10/06/2022   ID:  [name], DOB 1955/08/09, MRN [id number]  PCP:  Sabrina Cables, MD   [facility] Providers Cardiologist:  Sabrina Batty, MD     Referring MD: Sabrina Cables, MD   Chief Complaint  Patient presents with   Follow-up    Seen for Dr. Allyson Sabal    History of Present Illness:    [name] is a 67 y.o. female with a hx of HTN, HLD with statin intolerance, obesity, hypothyroidism and CAD. Her initial cardiac catheterization in 12/10/2012 performed by Dr. Herbie Day demonstrated diffuse LAD disease with essentially subtotal occlusion that had the appearance of possible spontaneous coronary artery dissection, however with existing CAD, single vessel diffuse disease was also possible. It was decided not to pursue any intervention at the time but to wait and have patient returned several days later for relook cath to reevaluate the LAD. She returned to the cath lab on 12/15/2012 for relook study, she was noted to have progression of LAD disease to total occlusion at the original 95% subtotal location with improved D1 to diagonal LAD and right to left collaterals. The image was reviewed with the interventional team and concluded the best course of option was not proceed with extensive LAD PTCA-PCI in the absence of ongoing symptom. Medical therapy was recommended. Last cardiac catheterization performed on 02/06/2015 showed total occlusion of entire mid to distal LAD, with faint collaterals from left to left and right-to-left, 90% ostial first septal lesion, 35% mid RCA, 45% distal RCA, 60% OM lesion. Medical therapy was recommended.  Echocardiogram obtained on 10/10/2019 showed EF 50 to 55%, moderate asymmetric LVH, severe hypokinesis in the mid anteroseptal wall consistent with LAD territory, normal RVEF, mild to moderate MR. Echocardiogram performed in March 2022 showed normal EF, moderate LVH, no significant valve issue. Due  to the dizziness, heart monitor was performed, this revealed sinus rhythm, occasional PACs and PVCs, short run of SVT.  Nothing to explain her episodic falls.  She was admitted in September 2022 with symptomatic acute GI bleed with questionable infectious colitis that was treated with Augmentin.  She has not had any GI bleeding issue since.  She is being followed by Dr. Rennis Day for management of hyperlipidemia.  She is on combination of Zetia and Repatha which is controlling her LDL very well.  Patient presents today for follow-up.  She is doing very well from the cardiac perspective.  She denies any lower extremity edema, orthopnea, PND or any exertional chest pain.  She has an upcoming trip to [place].  I congratulated her on very well-controlled cholesterol based on her October lab work.  She does have upcoming dental extraction for tooth #18.  She may proceed from the cardiac perspective, she does not need SBE prophylaxis.  Normally we do not hold aspirin and Plavix for single teeth extraction, however if it is a surgical extraction and that the bleeding risk is deemed too high, she may hold Plavix for 5 days prior to the procedure and restart as soon as possible afterward.  Overall, she is doing quite well from the cardiac perspective and can follow-up with Dr. Allyson Sabal in 9 months.   Past Medical History:  Diagnosis Date   Angina effort 05/22/2013   Anxiety    CAD (coronary artery disease), possible SCAD 12/14/2012   Colon polyps    Duodenitis    Dyspnea on exertion    LEXISCAN, 09/30/2008 - normal,  EKG negative for ischemia, no ECG changes   Esophageal stricture    Gastritis    GERD (gastroesophageal reflux disease)    Gout    "only from RX given after MI" (01/22/2013)   H/O hiatal hernia    History of esophageal stricture    Hypertension    2D ECHO, 09/30/2008 - EF >55%, normal: RENAL DOPPLER, 03/21/2007 - normal duplex study   Hypothyroidism    IBS (irritable bowel syndrome)    Ischemic colitis  (HCC)    Lower GI bleeding    10/07/11 "I've had 3 episodes in the past year"   Myocardial infarction [name]) 12/10/2012   "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)   Tubular adenoma polyp of rectum 2008    Past Surgical History:  Procedure Laterality Date   ABDOMINAL HYSTERECTOMY  1999   ANTERIOR LUMBAR FUSION  2010   "L4-5" (01/22/2013)   CARDIAC CATHETERIZATION N/A 02/06/2015   Procedure: Left Heart Cath and Coronary Angiography;  Surgeon: Marykay Lex, MD;  Location: [facility] INVASIVE CV LAB;  Service: Cardiovascular;  Laterality: N/A;   ESOPHAGEAL DILATION  08/2010   ESOPHAGEAL DILATION     "once or twice" (01/22/2013)   HAMMER TOE SURGERY Bilateral ~ 2002   LEFT HEART CATHETERIZATION WITH CORONARY ANGIOGRAM N/A 12/11/2012   Procedure: LEFT HEART CATHETERIZATION WITH CORONARY ANGIOGRAM;  Surgeon: Marykay Lex, MD;  Location[facility] CATH LAB;  Service: Cardiovascular;  Laterality: N/A;   LEFT HEART CATHETERIZATION WITH CORONARY ANGIOGRAM N/A 12/15/2012   Procedure: LEFT HEART CATHETERIZATION WITH CORONARY ANGIOGRAM;  Surgeon: Marykay Lex, MD;  Location: [facility] CATH LAB;  Service: Cardiovascular;  Laterality: N/A;   PERIPHERAL VENOUS STUDY  10/03/2008   No evidence of DVT, superficial thrombosis, or Baker's cyst   TONSILLECTOMY AND ADENOIDECTOMY  1960's   TUBAL LIGATION  1985    Current Medications: Current Meds  Medication Sig   allopurinol (ZYLOPRIM) 100 MG tablet Take 3 tablets (300 mg total) by mouth daily.   ALPRAZolam (XANAX) 0.5 MG tablet TAKE 1 TABLET BY MOUTH 3 TIMES DAILY AS NEEDED FOR ANXIETY   amLODipine (NORVASC) 5 MG tablet Take 1 tablet (5 mg total) by mouth daily.   aspirin EC 81 MG tablet Take 81 mg by mouth daily. Swallow whole.   clobetasol (TEMOVATE) 0.05 % external solution Apply topically.   clopidogrel (PLAVIX) 75 MG tablet take 1 tablet by mouth once a day with breakfast   dicyclomine (BENTYL) 10 MG capsule Take 2 capsules (20 mg total) by mouth 4  (four) times daily -  before meals and at bedtime. (Patient taking differently: Take 20 mg by mouth as needed.)   estradiol (ESTRACE) 0.5 MG tablet Take 0.5 mg by mouth daily.   ezetimibe (ZETIA) 10 MG tablet Take 1 tablet (10 mg total) by mouth daily.   lisinopril (ZESTRIL) 5 MG tablet TAKE ONE TABLET BY MOUTH ONE TIME DAILY   nitroGLYCERIN (NITROSTAT) 0.4 MG SL tablet DISSOLVE ONE TABLET UNDER TONGUE EVERY 5 MINUTES AS NEEDED FOR CHEST PAIN   pantoprazole (PROTONIX) 40 MG tablet TAKE ONE TABLET BY MOUTH ONE TIME DAILY   pimecrolimus (ELIDEL) 1 % cream    ranolazine (RANEXA) 1000 MG SR tablet TAKE ONE TABLET BY MOUTH TWICE DAILY   REPATHA SURECLICK 140 MG/ML SOAJ inject 140mg  into the skin once every 14 days   [DISCONTINUED] metoprolol tartrate (LOPRESSOR) 25 MG tablet TAKE ONE TABLET BY MOUTH TWICE DAILY     Allergies:   Patient has no known  allergies.   Social History   Socioeconomic History   Marital status: Single    Spouse name: Not on file   Number of children: 2   Years of education: Not on file   Highest education level: Not on file  Occupational History   Occupation: Drug enforcement    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  Tobacco Use   Smoking status: Never   Smokeless tobacco: Never  Vaping Use   Vaping Use: Never used  Substance and Sexual Activity   Alcohol use: No   Drug use: No   Sexual activity: Not Currently  Other Topics Concern   Not on file  Social History Narrative   Daily caffeine use.   Social Determinants of Health   Financial Resource Strain: Low Risk  (07/09/2022)   Overall Financial Resource Strain (CARDIA)    Difficulty of Paying Living Expenses: Not hard at all  Food Insecurity: No Food Insecurity (07/09/2022)   Hunger Vital Sign    Worried About Running Out of Food in the Last Year: Never true    Ran Out of Food in the Last Year: Never true  Transportation Needs: No Transportation Needs (07/09/2022)   PRAPARE - Scientist, research (physical sciences) (Medical): No    Lack of Transportation (Non-Medical): No  Physical Activity: Inactive (07/09/2022)   Exercise Vital Sign    Days of Exercise per Week: 0 days    Minutes of Exercise per Session: 0 min  Stress: Stress Concern Present (07/09/2022)   [employer] of Occupational Health - Occupational Stress Questionnaire    Feeling of Stress : To some extent  Social Connections: Moderately Integrated (07/09/2022)   Social Connection and Isolation Panel [NHANES]    Frequency of Communication with Friends and Family: More than three times a week    Frequency of Social Gatherings with Friends and Family: Three times a week    Attends Religious Services: More than 4 times per year    Active Member of Clubs or Organizations: Yes    Attends Engineer, structural: More than 4 times per year    Marital Status: Divorced     Family History: The patient's family history includes Colon cancer (age of onset: 70) in her brother; Heart attack in her brother; Kidney disease in her mother; Multiple myeloma in her sister; Other in her brother and brother; Stroke in her father.  ROS:   Please see the history of present illness.     All other systems reviewed and are negative.  EKGs/Labs/Other Studies Reviewed:    The following studies were reviewed today:  Echo 10/13/2020  1. Left ventricular ejection fraction, by estimation, is 55 to 60%. The  left ventricle has normal function. The left ventricle demonstrates  regional wall motion abnormalities (see scoring diagram/findings for  description). There is hypokinesis of the  mid anteroseptal wall most notable on the short axis view. There is  moderate concentric left ventricular hypertrophy. Left ventricular  diastolic parameters are consistent with Grade I diastolic dysfunction  (impaired relaxation).   2. Right ventricular systolic function is normal. The right ventricular  size is normal. There is normal pulmonary artery  systolic pressure. The  estimated right ventricular systolic pressure is 25.3 mmHg.   3. Left atrial size was mildly dilated.   4. The mitral valve is grossly normal. Mild mitral valve regurgitation.   5. The aortic valve is tricuspid. There is mild calcification of the  aortic valve. There is mild thickening of  the aortic valve. Aortic valve  regurgitation is not visualized. Mild to moderate aortic valve  sclerosis/calcification is present, without any  evidence of aortic stenosis.   6. Aortic dilatation noted. There is mild dilatation of the ascending  aorta, measuring 36 mm.   7. The inferior vena cava is normal in size with greater than 50%  respiratory variability, suggesting right atrial pressure of 3 mmHg.   Comparison(s): Compared to prior study in 10/2019, the LVEF appears normal  55-60% and the MR appears mild.    EKG:  EKG is ordered today.  The ekg ordered today demonstrates normal sinus rhythm, no significant ST-T wave changes.  T wave inversion in V1 and V2.  Recent Labs: 02/24/2022: TSH 3.20 09/06/2022: BUN 18; Creatinine 1.2; Hemoglobin 12.1; Platelets 286; Potassium 4.4; Sodium 136  Recent Lipid Panel    Component Value Date/Time   CHOL 126 05/05/2022 1041   TRIG 146 05/05/2022 1041   HDL 65 05/05/2022 1041   CHOLHDL 1.9 05/05/2022 1041   CHOLHDL 3 03/22/2019 0839   VLDL 35.8 03/22/2019 0839   LDLCALC 37 05/05/2022 1041   LDLDIRECT 128.0 01/12/2017 1455     Risk Assessment/Calculations:           Physical Exam:    VS:  BP 122/70   Pulse (!) 58   Ht 5\' 3"  (1.6 m)   Wt 221 lb 12.8 oz (100.6 kg)   SpO2 96%   BMI 39.29 kg/m        Wt Readings from Last 3 Encounters:  10/04/22 221 lb 12.8 oz (100.6 kg)  09/06/22 221 lb (100.2 kg)  06/03/22 227 lb (103 kg)     GEN:  Well nourished, well developed in no acute distress HEENT: Normal NECK: No JVD; No carotid bruits LYMPHATICS: No lymphadenopathy CARDIAC: RRR, no murmurs, rubs, gallops RESPIRATORY:   Clear to auscultation without rales, wheezing or rhonchi  ABDOMEN: Soft, non-tender, non-distended MUSCULOSKELETAL:  No edema; No deformity  SKIN: Warm and dry NEUROLOGIC:  Alert and oriented x 3 PSYCHIATRIC:  Normal affect   ASSESSMENT:    1. Coronary artery disease involving native coronary artery of native heart without angina pectoris   2. Preop examination   3. Essential hypertension   4. Hyperlipidemia LDL goal <70    PLAN:    In order of problems listed above:  CAD: Denies any recent chest pain.  Continue aspirin, Plavix and Repatha  Preoperative clearance: Upcoming dental extraction.  Patient is at acceptable risk to proceed.  Usually, we do not recommend holding aspirin and Plavix for single tooth extraction, however if surgical extraction is needed and bleeding risk is deemed to very high, may hold Plavix for 5 days prior to the procedure.  Hypertension: Blood pressure stable  Hyperlipidemia: On Repatha           Medication Adjustments/Labs and Tests Ordered: Current medicines are reviewed at length with the patient today.  Concerns regarding medicines are outlined above.  Orders Placed This Encounter  Procedures   EKG 12-Lead   Meds ordered this encounter  Medications   metoprolol tartrate (LOPRESSOR) 25 MG tablet    Sig: Take 1 tablet (25 mg total) by mouth 2 (two) times daily.    Dispense:  180 tablet    Refill:  3    Patient Instructions  Medication Instructions:   Your physician recommends that you continue on your current medications as directed. Please refer to the Current Medication list given to you today.  *  If you need a refill on your cardiac medications before your next appointment, please call your pharmacy*  Lab Work: NONE ordered at this time of appointment   If you have labs (blood work) drawn today and your tests are completely normal, you will receive your results only by: MyChart Message (if you have MyChart) OR A paper copy in  the mail If you have any lab test that is abnormal or we need to change your treatment, we will call you to review the results.  Testing/Procedures: NONE ordered at this time of appointment   Follow-Up: At [facility], you and your health needs are our priority.  As part of our continuing mission to provide you with exceptional heart care, we have created designated Provider Care Teams.  These Care Teams include your primary Cardiologist (physician) and Advanced Practice Providers (APPs -  Physician Assistants and Nurse Practitioners) who all work together to provide you with the care you need, when you need it.   Your next appointment:   3 month(s) 9 months   Provider:   [facility]     Dr. Allyson Sabal  Other Instructions     Signed, Azalee Course, PA  10/06/2022 10:08 PM    [facility]

## 2022-10-04 NOTE — Telephone Encounter (Signed)
Cardiac clearance addressed during office visit. See under letters section of Chart Review for preop clearance letter. Preop clearance letter handed to the patient before she left office

## 2022-10-04 NOTE — Telephone Encounter (Signed)
   Pre-operative Risk Assessment    Patient Name: [name]  DOB: 25-Jan-1956 MRN: [id number]     Request for Surgical Clearance    Procedure:  Dental Extraction - Amount of Teeth to be Pulled:  1 extraction   Date of Surgery:  Clearance TBD                                 Surgeon:  Dr. Massie Maroon Surgeon's Group or Practice Name:  [facility] Phone number:  [phone] Fax number:  [phone]   Type of Clearance Requested:   - Medical  - Pharmacy:  Hold Aspirin     Type of Anesthesia:  Not Indicated   Additional requests/questions:    SignedJacqulynn Cadet   10/04/2022, 9:49 AM

## 2022-10-06 ENCOUNTER — Encounter: Payer: Self-pay | Admitting: Physician Assistant

## 2022-10-07 ENCOUNTER — Other Ambulatory Visit: Payer: Self-pay | Admitting: Cardiovascular Disease

## 2022-11-20 LAB — LIPID PANEL
Chol/HDL Ratio: 2 ratio (ref 0.0–4.4)
Cholesterol, Total: 135 mg/dL (ref 100–199)
HDL: 66 mg/dL (ref >39)
LDL Chol Calc (NIH): 44 mg/dL (ref 0–99)
Triglycerides: 152 mg/dL — ABNORMAL HIGH (ref 0–149)
VLDL Cholesterol Cal: 25 mg/dL (ref 5–40)

## 2022-11-25 ENCOUNTER — Ambulatory Visit: Payer: Federal, State, Local not specified - PPO | Attending: Internal Medicine | Admitting: Internal Medicine

## 2022-11-25 ENCOUNTER — Encounter: Payer: Self-pay | Admitting: Internal Medicine

## 2022-11-25 VITALS — BP 120/76 | HR 63 | Ht 62.0 in | Wt 221.0 lb

## 2022-11-25 DIAGNOSIS — E785 Hyperlipidemia, unspecified: Secondary | ICD-10-CM | POA: Diagnosis not present

## 2022-11-25 DIAGNOSIS — M791 Myalgia, unspecified site: Secondary | ICD-10-CM | POA: Diagnosis not present

## 2022-11-25 DIAGNOSIS — I2510 Atherosclerotic heart disease of native coronary artery without angina pectoris: Secondary | ICD-10-CM

## 2022-11-25 DIAGNOSIS — T466X5A Adverse effect of antihyperlipidemic and antiarteriosclerotic drugs, initial encounter: Secondary | ICD-10-CM

## 2022-11-25 DIAGNOSIS — T466X5D Adverse effect of antihyperlipidemic and antiarteriosclerotic drugs, subsequent encounter: Secondary | ICD-10-CM | POA: Diagnosis not present

## 2022-11-25 NOTE — Patient Instructions (Addendum)
Medication Instructions:  Your physician recommends that you continue on your current medications as directed. Please refer to the Current Medication list given to you today.  *If you need a refill on your cardiac medications before your next appointment, please call your pharmacy*  Follow-Up: At [facility], you and your health needs are our priority.  As part of our continuing mission to provide you with exceptional heart care, we have created designated Provider Care Teams.  These Care Teams include your primary Cardiologist (physician) and Advanced Practice Providers (APPs -  Physician Assistants and Nurse Practitioners) who all work together to provide you with the care you need, when you need it.  We recommend signing up for the patient portal called "MyChart".  Sign up information is provided on this After Visit Summary.  MyChart is used to connect with patients for Virtual Visits (Telemedicine).  Patients are able to view lab/test results, encounter notes, upcoming appointments, etc.  Non-urgent messages can be sent to your provider as well.   To learn more about what you can do with MyChart, go to [email or web address].    Your next appointment:   12 month(s)-[facility]  Provider:   Dr. Rennis Golden

## 2022-11-25 NOTE — Progress Notes (Signed)
LIPID CLINIC CONSULT NOTE  Chief Complaint:  Follow-up dyslipidemia  Primary Care Physician: Pearline Cables, MD  Primary Cardiologist:  Nanetta Batty, MD  HPI:  [name] is a 67 y.o. female who is being seen today for the evaluation of dyslipidemia at the request of Copland, Gwenlyn Found, MD.  This is a pleasant 67 year old female patient of Dr. Gery Pray with a history of coronary artery disease and occluded LAD as well as mitral valvular disease.  She has had persistent dyslipidemia and unfortunately statin intolerance.  Most recently her lipids show total cholesterol 236, triglycerides 150, HDL 72 and LDL 138.  Her target LDL is less than 70.  She was approved for Repatha and took it for a brief period of time however was getting it via mail order which was cumbersome and the cost was about $70 a month which was not affordable for her.  Ultimately she discontinued it.  She is here today to discuss additional options.  I do think that a PCSK9 inhibitor is the best option for her.  Other options could be ezetimibe, Nexletol or other therapies however cardiovascular risk reduction is not a significant or unknown.  02/12/2020  [name] returns today for follow-up.  She seems to be tolerating Repatha with a nice improvement in her lipids.  Her total cholesterol is decreased from 236-149, triglycerides have gone up slightly to 170 with a decrease in LDL cholesterol from 138-60.  10/10/2020  [name] returns today for follow-up of dyslipidemia.  She had labs drawn last month and has much improved cholesterol although not quite to goal.  Her total cholesterol is 175, HDL 64 LDL 80 and triglycerides 187.  Her target LDL is less than 70.  01/29/2022  [name] returns today for follow-up.  We have been planning a 25-month aggressive diet and exercise routine to try to lower cholesterol further.  Unfortunately her cholesterol is worse.  Total cholesterol now 194,  triglycerides 196, HDL 63 and LDL 98 (up from 95).  I discussed that she remains well above her target LDL less than 70.  She also is complaining of some left great toe pain.  She apparently had had gout in her right toe in the past and was concerned about this  05/11/2022  [name] returns today for follow-up.  I have added ezetimibe to her list of meds.  She is frustrated today because of how many medicines she is on however her cholesterol is now much better controlled.  She had been struggling with gout and now is on allopurinol with some improvement.  She had to take steroids.  Her total cholesterol now 126, triglycerides 146, HDL 65 and LDL 37 which is outstanding.  She remains on combination therapy with ezetimibe and Repatha.  11/25/2022  [name] is seen today in follow-up.  She is continuing to do well on Repatha.  Her recent lipid showed total cholesterol 130, triglycerides 152, HDL 66 and LDL 44.  Blood pressure is well-controlled at 120/76.  PMHx:  Past Medical History:  Diagnosis Date   Angina effort 05/22/2013   Anxiety    CAD (coronary artery disease), possible SCAD 12/14/2012   Colon polyps    Duodenitis    Dyspnea on exertion    LEXISCAN, 09/30/2008 - normal, EKG negative for ischemia, no ECG changes   Esophageal stricture    Gastritis    GERD (gastroesophageal reflux disease)    Gout    "only from RX given after  MI" (01/22/2013)   H/O hiatal hernia    History of esophageal stricture    Hypertension    2D ECHO, 09/30/2008 - EF >55%, normal: RENAL DOPPLER, 03/21/2007 - normal duplex study   Hypothyroidism    IBS (irritable bowel syndrome)    Ischemic colitis    Lower GI bleeding    10/07/11 "I've had 3 episodes in the past year"   Myocardial infarction 12/10/2012   "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)   Tubular adenoma polyp of rectum 2008    Past Surgical History:  Procedure Laterality Date   ABDOMINAL HYSTERECTOMY  1999   ANTERIOR LUMBAR  FUSION  2010   "L4-5" (01/22/2013)   CARDIAC CATHETERIZATION N/A 02/06/2015   Procedure: Left Heart Cath and Coronary Angiography;  Surgeon: Marykay Lex, MD;  Location: [facility] INVASIVE CV LAB;  Service: Cardiovascular;  Laterality: N/A;   ESOPHAGEAL DILATION  08/2010   ESOPHAGEAL DILATION     "once or twice" (01/22/2013)   HAMMER TOE SURGERY Bilateral ~ 2002   LEFT HEART CATHETERIZATION WITH CORONARY ANGIOGRAM N/A 12/11/2012   Procedure: LEFT HEART CATHETERIZATION WITH CORONARY ANGIOGRAM;  Surgeon: Marykay Lex, MD;  Location: [facility] CATH LAB;  Service: Cardiovascular;  Laterality: N/A;   LEFT HEART CATHETERIZATION WITH CORONARY ANGIOGRAM N/A 12/15/2012   Procedure: LEFT HEART CATHETERIZATION WITH CORONARY ANGIOGRAM;  Surgeon: Marykay Lex, MD;  Location: [facility] CATH LAB;  Service: Cardiovascular;  Laterality: N/A;   PERIPHERAL VENOUS STUDY  10/03/2008   No evidence of DVT, superficial thrombosis, or Baker's cyst   TONSILLECTOMY AND ADENOIDECTOMY  1960's   TUBAL LIGATION  1985    FAMHx:  Family History  Problem Relation Age of Onset   Colon cancer Brother 25   Kidney disease Mother    Stroke Father    Multiple myeloma Sister    Heart attack Brother    Other Brother    Other Brother     SOCHx:   reports that she has never smoked. She has never used smokeless tobacco. She reports that she does not drink alcohol and does not use drugs.  ALLERGIES:  No Known Allergies  ROS: Pertinent items noted in HPI and remainder of comprehensive ROS otherwise negative.  HOME MEDS: Current Outpatient Medications on File Prior to Visit  Medication Sig Dispense Refill   allopurinol (ZYLOPRIM) 100 MG tablet Take 3 tablets (300 mg total) by mouth daily. 270 tablet 3   ALPRAZolam (XANAX) 0.5 MG tablet TAKE 1 TABLET BY MOUTH 3 TIMES DAILY AS NEEDED FOR ANXIETY 90 tablet 1   amLODipine (NORVASC) 5 MG tablet Take 1 tablet (5 mg total) by mouth daily. 90 tablet 3   aspirin EC 81 MG tablet Take 81 mg by  mouth daily. Swallow whole.     clobetasol (TEMOVATE) 0.05 % external solution Apply topically.     clopidogrel (PLAVIX) 75 MG tablet take 1 tablet by mouth once a day with breakfast 90 tablet 2   estradiol (ESTRACE) 0.5 MG tablet Take 0.5 mg by mouth daily.     ezetimibe (ZETIA) 10 MG tablet Take 1 tablet (10 mg total) by mouth daily. 90 tablet 3   furosemide (LASIX) 20 MG tablet Take 1 tablet (20 mg total) by mouth daily. 90 tablet 3   lisinopril (ZESTRIL) 5 MG tablet TAKE ONE TABLET BY MOUTH ONE TIME DAILY 90 tablet 3   metoprolol tartrate (LOPRESSOR) 25 MG tablet Take 1 tablet (25 mg total) by mouth 2 (two) times daily. 180 tablet  3   nitroGLYCERIN (NITROSTAT) 0.4 MG SL tablet DISSOLVE 1 TABLET UNDER THE TONGUE EVERY 5 MINUTES AS NEEDED FOR CHEST PAIN 25 tablet 3   pantoprazole (PROTONIX) 40 MG tablet TAKE ONE TABLET BY MOUTH ONE TIME DAILY 90 tablet 3   pimecrolimus (ELIDEL) 1 % cream      predniSONE (DELTASONE) 20 MG tablet Take one by mouth daily for 3-5 days as needed for gout flare 30 tablet 1   ranolazine (RANEXA) 1000 MG SR tablet TAKE ONE TABLET BY MOUTH TWICE DAILY 180 tablet 3   REPATHA SURECLICK 140 MG/ML SOAJ inject  into the skin once every 14 days 2 mL 11   dicyclomine (BENTYL) 10 MG capsule Take 2 capsules (20 mg total) by mouth 4 (four) times daily -  before meals and at bedtime. (Patient not taking: Reported on 11/25/2022) 240 capsule 0   doxycycline (VIBRAMYCIN) 100 MG capsule Take 1 capsule (100 mg total) by mouth 2 (two) times daily. (Patient not taking: Reported on 11/25/2022) 20 capsule 0   No current facility-administered medications on file prior to visit.    LABS/IMAGING: No results found for this or any previous visit (from the past 48 hour(s)). No results found.  LIPID PANEL:    Component Value Date/Time   CHOL 135 11/19/2022 1045   TRIG 152 (H) 11/19/2022 1045   HDL 66 11/19/2022 1045   CHOLHDL 2.0 11/19/2022 1045   CHOLHDL 3 03/22/2019 0839   VLDL  35.8 03/22/2019 0839   LDLCALC 44 11/19/2022 1045   LDLDIRECT 128.0 01/12/2017 1455    WEIGHTS: Wt Readings from Last 3 Encounters:  11/25/22 221 lb (100.2 kg)  10/04/22 221 lb 12.8 oz (100.6 kg)  09/06/22 221 lb (100.2 kg)    VITALS: BP 120/76 (BP Location: Right Arm, Patient Position: Sitting, Cuff Size: Large)   Pulse 63   Ht  (1.575 m)   Wt 221 lb (100.2 kg)   SpO2 97%   BMI 40.42 kg/m   EXAM: Deferred  EKG: Deferred  ASSESSMENT: Mixed dyslipidemia, goal LDL less than 70 Statin intolerance-myalgias Coronary artery disease with occluded LAD Mitral valve disease Gout  PLAN: 1.   Ms. Eisenmenger continues to have well treated lipids at target on Repatha.  She has no issues with the medication.  Will continue with her current therapies.  Plan repeat lipids and follow-up with me annually or sooner as necessary.  Chrystie Nose, MD, [facility], FACP  [place]  [facility]  Medical Director of the Advanced Lipid Disorders &  Cardiovascular Risk Reduction Clinic Diplomate of the American Board of Clinical Lipidology Attending Cardiologist  Direct Dial: [phone]  Fax: [phone]  Website:  [email or web address] 11/25/2022, 12:03 PM

## 2022-12-21 ENCOUNTER — Other Ambulatory Visit: Payer: Self-pay | Admitting: Internal Medicine

## 2022-12-21 ENCOUNTER — Other Ambulatory Visit: Payer: Self-pay | Admitting: Family Medicine

## 2022-12-21 DIAGNOSIS — F411 Generalized anxiety disorder: Secondary | ICD-10-CM

## 2023-01-10 ENCOUNTER — Other Ambulatory Visit: Payer: Self-pay | Admitting: Cardiovascular Disease

## 2023-01-14 ENCOUNTER — Other Ambulatory Visit: Payer: Self-pay | Admitting: Internal Medicine

## 2023-01-16 ENCOUNTER — Encounter: Payer: Self-pay | Admitting: Cardiovascular Disease

## 2023-01-17 ENCOUNTER — Ambulatory Visit (INDEPENDENT_AMBULATORY_CARE_PROVIDER_SITE_OTHER): Payer: Medicare Other | Admitting: Family

## 2023-01-17 ENCOUNTER — Encounter (HOSPITAL_BASED_OUTPATIENT_CLINIC_OR_DEPARTMENT_OTHER): Payer: Self-pay | Admitting: Family

## 2023-01-17 VITALS — BP 119/74 | HR 58 | Ht 62.0 in | Wt 218.9 lb

## 2023-01-17 DIAGNOSIS — I25118 Atherosclerotic heart disease of native coronary artery with other forms of angina pectoris: Secondary | ICD-10-CM

## 2023-01-17 DIAGNOSIS — I10 Essential (primary) hypertension: Secondary | ICD-10-CM

## 2023-01-17 DIAGNOSIS — E785 Hyperlipidemia, unspecified: Secondary | ICD-10-CM

## 2023-01-17 NOTE — Patient Instructions (Signed)
Medication Instructions:  Your physician recommends that you continue on your current medications as directed. Please refer to the Current Medication list given to you today.   *If you need a refill on your cardiac medications before your next appointment, please call your pharmacy*  Lab Work: NONE   Testing/Procedures: Your physician has requested that you have a lexiscan myoview. For further information please visit [email or web address] Please follow instruction sheet, as given.  Follow-Up: At [facility], you and your health needs are our priority.  As part of our continuing mission to provide you with exceptional heart care, we have created designated Provider Care Teams.  These Care Teams include your primary Cardiologist (physician) and Advanced Practice Providers (APPs -  Physician Assistants and Nurse Practitioners) who all work together to provide you with the care you need, when you need it.  We recommend signing up for the patient portal called "MyChart".  Sign up information is provided on this After Visit Summary.  MyChart is used to connect with patients for Virtual Visits (Telemedicine).  Patients are able to view lab/test results, encounter notes, upcoming appointments, etc.  Non-urgent messages can be sent to your provider as well.   To learn more about what you can do with MyChart, go to [email or web address].    Your next appointment:   6 week(s)  Provider:   Gillian Shields, NP

## 2023-01-17 NOTE — Progress Notes (Signed)
Cardiology Office Note:  .   Date:  01/19/2023  ID:  [name], DOB Oct 04, 1955, MRN [id number] PCP: Pearline Cables, MD  [facility] Providers Cardiologist:  Nanetta Batty, MD    History of Present Illness: [name] is a 66 y.o. female with a history of hypertension, hyperlipidemia statin intolerance, obesity, hypothyroidism, CAD.    Initial catheterization 2014 with diffuse LAD disease with essentially subtotal occlusion appearance of possible spontaneous coronary artery dissection however with existing CAD single diffuse vessel disease also possible.  Repeat cath 11/2012 for relook with progression of LAD disease to total occlusion at original 95% subtotal location with improved D1 to diagonal LAD and right to left collaterals.  Plan for medical therapy.  Last cardiac catheterization 01/2015 with total occlusion of entire mid to distal LAD with faint collaterals from left to left and right to left, 90% ostial first septal lesion, 35% mid RCA, 45% distal RCA, 60% OM lesion.  Recommended for medical therapy.  Most recent echo 10/13/2020 normal LVEF, grade 1 diastolic dysfunction, mild MR, stable wall motion abnormalities.  Monitor 4/2022With predominant sinus rhythm, occasional PAC/PVC with less than 1% burden, short runs of SVT.   Presents today as add-on same-day appointment.  She contacted the office earlier today noting that on Thursday (4 days ago) she had episode of sweating profusely, nauseous, tired while standing with a friend. Does not monitor BP at home. Reports some left sided chest pressure at that time. The minor left sided chest discomfort has persisted. Also notes her left arm is still weak. She reports that she has to "go deep" to get a deep breath. Reports occasional heart fluttering it did occur during the episode. She has baseline constipation. No cough, wheeze. Usually walks 3-4 miles every morning. Has not done so since the episode.   ROS: Please  see the history of present illness.    All other systems reviewed and are negative.   Studies Reviewed: Marland Kitchen        EKG: EKG ordered and performed today reveals SB 58 bpm with no acute ST/T wave changes.   Cardiac Studies & Procedures   CARDIAC CATHETERIZATION  CARDIAC CATHETERIZATION 02/06/2015  Narrative Images from the original result were not included. 1. Stable multivessel disease with diffuse eccentric stenosis in the circumflex-OM 2 , diffuse mild-to-moderate disease in the RCA , as well as subtotal/total occlusion of the entire mid to distal LAD -- with faint collaterals from left the left and right to left, along with 2. The left ventricular systolic function is essentially normal with normal LVEDP 3. Mid LAD to Dist LAD lesion, 100% stenosed - similar in appearance to 11/2012 (but now essentially CTO). Ost 1st Sept lesion, 90% stenosed. 4. Mid RCA lesion, 35% stenosed. Dist RCA lesion, 45% stenosed. RPDA lesion, 40% stenosed. 5. Ost 3rd Mrg to 3rd Mrg lesion, 60% stenosed (stable to improved from 2014). Tiny Ost 2nd Mrg , 90% stenosed. 6.  There is no obvious culprit lesion for the patient's some unstable angina. She indeed does have diffuse disease in the LAD but no significant change from before. The dyspnea no see in the distal circumflex-OM 2 as well as RCA appeared to be if anything improved from her last catheterization. She does have several focal stenoses and ostial parts of small branches including a small OM 2 and septal perforator.   Recommendations:  Standard post radial cath TR band removal.  Transfer to telemetry for titration of  antianginal medications. Is leery of discharge today without some adjustment of her medications which make sense.  Discontinue IV heparin. Would likely titrate up Imdur and potentially adjust antihypertensives agents pending her blood pressure evaluation post cath.  Would expect discharge tomorrow once stable.   Marykay Lex, M.D.,  M.S. Interventional Cardiologist  Pager # [phone]  Findings Coronary Findings Diagnostic  Dominance: Right  Left Main The vessel was injected is large . TIG 4.0 Catheter used.  Left Anterior Descending The vessel is normal in caliber . There is severe diffuse disease throughout the vessel. from just after D1 takeoff, the vessel appears to be small  &amp; tapers to occlusion in the mid vessel     The vessel was previously described as appearing like a spontaneous coronary dissection.  It looks almost identical to 2014. diffuse chronic total occlusion and has bridging right-to-left left-to-left collateral flow.  First Diagonal Branch The vessel is moderate in size.  First Septal Branch The vessel is moderate in size. discrete located at the major branch .  Ramus Intermedius The vessel is small .  Left Circumflex The vessel is large .  First Obtuse Marginal Branch The vessel is moderate in size.  Second Obtuse Marginal Branch The vessel is small in size. discrete located at the major branch .   Too Small to consider PCI  Third Obtuse Marginal Branch The vessel is large in size. diffuse eccentric .   Stable to improved from 11/2012 (was called ~70% at that time)  Lateral Third Obtuse Marginal Branch The vessel is small in size.  Right Coronary Artery The vessel was injected is normal in caliber . TIG 4.0 Catheter used.   The vessel exhibits minimal luminal irregularities. tubular . discrete located at the bend .  Acute Marginal Branch The vessel is small in size.  Right Posterior Descending Artery The vessel is normal in size and exhibits minimal luminal irregularities. Essentially identical to 2014 discrete .  Right Posterior Atrioventricular Artery The vessel is small in size.  Intervention  No interventions have been documented.   STRESS TESTS  NM MYOCAR MULTI W/SPECT W 12/30/2012   ECHOCARDIOGRAM  ECHOCARDIOGRAM COMPLETE  10/13/2020  Narrative ECHOCARDIOGRAM REPORT    Patient Name:   [name] Date of Exam: 10/13/2020 Medical Rec #:  [id number]           Height:       63.0 in Accession #:    [id number]          Weight:       220.4 lb Date of Birth:  1956-03-05           BSA:          2.015 m Patient Age:    64 years            BP:           118/71 mmHg Patient Gender: F                   HR:           66 bpm. Exam Location:  [address]  Procedure: 2D Echo, Cardiac Doppler and Color Doppler  Indications:    I05.9 Mitral Valve Disorder  History:        Patient has prior history of Echocardiogram examinations, most recent 10/10/2019. Risk Factors:Hypertension. Myocardial infarction. Coronary artery disease. Hypothyroidism.  Sonographer:    Daphine Deutscher RDCS Referring Phys: [id number] JONATHAN J BERRY  IMPRESSIONS  1. Left ventricular ejection fraction, by estimation, is 55 to 60%. The left ventricle has normal function. The left ventricle demonstrates regional wall motion abnormalities (see scoring diagram/findings for description). There is hypokinesis of the mid anteroseptal wall most notable on the short axis view. There is moderate concentric left ventricular hypertrophy. Left ventricular diastolic parameters are consistent with Grade I diastolic dysfunction (impaired relaxation). 2. Right ventricular systolic function is normal. The right ventricular size is normal. There is normal pulmonary artery systolic pressure. The estimated right ventricular systolic pressure is 25.3 mmHg. 3. Left atrial size was mildly dilated. 4. The mitral valve is grossly normal. Mild mitral valve regurgitation. 5. The aortic valve is tricuspid. There is mild calcification of the aortic valve. There is mild thickening of the aortic valve. Aortic valve regurgitation is not visualized. Mild to moderate aortic valve sclerosis/calcification is present, without any evidence of aortic stenosis. 6. Aortic  dilatation noted. There is mild dilatation of the ascending aorta, measuring 36 mm. 7. The inferior vena cava is normal in size with greater than 50% respiratory variability, suggesting right atrial pressure of 3 mmHg.  Comparison(s): Compared to prior study in 10/2019, the LVEF appears normal 55-60% and the MR appears mild.  FINDINGS Left Ventricle: Left ventricular ejection fraction, by estimation, is 55 to 60%. The left ventricle has normal function. The left ventricle demonstrates regional wall motion abnormalities. There is hypokinesis of the mid anteroseptal wall most notable on the short axis view. The left ventricular internal cavity size was normal in size. There is moderate concentric left ventricular hypertrophy. Left ventricular diastolic parameters are consistent with Grade I diastolic dysfunction (impaired relaxation).  Right Ventricle: The right ventricular size is normal. No increase in right ventricular wall thickness. Right ventricular systolic function is normal. There is normal pulmonary artery systolic pressure. The tricuspid regurgitant velocity is 2.36 m/s, and with an assumed right atrial pressure of 3 mmHg, the estimated right ventricular systolic pressure is 25.3 mmHg.  Left Atrium: Left atrial size was mildly dilated.  Right Atrium: Right atrial size was normal in size.  Pericardium: There is no evidence of pericardial effusion.  Mitral Valve: The mitral valve is grossly normal. There is mild thickening of the mitral valve leaflet(s). Mild mitral annular calcification. Mild mitral valve regurgitation.  Tricuspid Valve: The tricuspid valve is normal in structure. Tricuspid valve regurgitation is trivial.  Aortic Valve: The aortic valve is tricuspid. There is mild calcification of the aortic valve. There is mild thickening of the aortic valve. Aortic valve regurgitation is not visualized. Mild to moderate aortic valve sclerosis/calcification is present, without any  evidence of aortic stenosis.  Pulmonic Valve: The pulmonic valve was normal in structure. Pulmonic valve regurgitation is trivial.  Aorta: Aortic dilatation noted. There is mild dilatation of the ascending aorta, measuring 36 mm.  Venous: The inferior vena cava is normal in size with greater than 50% respiratory variability, suggesting right atrial pressure of 3 mmHg.  IAS/Shunts: No atrial level shunt detected by color flow Doppler.   LEFT VENTRICLE PLAX 2D LVIDd:         4.60 cm  Diastology LVIDs:         2.70 cm  LV e' medial:    6.53 cm/s LV PW:         1.00 cm  LV E/e' medial:  11.7 LV IVS:        0.90 cm  LV e' lateral:   10.30 cm/s LVOT diam:     2.00  cm  LV E/e' lateral: 7.4 LV SV:         58 LV SV Index:   29 LVOT Area:     3.14 cm   RIGHT VENTRICLE RV Basal diam:  2.80 cm RV S prime:     15.50 cm/s  LEFT ATRIUM             Index       RIGHT ATRIUM          Index LA diam:        4.60 cm 2.28 cm/m  RA Area:     9.28 cm LA Vol (A2C):   30.5 ml 15.14 ml/m RA Volume:   18.60 ml 9.23 ml/m LA Vol (A4C):   39.4 ml 19.55 ml/m LA Biplane Vol: 37.6 ml 18.66 ml/m AORTIC VALVE LVOT Vmax:   85.00 cm/s LVOT Vmean:  61.700 cm/s LVOT VTI:    0.184 m  AORTA Ao Root diam: 3.10 cm Ao Asc diam:  3.60 cm  MITRAL VALVE               TRICUSPID VALVE MV Area (PHT): 2.95 cm    TR Peak grad:   22.3 mmHg MV Decel Time: 257 msec    TR Vmax:        236.00 cm/s MV E velocity: 76.60 cm/s MV A velocity: 87.00 cm/s  SHUNTS MV E/A ratio:  0.88        Systemic VTI:  0.18 m Systemic Diam: 2.00 cm  Laurance Flatten MD Electronically signed by Laurance Flatten MD Signature Date/Time: 10/13/2020/2:05:34 PM    Final    MONITORS  LONG TERM MONITOR (3-14 DAYS) 12/11/2020  Narrative Patch Wear Time:  13 days and 9 hours (2022-04-02T11:05:43-0400 to 2022-04-15T20:32:29-0400)  Patient had a min HR of 53 bpm, max HR of 138 bpm, and avg HR of 69 bpm. Predominant underlying rhythm  was Sinus Rhythm. 1 run of Supraventricular Tachycardia occurred lasting 5 beats with a max rate of 132 bpm (avg 116 bpm). Isolated SVEs were rare (<1.0%), SVE Couplets were rare (<1.0%), and SVE Triplets were rare (<1.0%). Isolated VEs were rare (<1.0%), VE Couplets were rare (<1.0%), and no VE Triplets were present. Ventricular Trigeminy was present.  1. SR/SB/ST 2. Occasional PACs and PVCs 3. Short runs of SVT           Risk Assessment/Calculations:            Physical Exam:   VS:  BP 119/74 (BP Location: Left Arm, Patient Position: Sitting, Cuff Size: Large)   Pulse (!) 58   Ht 5\' 2"  (1.575 m)   Wt 218 lb 14.4 oz (99.3 kg)   BMI 40.04 kg/m    Wt Readings from Last 3 Encounters:  01/17/23 218 lb 14.4 oz (99.3 kg)  11/25/22 221 lb (100.2 kg)  10/04/22 221 lb 12.8 oz (100.6 kg)    GEN: Well nourished, well developed in no acute distress NECK: No JVD; No carotid bruits CARDIAC: RRR, no murmurs, rubs, gallops RESPIRATORY:  Clear to auscultation without rales, wheezing or rhonchi  ABDOMEN: Soft, non-tender, non-distended EXTREMITIES:  No edema; No deformity   ASSESSMENT AND PLAN: .    Chest pain / CAD - Concern for angina as episode 4 days ago with chest pain, dyspnea, nausea. Discussed LHC vs myoview. She prefers to proceed with myoview which we will order today. She is aware if myoview unremarkable will require LHC.  EKG today unchanged from previous. GDMT aspirin, Plavix, Repatha,  Zetia, Ranexa. Low suspicion she would tolerate Imdur has had headache with nitroglycerin.  Recommend aiming for 150 minutes of moderate intensity activity per week and following a heart healthy diet.    HTN - BP well controlled. Continue current antihypertensive regimen.  Discussed to monitor BP at home at least 2 hours after medications and sitting for 5-10 minutes.   HLD - statin intolerance. Continue Repatha. Follows with Dr. Rennis Golden.    Informed Consent   Shared Decision Making/Informed  Consent The risks [chest pain, shortness of breath, cardiac arrhythmias, dizziness, blood pressure fluctuations, myocardial infarction, stroke/transient ischemic attack, nausea, vomiting, allergic reaction, radiation exposure, metallic taste sensation and life-threatening complications (estimated to be 1 in 10,000)], benefits (risk stratification, diagnosing coronary artery disease, treatment guidance) and alternatives of a nuclear stress test were discussed in detail with Ms. Canney and she agrees to proceed.     Dispo: follow up in 6 weeks  Signed, Alver Sorrow, NP

## 2023-01-18 ENCOUNTER — Telehealth (HOSPITAL_COMMUNITY): Payer: Self-pay | Admitting: *Deleted

## 2023-01-18 NOTE — Telephone Encounter (Signed)
Pt reached and given instructions for MPI study. 

## 2023-01-19 ENCOUNTER — Encounter (HOSPITAL_BASED_OUTPATIENT_CLINIC_OR_DEPARTMENT_OTHER): Payer: Self-pay | Admitting: Family

## 2023-01-20 ENCOUNTER — Ambulatory Visit (HOSPITAL_COMMUNITY): Payer: Medicare Other | Attending: Internal Medicine

## 2023-01-20 DIAGNOSIS — I25118 Atherosclerotic heart disease of native coronary artery with other forms of angina pectoris: Secondary | ICD-10-CM | POA: Diagnosis present

## 2023-01-20 DIAGNOSIS — E785 Hyperlipidemia, unspecified: Secondary | ICD-10-CM

## 2023-01-20 LAB — MYOCARDIAL PERFUSION IMAGING
LV dias vol: 72 mL (ref 46–106)
LV sys vol: 27 mL
Nuc Stress EF: 62 %
Peak HR: 76 {beats}/min
Rest HR: 53 {beats}/min
Rest Nuclear Isotope Dose: 10.6 mCi
SDS: 1
SRS: 4
SSS: 5
ST Depression (mm): 0 mm
Stress Nuclear Isotope Dose: 32 mCi
TID: 1.12

## 2023-01-20 MED ORDER — REGADENOSON 0.4 MG/5ML IV SOLN
0.4000 mg | Freq: Once | INTRAVENOUS | Status: AC
Start: 2023-01-20 — End: 2023-01-20
  Administered 2023-01-20: 0.4 mg via INTRAVENOUS

## 2023-01-20 MED ORDER — TECHNETIUM TC 99M TETROFOSMIN IV KIT
10.6000 | PACK | Freq: Once | INTRAVENOUS | Status: AC | PRN
  Administered 2023-01-20: 10.6 via INTRAVENOUS

## 2023-01-20 MED ORDER — TECHNETIUM TC 99M TETROFOSMIN IV KIT
32.0000 | PACK | Freq: Once | INTRAVENOUS | Status: AC | PRN
  Administered 2023-01-20: 32 via INTRAVENOUS

## 2023-01-24 ENCOUNTER — Encounter: Payer: Self-pay | Admitting: Family Medicine

## 2023-01-27 ENCOUNTER — Encounter: Payer: Self-pay | Admitting: Internal Medicine

## 2023-02-01 ENCOUNTER — Ambulatory Visit (HOSPITAL_BASED_OUTPATIENT_CLINIC_OR_DEPARTMENT_OTHER): Payer: Medicare Other | Admitting: Internal Medicine

## 2023-02-01 NOTE — Progress Notes (Unsigned)
[facility] [address] [place], [place] [address] [phone]  Date:  02/02/2023   Name:  [name]   DOB:  Sep 22, 1955   MRN:  [id number]  PCP:  Sabrina Cables, MD    Chief Complaint: No chief complaint on file.   History of Present Illness:  [name] is a 67 y.o. very pleasant female patient who presents with the following:  Patient seen today with concern of fatigue Most recent visit with myself was in February for sick visit History of HTN, CAD/NSTEMI, obesity, hyperlipidemia, IBS, hypothyroidism, GI bleed 2022, chronic moderate renal insufficiency  Most recent visit with nephrology was in February Shingrix not done yet Needs pneumonia  She is followed by cardiology, most recent visit in mid June after a concerning episode of chest tightness and dizziness Chest pain / CAD - Concern for angina as episode 4 days ago with chest pain, dyspnea, nausea. Discussed LHC vs myoview. She prefers to proceed with myoview which we will order today. She is aware if myoview unremarkable will require LHC.  EKG today unchanged from previous. GDMT aspirin, Plavix, Repatha, Zetia, Ranexa. Low suspicion she would tolerate Imdur has had headache with nitroglycerin.  Recommend aiming for 150 minutes of moderate intensity activity per week and following a heart healthy diet.   HTN - BP well controlled. Continue current antihypertensive regimen.  Discussed to monitor BP at home at least 2 hours after medications and sitting for 5-10 minutes.  HLD - statin intolerance. Continue Repatha. Follows with Dr. Rennis Golden.  She did get a repeat Myoview done on 01/20/2023-reassuring   Findings are consistent with no ischemia. The study is low risk.   No ST deviation was noted.   LV perfusion is abnormal. Defect 1: There is a small defect with moderate reduction in uptake present in the apical to mid septal and apex location(s)  that is fixed. Viability is absent. There is normal wall motion in the defect area. Consistent with infarction and artifact.   Left ventricular function is normal. Nuclear stress EF: 62 %. The left ventricular ejection fraction is normal (55-65%). End diastolic cavity size is normal. End systolic cavity size is normal.   Prior study available for comparison from 12/28/2012. Anteroapical scar without ischemia, LVEF 55%, low risk Small size, moderate intensity mostly fixed (SDS 1) apical and apical septal perfusion defect, suggestive of scar, or less likely attenuation artifact. No reversible ischemia. LVEF 62% with normal wall motion. This is a low risk study. Compared to a prior study in 2014, the LVEF is higher, but the perfusion defect appears similar.  Most recent lab work on chart from February-creatinine 1.2    Patient Active Problem List   Diagnosis Date Noted   Acute GI bleeding 04/17/2021   Colitis 04/17/2021   Lower abdominal pain 01/28/2021   Dyspnea on exertion 10/24/2019   Pre-diabetes 01/14/2017   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   Tachycardia, somewhat irregular, episodic 10/08/2013   Dizziness 10/08/2013   Angina effort 05/22/2013   Unstable angina (HCC) 01/22/2013   Hyperlipidemia 01/01/2013   CAD (coronary artery disease), possible SCAD 12/14/2012   NSTEMI (non-ST elevated myocardial infarction) (HCC) 12/10/2012   Essential hypertension 12/10/2012   Hypothyroidism 12/10/2012   Obesity (BMI 35.0-39.9 without comorbidity) 12/10/2012   Gastroenteritis 10/08/2011   Acute ischemic colitis (HCC) 08/09/2011   DYSPHAGIA UNSPECIFIED 07/01/2010   RECTAL BLEEDING Jan 2013 11/04/2009   GERD  02/11/2009   Irritable bowel syndrome 02/11/2009   Generalized abdominal pain 02/11/2009   PERSONAL HX COLONIC POLYPS 02/11/2009    Past Medical History:  Diagnosis Date   Angina effort 05/22/2013   Anxiety    CAD (coronary artery disease), possible SCAD 12/14/2012   Colon  polyps    Duodenitis    Dyspnea on exertion    LEXISCAN, 09/30/2008 - normal, EKG negative for ischemia, no ECG changes   Esophageal stricture    Gastritis    GERD (gastroesophageal reflux disease)    Gout    "only from RX given after MI" (01/22/2013)   H/O hiatal hernia    History of esophageal stricture    Hypertension    2D ECHO, 09/30/2008 - EF >55%, normal: RENAL DOPPLER, 03/21/2007 - normal duplex study   Hypothyroidism    IBS (irritable bowel syndrome)    Ischemic colitis (HCC)    Lower GI bleeding    10/07/11 "I've had 3 episodes in the past year"   Myocardial infarction (HCC) 12/10/2012   "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)   Tubular adenoma polyp of rectum 2008    Past Surgical History:  Procedure Laterality Date   ABDOMINAL HYSTERECTOMY  1999   ANTERIOR LUMBAR FUSION  2010   "L4-5" (01/22/2013)   CARDIAC CATHETERIZATION N/A 02/06/2015   Procedure: Left Heart Cath and Coronary Angiography;  Surgeon: Marykay Lex, MD;  Location: [facility] INVASIVE CV LAB;  Service: Cardiovascular;  Laterality: N/A;   ESOPHAGEAL DILATION  08/2010   ESOPHAGEAL DILATION     "once or twice" (01/22/2013)   HAMMER TOE SURGERY Bilateral ~ 2002   LEFT HEART CATHETERIZATION WITH CORONARY ANGIOGRAM N/A 12/11/2012   Procedure: LEFT HEART CATHETERIZATION WITH CORONARY ANGIOGRAM;  Surgeon: Marykay Lex, MD;  Location: [facility] C[facility] LAB;  Service: Cardiovascular;  Laterality: N/A;   LEFT HEART CATHETERIZATION WITH CORONARY ANGIOGRAM N/A 12/15/2012   Procedure: LEFT HEART CATHETERIZATION WITH CORONARY ANGIOGRAM;  Surgeon: Marykay Lex, MD;  Location: [facility] CATH LAB;  Service: Cardiovascular;  Laterality: N/A;   PERIPHERAL VENOUS STUDY  10/03/2008   No evidence of DVT, superficial thrombosis, or Baker's cyst   TONSILLECTOMY AND ADENOIDECTOMY  1960's   TUBAL LIGATION  1985    Social History   Tobacco Use   Smoking status: Never   Smokeless tobacco: Never  Vaping Use   Vaping Use: Never used   Substance Use Topics   Alcohol use: No   Drug use: No    Family History  Problem Relation Age of Onset   Colon cancer Brother 30   Kidney disease Mother    Stroke Father    Multiple myeloma Sister    Heart attack Brother    Other Brother    Other Brother     No Known Allergies  Medication list has been reviewed and updated.  Current Outpatient Medications on File Prior to Visit  Medication Sig Dispense Refill   allopurinol (ZYLOPRIM) 100 MG tablet Take 3 tablets (300 mg total) by mouth daily. 270 tablet 3   ALPRAZolam (XANAX) 0.5 MG tablet TAKE ONE TABLET BY MOUTH THREE TIMES DAILY AS NEEDED FOR ANXIETY 90 tablet 1   amLODipine (NORVASC) 5 MG tablet Take 1 tablet (5 mg total) by mouth daily. 90 tablet 3   aspirin EC 81 MG tablet Take 81 mg by mouth daily. Swallow whole.     clobetasol (TEMOVATE) 0.05 % external solution Apply topically.     clopidogrel (PLAVIX) 75 MG tablet take  1 tablet by mouth once a day with breakfast 90 tablet 2   dicyclomine (BENTYL) 10 MG capsule Take 2 capsules (20 mg total) by mouth 4 (four) times daily -  before meals and at bedtime. 240 capsule 0   estradiol (ESTRACE) 0.5 MG tablet Take 0.5 mg by mouth daily.     Evolocumab (REPATHA SURECLICK) 140 MG/ML SOAJ Inject 140 MG into the skin once every 14 days 2 mL 11   ezetimibe (ZETIA) 10 MG tablet Take 1 tablet (10 mg total) by mouth daily. 90 tablet 3   furosemide (LASIX) 20 MG tablet Take 1 tablet (20 mg total) by mouth daily. 90 tablet 3   lisinopril (ZESTRIL) 5 MG tablet TAKE ONE TABLET BY MOUTH ONE TIME DAILY 90 tablet 3   metoprolol tartrate (LOPRESSOR) 25 MG tablet Take 1 tablet (25 mg total) by mouth 2 (two) times daily. 180 tablet 3   nitroGLYCERIN (NITROSTAT) 0.4 MG SL tablet DISSOLVE 1 TABLET UNDER THE TONGUE EVERY 5 MINUTES AS NEEDED FOR CHEST PAIN 25 tablet 3   pantoprazole (PROTONIX) 40 MG tablet TAKE ONE TABLET BY MOUTH ONE TIME DAILY 90 tablet 3   pimecrolimus (ELIDEL) 1 % cream       predniSONE (DELTASONE) 20 MG tablet Take one by mouth daily for 3-5 days as needed for gout flare 30 tablet 1   ranolazine (RANEXA) 1000 MG SR tablet TAKE ONE TABLET BY MOUTH TWICE DAILY 180 tablet 0   No current facility-administered medications on file prior to visit.    Review of Systems:  As per HPI- otherwise negative.   Physical Examination: There were no vitals filed for this visit. There were no vitals filed for this visit. There is no height or weight on file to calculate BMI. Ideal Body Weight:    GEN: no acute distress. HEENT: Atraumatic, Normocephalic.  Ears and Nose: No external deformity. CV: RRR, No M/G/R. No JVD. No thrill. No extra heart sounds. PULM: CTA B, no wheezes, crackles, rhonchi. No retractions. No resp. distress. No accessory muscle use. ABD: S, NT, ND, +BS. No rebound. No HSM. EXTR: No c/c/e PSYCH: Normally interactive. Conversant.    Assessment and Plan: ***  Signed Abbe Amsterdam, MD

## 2023-02-01 NOTE — Patient Instructions (Incomplete)
It was good to see you again today, I will be in touch with your labs Recommend getting the shingles vaccine series at your pharmacy

## 2023-02-02 ENCOUNTER — Encounter: Payer: Self-pay | Admitting: Family Medicine

## 2023-02-02 ENCOUNTER — Ambulatory Visit (INDEPENDENT_AMBULATORY_CARE_PROVIDER_SITE_OTHER): Payer: Medicare Other | Admitting: Family Medicine

## 2023-02-02 VITALS — BP 112/60 | HR 60 | Temp 97.9°F | Resp 18 | Ht 63.0 in | Wt 220.4 lb

## 2023-02-02 DIAGNOSIS — M2550 Pain in unspecified joint: Secondary | ICD-10-CM

## 2023-02-02 DIAGNOSIS — E559 Vitamin D deficiency, unspecified: Secondary | ICD-10-CM

## 2023-02-02 DIAGNOSIS — R7303 Prediabetes: Secondary | ICD-10-CM | POA: Diagnosis not present

## 2023-02-02 DIAGNOSIS — D649 Anemia, unspecified: Secondary | ICD-10-CM | POA: Diagnosis not present

## 2023-02-02 DIAGNOSIS — I10 Essential (primary) hypertension: Secondary | ICD-10-CM | POA: Diagnosis not present

## 2023-02-02 DIAGNOSIS — R5383 Other fatigue: Secondary | ICD-10-CM

## 2023-02-02 DIAGNOSIS — E669 Obesity, unspecified: Secondary | ICD-10-CM

## 2023-02-02 LAB — COMPREHENSIVE METABOLIC PANEL
ALT: 12 U/L (ref 0–35)
AST: 13 U/L (ref 0–37)
Albumin: 3.8 g/dL (ref 3.5–5.2)
Alkaline Phosphatase: 82 U/L (ref 39–117)
BUN: 19 mg/dL (ref 6–23)
CO2: 29 mEq/L (ref 19–32)
Calcium: 9.9 mg/dL (ref 8.4–10.5)
Chloride: 99 mEq/L (ref 96–112)
Creatinine, Ser: 1.44 mg/dL — ABNORMAL HIGH (ref 0.40–1.20)
GFR: 37.81 mL/min — ABNORMAL LOW (ref >60.00)
Glucose, Bld: 107 mg/dL — ABNORMAL HIGH (ref 70–99)
Potassium: 4.3 mEq/L (ref 3.5–5.1)
Sodium: 136 mEq/L (ref 135–145)
Total Bilirubin: 0.5 mg/dL (ref 0.2–1.2)
Total Protein: 6.7 g/dL (ref 6.0–8.3)

## 2023-02-02 LAB — CBC
HCT: 37.6 % (ref 36.0–46.0)
Hemoglobin: 12.4 g/dL (ref 12.0–15.0)
MCHC: 33 g/dL (ref 30.0–36.0)
MCV: 93.8 fl (ref 78.0–100.0)
Platelets: 276 10*3/uL (ref 150.0–400.0)
RBC: 4 Mil/uL (ref 3.87–5.11)
RDW: 14 % (ref 11.5–15.5)
WBC: 6.2 10*3/uL (ref 4.0–10.5)

## 2023-02-02 LAB — HEMOGLOBIN A1C: Hgb A1c MFr Bld: 5.5 % (ref 4.6–6.5)

## 2023-02-02 LAB — VITAMIN D 25 HYDROXY (VIT D DEFICIENCY, FRACTURES): VITD: 30.08 ng/mL (ref 30.00–100.00)

## 2023-02-02 LAB — VITAMIN B12: Vitamin B-12: 180 pg/mL — ABNORMAL LOW (ref 211–911)

## 2023-02-02 LAB — TSH: TSH: 3.7 u[IU]/mL (ref 0.35–5.50)

## 2023-02-02 LAB — SEDIMENTATION RATE: Sed Rate: 13 mm/hr (ref 0–30)

## 2023-02-02 LAB — FERRITIN: Ferritin: 120.1 ng/mL (ref 10.0–291.0)

## 2023-02-06 ENCOUNTER — Other Ambulatory Visit (HOSPITAL_BASED_OUTPATIENT_CLINIC_OR_DEPARTMENT_OTHER): Payer: Self-pay | Admitting: Internal Medicine

## 2023-02-28 ENCOUNTER — Ambulatory Visit (HOSPITAL_BASED_OUTPATIENT_CLINIC_OR_DEPARTMENT_OTHER): Payer: Medicare Other | Admitting: Family

## 2023-02-28 ENCOUNTER — Encounter: Payer: Self-pay | Admitting: Family Medicine

## 2023-02-28 DIAGNOSIS — Z6839 Body mass index (BMI) 39.0-39.9, adult: Secondary | ICD-10-CM

## 2023-02-28 MED ORDER — ONDANSETRON HCL 4 MG PO TABS: 4.0000 mg | ORAL_TABLET | Freq: Three times a day (TID) | ORAL | 0 refills | Status: DC | PRN

## 2023-02-28 MED ORDER — WEGOVY 0.25 MG/0.5ML ~~LOC~~ SOAJ: 0.2500 mg | SUBCUTANEOUS | 1 refills | Status: DC

## 2023-02-28 NOTE — Telephone Encounter (Signed)
This PA was being handled via Fax. Rx has not been sent in yet since we were just trying to see what would be covered. Wegovy and Rx for nausea are needed.

## 2023-03-01 ENCOUNTER — Other Ambulatory Visit: Payer: Self-pay | Admitting: Cardiovascular Disease

## 2023-03-01 NOTE — Telephone Encounter (Signed)
FYIReginal Day has been approved.  Approval number: n/a Dates approved:  11/19/22 - 02/17/2024

## 2023-03-10 LAB — BASIC METABOLIC PANEL
BUN: 12 (ref 4–21)
CO2: 27 — AB (ref 13–22)
Chloride: 100 (ref 99–108)
Creatinine: 1.2 — AB (ref 0.5–1.1)
Glucose: 91
Potassium: 4.2 mEq/L (ref 3.5–5.1)
Sodium: 135 — AB (ref 137–147)

## 2023-03-10 LAB — CBC AND DIFFERENTIAL
HCT: 38 (ref 36–46)
Hemoglobin: 12.7 (ref 12.0–16.0)
Platelets: 294 10*3/uL (ref 150–400)
WBC: 6.8

## 2023-03-10 LAB — COMPREHENSIVE METABOLIC PANEL
Albumin: 4.2 (ref 3.5–5.0)
Calcium: 10.1 (ref 8.7–10.7)
eGFR: 50

## 2023-03-11 LAB — LAB REPORT - SCANNED
Albumin, Urine POC: <3
Creatinine, POC: 70.3 mg/dL
EGFR: 50
Microalb Creat Ratio: <4

## 2023-03-16 ENCOUNTER — Other Ambulatory Visit: Payer: Self-pay

## 2023-03-16 DIAGNOSIS — R7303 Prediabetes: Secondary | ICD-10-CM

## 2023-03-16 MED ORDER — WEGOVY 0.5 MG/0.5ML ~~LOC~~ SOAJ: 0.5000 mg | SUBCUTANEOUS | 0 refills | Status: DC

## 2023-04-07 ENCOUNTER — Ambulatory Visit (INDEPENDENT_AMBULATORY_CARE_PROVIDER_SITE_OTHER): Payer: Medicare Other | Admitting: Gastroenterology

## 2023-04-07 ENCOUNTER — Encounter: Payer: Self-pay | Admitting: Gastroenterology

## 2023-04-07 ENCOUNTER — Other Ambulatory Visit: Payer: Self-pay | Admitting: Cardiovascular Disease

## 2023-04-07 VITALS — BP 110/70 | HR 80 | Ht 61.25 in | Wt 215.4 lb

## 2023-04-07 DIAGNOSIS — Z7902 Long term (current) use of antithrombotics/antiplatelets: Secondary | ICD-10-CM

## 2023-04-07 DIAGNOSIS — Z800 Family history of malignant neoplasm of digestive organs: Secondary | ICD-10-CM | POA: Diagnosis not present

## 2023-04-07 DIAGNOSIS — Z86010 Personal history of colonic polyps: Secondary | ICD-10-CM | POA: Diagnosis not present

## 2023-04-07 DIAGNOSIS — K222 Esophageal obstruction: Secondary | ICD-10-CM

## 2023-04-07 DIAGNOSIS — R1310 Dysphagia, unspecified: Secondary | ICD-10-CM

## 2023-04-07 MED ORDER — NA SULFATE-K SULFATE-MG SULF 17.5-3.13-1.6 GM/177ML PO SOLN
1.0000 | Freq: Once | ORAL | 0 refills | Status: AC
Start: 2023-04-07 — End: 2023-04-07

## 2023-04-07 NOTE — Patient Instructions (Addendum)
You have been scheduled for an endoscopy and colonoscopy. Please follow the written instructions given to you at your visit today.  Please pick up your prep supplies at the pharmacy within the next 1-3 days.  If you use inhalers (even only as needed), please bring them with you on the day of your procedure.  DO NOT TAKE 7 DAYS PRIOR TO TEST- Trulicity (dulaglutide) Ozempic, Wegovy (semaglutide) Mounjaro (tirzepatide) Bydureon Bcise (exanatide extended release)  DO NOT TAKE 1 DAY PRIOR TO YOUR TEST Rybelsus (semaglutide) Adlyxin (lixisenatide) Victoza (liraglutide) Byetta (exanatide)  _______________________________________________________  If your blood pressure at your visit was 140/90 or greater, please contact your primary care physician to follow up on this.  _______________________________________________________  If you are age 69 or older, your body mass index should be between 23-30. Your Body mass index is 40.36 kg/m. If this is out of the aforementioned range listed, please consider follow up with your Primary Care Provider.  If you are age 54 or younger, your body mass index should be between 19-25. Your Body mass index is 40.36 kg/m. If this is out of the aformentioned range listed, please consider follow up with your Primary Care Provider.   ________________________________________________________  The [facility] providers would like to encourage you to use [facility] to communicate with providers for non-urgent requests or questions.  Due to long hold times on the telephone, sending your provider a message by [facility] may be a faster and more efficient way to get a response.  Please allow 48 business hours for a response.  Please remember that this is for non-urgent requests.  _______________________________________________________

## 2023-04-07 NOTE — Progress Notes (Signed)
04/07/2023 [name] [id number] 1955/10/18   HISTORY OF PRESENT ILLNESS: This is a 67 year old female who is a patient of Dr. Lamar Sprinkles.  She has past medical history of coronary artery disease, hypertension, hyperlipidemia, chronic kidney disease stage III, GERD, hiatal hernia/stricture status post previous dilation, history of colon polyps, chronic constipation, history of ischemic colitis.  She is here today to schedule colonoscopy.  Last colonoscopy was August 2019 at which time she had 2 polyps that were 1 to 3 mm in size were consistent with adenomas.  She does have a family history of colon cancer in her brother.  In regards to her constipation she is on Citrucel daily.  Has started [facility] and has noticed maybe slightly worsening constipation since beginning that.  She would also like to have repeat EGD, stating that she has been noticing intermittent issues with solid food getting stuck.  Last EGD August 2019 with a benign-appearing esophageal stenosis that was dilated.  Is on Plavix by Dr. Allyson Sabal.  Past Medical History:  Diagnosis Date   Angina effort 05/22/2013   Anxiety    CAD (coronary artery disease), possible SCAD 12/14/2012   Colon polyps    Duodenitis    Dyspnea on exertion    LEXISCAN, 09/30/2008 - normal, EKG negative for ischemia, no ECG changes   Esophageal stricture    Gastritis    GERD (gastroesophageal reflux disease)    Gout    "only from RX given after MI" (01/22/2013)   H/O hiatal hernia    History of esophageal stricture    Hypertension    2D ECHO, 09/30/2008 - EF >55%, normal: RENAL DOPPLER, 03/21/2007 - normal duplex study   Hypothyroidism    IBS (irritable bowel syndrome)    Ischemic colitis (HCC)    Lower GI bleeding    10/07/11 "I've had 3 episodes in the past year"   Myocardial infarction (HCC) 12/10/2012   "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)   Tubular adenoma polyp of rectum 2008   Past Surgical History:  Procedure  Laterality Date   ABDOMINAL HYSTERECTOMY  1999   ANTERIOR LUMBAR FUSION  2010   "L4-5" (01/22/2013)   CARDIAC CATHETERIZATION N/A 02/06/2015   Procedure: Left Heart Cath and Coronary Angiography;  Surgeon: Marykay Lex, MD;  Location: [facility] INVASIVE CV LAB;  Service: Cardiovascular;  Laterality: N/A;   ESOPHAGEAL DILATION  08/2010   ESOPHAGEAL DILATION     "once or twice" (01/22/2013)   HAMMER TOE SURGERY Bilateral ~ 2002   LEFT HEART CATHETERIZATION WITH CORONARY ANGIOGRAM N/A 12/11/2012   Procedure: LEFT HEART CATHETERIZATION WITH CORONARY ANGIOGRAM;  Surgeon: Marykay Lex, MD;  Location: [facility] CATH LAB;  Service: Cardiovascular;  Laterality: N/A;   LEFT HEART CATHETERIZATION WITH CORONARY ANGIOGRAM N/A 12/15/2012   Procedure: LEFT HEART CATHETERIZATION WITH CORONARY ANGIOGRAM;  Surgeon: Marykay Lex, MD;  Location: [facility] CATH LAB;  Service: Cardiovascular;  Laterality: N/A;   PERIPHERAL VENOUS STUDY  10/03/2008   No evidence of DVT, superficial thrombosis, or Baker's cyst   TONSILLECTOMY AND ADENOIDECTOMY  1960's   TUBAL LIGATION  1985    reports that she has never smoked. She has never used smokeless tobacco. She reports that she does not drink alcohol and does not use drugs. family history includes Colon cancer (age of onset: 59) in her brother; Heart attack in her brother; Kidney disease in her mother; Multiple myeloma in her sister; Other in her brother and brother; Stroke in her father. No Known  Allergies    Outpatient Encounter Medications as of 04/07/2023  Medication Sig   allopurinol (ZYLOPRIM) 100 MG tablet Take 3 tablets (300 mg total) by mouth daily.   ALPRAZolam (XANAX) 0.5 MG tablet TAKE ONE TABLET BY MOUTH THREE TIMES DAILY AS NEEDED FOR ANXIETY   amLODipine (NORVASC) 5 MG tablet TAKE ONE TABLET BY MOUTH ONE TIME DAILY   aspirin EC 81 MG tablet Take 81 mg by mouth daily. Swallow whole.   clobetasol (TEMOVATE) 0.05 % external solution Apply topically.   clopidogrel (PLAVIX)  75 MG tablet take 1 tablet by mouth once a day with breakfast   dicyclomine (BENTYL) 10 MG capsule Take 2 capsules (20 mg total) by mouth 4 (four) times daily -  before meals and at bedtime.   estradiol (ESTRACE) 0.5 MG tablet Take 0.5 mg by mouth daily.   Evolocumab (REPATHA SURECLICK) 140 MG/ML SOAJ Inject 140 MG into the skin once every 14 days   ezetimibe (ZETIA) 10 MG tablet TAKE ONE TABLET BY MOUTH ONE TIME DAILY   lisinopril (ZESTRIL) 5 MG tablet TAKE ONE TABLET BY MOUTH ONE TIME DAILY   metoprolol tartrate (LOPRESSOR) 25 MG tablet Take 1 tablet (25 mg total) by mouth 2 (two) times daily.   ondansetron (ZOFRAN) 4 MG tablet Take 1 tablet (4 mg total) by mouth every 8 (eight) hours as needed for nausea or vomiting.   pantoprazole (PROTONIX) 40 MG tablet TAKE 1 TABLET BY MOUTH ONCE DAILY   pimecrolimus (ELIDEL) 1 % cream    ranolazine (RANEXA) 1000 MG SR tablet TAKE ONE TABLET BY MOUTH TWICE DAILY   Semaglutide-Weight Management (WEGOVY) 0.5 MG/0.5ML SOAJ Inject 0.5 mg into the skin once a week.   tretinoin (RETIN-A) 0.1 % cream Apply 1 Application topically at bedtime.   furosemide (LASIX) 20 MG tablet Take 1 tablet (20 mg total) by mouth daily.   nitroGLYCERIN (NITROSTAT) 0.4 MG SL tablet DISSOLVE 1 TABLET UNDER THE TONGUE EVERY 5 MINUTES AS NEEDED FOR CHEST PAIN (Patient not taking: Reported on 04/07/2023)   predniSONE (DELTASONE) 20 MG tablet Take one by mouth daily for 3-5 days as needed for gout flare (Patient not taking: Reported on 04/07/2023)   [DISCONTINUED] pantoprazole (PROTONIX) 40 MG tablet TAKE ONE TABLET BY MOUTH ONE TIME DAILY   No facility-administered encounter medications on file as of 04/07/2023.    REVIEW OF SYSTEMS  : All other systems reviewed and negative except where noted in the History of Present Illness.   PHYSICAL EXAM: Ht 5' 1.25" (1.556 m)   Wt 215 lb 6 oz (97.7 kg)   BMI 40.36 kg/m  General: Well developed white female in no acute distress Head:  Normocephalic and atraumatic Eyes:  Sclerae anicteric, conjunctiva pink. Ears: Normal auditory acuity Lungs: Clear throughout to auscultation; no W/R/R. Heart: Regular rate and rhythm; no M/R/G. Rectal:  Will be done at the time of colonoscopy. Musculoskeletal: Symmetrical with no gross deformities  Skin: No lesions on visible extremities Extremities: No edema  Neurological: Alert oriented x 4, grossly non-focal Psychological:  Alert and cooperative. Normal mood and affect  ASSESSMENT AND PLAN: *Personal history of adenomatous colon polyps and family history of colon cancer in her brother:  Last colonoscopy 03/2018.  Will plan for colonoscopy with Dr. Marina Goodell.   *Constipation: This has been a chronic issue.  Currently on Citrucel but states that she drinks a lot of water.  Suggested that she add a dose of MiraLAX to her daily regimen as well. *Dysphagia:  Has recurrent solid food dysphagia.  Had EGD with dilation in 2019 for esophageal stenosis as well.  With recurrent intermittent symptoms.  She would like to plan for repeat EGD with dilation as well. *Antiplatelet use with Plavix due to history of coronary artery disease:  Hold Plavix for 5 days before procedure - will instruct when and how to resume after procedure. Risks and benefits of procedure including bleeding, perforation, infection, missed lesions, medication reactions and possible hospitalization or surgery if complications occur explained. Additional rare but real risk of cardiovascular event such as heart attack or ischemia/infarct of other organs off of Plavix explained and need to seek urgent help if this occurs. Will communicate by phone or EMR with patient's prescribing provider, Dr. Allyson Sabal, to confirm holding Plavix is reasonable in this case.     CC:  Copland, Gwenlyn Found, MD

## 2023-04-08 ENCOUNTER — Telehealth: Payer: Self-pay | Admitting: *Deleted

## 2023-04-08 ENCOUNTER — Telehealth: Payer: Self-pay

## 2023-04-08 NOTE — Telephone Encounter (Signed)
   Name: [name]  DOB: Feb 07, 1956  MRN: [id number]  Primary Cardiologist: Nanetta Batty, MD   Preoperative team, please contact this patient and set up a phone call appointment for further preoperative risk assessment. Please obtain consent and complete medication review. Thank you for your help.Last seen for cardiology evaluation in June, 2024.  I confirm that guidance regarding antiplatelet and oral anticoagulation therapy has been completed and, if necessary, noted below.  Per office protocol, if patient is without any new symptoms or concerns at the time of their virtual visit, he/she may hold ASA for 7 days and Plavix for 5 days prior to procedure. Please resume ASA and Plavix  as soon as possible postprocedure, at the discretion of the surgeon.    Joni Reining, NP 04/08/2023, 12:22 PM [facility]

## 2023-04-08 NOTE — Telephone Encounter (Signed)
1st attempt to reach pt to schedule tele vist for clearance. Lvm

## 2023-04-08 NOTE — Telephone Encounter (Signed)
[facility] Pre-operative Risk Assessment     Request for surgical clearance:     Endoscopy Procedure  What type of surgery is being performed?     EGD/Colon  When is this surgery scheduled?     06/06/23  What type of clearance is required ?   Pharmacy  Are there any medications that need to be held prior to surgery and how long? Plavix 5 days  Practice name and name of physician performing surgery?      [facility]  What is your office phone and fax number?      Phone- [phone]  Fax- [phone]  Anesthesia type (None, local, MAC, general) ?       MAC

## 2023-04-08 NOTE — Telephone Encounter (Signed)
Patient scheduled for tele visit on 05/23/23. Med rec and consent done

## 2023-04-08 NOTE — Telephone Encounter (Signed)
  Patient Consent for Virtual Visit         [name] has provided verbal consent on 04/08/2023 for a virtual visit (video or telephone).   CONSENT FOR VIRTUAL VISIT FOR:  [name]  By participating in this virtual visit I agree to the following:  I hereby voluntarily request, consent and authorize [facility] and its employed or contracted physicians, physician assistants, nurse practitioners or other licensed health care professionals (the Practitioner), to provide me with telemedicine health care services (the "Services") as deemed necessary by the treating Practitioner. I acknowledge and consent to receive the Services by the Practitioner via telemedicine. I understand that the telemedicine visit will involve communicating with the Practitioner through live audiovisual communication technology and the disclosure of certain medical information by electronic transmission. I acknowledge that I have been given the opportunity to request an in-person assessment or other available alternative prior to the telemedicine visit and am voluntarily participating in the telemedicine visit.  I understand that I have the right to withhold or withdraw my consent to the use of telemedicine in the course of my care at any time, without affecting my right to future care or treatment, and that the Practitioner or I may terminate the telemedicine visit at any time. I understand that I have the right to inspect all information obtained and/or recorded in the course of the telemedicine visit and may receive copies of available information for a reasonable fee.  I understand that some of the potential risks of receiving the Services via telemedicine include:  Delay or interruption in medical evaluation due to technological equipment failure or disruption; Information transmitted may not be sufficient (e.g. poor resolution of images) to allow for appropriate medical decision making by the  Practitioner; and/or  In rare instances, security protocols could fail, causing a breach of personal health information.  Furthermore, I acknowledge that it is my responsibility to provide information about my medical history, conditions and care that is complete and accurate to the best of my ability. I acknowledge that Practitioner's advice, recommendations, and/or decision may be based on factors not within their control, such as incomplete or inaccurate data provided by me or distortions of diagnostic images or specimens that may result from electronic transmissions. I understand that the practice of medicine is not an exact science and that Practitioner makes no warranties or guarantees regarding treatment outcomes. I acknowledge that a copy of this consent can be made available to me via my patient portal [facility] MyChart), or I can request a printed copy by calling the office of [facility].    I understand that my insurance will be billed for this visit.   I have read or had this consent read to me. I understand the contents of this consent, which adequately explains the benefits and risks of the Services being provided via telemedicine.  I have been provided ample opportunity to ask questions regarding this consent and the Services and have had my questions answered to my satisfaction. I give my informed consent for the services to be provided through the use of telemedicine in my medical care

## 2023-04-14 ENCOUNTER — Telehealth: Payer: Self-pay | Admitting: Cardiovascular Disease

## 2023-04-14 ENCOUNTER — Telehealth: Payer: Self-pay | Admitting: *Deleted

## 2023-04-14 NOTE — Telephone Encounter (Signed)
Returned call to Melanee Spry, he is going to send the clearance over with the correct information on it.

## 2023-04-14 NOTE — Telephone Encounter (Signed)
Pt has been scheduled a tele visit, 9/26 1:40.  Consent on file / medications reconciled.

## 2023-04-14 NOTE — Telephone Encounter (Signed)
Pt has been scheduled a tele visit, 9/26 1:40.  Consent on file / medications reconciled.     Patient Consent for Virtual Visit        [name] has provided verbal consent on 04/14/2023 for a virtual visit (video or telephone).   CONSENT FOR VIRTUAL VISIT FOR:  [name]  By participating in this virtual visit I agree to the following:  I hereby voluntarily request, consent and authorize [facility] and its employed or contracted physicians, physician assistants, nurse practitioners or other licensed health care professionals (the Practitioner), to provide me with telemedicine health care services (the "Services") as deemed necessary by the treating Practitioner. I acknowledge and consent to receive the Services by the Practitioner via telemedicine. I understand that the telemedicine visit will involve communicating with the Practitioner through live audiovisual communication technology and the disclosure of certain medical information by electronic transmission. I acknowledge that I have been given the opportunity to request an in-person assessment or other available alternative prior to the telemedicine visit and am voluntarily participating in the telemedicine visit.  I understand that I have the right to withhold or withdraw my consent to the use of telemedicine in the course of my care at any time, without affecting my right to future care or treatment, and that the Practitioner or I may terminate the telemedicine visit at any time. I understand that I have the right to inspect all information obtained and/or recorded in the course of the telemedicine visit and may receive copies of available information for a reasonable fee.  I understand that some of the potential risks of receiving the Services via telemedicine include:  Delay or interruption in medical evaluation due to technological equipment failure or disruption; Information transmitted may not be  sufficient (e.g. poor resolution of images) to allow for appropriate medical decision making by the Practitioner; and/or  In rare instances, security protocols could fail, causing a breach of personal health information.  Furthermore, I acknowledge that it is my responsibility to provide information about my medical history, conditions and care that is complete and accurate to the best of my ability. I acknowledge that Practitioner's advice, recommendations, and/or decision may be based on factors not within their control, such as incomplete or inaccurate data provided by me or distortions of diagnostic images or specimens that may result from electronic transmissions. I understand that the practice of medicine is not an exact science and that Practitioner makes no warranties or guarantees regarding treatment outcomes. I acknowledge that a copy of this consent can be made available to me via my patient portal [facility] MyChart), or I can request a printed copy by calling the office of [facility].    I understand that my insurance will be billed for this visit.   I have read or had this consent read to me. I understand the contents of this consent, which adequately explains the benefits and risks of the Services being provided via telemedicine.  I have been provided ample opportunity to ask questions regarding this consent and the Services and have had my questions answered to my satisfaction. I give my informed consent for the services to be provided through the use of telemedicine in my medical care

## 2023-04-14 NOTE — Telephone Encounter (Signed)
Received the clearance, however, nothing was on it except pt's information and surgeon information.    Placed call to surgeon's office, they are going to fax over the corrected one with what procedure is, anesthesia, etc.

## 2023-04-14 NOTE — Telephone Encounter (Signed)
[name] from Dr. Pryor Ochoa is returning phone call. [name] requested when calling to ask for him.

## 2023-04-14 NOTE — Telephone Encounter (Signed)
Patient has dropped off a medical clearance form to be processed.  I will get this faxed to [employer] next.  Thank you

## 2023-04-14 NOTE — Telephone Encounter (Signed)
   Name: [name]  DOB: 1955-10-08  MRN: [id number]  Primary Cardiologist: Nanetta Batty, MD   Preoperative team, please contact this patient and set up a phone call appointment for further preoperative risk assessment. Please obtain consent and complete medication review. Thank you for your help.  I confirm that guidance regarding antiplatelet and oral anticoagulation therapy has been completed and, if necessary, noted below.  Per office protocol, if patient is without any new symptoms or concerns at the time of their virtual visit,she may hold ASA for 7 days and Plavix for 5 days prior to procedure. Please resume ASA and Plavix  as soon as possible postprocedure, at the discretion of the Jarrett Soho, NP 04/14/2023, 4:06 PM [facility]

## 2023-04-14 NOTE — Telephone Encounter (Signed)
   Pre-operative Risk Assessment    Patient Name: [name]  DOB: 08/30/1955 MRN: [id number]      Request for Surgical Clearance    Procedure:   FULL FACE CO2 RESURFACING  Date of Surgery:  Clearance 05/10/23                                 Surgeon:   Surgeon's Group or Practice Name:  [facility]LAST[facility] Phone number:  [phone] Fax number:  [phone]   Type of Clearance Requested:   - Medical  - Pharmacy:  Hold Aspirin NOT INDICATED HOW LONG   Type of Anesthesia:  ORAL SEDATION  LOCAL ANESTHESIA   Additional requests/questions:    Wilhemina Cash   04/14/2023, 3:06 PM

## 2023-04-19 ENCOUNTER — Encounter: Payer: Self-pay | Admitting: Family Medicine

## 2023-04-19 MED ORDER — WEGOVY 1 MG/0.5ML ~~LOC~~ SOAJ: 1.0000 mg | SUBCUTANEOUS | 0 refills | Status: DC

## 2023-04-28 ENCOUNTER — Ambulatory Visit: Payer: Medicare Other | Attending: Cardiology

## 2023-04-28 DIAGNOSIS — Z01810 Encounter for preprocedural cardiovascular examination: Secondary | ICD-10-CM | POA: Diagnosis not present

## 2023-04-28 NOTE — Progress Notes (Signed)
Virtual Visit via Telephone Note   Because of [name]'s co-morbid illnesses, she is at least at moderate risk for complications without adequate follow up.  This format is felt to be most appropriate for this patient at this time.  The patient did not have access to video technology/had technical difficulties with video requiring transitioning to audio format only (telephone).  All issues noted in this document were discussed and addressed.  No physical exam could be performed with this format.  Please refer to the patient's chart for her consent to telehealth for [facility].  Evaluation Performed:  Preoperative cardiovascular risk assessment _____________   Date:  04/28/2023   Patient ID:  [name], DOB 06-01-1956, MRN [id number] Patient Location:  Home Provider location:   Office  Primary Care Provider:  Pearline Cables, MD Primary Cardiologist:  Nanetta Batty, MD  Chief Complaint / Patient Profile   67 y.o. y/o female with a h/o HTN, HLD with statin intolerance, obesity, hypothyroidism and CAD who is pending full face CO2 resurfacing procedure and presents today for telephonic preoperative cardiovascular risk assessment.  History of Present Illness    [name] is a 67 y.o. female who presents via audio/video conferencing for a telehealth visit today.  Pt was last seen in cardiology clinic on 01/17/2023 by Gillian Shields, NP.  At that time [name] was doing well.  The patient is now pending procedure as outlined above. Since her last visit, she has been doing well without any exertional chest pain or shortness of breath  Past Medical History    Past Medical History:  Diagnosis Date   Angina effort 05/22/2013   Anxiety    CAD (coronary artery disease), possible SCAD 12/14/2012   Colon polyps    Duodenitis    Dyspnea on exertion    LEXISCAN, 09/30/2008 - normal, EKG negative for ischemia, no ECG changes   Esophageal  stricture    Gastritis    GERD (gastroesophageal reflux disease)    Gout    "only from RX given after MI" (01/22/2013)   H/O hiatal hernia    History of esophageal stricture    Hypertension    2D ECHO, 09/30/2008 - EF >55%, normal: RENAL DOPPLER, 03/21/2007 - normal duplex study   Hypothyroidism    IBS (irritable bowel syndrome)    Ischemic colitis (HCC)    Lower GI bleeding    10/07/11 "I've had 3 episodes in the past year"   Myocardial infarction (HCC) 12/10/2012   "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)   Tubular adenoma polyp of rectum 2008   Past Surgical History:  Procedure Laterality Date   ABDOMINAL HYSTERECTOMY  1999   ANTERIOR LUMBAR FUSION  2010   "L4-5" (01/22/2013)   CARDIAC CATHETERIZATION N/A 02/06/2015   Procedure: Left Heart Cath and Coronary Angiography;  Surgeon: Marykay Lex, MD;  Location: [facility] INVASIVE CV LAB;  Service: Cardiovascular;  Laterality: N/A;   ESOPHAGEAL DILATION  08/2010   ESOPHAGEAL DILATION     "once or twice" (01/22/2013)   HAMMER TOE SURGERY Bilateral ~ 2002   LEFT HEART CATHETERIZATION WITH CORONARY ANGIOGRAM N/A 12/11/2012   Procedure: LEFT HEART CATHETERIZATION WITH CORONARY ANGIOGRAM;  Surgeon: Marykay Lex, MD;  Location: [facility] CATH LAB;  Service: Cardiovascular;  Laterality: N/A;   LEFT HEART CATHETERIZATION WITH CORONARY ANGIOGRAM N/A 12/15/2012   Procedure: LEFT HEART CATHETERIZATION WITH CORONARY ANGIOGRAM;  Surgeon: Marykay Lex, MD;  Location: [facility] CATH LAB;  Service:  Cardiovascular;  Laterality: N/A;   PERIPHERAL VENOUS STUDY  10/03/2008   No evidence of DVT, superficial thrombosis, or Baker's cyst   TONSILLECTOMY AND ADENOIDECTOMY  1960's   TUBAL LIGATION  1985    Allergies  No Known Allergies  Home Medications    Prior to Admission medications   Medication Sig Start Date End Date Taking? Authorizing Provider  allopurinol (ZYLOPRIM) 100 MG tablet Take 3 tablets (300 mg total) by mouth daily. Patient taking  differently: Take 300 mg by mouth daily as needed (GOUT). 06/07/22   Copland, Gwenlyn Found, MD  ALPRAZolam Prudy Feeler) 0.5 MG tablet TAKE ONE TABLET BY MOUTH THREE TIMES DAILY AS NEEDED FOR ANXIETY 12/21/22   Copland, Gwenlyn Found, MD  amLODipine (NORVASC) 5 MG tablet TAKE ONE TABLET BY MOUTH ONE TIME DAILY 03/01/23   [name], MD  aspirin EC 81 MG tablet Take 81 mg by mouth daily. Swallow whole.    [provider]  clobetasol (TEMOVATE) 0.05 % external solution Apply topically. 07/07/21   [provider]  clopidogrel (PLAVIX) 75 MG tablet take 1 tablet by mouth once a day with breakfast 08/06/22   [name], MD  dicyclomine (BENTYL) 10 MG capsule Take 2 capsules (20 mg total) by mouth 4 (four) times daily -  before meals and at bedtime. Patient taking differently: Take 20 mg by mouth as needed for spasms. 04/20/21   Azucena Fallen, MD  Evolocumab (REPATHA SURECLICK) 140 MG/ML SOAJ Inject 140 MG into the skin once every 14 days 12/21/22   Chrystie Nose, MD  ezetimibe (ZETIA) 10 MG tablet TAKE ONE TABLET BY MOUTH ONE TIME DAILY 02/07/23   Hilty, Lisette Abu, MD  furosemide (LASIX) 20 MG tablet Take 1 tablet (20 mg total) by mouth daily. 12/18/21 04/14/23  [name], MD  lisinopril (ZESTRIL) 5 MG tablet TAKE ONE TABLET BY MOUTH ONE TIME DAILY 01/10/23   [name], MD  metoprolol tartrate (LOPRESSOR) 25 MG tablet Take 1 tablet (25 mg total) by mouth 2 (two) times daily. 10/04/22   Azalee Course, PA  nitroGLYCERIN (NITROSTAT) 0.4 MG SL tablet DISSOLVE 1 TABLET UNDER THE TONGUE EVERY 5 MINUTES AS NEEDED FOR CHEST PAIN 10/07/22   [name], MD  ondansetron (ZOFRAN) 4 MG tablet Take 1 tablet (4 mg total) by mouth every 8 (eight) hours as needed for nausea or vomiting. 02/28/23   Copland, Gwenlyn Found, MD  pantoprazole (PROTONIX) 40 MG tablet TAKE 1 TABLET BY MOUTH ONCE DAILY 04/07/23   Alver Sorrow, NP  pimecrolimus (ELIDEL) 1 % cream  07/07/21   [provider]   predniSONE (DELTASONE) 20 MG tablet Take one by mouth daily for 3-5 days as needed for gout flare 06/04/22   Copland, Gwenlyn Found, MD  ranolazine (RANEXA) 1000 MG SR tablet TAKE ONE TABLET BY MOUTH TWICE DAILY Patient taking differently: Take 1,000 mg by mouth daily. 01/14/23   Hilty, Lisette Abu, MD  Semaglutide-Weight Management (WEGOVY) 1 MG/0.5ML SOAJ Inject 1 mg into the skin once a week. 04/19/23   Copland, Gwenlyn Found, MD  tretinoin (RETIN-A) 0.1 % cream Apply 1 Application topically at bedtime. 01/19/23   [provider]    Physical Exam    Vital Signs:  [name] does not have vital signs available for review today.  Given telephonic nature of communication, physical exam is limited. AAOx3. NAD. Normal affect.  Speech and respirations are unlabored.  Accessory Clinical Findings  None  Assessment & Plan    1.  Preoperative Cardiovascular Risk Assessment:  -Patient presents today for virtual visit.  She denies any recent chest pain or shortness of breath.  She will require upcoming full face CO2 resurfacing procedure done under local anesthesia.  She is at acceptable risk to proceed with this low risk procedure.  She may hold aspirin and Plavix for 7 days prior to the procedure and restart as soon as possible afterward at the surgeon's discretion.  The patient was advised that if she develops new symptoms prior to surgery to contact our office to arrange for a follow-up visit, and she verbalized understanding.   A copy of this note will be routed to requesting surgeon.  Time:   Today, I have spent 4 minutes with the patient with telehealth technology discussing medical history, symptoms, and management plan.     [place], [place]  04/28/2023, 1:06 PM

## 2023-04-29 NOTE — Patient Instructions (Incomplete)
Good to see you today Recommend covid booster, shingles vaccine series, pneumonia shot at your pharmacy  I will be in touch with your labs Let me know if the side effects from [facility] are too much to put up with!  Otherwise, we can increase to 2 mg when you feel ready

## 2023-04-29 NOTE — Progress Notes (Unsigned)
[facility] [address] [place], [place] [address] [phone] [id number]  Date:  05/02/2023   Name:  [name]   DOB:  07-Sep-1955   MRN:  [id number]  PCP:  Pearline Cables, MD    Chief Complaint: wegovy (Pt would also like to have her flu shot and labs for kidney function)   History of Present Illness:  [name] is a 67 y.o. very pleasant female patient who presents with the following:  Pt seen today to discuss wegovy Last seen by myself in July  at which time she was concerned about fatigue and not able to lose weight History of HTN, CAD/NSTEMI, obesity, hyperlipidemia, IBS, hypothyroidism, GI bleed 2022, chronic moderate renal insufficiency  She is under nephrology care-most recent visit with [facility] was in August  I started her on Wegovy in July - increased her dose to 1mg  earlier this month. Pt notes she tends to have a lot of nausea, has had to change her eating habits to include smaller more frequent meals She had just 1 shot of the 1mg  so far  She does want to stick with it as she really wants to lose weight  She is going to have a laser resurfacing for her face   Colon cancer screening- she is going for scope in November  Shingrix- recommended  Covid booster- recommended  Pneumonia vaccine due- pt declines  Give flu shot today  Wt Readings from Last 3 Encounters:  05/02/23 214 lb 3.2 oz (97.2 kg)  04/07/23 215 lb 6 oz (97.7 kg)  02/02/23 220 lb 6.4 oz (100 kg)      Patient Active Problem List   Diagnosis Date Noted   Family history of colon cancer 04/07/2023   Antiplatelet or antithrombotic long-term use 04/07/2023   Esophageal stenosis 04/07/2023   Acute GI bleeding 04/17/2021   Colitis 04/17/2021   Lower abdominal pain 01/28/2021   Dyspnea on exertion 10/24/2019   Pre-diabetes 01/14/2017   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   Tachycardia, somewhat irregular,  episodic 10/08/2013   Dizziness 10/08/2013   Angina effort 05/22/2013   Unstable angina (HCC) 01/22/2013   Hyperlipidemia 01/01/2013   CAD (coronary artery disease), possible SCAD 12/14/2012   NSTEMI (non-ST elevated myocardial infarction) (HCC) 12/10/2012   Essential hypertension 12/10/2012   Hypothyroidism 12/10/2012   Obesity (BMI 35.0-39.9 without comorbidity) 12/10/2012   Gastroenteritis 10/08/2011   Acute ischemic colitis (HCC) 08/09/2011   Dysphagia 07/01/2010   RECTAL BLEEDING Jan 2013 11/04/2009   GERD 02/11/2009   Irritable bowel syndrome 02/11/2009   Generalized abdominal pain 02/11/2009   History of colonic polyps 02/11/2009    Past Medical History:  Diagnosis Date   Angina effort 05/22/2013   Anxiety    CAD (coronary artery disease), possible SCAD 12/14/2012   Colon polyps    Duodenitis    Dyspnea on exertion    LEXISCAN, 09/30/2008 - normal, EKG negative for ischemia, no ECG changes   Esophageal stricture    Gastritis    GERD (gastroesophageal reflux disease)    Gout    "only from RX given after MI" (01/22/2013)   H/O hiatal hernia    History of esophageal stricture    Hypertension    2D ECHO, 09/30/2008 - EF >55%, normal: RENAL DOPPLER, 03/21/2007 - normal duplex study   Hypothyroidism    IBS (irritable bowel syndrome)    Ischemic colitis (HCC)    Lower  GI bleeding    10/07/11 "I've had 3 episodes in the past year"   Myocardial infarction [facility]) 12/10/2012   "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)   Tubular adenoma polyp of rectum 2008    Past Surgical History:  Procedure Laterality Date   ABDOMINAL HYSTERECTOMY  1999   ANTERIOR LUMBAR FUSION  2010   "L4-5" (01/22/2013)   CARDIAC CATHETERIZATION N/A 02/06/2015   Procedure: Left Heart Cath and Coronary Angiography;  Surgeon: Marykay Lex, MD;  Location: [facility] INVASIVE CV LAB;  Service: Cardiovascular;  Laterality: N/A;   ESOPHAGEAL DILATION  08/2010   ESOPHAGEAL DILATION     "once or twice"  (01/22/2013)   HAMMER TOE SURGERY Bilateral ~ 2002   LEFT HEART CATHETERIZATION WITH CORONARY ANGIOGRAM N/A 12/11/2012   Procedure: LEFT HEART CATHETERIZATION WITH CORONARY ANGIOGRAM;  Surgeon: Marykay Lex, MD;  Location: [facility] CATH LAB;  Service: Cardiovascular;  Laterality: N/A;   LEFT HEART CATHETERIZATION WITH CORONARY ANGIOGRAM N/A 12/15/2012   Procedure: LEFT HEART CATHETERIZATION WITH CORONARY ANGIOGRAM;  Surgeon: Marykay Lex, MD;  Location: [facility] CATH LAB;  Service: Cardiovascular;  Laterality: N/A;   PERIPHERAL VENOUS STUDY  10/03/2008   No evidence of DVT, superficial thrombosis, or Baker's cyst   TONSILLECTOMY AND ADENOIDECTOMY  1960's   TUBAL LIGATION  1985    Social History   Tobacco Use   Smoking status: Never   Smokeless tobacco: Never  Vaping Use   Vaping status: Never Used  Substance Use Topics   Alcohol use: No   Drug use: No    Family History  Problem Relation Age of Onset   Colon cancer Brother 42   Kidney disease Mother    Stroke Father    Multiple myeloma Sister    Heart attack Brother    Other Brother    Other Brother     No Known Allergies  Medication list has been reviewed and updated.  Current Outpatient Medications on File Prior to Visit  Medication Sig Dispense Refill   allopurinol (ZYLOPRIM) 100 MG tablet Take 3 tablets (300 mg total) by mouth daily. (Patient taking differently: Take 300 mg by mouth daily as needed (GOUT).) 270 tablet 3   ALPRAZolam (XANAX) 0.5 MG tablet TAKE ONE TABLET BY MOUTH THREE TIMES DAILY AS NEEDED FOR ANXIETY 90 tablet 1   amLODipine (NORVASC) 5 MG tablet TAKE ONE TABLET BY MOUTH ONE TIME DAILY 90 tablet 0   aspirin EC 81 MG tablet Take 81 mg by mouth daily. Swallow whole.     clobetasol (TEMOVATE) 0.05 % external solution Apply topically.     dicyclomine (BENTYL) 10 MG capsule Take 2 capsules (20 mg total) by mouth 4 (four) times daily -  before meals and at bedtime. (Patient taking differently: Take 20 mg by mouth  as needed for spasms.) 240 capsule 0   Evolocumab (REPATHA SURECLICK) 140 MG/ML SOAJ Inject 140 MG into the skin once every 14 days 2 mL 11   ezetimibe (ZETIA) 10 MG tablet TAKE ONE TABLET BY MOUTH ONE TIME DAILY 90 tablet 0   lisinopril (ZESTRIL) 5 MG tablet TAKE ONE TABLET BY MOUTH ONE TIME DAILY 90 tablet 3   metoprolol tartrate (LOPRESSOR) 25 MG tablet Take 1 tablet (25 mg total) by mouth 2 (two) times daily. 180 tablet 3   nitroGLYCERIN (NITROSTAT) 0.4 MG SL tablet DISSOLVE 1 TABLET UNDER THE TONGUE EVERY 5 MINUTES AS NEEDED FOR CHEST PAIN 25 tablet 3   ondansetron (ZOFRAN) 4 MG tablet Take  1 tablet (4 mg total) by mouth every 8 (eight) hours as needed for nausea or vomiting. 20 tablet 0   pantoprazole (PROTONIX) 40 MG tablet TAKE 1 TABLET BY MOUTH ONCE DAILY 90 tablet 0   pimecrolimus (ELIDEL) 1 % cream      predniSONE (DELTASONE) 20 MG tablet Take one by mouth daily for 3-5 days as needed for gout flare 30 tablet 1   ranolazine (RANEXA) 1000 MG SR tablet TAKE ONE TABLET BY MOUTH TWICE DAILY (Patient taking differently: Take 1,000 mg by mouth daily.) 180 tablet 0   Semaglutide-Weight Management (WEGOVY) 1 MG/0.5ML SOAJ Inject 1 mg into the skin once a week. 2 mL 0   tretinoin (RETIN-A) 0.1 % cream Apply 1 Application topically at bedtime.     furosemide (LASIX) 20 MG tablet Take 1 tablet (20 mg total) by mouth daily. 90 tablet 3   No current facility-administered medications on file prior to visit.    Review of Systems:  As per HPI- otherwise negative.   Physical Examination: Vitals:   05/02/23 1416  BP: 112/72  Pulse: 78  Resp: 18  Temp: 97.8 F (36.6 C)  SpO2: 94%   Vitals:   05/02/23 1416  Weight: 214 lb 3.2 oz (97.2 kg)  Height: 5\' 3"  (1.6 m)   Body mass index is 37.94 kg/m. Ideal Body Weight: Weight in (lb) to have BMI = 25: 140.8  GEN: no acute distress.  Obese looks well  HEENT: Atraumatic, Normocephalic.  Bilateral TM wnl, oropharynx normal.  PEERL,EOMI.     Ears and Nose: No external deformity. CV: RRR, No M/G/R. No JVD. No thrill. No extra heart sounds. PULM: CTA B, no wheezes, crackles, rhonchi. No retractions. No resp. distress. No accessory muscle use. ABD: S, NT, ND, +BS. No rebound. No HSM. EXTR: No c/c/e PSYCH: Normally interactive. Conversant.    Assessment and Plan: Immunization due - Plan: Flu Vaccine Trivalent High Dose (Fluad)  Nausea and vomiting, unspecified vomiting type - Plan: promethazine (PHENERGAN) 25 MG tablet  Essential hypertension  Mild anemia - Plan: CBC  Chronic idiopathic gout involving toe of left foot without tophus - Plan: Basic metabolic panel  Gave her some phenergan to have on hand for use after her laser facial resurfacing- she notes she is not supposed to touch her face after the procedure and that zofran makes her too constipated.  Advised phenergan may cause sedation  BP under good control She is losing some weight- just went up to the 1mg  ozempic, encouraged her progress will likely be faster now We can go to 2 mg when she is ready   Signed Abbe Amsterdam, MD Addendum 10/1 Received labs as below, message to patient Results for orders placed or performed in visit on 05/02/23  Basic metabolic panel  Result Value Ref Range   Sodium 135 135 - 145 mEq/L   Potassium 4.1 3.5 - 5.1 mEq/L   Chloride 99 96 - 112 mEq/L   CO2 27 19 - 32 mEq/L   Glucose, Bld 110 (H) 70 - 99 mg/dL   BUN 21 6 - 23 mg/dL   Creatinine, Ser 1.61 (H) 0.40 - 1.20 mg/dL   GFR 09.60 (L) >45.40 mL/min   Calcium 9.5 8.4 - 10.5 mg/dL  CBC  Result Value Ref Range   WBC 7.7 4.0 - 10.5 K/uL   RBC 4.19 3.87 - 5.11 Mil/uL   Platelets 306.0 150.0 - 400.0 K/uL   Hemoglobin 12.7 12.0 - 15.0 g/dL   HCT  39.0 36.0 - 46.0 %   MCV 93.2 78.0 - 100.0 fl   MCHC 32.6 30.0 - 36.0 g/dL   RDW 41.6 60.6 - 30.1 %

## 2023-05-02 ENCOUNTER — Other Ambulatory Visit: Payer: Self-pay | Admitting: Cardiovascular Disease

## 2023-05-02 ENCOUNTER — Other Ambulatory Visit (HOSPITAL_BASED_OUTPATIENT_CLINIC_OR_DEPARTMENT_OTHER): Payer: Self-pay | Admitting: Internal Medicine

## 2023-05-02 ENCOUNTER — Ambulatory Visit (INDEPENDENT_AMBULATORY_CARE_PROVIDER_SITE_OTHER): Payer: Medicare Other | Admitting: Family Medicine

## 2023-05-02 VITALS — BP 112/72 | HR 78 | Temp 97.8°F | Resp 18 | Ht 63.0 in | Wt 214.2 lb

## 2023-05-02 DIAGNOSIS — Z23 Encounter for immunization: Secondary | ICD-10-CM

## 2023-05-02 DIAGNOSIS — R112 Nausea with vomiting, unspecified: Secondary | ICD-10-CM

## 2023-05-02 DIAGNOSIS — D649 Anemia, unspecified: Secondary | ICD-10-CM | POA: Diagnosis not present

## 2023-05-02 DIAGNOSIS — M1A0720 Idiopathic chronic gout, left ankle and foot, without tophus (tophi): Secondary | ICD-10-CM | POA: Diagnosis not present

## 2023-05-02 DIAGNOSIS — Z6839 Body mass index (BMI) 39.0-39.9, adult: Secondary | ICD-10-CM

## 2023-05-02 DIAGNOSIS — I10 Essential (primary) hypertension: Secondary | ICD-10-CM | POA: Diagnosis not present

## 2023-05-02 MED ORDER — PROMETHAZINE HCL 25 MG PO TABS
25.0000 mg | ORAL_TABLET | Freq: Three times a day (TID) | ORAL | 0 refills | Status: DC | PRN
Start: 2023-05-02 — End: 2023-11-08

## 2023-05-03 ENCOUNTER — Encounter: Payer: Self-pay | Admitting: Family Medicine

## 2023-05-03 LAB — CBC
HCT: 39 % (ref 36.0–46.0)
Hemoglobin: 12.7 g/dL (ref 12.0–15.0)
MCHC: 32.6 g/dL (ref 30.0–36.0)
MCV: 93.2 fL (ref 78.0–100.0)
Platelets: 306 10*3/uL (ref 150.0–400.0)
RBC: 4.19 Mil/uL (ref 3.87–5.11)
RDW: 13.1 % (ref 11.5–15.5)
WBC: 7.7 10*3/uL (ref 4.0–10.5)

## 2023-05-03 LAB — BASIC METABOLIC PANEL
BUN: 21 mg/dL (ref 6–23)
CO2: 27 meq/L (ref 19–32)
Calcium: 9.5 mg/dL (ref 8.4–10.5)
Chloride: 99 meq/L (ref 96–112)
Creatinine, Ser: 1.42 mg/dL — ABNORMAL HIGH (ref 0.40–1.20)
GFR: 38.38 mL/min — ABNORMAL LOW (ref >60.00)
Glucose, Bld: 110 mg/dL — ABNORMAL HIGH (ref 70–99)
Potassium: 4.1 meq/L (ref 3.5–5.1)
Sodium: 135 meq/L (ref 135–145)

## 2023-05-23 ENCOUNTER — Ambulatory Visit: Payer: Medicare Other | Attending: Cardiology

## 2023-05-23 ENCOUNTER — Other Ambulatory Visit: Payer: Self-pay | Admitting: Family Medicine

## 2023-05-23 ENCOUNTER — Encounter: Payer: Self-pay | Admitting: Family Medicine

## 2023-05-23 DIAGNOSIS — Z01810 Encounter for preprocedural cardiovascular examination: Secondary | ICD-10-CM | POA: Diagnosis not present

## 2023-05-23 MED ORDER — WEGOVY 1.7 MG/0.75ML ~~LOC~~ SOAJ: 1.7000 mg | SUBCUTANEOUS | 0 refills | Status: DC

## 2023-05-23 MED ORDER — WEGOVY 1 MG/0.5ML ~~LOC~~ SOAJ: 1.0000 mg | SUBCUTANEOUS | 2 refills | Status: DC

## 2023-05-23 NOTE — Progress Notes (Signed)
Virtual Visit via Telephone Note   Because of [name]'s co-morbid illnesses, she is at least at moderate risk for complications without adequate follow up.  This format is felt to be most appropriate for this patient at this time.  The patient did not have access to video technology/had technical difficulties with video requiring transitioning to audio format only (telephone).  All issues noted in this document were discussed and addressed.  No physical exam could be performed with this format.  Please refer to the patient's chart for her consent to telehealth for [facility].  Evaluation Performed:  Preoperative cardiovascular risk assessment _____________   Date:  05/23/2023   Patient ID:  [name], DOB Jun 30, 1956, MRN [id number] Patient Location:  Home Provider location:   Office  Primary Care Provider:  Pearline Cables, MD Primary Cardiologist:  Nanetta Batty, MD  Chief Complaint / Patient Profile   67 y.o. y/o female with a h/o coronary artery disease, hyperlipidemia, hypertension who is pending EGD/colonoscopy and presents today for telephonic preoperative cardiovascular risk assessment.  History of Present Illness    [name] is a 67 y.o. female who presents via audio/video conferencing for a telehealth visit today.  Pt was last seen in cardiology clinic on 01/17/2023 by Gillian Shields, NP.  At that time [name] was evaluated for an episode of diaphoresis, nauseousness, and fatigue.  Her nuclear stress test was low risk and showed no ischemia.  The patient is now pending procedure as outlined above. Since her last visit, she needs to be stable from a cardiac standpoint.  Today she denies chest pain, shortness of breath, lower extremity edema, fatigue, palpitations, melena, hematuria, hemoptysis, diaphoresis, weakness, presyncope, syncope, orthopnea, and PND.   Past Medical History    Past Medical History:  Diagnosis  Date   Angina effort 05/22/2013   Anxiety    CAD (coronary artery disease), possible SCAD 12/14/2012   Colon polyps    Duodenitis    Dyspnea on exertion    LEXISCAN, 09/30/2008 - normal, EKG negative for ischemia, no ECG changes   Esophageal stricture    Gastritis    GERD (gastroesophageal reflux disease)    Gout    "only from RX given after MI" (01/22/2013)   H/O hiatal hernia    History of esophageal stricture    Hypertension    2D ECHO, 09/30/2008 - EF >55%, normal: RENAL DOPPLER, 03/21/2007 - normal duplex study   Hypothyroidism    IBS (irritable bowel syndrome)    Ischemic colitis (HCC)    Lower GI bleeding    10/07/11 "I've had 3 episodes in the past year"   Myocardial infarction (HCC) 12/10/2012   "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)   Tubular adenoma polyp of rectum 2008   Past Surgical History:  Procedure Laterality Date   ABDOMINAL HYSTERECTOMY  1999   ANTERIOR LUMBAR FUSION  2010   "L4-5" (01/22/2013)   CARDIAC CATHETERIZATION N/A 02/06/2015   Procedure: Left Heart Cath and Coronary Angiography;  Surgeon: Marykay Lex, MD;  Location[facility] INVASIVE CV LAB;  Service: Cardiovascular;  Laterality: N/A;   ESOPHAGEAL DILATION  08/2010   ESOPHAGEAL DILATION     "once or twice" (01/22/2013)   HAMMER TOE SURGERY Bilateral ~ 2002   LEFT HEART CATHETERIZATION WITH CORONARY ANGIOGRAM N/A 12/11/2012   Procedure: LEFT HEART CATHETERIZATION WITH CORONARY ANGIOGRAM;  Surgeon: Marykay Lex, MD;  Location: [facility] CATH LAB;  Service: Cardiovascular;  Laterality: N/A;  LEFT HEART CATHETERIZATION WITH CORONARY ANGIOGRAM N/A 12/15/2012   Procedure: LEFT HEART CATHETERIZATION WITH CORONARY ANGIOGRAM;  Surgeon: Marykay Lex, MD;  Location: [facility] CATH LAB;  Service: Cardiovascular;  Laterality: N/A;   PERIPHERAL VENOUS STUDY  10/03/2008   No evidence of DVT, superficial thrombosis, or Baker's cyst   TONSILLECTOMY AND ADENOIDECTOMY  1960's   TUBAL LIGATION  1985     Allergies  No Known Allergies  Home Medications    Prior to Admission medications   Medication Sig Start Date End Date Taking? Authorizing Provider  allopurinol (ZYLOPRIM) 100 MG tablet Take 3 tablets (300 mg total) by mouth daily. Patient taking differently: Take 300 mg by mouth daily as needed (GOUT). 06/07/22   Copland, Gwenlyn Found, MD  ALPRAZolam Prudy Feeler) 0.5 MG tablet TAKE ONE TABLET BY MOUTH THREE TIMES DAILY AS NEEDED FOR ANXIETY 12/21/22   Copland, Gwenlyn Found, MD  amLODipine (NORVASC) 5 MG tablet TAKE ONE TABLET BY MOUTH ONE TIME DAILY 03/01/23   [name], MD  aspirin EC 81 MG tablet Take 81 mg by mouth daily. Swallow whole.    [provider]  clobetasol (TEMOVATE) 0.05 % external solution Apply topically. 07/07/21   [provider]  clopidogrel (PLAVIX) 75 MG tablet TAKE ONE TABLET BY MOUTH ONE TIME DAILY WITH BREAKFAST 05/02/23   [name], MD  dicyclomine (BENTYL) 10 MG capsule Take 2 capsules (20 mg total) by mouth 4 (four) times daily -  before meals and at bedtime. Patient taking differently: Take 20 mg by mouth as needed for spasms. 04/20/21   Azucena Fallen, MD  Evolocumab (REPATHA SURECLICK) 140 MG/ML SOAJ Inject 140 MG into the skin once every 14 days 12/21/22   Chrystie Nose, MD  ezetimibe (ZETIA) 10 MG tablet TAKE ONE TABLET BY MOUTH ONCE A DAY 05/04/23   [name], MD  furosemide (LASIX) 20 MG tablet Take 1 tablet (20 mg total) by mouth daily. 12/18/21 04/14/23  [name], MD  lisinopril (ZESTRIL) 5 MG tablet TAKE ONE TABLET BY MOUTH ONE TIME DAILY 01/10/23   [name], MD  metoprolol tartrate (LOPRESSOR) 25 MG tablet Take 1 tablet (25 mg total) by mouth 2 (two) times daily. 10/04/22   Azalee Course, PA  nitroGLYCERIN (NITROSTAT) 0.4 MG SL tablet DISSOLVE 1 TABLET UNDER THE TONGUE EVERY 5 MINUTES AS NEEDED FOR CHEST PAIN 10/07/22   [name], MD  ondansetron (ZOFRAN) 4 MG tablet Take 1 tablet (4 mg total) by mouth  every 8 (eight) hours as needed for nausea or vomiting. 02/28/23   Copland, Gwenlyn Found, MD  pantoprazole (PROTONIX) 40 MG tablet TAKE 1 TABLET BY MOUTH ONCE DAILY 04/07/23   Alver Sorrow, NP  pimecrolimus (ELIDEL) 1 % cream  07/07/21   [provider]  predniSONE (DELTASONE) 20 MG tablet Take one by mouth daily for 3-5 days as needed for gout flare 06/04/22   Copland, Gwenlyn Found, MD  promethazine (PHENERGAN) 25 MG tablet Take 1 tablet (25 mg total) by mouth every 8 (eight) hours as needed for nausea or vomiting. 05/02/23   Copland, Gwenlyn Found, MD  ranolazine (RANEXA) 1000 MG SR tablet TAKE ONE TABLET BY MOUTH TWICE DAILY Patient taking differently: Take 1,000 mg by mouth daily. 01/14/23   Hilty, Lisette Abu, MD  Semaglutide-Weight Management (WEGOVY) 1 MG/0.5ML SOAJ Inject 1 mg into the skin once a week. 04/19/23   Copland, Gwenlyn Found, MD  tretinoin (RETIN-A) 0.1 % cream Apply  1 Application topically at bedtime. 01/19/23   [provider]    Physical Exam    Vital Signs:  [name] does not have vital signs available for review today.  Given telephonic nature of communication, physical exam is limited. AAOx3. NAD. Normal affect.  Speech and respirations are unlabored.  Accessory Clinical Findings    None  Assessment & Plan    1.  Preoperative Cardiovascular Risk Assessment: EGD/colonoscopy, [facility], fax #[phone]    Primary Cardiologist: Nanetta Batty, MD  Chart reviewed as part of pre-operative protocol coverage. Given past medical history and time since last visit, based on ACC/AHA guidelines, [name] would be at acceptable risk for the planned procedure without further cardiovascular testing.   Per office protocol, if patient is without any new symptoms or concerns at the time of their virtual visit,she may hold ASA for 7 days and Plavix for 5 days prior to procedure. Please resume ASA and Plavix as soon as possible postprocedure,  at the discretion of the surgeon   Patient was advised that if he/she develops new symptoms prior to surgery to contact our office to arrange a follow-up appointment.  She verbalized understanding.  I will route this recommendation to the requesting party via Epic fax function and remove from pre-op pool.       Time:   Today, I have spent 5 minutes with the patient with telehealth technology discussing medical history, symptoms, and management plan.  Prior to patient's phone evaluation I spent greater than 10 minutes reviewing their past medical history and cardiac medications.    Ronney Asters, NP  05/23/2023, 7:54 AM

## 2023-05-26 ENCOUNTER — Other Ambulatory Visit: Payer: Self-pay | Admitting: Cardiovascular Disease

## 2023-05-27 NOTE — Telephone Encounter (Signed)
Ozempic is in the fridge labeled.

## 2023-06-06 ENCOUNTER — Ambulatory Visit: Payer: Medicare Other | Admitting: Internal Medicine

## 2023-06-06 ENCOUNTER — Encounter: Payer: Self-pay | Admitting: Internal Medicine

## 2023-06-06 VITALS — BP 129/77 | HR 68 | Temp 97.3°F | Resp 15 | Ht 61.25 in | Wt 215.0 lb

## 2023-06-06 DIAGNOSIS — Z09 Encounter for follow-up examination after completed treatment for conditions other than malignant neoplasm: Secondary | ICD-10-CM

## 2023-06-06 DIAGNOSIS — K219 Gastro-esophageal reflux disease without esophagitis: Secondary | ICD-10-CM

## 2023-06-06 DIAGNOSIS — D128 Benign neoplasm of rectum: Secondary | ICD-10-CM

## 2023-06-06 DIAGNOSIS — Z1211 Encounter for screening for malignant neoplasm of colon: Secondary | ICD-10-CM

## 2023-06-06 DIAGNOSIS — K222 Esophageal obstruction: Secondary | ICD-10-CM | POA: Diagnosis not present

## 2023-06-06 DIAGNOSIS — R1013 Epigastric pain: Secondary | ICD-10-CM

## 2023-06-06 DIAGNOSIS — Z800 Family history of malignant neoplasm of digestive organs: Secondary | ICD-10-CM

## 2023-06-06 DIAGNOSIS — Z860100 Personal history of colon polyps, unspecified: Secondary | ICD-10-CM | POA: Diagnosis not present

## 2023-06-06 DIAGNOSIS — R1310 Dysphagia, unspecified: Secondary | ICD-10-CM

## 2023-06-06 MED ORDER — SODIUM CHLORIDE 0.9 % IV SOLN: 500.0000 mL | Freq: Once | INTRAVENOUS | Status: DC

## 2023-06-06 NOTE — Op Note (Signed)
[facility] Patient Name: [name] Procedure Date: 06/06/2023 7:13 AM MRN: [id number] Endoscopist: Wilhemina Bonito. Marina Goodell , MD, [id number] Age: 67 Referring MD:  Date of Birth: 07-Feb-1956 Gender: Female Account #: [id number] Procedure:                Upper GI endoscopy with [facility] dilation of the                            esophagus?"41 [place] Indications:              Dysphagia, Therapeutic procedure, Esophageal reflux Medicines:                Monitored Anesthesia Care Procedure:                Pre-Anesthesia Assessment:                           - Prior to the procedure, a History and Physical                            was performed, and patient medications and                            allergies were reviewed. The patient's tolerance of                            previous anesthesia was also reviewed. The risks                            and benefits of the procedure and the sedation                            options and risks were discussed with the patient.                            All questions were answered, and informed consent                            was obtained. Prior Anticoagulants: The patient has                            taken Plavix (clopidogrel), last dose was 7 days                            prior to procedure. ASA Grade Assessment: III - A                            patient with severe systemic disease. After                            reviewing the risks and benefits, the patient was                            deemed in satisfactory condition to undergo the  procedure.                           After obtaining informed consent, the endoscope was                            passed under direct vision. Throughout the                            procedure, the patient's blood pressure, pulse, and                            oxygen saturations were monitored continuously. The                            Olympus Scope  [id number] was introduced through the                            mouth, and advanced to the second part of duodenum.                            The upper GI endoscopy was accomplished without                            difficulty. The patient tolerated the procedure                            well. Scope In: Scope Out: Findings:                 One benign-appearing, intrinsic moderate stenosis                            was found 39 cm from the incisors. This stenosis                            measured 1.5 cm (inner diameter). The scope was                            withdrawn. Dilation was performed with a Maloney                            dilator with no resistance at 54 Fr.                           The exam of the esophagus was otherwise normal.                           The stomach was normal. Small hiatal hernia.                           The examined duodenum was normal.                           The cardia and gastric fundus were normal on  retroflexion. Complications:            No immediate complications. Estimated Blood Loss:     Estimated blood loss: none. Impression:               - Benign-appearing esophageal stenosis. Dilated.                           - Normal stomach.                           - Normal examined duodenum.                           - No specimens collected. Recommendation:           - Patient has a contact number available for                            emergencies. The signs and symptoms of potential                            delayed complications were discussed with the                            patient. Return to normal activities tomorrow.                            Written discharge instructions were provided to the                            patient.                           - Post dilation diet.                           - Continue present medications.                           - Resume Plavix today [name] Marina Goodell,  MD 06/06/2023 9:08:02 AM This report has been signed electronically.

## 2023-06-06 NOTE — Progress Notes (Signed)
To pacu, VSS. Report to [email or web address] 

## 2023-06-06 NOTE — Patient Instructions (Addendum)
- Repeat colonoscopy in 5 years for surveillance.                           - Post dilation diet.                           - Continue present medications.                           - Resume Plavix today                           - Continue present medications.                           - Await pathology results. Handouts given to patient.  (Dilation diet, 1 polyp and diverticulosis)  YOU HAD AN ENDOSCOPIC PROCEDURE TODAY AT THE [facility]:   Refer to the procedure report that was given to you for any specific questions about what was found during the examination.  If the procedure report does not answer your questions, please call your gastroenterologist to clarify.  If you requested that your care partner not be given the details of your procedure findings, then the procedure report has been included in a sealed envelope for you to review at your convenience later.  YOU SHOULD EXPECT: Some feelings of bloating in the abdomen. Passage of more gas than usual.  Walking can help get rid of the air that was put into your GI tract during the procedure and reduce the bloating. If you had a lower endoscopy (such as a colonoscopy or flexible sigmoidoscopy) you may notice spotting of blood in your stool or on the toilet paper. If you underwent a bowel prep for your procedure, you may not have a normal bowel movement for a few days.  Please Note:  You might notice some irritation and congestion in your nose or some drainage.  This is from the oxygen used during your procedure.  There is no need for concern and it should clear up in a day or so.  SYMPTOMS TO REPORT IMMEDIATELY:  Following lower endoscopy (colonoscopy or flexible sigmoidoscopy):  Excessive amounts of blood in the stool  Significant tenderness or worsening of abdominal pains  Swelling of the abdomen that is new, acute  Fever of 100F or higher  Following upper endoscopy (EGD)  Vomiting of blood or coffee ground material  New  chest pain or pain under the shoulder blades  Painful or persistently difficult swallowing  New shortness of breath  Fever of 100F or higher  Black, tarry-looking stools  For urgent or emergent issues, a gastroenterologist can be reached at any hour by calling [phone]. Do not use MyChart messaging for urgent concerns.    DIET:  We do recommend a small meal at first, but then you may proceed to your regular diet.  Drink plenty of fluids but you should avoid alcoholic beverages for 24 hours.  ACTIVITY:  You should plan to take it easy for the rest of today and you should NOT DRIVE or use heavy machinery until tomorrow (because of the sedation medicines used during the test).    FOLLOW UP: Our staff will call the number listed on your records the next business day following your procedure.  We will call  around 7:15- 8:00 am to check on you and address any questions or concerns that you may have regarding the information given to you following your procedure. If we do not reach you, we will leave a message.     If any biopsies were taken you will be contacted by phone or by letter within the next 1-3 weeks.  Please call us at [phone] if you have not heard about the biopsies in 3 weeks.    SIGNATURES/CONFIDENTIALITY: You and/or your care partner have signed paperwork which will be entered into your electronic medical record.  These signatures attest to the fact that that the information above on your After Visit Summary has been reviewed and is understood.  Full responsibility of the confidentiality of this discharge information lies with you and/or your care-partner.

## 2023-06-06 NOTE — Progress Notes (Signed)
I have reviewed the patient's medical history in detail and updated the computerized patient record.

## 2023-06-06 NOTE — Progress Notes (Signed)
Called to room to assist during endoscopic procedure.  Patient ID and intended procedure confirmed with present staff. Received instructions for my participation in the procedure from the performing physician.  

## 2023-06-06 NOTE — Op Note (Signed)
[facility] Patient Name: [name] Procedure Date: 06/06/2023 7:28 AM MRN: [id number] Endoscopist: Wilhemina Bonito. Marina Goodell , MD, [id number] Age: 67 Referring MD:  Date of Birth: 03/28/1956 Gender: Female Account #: [id number] Procedure:                Colonoscopy with cold snare polypectomy x 1 Indications:              High risk colon cancer surveillance: Personal                            history of non-advanced adenomas. Previous                            examinations 2003, 2008, 2012, 2019. Brother with a                            history of colon cancer. Medicines:                Monitored Anesthesia Care Procedure:                Pre-Anesthesia Assessment:                           - Prior to the procedure, a History and Physical                            was performed, and patient medications and                            allergies were reviewed. The patient's tolerance of                            previous anesthesia was also reviewed. The risks                            and benefits of the procedure and the sedation                            options and risks were discussed with the patient.                            All questions were answered, and informed consent                            was obtained. Prior Anticoagulants: The patient has                            taken Plavix (clopidogrel), last dose was 7 days                            prior to procedure. ASA Grade Assessment: III - A                            patient with severe systemic disease. After  reviewing the risks and benefits, the patient was                            deemed in satisfactory condition to undergo the                            procedure.                           After obtaining informed consent, the colonoscope                            was passed under direct vision. Throughout the                            procedure, the patient's blood  pressure, pulse, and                            oxygen saturations were monitored continuously. The                            CF [id number] #[id number] was introduced through the anus                            and advanced to the the cecum, identified by                            appendiceal orifice and ileocecal valve. The                            ileocecal valve, appendiceal orifice, and rectum                            were photographed. The quality of the bowel                            preparation was excellent. The colonoscopy was                            performed without difficulty. The patient tolerated                            the procedure well. The bowel preparation used was                            SUPREP via split dose instruction. Scope In: 8:31:10 AM Scope Out: 8:44:32 AM Scope Withdrawal Time: 0 hours 10 minutes 22 seconds  Total Procedure Duration: 0 hours 13 minutes 22 seconds  Findings:                 A 2 mm polyp was found in the rectum. The polyp was                            removed with a cold snare. Resection and retrieval  were complete.                           A few small-mouthed diverticula were found in the                            sigmoid colon.                           The exam was otherwise without abnormality on                            direct and retroflexion views. Complications:            No immediate complications. Estimated blood loss:                            None. Estimated Blood Loss:     Estimated blood loss: none. Impression:               - One 2 mm polyp in the rectum, removed with a cold                            snare. Resected and retrieved.                           - Diverticulosis in the sigmoid colon.                           - The examination was otherwise normal on direct                            and retroflexion views. Recommendation:           - Repeat colonoscopy in 5 years for  surveillance.                           - Patient has a contact number available for                            emergencies. The signs and symptoms of potential                            delayed complications were discussed with the                            patient. Return to normal activities tomorrow.                            Written discharge instructions were provided to the                            patient.                           - Resume previous diet.                           -  Continue present medications.                           - Await pathology results. Wilhemina Bonito. Marina Goodell, MD 06/06/2023 9:03:53 AM This report has been signed electronically.

## 2023-06-06 NOTE — Progress Notes (Signed)
Expand All Collapse All        04/07/2023 [name] [id number] 07-31-1956     HISTORY OF PRESENT ILLNESS: This is a 67 year old female who is a patient of Dr. Lamar Sprinkles.  She has past medical history of coronary artery disease, hypertension, hyperlipidemia, chronic kidney disease stage III, GERD, hiatal hernia/stricture status post previous dilation, history of colon polyps, chronic constipation, history of ischemic colitis.   She is here today to schedule colonoscopy.  Last colonoscopy was August 2019 at which time she had 2 polyps that were 1 to 3 mm in size were consistent with adenomas.  She does have a family history of colon cancer in her brother.   In regards to her constipation she is on Citrucel daily.  Has started [facility] and has noticed maybe slightly worsening constipation since beginning that.   She would also like to have repeat EGD, stating that she has been noticing intermittent issues with solid food getting stuck.   Last EGD August 2019 with a benign-appearing esophageal stenosis that was dilated.   Is on Plavix by Dr. Allyson Sabal.       Past Medical History:  Diagnosis Date   Angina effort 05/22/2013   Anxiety     CAD (coronary artery disease), possible SCAD 12/14/2012   Colon polyps     Duodenitis     Dyspnea on exertion      LEXISCAN, 09/30/2008 - normal, EKG negative for ischemia, no ECG changes   Esophageal stricture     Gastritis     GERD (gastroesophageal reflux disease)     Gout      "only from RX given after MI" (01/22/2013)   H/O hiatal hernia     History of esophageal stricture     Hypertension      2D ECHO, 09/30/2008 - EF >55%, normal: RENAL DOPPLER, 03/21/2007 - normal duplex study   Hypothyroidism     IBS (irritable bowel syndrome)     Ischemic colitis (HCC)     Lower GI bleeding      10/07/11 "I've had 3 episodes in the past year"   Myocardial infarction (HCC) 12/10/2012    "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)   Tubular adenoma  polyp of rectum 2008             Past Surgical History:  Procedure Laterality Date   ABDOMINAL HYSTERECTOMY   1999   ANTERIOR LUMBAR FUSION   2010    "L4-5" (01/22/2013)   CARDIAC CATHETERIZATION N/A 02/06/2015    Procedure: Left Heart Cath and Coronary Angiography;  Surgeon: Marykay Lex, MD;  Location[facility] INVASIVE CV LAB;  Service: Cardiovascular;  Laterality: N/A;   ESOPHAGEAL DILATION   08/2010   ESOPHAGEAL DILATION        "once or twice" (01/22/2013)   HAMMER TOE SURGERY Bilateral ~ 2002   LEFT HEART CATHETERIZATION WITH CORONARY ANGIOGRAM N/A 12/11/2012    Procedure: LEFT HEART CATHETERIZATION WITH CORONARY ANGIOGRAM;  Surgeon: Marykay Lex, MD;  Location: [facility] CATH LAB;  Service: Cardiovascular;  Laterality: N/A;   LEFT HEART CATHETERIZATION WITH CORONARY ANGIOGRAM N/A 12/15/2012    Procedure: LEFT HEART CATHETERIZATION WITH CORONARY ANGIOGRAM;  Surgeon: Marykay Lex, MD;  Location: [facility] CATH LAB;  Service: Cardiovascular;  Laterality: N/A;   PERIPHERAL VENOUS STUDY   10/03/2008    No evidence of DVT, superficial thrombosis, or Baker's cyst   TONSILLECTOMY AND ADENOIDECTOMY   1960's   TUBAL LIGATION   1985  reports that she has never smoked. She has never used smokeless tobacco. She reports that she does not drink alcohol and does not use drugs. family history includes Colon cancer (age of onset: 39) in her brother; Heart attack in her brother; Kidney disease in her mother; Multiple myeloma in her sister; Other in her brother and brother; Stroke in her father. Allergies  No Known Allergies           Outpatient Encounter Medications as of 04/07/2023  Medication Sig   allopurinol (ZYLOPRIM) 100 MG tablet Take 3 tablets (300 mg total) by mouth daily.   ALPRAZolam (XANAX) 0.5 MG tablet TAKE ONE TABLET BY MOUTH THREE TIMES DAILY AS NEEDED FOR ANXIETY   amLODipine (NORVASC) 5 MG tablet TAKE ONE TABLET BY MOUTH ONE TIME DAILY   aspirin EC 81 MG tablet Take 81 mg by  mouth daily. Swallow whole.   clobetasol (TEMOVATE) 0.05 % external solution Apply topically.   clopidogrel (PLAVIX) 75 MG tablet take 1 tablet by mouth once a day with breakfast   dicyclomine (BENTYL) 10 MG capsule Take 2 capsules (20 mg total) by mouth 4 (four) times daily -  before meals and at bedtime.   estradiol (ESTRACE) 0.5 MG tablet Take 0.5 mg by mouth daily.   Evolocumab (REPATHA SURECLICK) 140 MG/ML SOAJ Inject 140 MG into the skin once every 14 days   ezetimibe (ZETIA) 10 MG tablet TAKE ONE TABLET BY MOUTH ONE TIME DAILY   lisinopril (ZESTRIL) 5 MG tablet TAKE ONE TABLET BY MOUTH ONE TIME DAILY   metoprolol tartrate (LOPRESSOR) 25 MG tablet Take 1 tablet (25 mg total) by mouth 2 (two) times daily.   ondansetron (ZOFRAN) 4 MG tablet Take 1 tablet (4 mg total) by mouth every 8 (eight) hours as needed for nausea or vomiting.   pantoprazole (PROTONIX) 40 MG tablet TAKE 1 TABLET BY MOUTH ONCE DAILY   pimecrolimus (ELIDEL) 1 % cream     ranolazine (RANEXA) 1000 MG SR tablet TAKE ONE TABLET BY MOUTH TWICE DAILY   Semaglutide-Weight Management (WEGOVY) 0.5 MG/0.5ML SOAJ Inject 0.5 mg into the skin once a week.   tretinoin (RETIN-A) 0.1 % cream Apply 1 Application topically at bedtime.   furosemide (LASIX) 20 MG tablet Take 1 tablet (20 mg total) by mouth daily.   nitroGLYCERIN (NITROSTAT) 0.4 MG SL tablet DISSOLVE 1 TABLET UNDER THE TONGUE EVERY 5 MINUTES AS NEEDED FOR CHEST PAIN (Patient not taking: Reported on 04/07/2023)   predniSONE (DELTASONE) 20 MG tablet Take one by mouth daily for 3-5 days as needed for gout flare (Patient not taking: Reported on 04/07/2023)   [DISCONTINUED] pantoprazole (PROTONIX) 40 MG tablet TAKE ONE TABLET BY MOUTH ONE TIME DAILY      No facility-administered encounter medications on file as of 04/07/2023.        REVIEW OF SYSTEMS  : All other systems reviewed and negative except where noted in the History of Present Illness.     PHYSICAL EXAM: Ht 5' 1.25"  (1.556 m)   Wt 215 lb 6 oz (97.7 kg)   BMI 40.36 kg/m  General: Well developed white female in no acute distress Head: Normocephalic and atraumatic Eyes:  Sclerae anicteric, conjunctiva pink. Ears: Normal auditory acuity Lungs: Clear throughout to auscultation; no W/R/R. Heart: Regular rate and rhythm; no M/R/G. Rectal:  Will be done at the time of colonoscopy. Musculoskeletal: Symmetrical with no gross deformities  Skin: No lesions on visible extremities Extremities: No edema  Neurological: Alert oriented  x 4, grossly non-focal Psychological:  Alert and cooperative. Normal mood and affect   ASSESSMENT AND PLAN: *Personal history of adenomatous colon polyps and family history of colon cancer in her brother:  Last colonoscopy 03/2018.  Will plan for colonoscopy with Dr. Marina Goodell.   *Constipation: This has been a chronic issue.  Currently on Citrucel but states that she drinks a lot of water.  Suggested that she add a dose of MiraLAX to her daily regimen as well. *Dysphagia:  Has recurrent solid food dysphagia.  Had EGD with dilation in 2019 for esophageal stenosis as well.  With recurrent intermittent symptoms.  She would like to plan for repeat EGD with dilation as well. *Antiplatelet use with Plavix due to history of coronary artery disease:  Hold Plavix for 5 days before procedure - will instruct when and how to resume after procedure. Risks and benefits of procedure including bleeding, perforation, infection, missed lesions, medication reactions and possible hospitalization or surgery if complications occur explained. Additional rare but real risk of cardiovascular event such as heart attack or ischemia/infarct of other organs off of Plavix explained and need to seek urgent help if this occurs. Will communicate by phone or EMR with patient's prescribing provider, Dr. Allyson Sabal, to confirm holding Plavix is reasonable in this case.       CC:  Copland, Gwenlyn Found, MD  Recent H&P as above.  No  interval change in history or physical exam.  Now for colonoscopy and upper endoscopy with esophageal dilation.

## 2023-06-07 ENCOUNTER — Telehealth: Payer: Self-pay | Admitting: *Deleted

## 2023-06-07 NOTE — Telephone Encounter (Signed)
Attempted f/u phone call. No answer. Left message. °

## 2023-06-10 ENCOUNTER — Encounter: Payer: Self-pay | Admitting: Internal Medicine

## 2023-06-10 LAB — SURGICAL PATHOLOGY

## 2023-06-21 ENCOUNTER — Other Ambulatory Visit: Payer: Self-pay | Admitting: Family Medicine

## 2023-06-21 DIAGNOSIS — F411 Generalized anxiety disorder: Secondary | ICD-10-CM

## 2023-07-03 ENCOUNTER — Other Ambulatory Visit: Payer: Self-pay | Admitting: Family

## 2023-07-11 ENCOUNTER — Other Ambulatory Visit: Payer: Self-pay | Admitting: Internal Medicine

## 2023-07-13 ENCOUNTER — Ambulatory Visit: Payer: Medicare Other | Attending: Cardiovascular Disease | Admitting: Cardiovascular Disease

## 2023-07-13 ENCOUNTER — Encounter: Payer: Self-pay | Admitting: Cardiovascular Disease

## 2023-07-13 ENCOUNTER — Ambulatory Visit: Payer: Medicare Other | Admitting: *Deleted

## 2023-07-13 VITALS — BP 112/78 | HR 66 | Ht 62.0 in | Wt 211.0 lb

## 2023-07-13 DIAGNOSIS — E782 Mixed hyperlipidemia: Secondary | ICD-10-CM | POA: Diagnosis present

## 2023-07-13 DIAGNOSIS — I214 Non-ST elevation (NSTEMI) myocardial infarction: Secondary | ICD-10-CM | POA: Insufficient documentation

## 2023-07-13 DIAGNOSIS — Z0000 Encounter for general adult medical examination without abnormal findings: Secondary | ICD-10-CM | POA: Diagnosis not present

## 2023-07-13 DIAGNOSIS — I10 Essential (primary) hypertension: Secondary | ICD-10-CM | POA: Diagnosis present

## 2023-07-13 NOTE — Assessment & Plan Note (Signed)
History of essential hypertension with blood pressure measured today 112/78.  She is on amlodipine, lisinopril and metoprolol.

## 2023-07-13 NOTE — Progress Notes (Signed)
07/13/2023 [name]   07-24-1956  [id number]  Primary Physician Copland, Gwenlyn Found, MD Primary Cardiologist: [name] MD Milagros Loll, [place], [place]  HPI:  [name] is a 67 y.o.  who I last saw December of last year with a history of HTN, obesity, and hypothyroidism . I last saw her in the office  10/28/2020.  She presented on 12/11/12 after a prolonged episode of angina. Her EKG was without acute changes, but she ruled in for NSTEMI. Her troponin peaked at 4.95. She was placed on IV Heparin and underwent diagnostic coronary angiography. Her initial cardiac catheterization on 12/10/2012, performed by Dr. Herbie Baltimore, demonstrated diffuse LAD disease with essentially subtotal occlusion that had the appearance of possible Spontaneous Coronary Artery Dissection. However, with existing CAD, simply diffuse disease was also possible. It was decided to not do any intervention at that time, but to wait and have the patient return, several days later, for a re-look cath to re-evaluate the LAD. She was maintained on medical therapy and had improvement in symtpoms. She returned to the cath lab on 5/16 for re-look. This was also performed by Dr. Herbie Baltimore. She was noted to have progression of LAD disease to total occlusion at the original ~95% subtotal location with improved D1-distal LAD and R-L collaterals (septal and distal RPDA). The images were reviewd by Dr. Allyson Sabal and Dr. Riley Kill and it was concluded that the best course of action was not to proceed with extensive LAD PTCA-PCI, in the absence of on-going symptoms. Medical therapy was recommended. Since I saw her a year ago she's been doing well until this past July when she was admitted with unstable angina. She ruled out for myocardial infarction. She underwent cardiac catheterization by Dr. Herbie Baltimore revealing an occluded LAD in the midportion and otherwise minimal CAD. She did have a 90% small ostial stent second marginal branch stenosis  and 60% PLA stenosis with what appeared to be fairly well preserved LV function. She had an apical wall motion abnormality. Since I saw her a year ago she saw remained currently stable specifically denying chest pain or shortness of breath. Her major complaint is of chronic fatigue and lack of energy.She retired from working as a [employer] on 08/01/17 and seems much happier.   She has developed significant dyspnea over the last several months on exertion but really denies chest pain.  She is on Imdur and ranolazine.  She is a chronically occluded LAD, moderate diagonal branch, obtuse marginal branch and distal dominant RCA disease.  She has right to left collaterals.  I obtained a 2D echocardiogram on her 10/11/2019 reviewed which revealed an EF of 50 to 55% with focal wall motion abnormalities in the LAD territory, mild to moderate MR.  Since changing her walking patterns to no longer walking up inclines her shortness of breath is somewhat improved.   Since I saw her in the office a year and a half ago she continues to do well.  She was having some dizziness when I saw her last which has resolved.  Her lipids are managed by Dr. Rennis Golden with an LDL of 44 on Repatha.  She had a Myoview stress test performed 01/20/2023 which was low risk and nonischemic.  She denies chest pain or shortness of breath.   Current Meds  Medication Sig   allopurinol (ZYLOPRIM) 100 MG tablet Take 3 tablets (300 mg total) by mouth daily.   ALPRAZolam (XANAX) 0.5 MG tablet TAKE ONE TABLET BY MOUTH  THREE TIMES DAILY AS NEEDED FOR ANXIETY   amLODipine (NORVASC) 5 MG tablet TAKE ONE TABLET BY MOUTH ONCE A DAY   aspirin EC 81 MG tablet Take 81 mg by mouth daily. Swallow whole.   clopidogrel (PLAVIX) 75 MG tablet TAKE ONE TABLET BY MOUTH ONE TIME DAILY WITH BREAKFAST   dicyclomine (BENTYL) 10 MG capsule Take 2 capsules (20 mg total) by mouth 4 (four) times daily -  before meals and at bedtime. (Patient taking differently: Take 20 mg by mouth  as needed for spasms.)   Evolocumab (REPATHA SURECLICK) 140 MG/ML SOAJ Inject 140 MG into the skin once every 14 days   ezetimibe (ZETIA) 10 MG tablet TAKE ONE TABLET BY MOUTH ONCE A DAY   lisinopril (ZESTRIL) 5 MG tablet TAKE ONE TABLET BY MOUTH ONE TIME DAILY   metoprolol tartrate (LOPRESSOR) 25 MG tablet Take 1 tablet (25 mg total) by mouth 2 (two) times daily.   nitroGLYCERIN (NITROSTAT) 0.4 MG SL tablet DISSOLVE 1 TABLET UNDER THE TONGUE EVERY 5 MINUTES AS NEEDED FOR CHEST PAIN   ondansetron (ZOFRAN) 4 MG tablet Take 1 tablet (4 mg total) by mouth every 8 (eight) hours as needed for nausea or vomiting.   pantoprazole (PROTONIX) 40 MG tablet TAKE ONE TABLET BY MOUTH ONCE A DAY   predniSONE (DELTASONE) 20 MG tablet Take one by mouth daily for 3-5 days as needed for gout flare   promethazine (PHENERGAN) 25 MG tablet Take 1 tablet (25 mg total) by mouth every 8 (eight) hours as needed for nausea or vomiting.   ranolazine (RANEXA) 1000 MG SR tablet TAKE ONE TABLET BY MOUTH TWICE DAILY   Semaglutide-Weight Management (WEGOVY) 1 MG/0.5ML SOAJ Inject 1 mg into the skin once a week.     No Known Allergies  Social History   Socioeconomic History   Marital status: Single    Spouse name: Not on file   Number of children: 2   Years of education: Not on file   Highest education level: Not on file  Occupational History   Occupation: Drug enforcement    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  Tobacco Use   Smoking status: Never   Smokeless tobacco: Never  Vaping Use   Vaping status: Never Used  Substance and Sexual Activity   Alcohol use: No   Drug use: No   Sexual activity: Not Currently  Other Topics Concern   Not on file  Social History Narrative   Daily caffeine use.   Social Determinants of Health   Financial Resource Strain: Low Risk  (07/13/2023)   Overall Financial Resource Strain (CARDIA)    Difficulty of Paying Living Expenses: Not hard at all  Food Insecurity: No Food Insecurity  (07/13/2023)   Hunger Vital Sign    Worried About Running Out of Food in the Last Year: Never true    Ran Out of Food in the Last Year: Never true  Transportation Needs: No Transportation Needs (07/13/2023)   PRAPARE - Administrator, Civil Service (Medical): No    Lack of Transportation (Non-Medical): No  Physical Activity: Sufficiently Active (07/13/2023)   Exercise Vital Sign    Days of Exercise per Week: 5 days    Minutes of Exercise per Session: 60 min  Stress: No Stress Concern Present (07/13/2023)   [employer] of Occupational Health - Occupational Stress Questionnaire    Feeling of Stress : Not at all  Social Connections: Moderately Integrated (07/13/2023)   Social Connection and Isolation Panel [NHANES]  Frequency of Communication with Friends and Family: More than three times a week    Frequency of Social Gatherings with Friends and Family: More than three times a week    Attends Religious Services: More than 4 times per year    Active Member of [employer] or Organizations: Yes    Attends Engineer, structural: More than 4 times per year    Marital Status: Divorced  Intimate Partner Violence: Not At Risk (07/13/2023)   Humiliation, Afraid, Rape, and Kick questionnaire    Fear of Current or Ex-Partner: No    Emotionally Abused: No    Physically Abused: No    Sexually Abused: No     Review of Systems: General: negative for chills, fever, night sweats or weight changes.  Cardiovascular: negative for chest pain, dyspnea on exertion, edema, orthopnea, palpitations, paroxysmal nocturnal dyspnea or shortness of breath Dermatological: negative for rash Respiratory: negative for cough or wheezing Urologic: negative for hematuria Abdominal: negative for nausea, vomiting, diarrhea, bright red blood per rectum, melena, or hematemesis Neurologic: negative for visual changes, syncope, or dizziness All other systems reviewed and are otherwise negative except as  noted above.    Blood pressure 112/78, pulse 66, height 5\' 2"  (1.575 m), weight 211 lb (95.7 kg), SpO2 95%.  General appearance: alert and no distress Neck: no adenopathy, no carotid bruit, no JVD, supple, symmetrical, trachea midline, and thyroid not enlarged, symmetric, no tenderness/mass/nodules Lungs: clear to auscultation bilaterally Heart: regular rate and rhythm, S1, S2 normal, no murmur, click, rub or gallop Extremities: extremities normal, atraumatic, no cyanosis or edema Pulses: 2+ and symmetric Skin: Skin color, texture, turgor normal. No rashes or lesions Neurologic: Grossly normal  EKG EKG Interpretation Date/Time:  Wednesday July 13 2023 11:29:11 EST Ventricular Rate:  63 PR Interval:  190 QRS Duration:  94 QT Interval:  410 QTC Calculation: 419 R Axis:   -26  Text Interpretation: Normal sinus rhythm Normal ECG When compared with ECG of 17-Apr-2021 04:01, PREVIOUS ECG IS PRESENT Confirmed by Nanetta Batty [phone]) on 07/13/2023 11:59:58 AM    ASSESSMENT AND PLAN:   Essential hypertension History of essential hypertension with blood pressure measured today 112/78.  She is on amlodipine, lisinopril and metoprolol.  Hyperlipidemia History of hyperlipidemia on Zetia and Repatha with lipid profile performed 11/19/22 revealing a total cholesterol 135, LDL 44 and HDL of 66, followed by Dr. Rennis Golden.  NSTEMI (non-ST elevated myocardial infarction) (HCC) History of non-STEMI status post cath 5/11 revealing a subtotally occluded mid LAD thought to be "scad".  Intervention was not performed.  She was recatheterized subsequent to that and found to have an occluded LAD in the midportion without significant disease.  Her LV function was preserved.  Her last catheterization performed Dr. Herbie Baltimore 02/06/2015 revealed an occluded mid LAD with ostial obtuse marginal branch disease in the 90% range.  She is completely asymptomatic.  She is on ranolazine.  She did have a Myoview performed  01/20/2023 that showed no evidence of ischemia.     [name] MD FACP,FACC,FAHA, [facility] 07/13/2023 12:08 PM

## 2023-07-13 NOTE — Assessment & Plan Note (Signed)
History of non-STEMI status post cath 5/11 revealing a subtotally occluded mid LAD thought to be "scad".  Intervention was not performed.  She was recatheterized subsequent to that and found to have an occluded LAD in the midportion without significant disease.  Her LV function was preserved.  Her last catheterization performed Dr. Herbie Baltimore 02/06/2015 revealed an occluded mid LAD with ostial obtuse marginal branch disease in the 90% range.  She is completely asymptomatic.  She is on ranolazine.  She did have a Myoview performed 01/20/2023 that showed no evidence of ischemia.

## 2023-07-13 NOTE — Assessment & Plan Note (Signed)
History of hyperlipidemia on Zetia and Repatha with lipid profile performed 11/19/22 revealing a total cholesterol 135, LDL 44 and HDL of 66, followed by Dr. Rennis Golden.

## 2023-07-13 NOTE — Patient Instructions (Signed)
[name] , Thank you for taking time to come for your Medicare Wellness Visit. I appreciate your ongoing commitment to your health goals. Please review the following plan we discussed and let me know if I can assist you in the future.   These are the goals we discussed:  Goals   None     This is a list of the screening recommended for you and due dates:  Health Maintenance  Topic Date Due   Pneumonia Vaccine (1 of 2 - PCV) Never done   Zoster (Shingles) Vaccine (1 of 2) Never done   COVID-19 Vaccine (4 - 2023-24 season) 04/03/2023   Mammogram  05/10/2024   Medicare Annual Wellness Visit  07/12/2024   DTaP/Tdap/Td vaccine (2 - Td or Tdap) 06/25/2025   Colon Cancer Screening  06/05/2030   DEXA scan (bone density measurement)  Completed   Hepatitis C Screening  Completed   HPV Vaccine  Aged Out   Flu Shot  Discontinued     Next appointment: Follow up in one year for your annual wellness visit    Preventive Care 65 Years and Older, Female Preventive care refers to lifestyle choices and visits with your health care provider that can promote health and wellness. What does preventive care include? A yearly physical exam. This is also called an annual well check. Dental exams once or twice a year. Routine eye exams. Ask your health care provider how often you should have your eyes checked. Personal lifestyle choices, including: Daily care of your teeth and gums. Regular physical activity. Eating a healthy diet. Avoiding tobacco and drug use. Limiting alcohol use. Practicing safe sex. Taking low-dose aspirin every day. Taking vitamin and mineral supplements as recommended by your health care provider. What happens during an annual well check? The services and screenings done by your health care provider during your annual well check will depend on your age, overall health, lifestyle risk factors, and family history of disease. Counseling  Your health care provider may ask  you questions about your: Alcohol use. Tobacco use. Drug use. Emotional well-being. Home and relationship well-being. Sexual activity. Eating habits. History of falls. Memory and ability to understand (cognition). Work and work Astronomer. Reproductive health. Screening  You may have the following tests or measurements: Height, weight, and BMI. Blood pressure. Lipid and cholesterol levels. These may be checked every 5 years, or more frequently if you are over 29 years old. Skin check. Lung cancer screening. You may have this screening every year starting at age 32 if you have a 30-pack-year history of smoking and currently smoke or have quit within the past 15 years. Fecal occult blood test (FOBT) of the stool. You may have this test every year starting at age 45. Flexible sigmoidoscopy or colonoscopy. You may have a sigmoidoscopy every 5 years or a colonoscopy every 10 years starting at age 50. Hepatitis C blood test. Hepatitis B blood test. Sexually transmitted disease (STD) testing. Diabetes screening. This is done by checking your blood sugar (glucose) after you have not eaten for a while (fasting). You may have this done every 1-3 years. Bone density scan. This is done to screen for osteoporosis. You may have this done starting at age 44. Mammogram. This may be done every 1-2 years. Talk to your health care provider about how often you should have regular mammograms. Talk with your health care provider about your test results, treatment options, and if necessary, the need for more tests. Vaccines  Your health  care provider may recommend certain vaccines, such as: Influenza vaccine. This is recommended every year. Tetanus, diphtheria, and acellular pertussis (Tdap, Td) vaccine. You may need a Td booster every 10 years. Zoster vaccine. You may need this after age 33. Pneumococcal 13-valent conjugate (PCV13) vaccine. One dose is recommended after age 70. Pneumococcal  polysaccharide (PPSV23) vaccine. One dose is recommended after age 6. Talk to your health care provider about which screenings and vaccines you need and how often you need them. This information is not intended to replace advice given to you by your health care provider. Make sure you discuss any questions you have with your health care provider. Document Released: 08/15/2015 Document Revised: 04/07/2016 Document Reviewed: 05/20/2015 Elsevier Interactive Patient Education  2017 [employer].  Fall Prevention in the Home Falls can cause injuries. They can happen to people of all ages. There are many things you can do to make your home safe and to help prevent falls. What can I do on the outside of my home? Regularly fix the edges of walkways and driveways and fix any cracks. Remove anything that might make you trip as you walk through a door, such as a raised step or threshold. Trim any bushes or trees on the path to your home. Use bright outdoor lighting. Clear any walking paths of anything that might make someone trip, such as rocks or tools. Regularly check to see if handrails are loose or broken. Make sure that both sides of any steps have handrails. Any raised decks and porches should have guardrails on the edges. Have any leaves, snow, or ice cleared regularly. Use sand or salt on walking paths during winter. Clean up any spills in your garage right away. This includes oil or grease spills. What can I do in the bathroom? Use night lights. Install grab bars by the toilet and in the tub and shower. Do not use towel bars as grab bars. Use non-skid mats or decals in the tub or shower. If you need to sit down in the shower, use a plastic, non-slip stool. Keep the floor dry. Clean up any water that spills on the floor as soon as it happens. Remove soap buildup in the tub or shower regularly. Attach bath mats securely with double-sided non-slip rug tape. Do not have throw rugs and other  things on the floor that can make you trip. What can I do in the bedroom? Use night lights. Make sure that you have a light by your bed that is easy to reach. Do not use any sheets or blankets that are too big for your bed. They should not hang down onto the floor. Have a firm chair that has side arms. You can use this for support while you get dressed. Do not have throw rugs and other things on the floor that can make you trip. What can I do in the kitchen? Clean up any spills right away. Avoid walking on wet floors. Keep items that you use a lot in easy-to-reach places. If you need to reach something above you, use a strong step stool that has a grab bar. Keep electrical cords out of the way. Do not use floor polish or wax that makes floors slippery. If you must use wax, use non-skid floor wax. Do not have throw rugs and other things on the floor that can make you trip. What can I do with my stairs? Do not leave any items on the stairs. Make sure that there are handrails on  both sides of the stairs and use them. Fix handrails that are broken or loose. Make sure that handrails are as long as the stairways. Check any carpeting to make sure that it is firmly attached to the stairs. Fix any carpet that is loose or worn. Avoid having throw rugs at the top or bottom of the stairs. If you do have throw rugs, attach them to the floor with carpet tape. Make sure that you have a light switch at the top of the stairs and the bottom of the stairs. If you do not have them, ask someone to add them for you. What else can I do to help prevent falls? Wear shoes that: Do not have high heels. Have rubber bottoms. Are comfortable and fit you well. Are closed at the toe. Do not wear sandals. If you use a stepladder: Make sure that it is fully opened. Do not climb a closed stepladder. Make sure that both sides of the stepladder are locked into place. Ask someone to hold it for you, if possible. Clearly  mark and make sure that you can see: Any grab bars or handrails. First and last steps. Where the edge of each step is. Use tools that help you move around (mobility aids) if they are needed. These include: Canes. Walkers. Scooters. Crutches. Turn on the lights when you go into a dark area. Replace any light bulbs as soon as they burn out. Set up your furniture so you have a clear path. Avoid moving your furniture around. If any of your floors are uneven, fix them. If there are any pets around you, be aware of where they are. Review your medicines with your doctor. Some medicines can make you feel dizzy. This can increase your chance of falling. Ask your doctor what other things that you can do to help prevent falls. This information is not intended to replace advice given to you by your health care provider. Make sure you discuss any questions you have with your health care provider. Document Released: 05/15/2009 Document Revised: 12/25/2015 Document Reviewed: 08/23/2014 Elsevier Interactive Patient Education  2017 [employer].

## 2023-07-13 NOTE — Progress Notes (Signed)
Subjective:   [name] is a 67 y.o. female who presents for Medicare Annual (Subsequent) preventive examination.  Visit Complete: Virtual I connected with  [name] on 07/13/23 by a audio enabled telemedicine application and verified that I am speaking with the correct person using two identifiers.  Patient Location: Home  Provider Location: Office/Clinic  I discussed the limitations of evaluation and management by telemedicine. The patient expressed understanding and agreed to proceed.  Vital Signs: Because this visit was a virtual/telehealth visit, some criteria may be missing or patient reported. Any vitals not documented were not able to be obtained and vitals that have been documented are patient reported.   Cardiac Risk Factors include: advanced age (>37men, >41 women);dyslipidemia;hypertension;obesity (BMI >30kg/m2)     Objective:    There were no vitals filed for this visit. There is no height or weight on file to calculate BMI.     07/13/2023    8:59 AM 07/09/2022    9:02 AM 04/17/2021    5:25 PM 04/17/2021    5:00 PM 04/17/2021    3:42 AM 07/31/2016    4:35 PM 06/26/2015    1:27 PM  Advanced Directives  Does Patient Have a Medical Advance Directive? Yes Yes   Yes No Yes  Type of Estate agent of [place];Living will Healthcare Power of [place];Living will     Healthcare Power of [place];Living will  Does patient want to make changes to medical advance directive? No - Patient declined No - Patient declined No - Patient declined No - Patient declined No - Patient declined    Copy of Healthcare Power of Attorney in Chart? No - copy requested No - copy requested       Would patient like information on creating a medical advance directive?      No - Patient declined     Current Medications (verified) Outpatient Encounter Medications as of 07/13/2023  Medication Sig   allopurinol (ZYLOPRIM) 100 MG tablet Take 3 tablets (300 mg  total) by mouth daily. (Patient not taking: Reported on 06/06/2023)   ALPRAZolam (XANAX) 0.5 MG tablet TAKE ONE TABLET BY MOUTH THREE TIMES DAILY AS NEEDED FOR ANXIETY   amLODipine (NORVASC) 5 MG tablet TAKE ONE TABLET BY MOUTH ONCE A DAY   aspirin EC 81 MG tablet Take 81 mg by mouth daily. Swallow whole.   clopidogrel (PLAVIX) 75 MG tablet TAKE ONE TABLET BY MOUTH ONE TIME DAILY WITH BREAKFAST   dicyclomine (BENTYL) 10 MG capsule Take 2 capsules (20 mg total) by mouth 4 (four) times daily -  before meals and at bedtime. (Patient taking differently: Take 20 mg by mouth as needed for spasms.)   estradiol (ESTRACE) 0.5 MG tablet Take 0.5 tablets every day by oral route for 30 days.   Evolocumab (REPATHA SURECLICK) 140 MG/ML SOAJ Inject 140 MG into the skin once every 14 days   ezetimibe (ZETIA) 10 MG tablet TAKE ONE TABLET BY MOUTH ONCE A DAY   furosemide (LASIX) 20 MG tablet Take 1 tablet (20 mg total) by mouth daily.   lisinopril (ZESTRIL) 5 MG tablet TAKE ONE TABLET BY MOUTH ONE TIME DAILY   metoprolol tartrate (LOPRESSOR) 25 MG tablet Take 1 tablet (25 mg total) by mouth 2 (two) times daily.   nitroGLYCERIN (NITROSTAT) 0.4 MG SL tablet DISSOLVE 1 TABLET UNDER THE TONGUE EVERY 5 MINUTES AS NEEDED FOR CHEST PAIN (Patient not taking: Reported on 06/06/2023)   ondansetron (ZOFRAN) 4 MG tablet Take  1 tablet (4 mg total) by mouth every 8 (eight) hours as needed for nausea or vomiting.   pantoprazole (PROTONIX) 40 MG tablet TAKE ONE TABLET BY MOUTH ONCE A DAY   predniSONE (DELTASONE) 20 MG tablet Take one by mouth daily for 3-5 days as needed for gout flare   promethazine (PHENERGAN) 25 MG tablet Take 1 tablet (25 mg total) by mouth every 8 (eight) hours as needed for nausea or vomiting.   ranolazine (RANEXA) 1000 MG SR tablet TAKE ONE TABLET BY MOUTH TWICE DAILY   Semaglutide-Weight Management (WEGOVY) 1 MG/0.5ML SOAJ Inject 1 mg into the skin once a week.   No facility-administered encounter  medications on file as of 07/13/2023.    Allergies (verified) Patient has no known allergies.   History: Past Medical History:  Diagnosis Date   Angina effort 05/22/2013   Anxiety    CAD (coronary artery disease), possible SCAD 12/14/2012   Colon polyps    Duodenitis    Dyspnea on exertion    LEXISCAN, 09/30/2008 - normal, EKG negative for ischemia, no ECG changes   Esophageal stricture    Gastritis    GERD (gastroesophageal reflux disease)    Gout    "only from RX given after MI" (01/22/2013)   H/O hiatal hernia    History of esophageal stricture    Hypertension    2D ECHO, 09/30/2008 - EF >55%, normal: RENAL DOPPLER, 03/21/2007 - normal duplex study   Hypothyroidism    IBS (irritable bowel syndrome)    Ischemic colitis (HCC)    Lower GI bleeding    10/07/11 "I've had 3 episodes in the past year"   Myocardial infarction (HCC) 12/10/2012   "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)   Tubular adenoma polyp of rectum 2008   Past Surgical History:  Procedure Laterality Date   ABDOMINAL HYSTERECTOMY  1999   ANTERIOR LUMBAR FUSION  2010   "L4-5" (01/22/2013)   CARDIAC CATHETERIZATION N/A 02/06/2015   Procedure: Left Heart Cath and Coronary Angiography;  Surgeon: Marykay Lex, MD;  Location: [facility] INVASIVE CV LAB;  Service: Cardiovascular;  Laterality: N/A;   ESOPHAGEAL DILATION  08/2010   ESOPHAGEAL DILATION     "once or twice" (01/22/2013)   HAMMER TOE SURGERY Bilateral ~ 2002   LEFT HEART CATHETERIZATION WITH CORONARY ANGIOGRAM N/A 12/11/2012   Procedure: LEFT HEART CATHETERIZATION WITH CORONARY ANGIOGRAM;  Surgeon: Marykay Lex, MD;  Location: [facility] CATH LAB;  Service: Cardiovascular;  Laterality: N/A;   LEFT HEART CATHETERIZATION WITH CORONARY ANGIOGRAM N/A 12/15/2012   Procedure: LEFT HEART CATHETERIZATION WITH CORONARY ANGIOGRAM;  Surgeon: Marykay Lex, MD;  Location: [facility] CATH LAB;  Service: Cardiovascular;  Laterality: N/A;   PERIPHERAL VENOUS STUDY  10/03/2008    No evidence of DVT, superficial thrombosis, or Baker's cyst   TONSILLECTOMY AND ADENOIDECTOMY  1960's   TUBAL LIGATION  1985   Family History  Problem Relation Age of Onset   Colon cancer Brother 69   Kidney disease Mother    Stroke Father    Multiple myeloma Sister    Heart attack Brother    Other Brother    Other Brother    Social History   Socioeconomic History   Marital status: Single    Spouse name: Not on file   Number of children: 2   Years of education: Not on file   Highest education level: Not on file  Occupational History   Occupation: Drug enforcement    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  Tobacco  Use   Smoking status: Never   Smokeless tobacco: Never  Vaping Use   Vaping status: Never Used  Substance and Sexual Activity   Alcohol use: No   Drug use: No   Sexual activity: Not Currently  Other Topics Concern   Not on file  Social History Narrative   Daily caffeine use.   Social Determinants of Health   Financial Resource Strain: Low Risk  (07/13/2023)   Overall Financial Resource Strain (CARDIA)    Difficulty of Paying Living Expenses: Not hard at all  Food Insecurity: No Food Insecurity (07/13/2023)   Hunger Vital Sign    Worried About Running Out of Food in the Last Year: Never true    Ran Out of Food in the Last Year: Never true  Transportation Needs: No Transportation Needs (07/13/2023)   PRAPARE - Administrator, Civil Service (Medical): No    Lack of Transportation (Non-Medical): No  Physical Activity: Sufficiently Active (07/13/2023)   Exercise Vital Sign    Days of Exercise per Week: 5 days    Minutes of Exercise per Session: 60 min  Stress: No Stress Concern Present (07/13/2023)   [employer] of Occupational Health - Occupational Stress Questionnaire    Feeling of Stress : Not at all  Social Connections: Moderately Integrated (07/13/2023)   Social Connection and Isolation Panel [NHANES]    Frequency of Communication with Friends  and Family: More than three times a week    Frequency of Social Gatherings with Friends and Family: More than three times a week    Attends Religious Services: More than 4 times per year    Active Member of [employer] or Organizations: Yes    Attends Engineer, structural: More than 4 times per year    Marital Status: Divorced    Tobacco Counseling Counseling given: Not Answered   Clinical Intake:  Pre-visit preparation completed: Yes  Pain : No/denies pain  Nutritional Risks: None Diabetes: No  How often do you need to have someone help you when you read instructions, pamphlets, or other written materials from your doctor or pharmacy?: 1 - Never  Interpreter Needed?: No  Information entered by :: [employer], CMA   Activities of Daily Living    07/13/2023    8:56 AM  In your present state of health, do you have any difficulty performing the following activities:  Hearing? 0  Vision? 0  Difficulty concentrating or making decisions? 0  Walking or climbing stairs? 0  Dressing or bathing? 0  Doing errands, shopping? 0  Preparing Food and eating ? N  Using the Toilet? N  In the past six months, have you accidently leaked urine? Y  Do you have problems with loss of bowel control? N  Managing your Medications? N  Managing your Finances? N  Housekeeping or managing your Housekeeping? N    Patient Care Team: Copland, Gwenlyn Found, MD as PCP - General (Family Medicine) [name], MD as PCP - Cardiology (Cardiology) Freddy Finner, MD (Inactive) as Consulting Physician (Obstetrics and Gynecology) Hilarie Fredrickson, MD as Consulting Physician (Gastroenterology) Rennis Golden Lisette Abu, MD as Consulting Physician (Cardiology)  Indicate any recent Medical Services you may have received from other than [facility] providers in the past year (date may be approximate).     Assessment:   This is a routine wellness examination for Francely.  Hearing/Vision screen No results  found.   Goals Addressed   None    Depression Screen  07/13/2023    9:04 AM 07/09/2022    9:06 AM 06/03/2022    8:45 AM 04/07/2021    4:13 PM 11/15/2016   11:05 AM  PHQ 2/9 Scores  PHQ - 2 Score 0 2 0 0 0    Fall Risk    07/13/2023    8:58 AM 07/09/2022    9:02 AM 06/03/2022    8:45 AM 04/07/2021    4:13 PM 11/11/2016    9:35 AM  Fall Risk   Falls in the past year? 0 0 0 0 No  Number falls in past yr: 0 0 0 0   Injury with Fall? 0 0 0 0   Risk for fall due to : No Fall Risks No Fall Risks     Follow up Falls evaluation completed Falls evaluation completed Falls evaluation completed Falls evaluation completed     MEDICARE RISK AT HOME: Medicare Risk at Home Any stairs in or around the home?: No If so, are there any without handrails?: No Home free of loose throw rugs in walkways, pet beds, electrical cords, etc?: Yes Adequate lighting in your home to reduce risk of falls?: Yes Life alert?: No Use of a cane, walker or w/c?: No Grab bars in the bathroom?: No Shower chair or bench in shower?: No Elevated toilet seat or a handicapped toilet?: Yes (comfort height)  TIMED UP AND GO:  Was the test performed?  No    Cognitive Function:        07/13/2023    9:05 AM 07/09/2022    9:15 AM  6CIT Screen  What Year? 0 points 0 points  What month? 0 points 0 points  What time? 0 points 0 points  Count back from 20 0 points 0 points  Months in reverse 0 points 0 points  Repeat phrase 0 points 0 points  Total Score 0 points 0 points    Immunizations Immunization History  Administered Date(s) Administered   Fluad Quad(high Dose 65+) 04/20/2021, 06/03/2022   Fluad Trivalent(High Dose 65+) 05/02/2023   PFIZER(Purple Top)SARS-COV-2 Vaccination 10/16/2019, 11/06/2019, 04/02/2020   Tdap 06/26/2015    TDAP status: Up to date  Flu Vaccine status: Up to date  Pneumococcal vaccine status: Due, Education has been provided regarding the importance of this vaccine. Advised may  receive this vaccine at local pharmacy or Health Dept. Aware to provide a copy of the vaccination record if obtained from local pharmacy or Health Dept. Verbalized acceptance and understanding.  Covid-19 vaccine status: Information provided on how to obtain vaccines.   Qualifies for Shingles Vaccine? Yes   Zostavax completed No   Shingrix Completed?: No.    Education has been provided regarding the importance of this vaccine. Patient has been advised to call insurance company to determine out of pocket expense if they have not yet received this vaccine. Advised may also receive vaccine at local pharmacy or Health Dept. Verbalized acceptance and understanding.  Screening Tests Health Maintenance  Topic Date Due   Pneumonia Vaccine 27+ Years old (1 of 2 - PCV) Never done   Zoster Vaccines- Shingrix (1 of 2) Never done   COVID-19 Vaccine (4 - 2023-24 season) 04/03/2023   Medicare Annual Wellness (AWV)  07/10/2023   MAMMOGRAM  05/10/2024   DTaP/Tdap/Td (2 - Td or Tdap) 06/25/2025   Colonoscopy  06/05/2030   DEXA SCAN  Completed   Hepatitis C Screening  Completed   HPV VACCINES  Aged Out   INFLUENZA VACCINE  Discontinued  Health Maintenance  Health Maintenance Due  Topic Date Due   Pneumonia Vaccine 82+ Years old (1 of 2 - PCV) Never done   Zoster Vaccines- Shingrix (1 of 2) Never done   COVID-19 Vaccine (4 - 2023-24 season) 04/03/2023   Medicare Annual Wellness (AWV)  07/10/2023    Colorectal cancer screening: Type of screening: Colonoscopy. Completed 06/06/23. Repeat every 7 years  Mammogram status: Completed 05/10/22. Repeat every year  Bone Density status: Completed 05/10/22. Results reflect: Bone density results: NORMAL. Repeat every 2 years.  Lung Cancer Screening: (Low Dose CT Chest recommended if Age 57-80 years, 20 pack-year currently smoking OR have quit w/in 15years.) does not qualify.   Additional Screening:  Hepatitis C Screening: does qualify; Completed  04/06/18  Vision Screening: Recommended annual ophthalmology exams for early detection of glaucoma and other disorders of the eye. Is the patient up to date with their annual eye exam?  Yes  Who is the provider or what is the name of the office in which the patient attends annual eye exams? [facility] If pt is not established with a provider, would they like to be referred to a provider to establish care? No .   Dental Screening: Recommended annual dental exams for proper oral hygiene  Diabetic Foot Exam: N/a  Community Resource Referral / Chronic Care Management: CRR required this visit?  No   CCM required this visit?  No     Plan:     I have personally reviewed and noted the following in the patient's chart:   Medical and social history Use of alcohol, tobacco or illicit drugs  Current medications and supplements including opioid prescriptions. Patient is not currently taking opioid prescriptions. Functional ability and status Nutritional status Physical activity Advanced directives List of other physicians Hospitalizations, surgeries, and ER visits in previous 12 months Vitals Screenings to include cognitive, depression, and falls Referrals and appointments  In addition, I have reviewed and discussed with patient certain preventive protocols, quality metrics, and best practice recommendations. A written personalized care plan for preventive services as well as general preventive health recommendations were provided to patient.     Donne Anon, CMA   07/13/2023   After Visit Summary: (MyChart) Due to this being a telephonic visit, the after visit summary with patients personalized plan was offered to patient via MyChart   Nurse Notes: None

## 2023-07-13 NOTE — Patient Instructions (Signed)
Medication Instructions:  Your physician recommends that you continue on your current medications as directed. Please refer to the Current Medication list given to you today.  *If you need a refill on your cardiac medications before your next appointment, please call your pharmacy*   Follow-Up: At [facility], you and your health needs are our priority.  As part of our continuing mission to provide you with exceptional heart care, we have created designated Provider Care Teams.  These Care Teams include your primary Cardiologist (physician) and Advanced Practice Providers (APPs -  Physician Assistants and Nurse Practitioners) who all work together to provide you with the care you need, when you need it.  We recommend signing up for the patient portal called "MyChart".  Sign up information is provided on this After Visit Summary.  MyChart is used to connect with patients for Virtual Visits (Telemedicine).  Patients are able to view lab/test results, encounter notes, upcoming appointments, etc.  Non-urgent messages can be sent to your provider as well.   To learn more about what you can do with MyChart, go to [email or web address].    Your next appointment:   6 month(s)  Provider:   Hao Meng, [name]      Then, Jonathan Berry, MD will plan to see you again in 12 month(s).   

## 2023-08-08 ENCOUNTER — Encounter: Payer: Self-pay | Admitting: Family Medicine

## 2023-08-09 MED ORDER — WEGOVY 1.7 MG/0.75ML ~~LOC~~ SOAJ: 1.7000 mg | SUBCUTANEOUS | 1 refills | Status: DC

## 2023-08-11 ENCOUNTER — Other Ambulatory Visit (HOSPITAL_BASED_OUTPATIENT_CLINIC_OR_DEPARTMENT_OTHER): Payer: Self-pay | Admitting: Cardiovascular Disease

## 2023-08-24 ENCOUNTER — Other Ambulatory Visit: Payer: Self-pay | Admitting: Family Medicine

## 2023-09-12 LAB — BASIC METABOLIC PANEL
BUN: 21 (ref 4–21)
CO2: 27 — AB (ref 13–22)
Chloride: 101 (ref 99–108)
Creatinine: 1.3 — AB (ref 0.5–1.1)
Glucose: 90
Potassium: 4.2 meq/L (ref 3.5–5.1)
Sodium: 138 (ref 137–147)

## 2023-09-12 LAB — COMPREHENSIVE METABOLIC PANEL
Albumin: 4.3 (ref 3.5–5.0)
Calcium: 10.3 (ref 8.7–10.7)
eGFR: 44

## 2023-09-13 LAB — LAB REPORT - SCANNED
Albumin, Urine POC: 7.1
Creatinine, POC: 245.7 mg/dL
EGFR: 44
Microalb Creat Ratio: 3

## 2023-09-19 ENCOUNTER — Other Ambulatory Visit: Payer: Self-pay | Admitting: Family Medicine

## 2023-09-19 DIAGNOSIS — F411 Generalized anxiety disorder: Secondary | ICD-10-CM

## 2023-09-20 LAB — LAB REPORT - SCANNED: Phosphorus: 3.4

## 2023-10-01 ENCOUNTER — Other Ambulatory Visit: Payer: Self-pay | Admitting: Family

## 2023-10-05 ENCOUNTER — Encounter: Payer: Self-pay | Admitting: Family Medicine

## 2023-10-05 MED ORDER — WEGOVY 2.4 MG/0.75ML ~~LOC~~ SOAJ: 2.4000 mg | SUBCUTANEOUS | 2 refills | Status: DC

## 2023-10-06 ENCOUNTER — Other Ambulatory Visit (HOSPITAL_COMMUNITY): Payer: Self-pay

## 2023-10-06 ENCOUNTER — Ambulatory Visit: Payer: Medicare Other | Admitting: Family Medicine

## 2023-10-06 ENCOUNTER — Telehealth: Payer: Self-pay | Admitting: Pharmacy Technician

## 2023-10-06 NOTE — Patient Instructions (Addendum)
 Good to see you again today - I will be in touch with your labs asap We are going to treat you for a UTI and possible diverticulitis with augmentin antibiotic  If you don't feel like eating liquids only for a few days may help your gut- if you are hungry use soft foods for a few days  If you are getting worse please contact me- we can get a CT if needed

## 2023-10-06 NOTE — Progress Notes (Signed)
 [facility] at [facility] [address], [address] [place], [place] [address] [phone]  Date:  10/12/2023   Name:  [name]   DOB:  04-04-1956   MRN:  [id number]  PCP:  Pearline Cables, MD    Chief Complaint: Abdominal Pain (Pt says this is a UTI x 2 weeks. Burning with urination. Left lower abd pain )   History of Present Illness:  [name] is a 68 y.o. very pleasant female patient who presents with the following:  Pt seen today with concern of lower, left-sided abd pain- she has noted sx for about a week.  She suspects this is a UTI as she also has dysuria with urination She has not noticed this type of abdominal pain with UTI in the past; she notes at her last colonoscopy she was found to have diverticulosis She does vomit occasionally with wegovy but this is not new - she wishes to continue taking it as she is having success losing weight. No diarrhea No fever noted No hematuria   Last seen by myself in September- at that time she was using [facility] for weight loss and was making some progress We just went to up 2.4 mg.  I offered to decrease her dose in hopes of decreasing side effects, for now she prefers to continue 2.4 mg  History of HTN, CAD/NSTEMI, obesity, hyperlipidemia, IBS, hypothyroidism, GI bleed 2022, chronic moderate renal insufficiency  She is under nephrology care-most recent visit with [facility] was in February   Seen by cardiology in December: Essential hypertension History of essential hypertension with blood pressure measured today 112/78.  She is on amlodipine, lisinopril and metoprolol. Hyperlipidemia History of hyperlipidemia on Zetia and Repatha with lipid profile performed 11/19/22 revealing a total cholesterol 135, LDL 44 and HDL of 66, followed by Dr. Rennis Golden. NSTEMI (non-ST elevated myocardial infarction) (HCC) History of non-STEMI status post cath 5/11 revealing a subtotally  occluded mid LAD thought to be "scad".  Intervention was not performed.  She was recatheterized subsequent to that and found to have an occluded LAD in the midportion without significant disease.  Her LV function was preserved.  Her last catheterization performed Dr. Herbie Baltimore 02/06/2015 revealed an occluded mid LAD with ostial obtuse marginal branch disease in the 90% range.  She is completely asymptomatic.  She is on ranolazine.  She did have a Myoview performed 01/20/2023 that showed no evidence of ischemia.  Recommend shingrix, pneumonia vaccine  She does have known history of hip arthritis - she is seeing Dr Shelle Iron for this     Patient Active Problem List   Diagnosis Date Noted   Family history of colon cancer 04/07/2023   Antiplatelet or antithrombotic long-term use 04/07/2023   Esophageal stenosis 04/07/2023   Acute GI bleeding 04/17/2021   Colitis 04/17/2021   Lower abdominal pain 01/28/2021   Dyspnea on exertion 10/24/2019   Pre-diabetes 01/14/2017   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   Tachycardia, somewhat irregular, episodic 10/08/2013   Dizziness 10/08/2013   Exertional angina (HCC) 05/22/2013   Unstable angina (HCC) 01/22/2013   Hyperlipidemia 01/01/2013   CAD (coronary artery disease), possible SCAD 12/14/2012   NSTEMI (non-ST elevated myocardial infarction) (HCC) 12/10/2012   Essential hypertension 12/10/2012   Hypothyroidism 12/10/2012   Obesity (BMI 35.0-39.9 without comorbidity) 12/10/2012   Gastroenteritis 10/08/2011   Acute ischemic colitis (HCC) 08/09/2011   Dysphagia 07/01/2010   RECTAL BLEEDING Jan 2013 11/04/2009  GERD 02/11/2009   Irritable bowel syndrome 02/11/2009   Generalized abdominal pain 02/11/2009   History of colonic polyps 02/11/2009    Past Medical History:  Diagnosis Date   Angina effort 05/22/2013   Anxiety    CAD (coronary artery disease), possible SCAD 12/14/2012   Colon polyps    Duodenitis    Dyspnea on exertion    LEXISCAN,  09/30/2008 - normal, EKG negative for ischemia, no ECG changes   Esophageal stricture    Gastritis    GERD (gastroesophageal reflux disease)    Gout    "only from RX given after MI" (01/22/2013)   H/O hiatal hernia    History of esophageal stricture    Hypertension    2D ECHO, 09/30/2008 - EF >55%, normal: RENAL DOPPLER, 03/21/2007 - normal duplex study   Hypothyroidism    IBS (irritable bowel syndrome)    Ischemic colitis (HCC)    Lower GI bleeding    10/07/11 "I've had 3 episodes in the past year"   Myocardial infarction (HCC) 12/10/2012   "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)   Tubular adenoma polyp of rectum 2008    Past Surgical History:  Procedure Laterality Date   ABDOMINAL HYSTERECTOMY  1999   ANTERIOR LUMBAR FUSION  2010   "L4-5" (01/22/2013)   CARDIAC CATHETERIZATION N/A 02/06/2015   Procedure: Left Heart Cath and Coronary Angiography;  Surgeon: Marykay Lex, MD;  Location: [facility] INVASIVE CV LAB;  Service: Cardiovascular;  Laterality: N/A;   ESOPHAGEAL DILATION  08/2010   ESOPHAGEAL DILATION     "once or twice" (01/22/2013)   HAMMER TOE SURGERY Bilateral ~ 2002   LEFT HEART CATHETERIZATION WITH CORONARY ANGIOGRAM N/A 12/11/2012   Procedure: LEFT HEART CATHETERIZATION WITH CORONARY ANGIOGRAM;  Surgeon: Marykay Lex, MD;  Location: [facility] CATH LAB;  Service: Cardiovascular;  Laterality: N/A;   LEFT HEART CATHETERIZATION WITH CORONARY ANGIOGRAM N/A 12/15/2012   Procedure: LEFT HEART CATHETERIZATION WITH CORONARY ANGIOGRAM;  Surgeon: Marykay Lex, MD;  Location[facility] C[facility] LAB;  Service: Cardiovascular;  Laterality: N/A;   PERIPHERAL VENOUS STUDY  10/03/2008   No evidence of DVT, superficial thrombosis, or Baker's cyst   TONSILLECTOMY AND ADENOIDECTOMY  1960's   TUBAL LIGATION  1985    Social History   Tobacco Use   Smoking status: Never   Smokeless tobacco: Never  Vaping Use   Vaping status: Never Used  Substance Use Topics   Alcohol use: No   Drug use: No     Family History  Problem Relation Age of Onset   Colon cancer Brother 86   Kidney disease Mother    Stroke Father    Multiple myeloma Sister    Heart attack Brother    Other Brother    Other Brother     No Known Allergies  Medication list has been reviewed and updated.  Current Outpatient Medications on File Prior to Visit  Medication Sig Dispense Refill   allopurinol (ZYLOPRIM) 100 MG tablet Take 3 tablets (300 mg total) by mouth daily. 270 tablet 3   ALPRAZolam (XANAX) 0.5 MG tablet TAKE ONE TABLET BY MOUTH THREE TIMES DAILY AS NEEDED FOR ANXIETY 90 tablet 0   aspirin EC 81 MG tablet Take 81 mg by mouth daily. Swallow whole.     clopidogrel (PLAVIX) 75 MG tablet TAKE ONE TABLET BY MOUTH ONE TIME DAILY WITH BREAKFAST 90 tablet 2   dicyclomine (BENTYL) 10 MG capsule Take 2 capsules (20 mg total) by mouth 4 (four) times  daily -  before meals and at bedtime. (Patient taking differently: Take 20 mg by mouth as needed for spasms.) 240 capsule 0   Evolocumab (REPATHA SURECLICK) 140 MG/ML SOAJ Inject 140 MG into the skin once every 14 days 2 mL 11   ezetimibe (ZETIA) 10 MG tablet TAKE ONE TABLET BY MOUTH ONCE A DAY 90 tablet 3   lisinopril (ZESTRIL) 5 MG tablet TAKE ONE TABLET BY MOUTH ONE TIME DAILY 90 tablet 3   metoprolol tartrate (LOPRESSOR) 25 MG tablet Take 1 tablet (25 mg total) by mouth 2 (two) times daily. 180 tablet 3   nitroGLYCERIN (NITROSTAT) 0.4 MG SL tablet DISSOLVE 1 TABLET UNDER THE TONGUE EVERY 5 MINUTES AS NEEDED FOR CHEST PAIN 25 tablet 3   pantoprazole (PROTONIX) 40 MG tablet TAKE ONE TABLET BY MOUTH ONCE A DAY 90 tablet 3   predniSONE (DELTASONE) 20 MG tablet Take one by mouth daily for 3-5 days as needed for gout flare 30 tablet 1   promethazine (PHENERGAN) 25 MG tablet Take 1 tablet (25 mg total) by mouth every 8 (eight) hours as needed for nausea or vomiting. 20 tablet 0   ranolazine (RANEXA) 1000 MG SR tablet TAKE ONE TABLET BY MOUTH TWICE DAILY 180 tablet 0    Semaglutide-Weight Management (WEGOVY) 2.4 MG/0.75ML SOAJ Inject 2.4 mg into the skin once a week. 9 mL 2   amLODipine (NORVASC) 5 MG tablet TAKE ONE TABLET BY MOUTH ONCE A DAY 90 tablet 2   No current facility-administered medications on file prior to visit.    Review of Systems:  As per HPI- otherwise negative.  Wt Readings from Last 3 Encounters:  10/12/23 202 lb 12.8 oz (92 kg)  07/13/23 211 lb (95.7 kg)  06/06/23 215 lb (97.5 kg)    Physical Examination: Vitals:   10/12/23 0914  BP: 118/72  Pulse: 78  Resp: 18  Temp: 97.8 F (36.6 C)  SpO2: 95%   Vitals:   10/12/23 0914  Weight: 202 lb 12.8 oz (92 kg)  Height: 5\' 2"  (1.575 m)   Body mass index is 37.09 kg/m. Ideal Body Weight: Weight in (lb) to have BMI = 25: 136.4  GEN: no acute distress. Obese, looks well  HEENT: Atraumatic, Normocephalic.  Ears and Nose: No external deformity. CV: RRR, No M/G/R. No JVD. No thrill. No extra heart sounds. PULM: CTA B, no wheezes, crackles, rhonchi. No retractions. No resp. distress. No accessory muscle use. ABD: S, ND, +BS. No rebound. No HSM.  Minimal left lower quadrant tenderness on exam today EXTR: No c/c/e PSYCH: Normally interactive. Conversant.   Results for orders placed or performed in visit on 10/12/23  Urine Culture   Collection Time: 10/12/23  9:53 AM   Specimen: Urine  Result Value Ref Range   MICRO NUMBER: [id number]    SPECIMEN QUALITY: Adequate    Sample Source URINE    STATUS: FINAL    ISOLATE 1: Escherichia coli (A)       Susceptibility   Escherichia coli - URINE CULTURE, REFLEX    AMOX/CLAVULANIC <=2 Sensitive     AMPICILLIN 4 Sensitive     AMPICILLIN/SULBACTAM <=2 Sensitive     CEFAZOLIN* <=4 Not Reportable      * For infections other than uncomplicated UTI caused by E. coli, K. pneumoniae or P. mirabilis: Cefazolin is resistant if MIC > or = 8 mcg/mL. (Distinguishing susceptible versus intermediate for isolates with MIC < or = 4 mcg/mL  requires additional testing.) For uncomplicated UTI  caused by E. coli, K. pneumoniae or P. mirabilis: Cefazolin is susceptible if MIC <32 mcg/mL and predicts susceptible to the oral agents cefaclor, cefdinir, cefpodoxime, cefprozil, cefuroxime, cephalexin and loracarbef.     CEFTAZIDIME <=1 Sensitive     CEFEPIME <=1 Sensitive     CEFTRIAXONE <=1 Sensitive     CIPROFLOXACIN <=0.25 Sensitive     LEVOFLOXACIN <=0.12 Sensitive     GENTAMICIN <=1 Sensitive     IMIPENEM <=0.25 Sensitive     NITROFURANTOIN <=16 Sensitive     PIP/TAZO <=4 Sensitive     TOBRAMYCIN <=1 Sensitive     TRIMETH/SULFA* <=20 Sensitive      * For infections other than uncomplicated UTI caused by E. coli, K. pneumoniae or P. mirabilis: Cefazolin is resistant if MIC > or = 8 mcg/mL. (Distinguishing susceptible versus intermediate for isolates with MIC < or = 4 mcg/mL requires additional testing.) For uncomplicated UTI caused by E. coli, K. pneumoniae or P. mirabilis: Cefazolin is susceptible if MIC <32 mcg/mL and predicts susceptible to the oral agents cefaclor, cefdinir, cefpodoxime, cefprozil, cefuroxime, cephalexin and loracarbef. Legend: S = Susceptible  I = Intermediate R = Resistant  NS = Not susceptible SDD = Susceptible Dose Dependent * = Not Tested  NR = Not Reported **NN = See Therapy Comments   Urine Microscopic Only   Collection Time: 10/12/23  9:53 AM  Result Value Ref Range   WBC, UA TNTC(>50/hpf) (A) 0-2/hpf   RBC / HPF 0-2/hpf 0-2/hpf   Squamous Epithelial / HPF Rare(0-4/hpf) Rare(0-4/hpf)   Bacteria, UA Many(>50/hpf) (A) None  CBC   Collection Time: 10/12/23  9:53 AM  Result Value Ref Range   WBC 8.3 4.0 - 10.5 K/uL   RBC 4.19 3.87 - 5.11 Mil/uL   Platelets 320.0 150.0 - 400.0 K/uL   Hemoglobin 12.9 12.0 - 15.0 g/dL   HCT 78.2 95.6 - 21.3 %   MCV 93.7 78.0 - 100.0 fl   MCHC 33.0 30.0 - 36.0 g/dL   RDW 08.6 57.8 - 46.9 %  Comprehensive metabolic panel   Collection Time:  10/12/23  9:53 AM  Result Value Ref Range   Sodium 137 135 - 145 mEq/L   Potassium 4.3 3.5 - 5.1 mEq/L   Chloride 100 96 - 112 mEq/L   CO2 28 19 - 32 mEq/L   Glucose, Bld 97 70 - 99 mg/dL   BUN 19 6 - 23 mg/dL   Creatinine, Ser 6.29 (H) 0.40 - 1.20 mg/dL   Total Bilirubin 0.6 0.2 - 1.2 mg/dL   Alkaline Phosphatase 98 39 - 117 U/L   AST 11 0 - 37 U/L   ALT 10 0 - 35 U/L   Total Protein 6.8 6.0 - 8.3 g/dL   Albumin 3.9 3.5 - 5.2 g/dL   GFR 52.84 (L) >13.24 mL/min   Calcium 9.8 8.4 - 10.5 mg/dL  POCT Urinalysis Dipstick   Collection Time: 10/12/23  9:53 AM  Result Value Ref Range   Color, UA yellow    Clarity, UA cloudy    Glucose, UA Negative Negative   Bilirubin, UA moderate    Ketones, UA negative    Spec Grav, UA 1.020 1.010 - 1.025   Blood, UA small    pH, UA 6.0 5.0 - 8.0   Protein, UA Positive (A) Negative   Urobilinogen, UA 0.2 0.2 or 1.0 E.U./dL   Nitrite, UA positive    Leukocytes, UA Large (3+) (A) Negative   Appearance cloudy  Odor none      Assessment and Plan: LLQ pain - Plan: CBC, Comprehensive metabolic panel, amoxicillin-clavulanate (AUGMENTIN) 875-125 MG tablet, POCT Urinalysis Dipstick  Dysuria - Plan: Urine Culture, Urine Microscopic Only  Patient seen today with concern of dysuria and left lower quadrant pain.  Pain may be due to urinary tract infection, also consider possibility of diverticulitis.  She does have small blood on dipstick, urine micro exam is pending.  Discussed options with patient.  We could do a CT scan if she would like to definitively determine if she has diverticulitis.  However given known history of diverticulosis and mild symptoms she would prefer to start treatment with Augmentin and defer CT for the time being.  This should also treat most UTI infections.  Discussed dietary modification for a few days which should also help.  I will be in touch with her blood work, she agrees to keep me closely posted on her  progress Signed Abbe Amsterdam, MD  Received labs as below, message to patient  Results for orders placed or performed in visit on 10/12/23  Urine Culture   Collection Time: 10/12/23  9:53 AM   Specimen: Urine  Result Value Ref Range   MICRO NUMBER: [id number]    SPECIMEN QUALITY: Adequate    Sample Source URINE    STATUS: FINAL    ISOLATE 1: Escherichia coli (A)       Susceptibility   Escherichia coli - URINE CULTURE, REFLEX    AMOX/CLAVULANIC <=2 Sensitive     AMPICILLIN 4 Sensitive     AMPICILLIN/SULBACTAM <=2 Sensitive     CEFAZOLIN* <=4 Not Reportable      * For infections other than uncomplicated UTI caused by E. coli, K. pneumoniae or P. mirabilis: Cefazolin is resistant if MIC > or = 8 mcg/mL. (Distinguishing susceptible versus intermediate for isolates with MIC < or = 4 mcg/mL requires additional testing.) For uncomplicated UTI caused by E. coli, K. pneumoniae or P. mirabilis: Cefazolin is susceptible if MIC <32 mcg/mL and predicts susceptible to the oral agents cefaclor, cefdinir, cefpodoxime, cefprozil, cefuroxime, cephalexin and loracarbef.     CEFTAZIDIME <=1 Sensitive     CEFEPIME <=1 Sensitive     CEFTRIAXONE <=1 Sensitive     CIPROFLOXACIN <=0.25 Sensitive     LEVOFLOXACIN <=0.12 Sensitive     GENTAMICIN <=1 Sensitive     IMIPENEM <=0.25 Sensitive     NITROFURANTOIN <=16 Sensitive     PIP/TAZO <=4 Sensitive     TOBRAMYCIN <=1 Sensitive     TRIMETH/SULFA* <=20 Sensitive      * For infections other than uncomplicated UTI caused by E. coli, K. pneumoniae or P. mirabilis: Cefazolin is resistant if MIC > or = 8 mcg/mL. (Distinguishing susceptible versus intermediate for isolates with MIC < or = 4 mcg/mL requires additional testing.) For uncomplicated UTI caused by E. coli, K. pneumoniae or P. mirabilis: Cefazolin is susceptible if MIC <32 mcg/mL and predicts susceptible to the oral agents cefaclor, cefdinir, cefpodoxime, cefprozil, cefuroxime,  cephalexin and loracarbef. Legend: S = Susceptible  I = Intermediate R = Resistant  NS = Not susceptible SDD = Susceptible Dose Dependent * = Not Tested  NR = Not Reported **NN = See Therapy Comments   Urine Microscopic Only   Collection Time: 10/12/23  9:53 AM  Result Value Ref Range   WBC, UA TNTC(>50/hpf) (A) 0-2/hpf   RBC / HPF 0-2/hpf 0-2/hpf   Squamous Epithelial / HPF Rare(0-4/hpf) Rare(0-4/hpf)   Bacteria, UA Many(>50/hpf) (  A) None  CBC   Collection Time: 10/12/23  9:53 AM  Result Value Ref Range   WBC 8.3 4.0 - 10.5 K/uL   RBC 4.19 3.87 - 5.11 Mil/uL   Platelets 320.0 150.0 - 400.0 K/uL   Hemoglobin 12.9 12.0 - 15.0 g/dL   HCT 81.1 91.4 - 78.2 %   MCV 93.7 78.0 - 100.0 fl   MCHC 33.0 30.0 - 36.0 g/dL   RDW 95.6 21.3 - 08.6 %  Comprehensive metabolic panel   Collection Time: 10/12/23  9:53 AM  Result Value Ref Range   Sodium 137 135 - 145 mEq/L   Potassium 4.3 3.5 - 5.1 mEq/L   Chloride 100 96 - 112 mEq/L   CO2 28 19 - 32 mEq/L   Glucose, Bld 97 70 - 99 mg/dL   BUN 19 6 - 23 mg/dL   Creatinine, Ser 5.78 (H) 0.40 - 1.20 mg/dL   Total Bilirubin 0.6 0.2 - 1.2 mg/dL   Alkaline Phosphatase 98 39 - 117 U/L   AST 11 0 - 37 U/L   ALT 10 0 - 35 U/L   Total Protein 6.8 6.0 - 8.3 g/dL   Albumin 3.9 3.5 - 5.2 g/dL   GFR 46.96 (L) >29.52 mL/min   Calcium 9.8 8.4 - 10.5 mg/dL  POCT Urinalysis Dipstick   Collection Time: 10/12/23  9:53 AM  Result Value Ref Range   Color, UA yellow    Clarity, UA cloudy    Glucose, UA Negative Negative   Bilirubin, UA moderate    Ketones, UA negative    Spec Grav, UA 1.020 1.010 - 1.025   Blood, UA small    pH, UA 6.0 5.0 - 8.0   Protein, UA Positive (A) Negative   Urobilinogen, UA 0.2 0.2 or 1.0 E.U./dL   Nitrite, UA positive    Leukocytes, UA Large (3+) (A) Negative   Appearance cloudy    Odor none    Addnd 3/14- received her urine culture - message to pt

## 2023-10-06 NOTE — Telephone Encounter (Signed)
 Pharmacy Patient Advocate Encounter  Received notification from [employer] that Prior Authorization for repatha has been APPROVED from 07/08/23 to 10/05/24. Ran test claim, Copay is $0.00- one month. This test claim was processed through [facility]- copay amounts may vary at other pharmacies due to pharmacy/plan contracts, or as the patient moves through the different stages of their insurance plan.   PA #/Case ID/Reference #: [id number]

## 2023-10-06 NOTE — Telephone Encounter (Signed)
 Pharmacy Patient Advocate Encounter   Received notification from CoverMyMeds that prior authorization for Repatha is required/requested.   Insurance verification completed.   The patient is insured through [employer] .   Per test claim: PA required; PA submitted to above mentioned insurance via CoverMyMeds Key/confirmation #/EOC [id number] Status is pending

## 2023-10-11 ENCOUNTER — Other Ambulatory Visit (HOSPITAL_COMMUNITY): Payer: Self-pay

## 2023-10-12 ENCOUNTER — Other Ambulatory Visit: Payer: Self-pay | Admitting: Family Medicine

## 2023-10-12 ENCOUNTER — Encounter: Payer: Self-pay | Admitting: Family Medicine

## 2023-10-12 ENCOUNTER — Ambulatory Visit: Admitting: Family Medicine

## 2023-10-12 VITALS — BP 118/72 | HR 78 | Temp 97.8°F | Resp 18 | Ht 62.0 in | Wt 202.8 lb

## 2023-10-12 DIAGNOSIS — R300 Dysuria: Secondary | ICD-10-CM | POA: Diagnosis not present

## 2023-10-12 DIAGNOSIS — R1032 Left lower quadrant pain: Secondary | ICD-10-CM

## 2023-10-12 LAB — COMPREHENSIVE METABOLIC PANEL
ALT: 10 U/L (ref 0–35)
AST: 11 U/L (ref 0–37)
Albumin: 3.9 g/dL (ref 3.5–5.2)
Alkaline Phosphatase: 98 U/L (ref 39–117)
BUN: 19 mg/dL (ref 6–23)
CO2: 28 meq/L (ref 19–32)
Calcium: 9.8 mg/dL (ref 8.4–10.5)
Chloride: 100 meq/L (ref 96–112)
Creatinine, Ser: 1.32 mg/dL — ABNORMAL HIGH (ref 0.40–1.20)
GFR: 41.77 mL/min — ABNORMAL LOW (ref >60.00)
Glucose, Bld: 97 mg/dL (ref 70–99)
Potassium: 4.3 meq/L (ref 3.5–5.1)
Sodium: 137 meq/L (ref 135–145)
Total Bilirubin: 0.6 mg/dL (ref 0.2–1.2)
Total Protein: 6.8 g/dL (ref 6.0–8.3)

## 2023-10-12 LAB — POCT URINALYSIS DIPSTICK
Glucose, UA: NEGATIVE
Ketones, UA: NEGATIVE
Nitrite, UA: POSITIVE
Protein, UA: POSITIVE — AB
Spec Grav, UA: 1.02 (ref 1.010–1.025)
Urobilinogen, UA: 0.2 U/dL
pH, UA: 6 (ref 5.0–8.0)

## 2023-10-12 LAB — CBC
HCT: 39.3 % (ref 36.0–46.0)
Hemoglobin: 12.9 g/dL (ref 12.0–15.0)
MCHC: 33 g/dL (ref 30.0–36.0)
MCV: 93.7 fl (ref 78.0–100.0)
Platelets: 320 10*3/uL (ref 150.0–400.0)
RBC: 4.19 Mil/uL (ref 3.87–5.11)
RDW: 13.6 % (ref 11.5–15.5)
WBC: 8.3 10*3/uL (ref 4.0–10.5)

## 2023-10-12 LAB — URINALYSIS, MICROSCOPIC ONLY

## 2023-10-12 MED ORDER — AMOXICILLIN-POT CLAVULANATE 875-125 MG PO TABS: 1.0000 | ORAL_TABLET | Freq: Two times a day (BID) | ORAL | 0 refills | Status: DC

## 2023-10-12 MED ORDER — ONDANSETRON HCL 4 MG PO TABS
4.0000 mg | ORAL_TABLET | Freq: Three times a day (TID) | ORAL | 1 refills | Status: DC | PRN
Start: 2023-10-12 — End: 2024-02-07

## 2023-10-12 NOTE — Telephone Encounter (Signed)
 Copied from CRM [id number]. Topic: Clinical - Medication Refill >> Oct 12, 2023 10:25 AM Isabell A wrote: Most Recent Primary Care Visit:  Provider: Juel Burrow  Department: [facility]  Visit Type: MEDICARE AWV, SEQUENTIAL  Date: 07/13/2023  Medication: ondansetron (ZOFRAN) 4 MG tablet  Patient seen today & forgot to mention she needed refills for this medication.   Has the patient contacted their pharmacy? No (Agent: If no, request that the patient contact the pharmacy for the refill. If patient does not wish to contact the pharmacy document the reason why and proceed with request.) (Agent: If yes, when and what did the pharmacy advise?)  Is this the correct pharmacy for this prescription? Yes If no, delete pharmacy and type the correct one.  This is the patient's preferred pharmacy:  [facility] [address] [place] - [address] [place] [address] Phone: [phone] Fax: [phone]   Has the prescription been filled recently? Yes  Is the patient out of the medication? Yes  Has the patient been seen for an appointment in the last year OR does the patient have an upcoming appointment? Yes  Can we respond through MyChart? Yes  Agent: Please be advised that Rx refills may take up to 3 business days. We ask that you follow-up with your pharmacy.

## 2023-10-14 ENCOUNTER — Encounter: Payer: Self-pay | Admitting: Family Medicine

## 2023-10-14 LAB — URINE CULTURE
MICRO NUMBER:: 16192016
SPECIMEN QUALITY:: ADEQUATE

## 2023-11-08 ENCOUNTER — Ambulatory Visit (INDEPENDENT_AMBULATORY_CARE_PROVIDER_SITE_OTHER): Payer: Medicare Other | Admitting: Gastroenterology

## 2023-11-08 ENCOUNTER — Encounter: Payer: Self-pay | Admitting: Gastroenterology

## 2023-11-08 VITALS — BP 112/78 | HR 76 | Ht 62.0 in | Wt 202.2 lb

## 2023-11-08 DIAGNOSIS — K581 Irritable bowel syndrome with constipation: Secondary | ICD-10-CM | POA: Diagnosis not present

## 2023-11-08 NOTE — Progress Notes (Signed)
 Chief Complaint: LLQ pain and constipation Primary GI MD: Dr. Marina Goodell  HPI: 68 year old female who is a patient of Dr. Lamar Sprinkles.  She has past medical history of coronary artery disease (on Plavix), hypertension, hyperlipidemia, chronic kidney disease stage III, GERD, hiatal hernia/stricture status post previous dilation, history of colon polyps, chronic constipation, history of ischemic colitis presents for left sided pain.  Seen by Doug Sou, [name] 04/2023 for repeat colonoscopy and chronic constipation. Recommended to try citracel and miralax. She was also having solid food dysphagia. She underwent EGD/Colonoscopy as below which showed stricture s/p dilation and small tubular adenoma with recall of 7 years.  Discussed the use of AI scribe software for clinical note transcription with the patient, who gave verbal consent to proceed.  History of Present Illness She experiences persistent pain in her lower left abdomen, which has been ongoing for several months. The pain is constant, worsens at night, and is particularly aggravated when lying on her side. Initially, she sought treatment for a urinary tract infection and was prescribed antibiotics, which did not resolves her pain.  She has significant constipation, with bowel movements occurring as infrequently as once a week. She has tried Citrucel and Miralax, but these treatments were ineffective and sometimes resulted in diarrhea. Due to these side effects, she discontinued the medications. Despite being a regular water drinker and walking a few miles daily, she is frustrated by her inability to have regular bowel movements.  Her past medical history includes a recent colonoscopy, which did not reveal any concerning findings. Her thyroid function has been checked in the past and was normal.    PREVIOUS GI WORKUP   EGD 06/2023 - Benign- appearing esophageal stenosis. Dilated (54Fr).  - Normal stomach.  - Normal examined  duodenum.  Colonoscopy 06/2023 - One 2 mm polyp (tubular adenoma) in the rectum, removed with a cold snare. Resected and retrieved.  - Diverticulosis in the sigmoid colon.  - The examination was otherwise normal on direct and retroflexion views. - repeat 7 years  Last colonoscopy was August 2019 at which time she had 2 polyps that were 1 to 3 mm in size were consistent with adenomas. She does have a family history of colon cancer in her brother.   Last EGD August 2019 with a benign-appearing esophageal stenosis that was dilated.   Past Medical History:  Diagnosis Date   Angina effort 05/22/2013   Anxiety    CAD (coronary artery disease), possible SCAD 12/14/2012   Colon polyps    Duodenitis    Dyspnea on exertion    LEXISCAN, 09/30/2008 - normal, EKG negative for ischemia, no ECG changes   Esophageal stricture    Gastritis    GERD (gastroesophageal reflux disease)    Gout    "only from RX given after MI" (01/22/2013)   H/O hiatal hernia    History of esophageal stricture    Hypertension    2D ECHO, 09/30/2008 - EF >55%, normal: RENAL DOPPLER, 03/21/2007 - normal duplex study   Hypothyroidism    IBS (irritable bowel syndrome)    Ischemic colitis (HCC)    Lower GI bleeding    10/07/11 "I've had 3 episodes in the past year"   Myocardial infarction [facility]) 12/10/2012   "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)   Tubular adenoma polyp of rectum 2008    Past Surgical History:  Procedure Laterality Date   ABDOMINAL HYSTERECTOMY  1999   ANTERIOR LUMBAR FUSION  2010   "L4-5" (01/22/2013)   CARDIAC  CATHETERIZATION N/A 02/06/2015   Procedure: Left Heart Cath and Coronary Angiography;  Surgeon: Marykay Lex, MD;  Location[facility] INVASIVE CV LAB;  Service: Cardiovascular;  Laterality: N/A;   ESOPHAGEAL DILATION  08/2010   ESOPHAGEAL DILATION     "once or twice" (01/22/2013)   HAMMER TOE SURGERY Bilateral ~ 2002   LEFT HEART CATHETERIZATION WITH CORONARY ANGIOGRAM N/A 12/11/2012    Procedure: LEFT HEART CATHETERIZATION WITH CORONARY ANGIOGRAM;  Surgeon: Marykay Lex, MD;  Location: [facility] CATH LAB;  Service: Cardiovascular;  Laterality: N/A;   LEFT HEART CATHETERIZATION WITH CORONARY ANGIOGRAM N/A 12/15/2012   Procedure: LEFT HEART CATHETERIZATION WITH CORONARY ANGIOGRAM;  Surgeon: Marykay Lex, MD;  Location: [facility] CATH LAB;  Service: Cardiovascular;  Laterality: N/A;   PERIPHERAL VENOUS STUDY  10/03/2008   No evidence of DVT, superficial thrombosis, or Baker's cyst   TONSILLECTOMY AND ADENOIDECTOMY  1960's   TUBAL LIGATION  1985    Current Outpatient Medications  Medication Sig Dispense Refill   allopurinol (ZYLOPRIM) 100 MG tablet Take 3 tablets (300 mg total) by mouth daily. 270 tablet 3   ALPRAZolam (XANAX) 0.5 MG tablet TAKE ONE TABLET BY MOUTH THREE TIMES DAILY AS NEEDED FOR ANXIETY 90 tablet 0   amLODipine (NORVASC) 5 MG tablet TAKE ONE TABLET BY MOUTH ONCE A DAY 90 tablet 2   amoxicillin-clavulanate (AUGMENTIN) 875-125 MG tablet Take 1 tablet by mouth 2 (two) times daily. 20 tablet 0   aspirin EC 81 MG tablet Take 81 mg by mouth daily. Swallow whole.     clopidogrel (PLAVIX) 75 MG tablet TAKE ONE TABLET BY MOUTH ONE TIME DAILY WITH BREAKFAST 90 tablet 2   dicyclomine (BENTYL) 10 MG capsule Take 2 capsules (20 mg total) by mouth 4 (four) times daily -  before meals and at bedtime. (Patient taking differently: Take 20 mg by mouth as needed for spasms.) 240 capsule 0   Evolocumab (REPATHA SURECLICK) 140 MG/ML SOAJ Inject 140 MG into the skin once every 14 days 2 mL 11   ezetimibe (ZETIA) 10 MG tablet TAKE ONE TABLET BY MOUTH ONCE A DAY 90 tablet 3   lisinopril (ZESTRIL) 5 MG tablet TAKE ONE TABLET BY MOUTH ONE TIME DAILY 90 tablet 3   metoprolol tartrate (LOPRESSOR) 25 MG tablet Take 1 tablet (25 mg total) by mouth 2 (two) times daily. 180 tablet 3   nitroGLYCERIN (NITROSTAT) 0.4 MG SL tablet DISSOLVE 1 TABLET UNDER THE TONGUE EVERY 5 MINUTES AS NEEDED FOR CHEST PAIN  25 tablet 3   ondansetron (ZOFRAN) 4 MG tablet Take 1 tablet (4 mg total) by mouth every 8 (eight) hours as needed for nausea or vomiting. 30 tablet 1   pantoprazole (PROTONIX) 40 MG tablet TAKE ONE TABLET BY MOUTH ONCE A DAY 90 tablet 3   predniSONE (DELTASONE) 20 MG tablet Take one by mouth daily for 3-5 days as needed for gout flare 30 tablet 1   ranolazine (RANEXA) 1000 MG SR tablet TAKE ONE TABLET BY MOUTH TWICE DAILY 180 tablet 0   Semaglutide-Weight Management (WEGOVY) 2.4 MG/0.75ML SOAJ Inject 2.4 mg into the skin once a week. 9 mL 2   No current facility-administered medications for this visit.    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   Social Drivers of Corporate investment banker Strain: Low Risk  (07/13/2023)   Overall Financial Resource Strain (CARDIA)    Difficulty of Paying Living Expenses: Not hard at all  Food Insecurity: No Food Insecurity (07/13/2023)   Hunger Vital Sign    Worried About Running Out of Food in the Last Year: Never true    Ran Out of Food in the Last Year: Never true  Transportation Needs: No Transportation Needs (07/13/2023)   PRAPARE - Scientist, research (physical sciences) (Medical): No    Lack of Transportation (Non-Medical): No  Physical Activity: Sufficiently Active (07/13/2023)   Exercise Vital Sign    Days of Exercise per Week: 5 days    Minutes of Exercise per Session: 60 min  Stress: No Stress Concern Present (07/13/2023)   [employer] of Occupational Health - Occupational Stress Questionnaire    Feeling of Stress : Not at all  Social Connections: Moderately Integrated (07/13/2023)   Social Connection and Isolation Panel [NHANES]    Frequency of Communication with Friends and Family: More than three times a week    Frequency of Social Gatherings with Friends and Family: More than three times a week    Attends Religious Services: More than 4 times per year    Active Member of [employer] or Organizations: Yes    Attends Engineer, structural: More than 4 times per year    Marital Status: Divorced  Intimate Partner Violence: Not At Risk (07/13/2023)   Humiliation, Afraid, Rape, and Kick questionnaire    Fear of Current or Ex-Partner: No    Emotionally Abused: No    Physically Abused: No    Sexually Abused: No    Review of Systems:    Constitutional: No weight loss, fever, chills, weakness or fatigue HEENT: Eyes: No change in vision               Ears, Nose, Throat:  No change in hearing or congestion Skin: No rash or itching Cardiovascular: No chest pain, chest pressure or palpitations   Respiratory: No SOB or cough Gastrointestinal: See HPI and otherwise negative Genitourinary: No dysuria or change in urinary frequency Neurological: No headache, dizziness or syncope Musculoskeletal: No new muscle or joint pain Hematologic: No bleeding or bruising Psychiatric: No history of depression or anxiety    Physical Exam:  Vital signs: BP 112/78   Pulse 76   Ht 5\' 2"  (1.575 m)   Wt 202 lb 3.2 oz (91.7 kg)   BMI 36.98 kg/m   Constitutional: NAD, Well developed, Well nourished, alert and cooperative Head:   Normocephalic and atraumatic. Eyes:   PEERL, EOMI. No icterus. Conjunctiva pink. Respiratory: Respirations even and unlabored. Lungs clear to auscultation bilaterally.  No wheezes, crackles, or rhonchi.  Cardiovascular:  Regular rate and rhythm. No peripheral edema, cyanosis or pallor.  Gastrointestinal:  Soft, nondistended, nontender. No rebound or guarding. Hypoactive bowel sounds. No appreciable masses or hepatomegaly. Rectal:  Not performed.  Msk:  Symmetrical without gross deformities. Without edema, no deformity or joint abnormality.  Neurologic:  Alert and  oriented x4;  grossly normal neurologically.  Skin:   Dry and intact without significant lesions or rashes. Psychiatric: Oriented to person, place and time. Demonstrates good judgement and reason without abnormal affect or behaviors.   RELEVANT LABS AND IMAGING: CBC    Component Value Date/Time   WBC 8.3 10/12/2023 0953   RBC 4.19 10/12/2023 0953   HGB 12.9 10/12/2023 0953   HCT 39.3 10/12/2023 0953   PLT 320.0 10/12/2023 0953   MCV 93.7 10/12/2023 0953   MCH 30.4 04/20/2021 0304   MCHC 33.0 10/12/2023 0953   RDW 13.6 10/12/2023 0953   LYMPHSABS 1.5 04/17/2021 0356   MONOABS 0.9 04/17/2021 0356   EOSABS 0.0 04/17/2021 0356   BASOSABS 0.0 04/17/2021 0356    CMP     Component Value Date/Time   NA 137 10/12/2023 0953   NA 138 09/12/2023 0000   K 4.3 10/12/2023 0953   CL 100 10/12/2023 0953   CO2 28 10/12/2023 0953   GLUCOSE 97 10/12/2023 0953   BUN 19 10/12/2023 0953   BUN 21 09/12/2023 0000   CREATININE 1.32 (H) 10/12/2023 0953   CREATININE 0.90 04/15/2014 0853   CALCIUM 9.8 10/12/2023 0953   PROT 6.8 10/12/2023 0953   PROT 6.7 10/02/2019 0850   ALBUMIN 3.9 10/12/2023 0953   ALBUMIN 4.0 10/02/2019 0850   AST 11 10/12/2023 0953   ALT 10 10/12/2023 0953   ALKPHOS 98 10/12/2023 0953   BILITOT 0.6 10/12/2023 0953   BILITOT 0.2 10/02/2019 0850   GFRNONAA 53 (L) 04/20/2021 0304   GFRNONAA 71 04/15/2014 0853    GFRAA >60 02/07/2015 0348   GFRAA 82 04/15/2014 0853     Assessment/Plan:   Assessment & Plan  IBS-C Chronic constipation with lower left abdominal pain. Previous treatments including citracel and miralax were ineffective. Reginal Lutes possibly exacerbating symptoms although she has been on this medication for quite some time.  Up-to-date on her colonoscopy.  Normal thyroid. - provided samples of Linzess 72 mcg - Provide IBgard samples for pain management, up to four times daily. - Advise use of squatty potty to aid bowel movements. -Continue to increase water, increase fiber, increase exercise - Schedule follow-up call next Wednesday to assess treatment response. - Set follow-up appointment in 8-12 weeks   This visit required 32 minutes of patient care (this includes precharting, chart review, review of results, face-to-face time used for counseling as well as treatment plan and follow-up. The patient was provided an opportunity to ask questions and all were answered. The patient agreed with the plan and demonstrated an understanding of the instructions.   Lara Mulch [facility] 11/08/2023, 3:28 PM  Cc: Pearline Cables, MD

## 2023-11-08 NOTE — Progress Notes (Signed)
 Noted.

## 2023-11-08 NOTE — Patient Instructions (Addendum)
 We have given you samples of the following medication to take: iBgard use as directed and Linzess  Linzess works best when taken once a day every day, on an empty stomach, at least 30 minutes before your first meal of the day.  When Linzess is taken daily as directed:  *Constipation relief is typically felt in about a week *IBS-C patients may begin to experience relief from belly pain and overall abdominal symptoms (pain, discomfort, and bloating) in about 1 week,   with symptoms typically improving over 12 weeks.  Diarrhea may occur in the first 2 weeks -keep taking it.  The diarrhea should go away and you should start having normal, complete, full bowel movements. It may be helpful to start treatment when you can be near the comfort of your own bathroom, such as a weekend.  _______________________________________________________  If your blood pressure at your visit was 140/90 or greater, please contact your primary care physician to follow up on this.  _______________________________________________________  If you are age 77 or older, your body mass index should be between 23-30. Your Body mass index is 36.98 kg/m. If this is out of the aforementioned range listed, please consider follow up with your Primary Care Provider.  If you are age 43 or younger, your body mass index should be between 19-25. Your Body mass index is 36.98 kg/m. If this is out of the aformentioned range listed, please consider follow up with your Primary Care Provider.   ________________________________________________________  The [facility] providers would like to encourage you to use [facility] to communicate with providers for non-urgent requests or questions.  Due to long hold times on the telephone, sending your provider a message by [facility] may be a faster and more efficient way to get a response.  Please allow 48 business hours for a response.  Please remember that this is for non-urgent requests.   _______________________________________________________

## 2023-11-14 ENCOUNTER — Telehealth: Payer: Self-pay | Admitting: Family Medicine

## 2023-11-14 ENCOUNTER — Telehealth: Payer: Self-pay

## 2023-11-14 NOTE — Telephone Encounter (Signed)
error 

## 2023-11-14 NOTE — Telephone Encounter (Signed)
 Patient called and she says the question is about wegovy. She says when she takes wegovy on Friday's, she gets sick, vomiting 1-2 times, for about a day, then a day or two passes and the vomiting comes back, which is relieved with the zofran prescribed for her to take. She says she didn't take wegovy this past Friday because on this Friday coming up she will be going on vacation and doesn't want to be sick. She asks is it ok for her not to take it. She says she's taking it only for weight loss. She says she knows Dr. Geralyn Knee is out of the office until the 16th, so she's ok waiting until she returns to give the advice on what to do. Advised I will send this to the office and someone will call back with the recommendation from Dr. Geralyn Knee.  Copied from CRM [id number]. Topic: Clinical - Medication Question >> Nov 14, 2023 12:20 PM Dyann Glaser G wrote: Reason for CRM: THE PATIENT WOULD LIKE A CALL FROM THE NURSE, SHE HAS QUESTIONS ABOUT HER MEDICATIONS. THANKS

## 2023-11-15 ENCOUNTER — Telehealth: Payer: Self-pay | Admitting: Cardiovascular Disease

## 2023-11-15 NOTE — Telephone Encounter (Signed)
 Pt c/o medication issue:  1. Name of Medication:   amLODipine (NORVASC) 5 MG tablet    2. How are you currently taking this medication (dosage and times per day)? As written  3. Are you having a reaction (difficulty breathing--STAT)? no  4. What is your medication issue? Pt wants to know if this is the med that doctor told her she could take an extra one if her heart felt off beat.

## 2023-11-15 NOTE — Telephone Encounter (Signed)
 Attempted to call patient, no answer left message requesting a call back.

## 2023-11-15 NOTE — Telephone Encounter (Signed)
 Pt says wegovy is making her sick, zofran does not help. Pt okay with waiting until you return to hear from you for advise.

## 2023-11-16 ENCOUNTER — Encounter: Payer: Self-pay | Admitting: Family Medicine

## 2023-11-16 NOTE — Telephone Encounter (Signed)
 2nd attempt to call patient, no answer left message requesting a call back.

## 2023-11-16 NOTE — Telephone Encounter (Signed)
 Pt retuning call, requesting cb

## 2023-11-16 NOTE — Telephone Encounter (Signed)
 Attempted to call patient, no answer left message requesting a call back.

## 2023-11-17 NOTE — Telephone Encounter (Signed)
 The medication that they talked about was probably metoprolol.  Can take an extra dose for palpitations.  If this is still an issue when she gets back from vacation, would probably beneficial for her to be seen there either by Dr. Katheryne Pane or an APP.  DH

## 2023-11-17 NOTE — Telephone Encounter (Signed)
 Patient identification verified by 2 forms. Sabrina Lucky, RN    Called and spoke to patient  Patient states:    -years ago she was told she could take an extra dose of a medication to help with heart rate   -last week during her walk she noticed her heart was beating differently   -symptoms lasted for a few days after her walk   -it felt like she was having palpitations   -does not know what heart rate was during those days   -it felt like her heart was racing   -symptoms resolved on their own   -experienced these symptoms years ago   -she is going on vacation tomorrow for 1 week   -would like to know which medication she can take if this happens on her vacation  Patient denies:   -SOB/difficulty breathing  Informed patient message sent to provider for assistance  Patient has no further questions at this time

## 2023-11-22 NOTE — Telephone Encounter (Signed)
 Attempted to call patient, no answer left detailed message reviewing provider message below

## 2023-11-29 ENCOUNTER — Telehealth: Payer: Self-pay | Admitting: Gastroenterology

## 2023-11-29 NOTE — Telephone Encounter (Signed)
 Called patient to again to see how she was doing on the Linzess 72 mcg.  Patient did not answer.  Left voicemail to call us  back if she has any questions concerns or wants to give us  an update on her symptoms.  Otherwise continue follow-up as planned in June.

## 2023-12-05 ENCOUNTER — Other Ambulatory Visit: Payer: Self-pay | Admitting: Family Medicine

## 2023-12-05 DIAGNOSIS — F411 Generalized anxiety disorder: Secondary | ICD-10-CM

## 2023-12-14 ENCOUNTER — Other Ambulatory Visit: Payer: Self-pay | Admitting: Internal Medicine

## 2024-01-05 ENCOUNTER — Other Ambulatory Visit: Payer: Self-pay | Admitting: Cardiovascular Disease

## 2024-01-12 ENCOUNTER — Encounter: Payer: Self-pay | Admitting: Physician Assistant

## 2024-01-12 ENCOUNTER — Ambulatory Visit: Payer: Medicare Other | Attending: Physician Assistant | Admitting: Physician Assistant

## 2024-01-12 VITALS — BP 112/78 | HR 68 | Ht 64.0 in | Wt 194.2 lb

## 2024-01-12 DIAGNOSIS — E782 Mixed hyperlipidemia: Secondary | ICD-10-CM | POA: Diagnosis not present

## 2024-01-12 DIAGNOSIS — I10 Essential (primary) hypertension: Secondary | ICD-10-CM | POA: Diagnosis not present

## 2024-01-12 DIAGNOSIS — I2510 Atherosclerotic heart disease of native coronary artery without angina pectoris: Secondary | ICD-10-CM | POA: Insufficient documentation

## 2024-01-12 DIAGNOSIS — I214 Non-ST elevation (NSTEMI) myocardial infarction: Secondary | ICD-10-CM

## 2024-01-12 MED ORDER — CLOPIDOGREL BISULFATE 75 MG PO TABS: 75.0000 mg | ORAL_TABLET | Freq: Every day | ORAL | 3 refills | Status: AC

## 2024-01-12 MED ORDER — RANOLAZINE ER 1000 MG PO TB12: 1000.0000 mg | ORAL_TABLET | Freq: Two times a day (BID) | ORAL | 3 refills | Status: AC

## 2024-01-12 MED ORDER — NITROGLYCERIN 0.4 MG SL SUBL: 0.4000 mg | SUBLINGUAL_TABLET | SUBLINGUAL | 3 refills | Status: AC | PRN

## 2024-01-12 MED ORDER — AMLODIPINE BESYLATE 5 MG PO TABS: 5.0000 mg | ORAL_TABLET | Freq: Every day | ORAL | 3 refills | Status: AC

## 2024-01-12 MED ORDER — REPATHA SURECLICK 140 MG/ML ~~LOC~~ SOAJ: 140.0000 mg | SUBCUTANEOUS | 4 refills | Status: DC

## 2024-01-12 NOTE — Patient Instructions (Signed)
 Medication Instructions:  NO CHANGES *If you need a refill on your cardiac medications before your next appointment, please call your pharmacy*  Lab Work: NO LABS If you have labs (blood work) drawn today and your tests are completely normal, you will receive your results only by: MyChart Message (if you have MyChart) OR A paper copy in the mail If you have any lab test that is abnormal or we need to change your treatment, we will call you to review the results.  Testing/Procedures: NO TESTING  Follow-Up: At [facility], you and your health needs are our priority.  As part of our continuing mission to provide you with exceptional heart care, our providers are all part of one team.  This team includes your primary Cardiologist (physician) and Advanced Practice Providers or APPs (Physician Assistants and Nurse Practitioners) who all work together to provide you with the care you need, when you need it.  Your next appointment:   6 month(s)  Provider:   Lauro Portal, MD

## 2024-01-12 NOTE — Progress Notes (Signed)
 Cardiology Office Note   Date:  01/13/2024  ID:  [name], DOB 1956-06-13, MRN [id number] PCP: Kaylee Partridge, MD  [facility] Providers Cardiologist:  Lauro Portal, MD     History of Present Illness [name] is a 68 y.o. female with a hx of HTN, HLD with statin intolerance, obesity, hypothyroidism and CAD. Her initial cardiac catheterization in 12/10/2012 performed by Dr. Addie Holstein demonstrated diffuse LAD disease with essentially subtotal occlusion that had the appearance of possible spontaneous coronary artery dissection, however with existing CAD, single vessel diffuse disease was also possible. It was decided not to pursue any intervention at the time but to wait and have patient returned several days later for relook cath to reevaluate the LAD. She returned to the cath lab on 12/15/2012 for relook study, she was noted to have progression of LAD disease to total occlusion at the original 95% subtotal location with improved D1 to diagonal LAD and right to left collaterals. The image was reviewed with the interventional team and concluded the best course of option was not proceed with extensive LAD PTCA-PCI in the absence of ongoing symptom. Medical therapy was recommended. Last cardiac catheterization performed on 02/06/2015 showed total occlusion of entire mid to distal LAD, with faint collaterals from left to left and right-to-left, 90% ostial first septal lesion, 35% mid RCA, 45% distal RCA, 60% OM lesion. Medical therapy was recommended.  Echocardiogram obtained on 10/10/2019 showed EF 50 to 55%, moderate asymmetric LVH, severe hypokinesis in the mid anteroseptal wall consistent with LAD territory, normal RVEF, mild to moderate MR. Echocardiogram performed in March 2022 showed normal EF, moderate LVH, no significant valve issue. Due to the dizziness, heart monitor was performed, this revealed sinus rhythm, occasional PACs and PVCs, short run of SVT.  Nothing to explain  her episodic falls.  She was admitted in September 2022 with symptomatic acute GI bleed with questionable infectious colitis that was treated with Augmentin .  She has not had any GI bleeding issue since.  She is being followed by Dr. Maximo Spar for management of hyperlipidemia.  She is on combination of Zetia  and Repatha  which is controlling her LDL very well.  Myoview  in June 2024 was low risk.  Patient presents today for follow-up.  She denies any chest pain or shortness of breath.  She just returned from a trip to [place] with her grandkids.  She has been able to walk more than 12,000 steps while at [facility].  She has no lower extremity edema, orthopnea or PND.  Instead of taking metoprolol  tartrate 25 mg twice a day, she has been taking it once a day.  She is aware that she can take extra dose of metoprolol  as needed if she has palpitation.  She did have more palpitation in April, however has not recurred in the past 2 months.  I will hold off on heart monitor at this time.  She can follow-up with Dr. Katheryne Pane in 6 months.  ROS:   She denies chest pain, palpitations, dyspnea, pnd, orthopnea, n, v, dizziness, syncope, edema, weight gain, or early satiety. All other systems reviewed and are otherwise negative except as noted above.    Studies Reviewed EKG Interpretation Date/Time:  Thursday January 12 2024 13:15:17 EDT Ventricular Rate:  68 PR Interval:  184 QRS Duration:  102 QT Interval:  400 QTC Calculation: 425 R Axis:   -33  Text Interpretation: Normal sinus rhythm Left axis deviation Moderate voltage criteria for LVH, may be  normal variant ( R in aVL , Cornell product ) When compared with ECG of 13-Jul-2023 11:29, No significant change was found Confirmed by Boston Catarino [phone]) on 01/13/2024 9:50:36 AM    Cardiac Studies & Procedures   ______________________________________________________________________________________________ CARDIAC CATHETERIZATION  CARDIAC CATHETERIZATION  02/06/2015  Conclusion Images from the original result were not included. 1. Stable multivessel disease with diffuse eccentric stenosis in the circumflex-OM 2 , diffuse mild-to-moderate disease in the RCA , as well as subtotal/total occlusion of the entire mid to distal LAD -- with faint collaterals from left the left and right to left, along with 2. The left ventricular systolic function is essentially normal with normal LVEDP 3. Mid LAD to Dist LAD lesion, 100% stenosed - similar in appearance to 11/2012 (but now essentially CTO). Ost 1st Sept lesion, 90% stenosed. 4. Mid RCA lesion, 35% stenosed. Dist RCA lesion, 45% stenosed. RPDA lesion, 40% stenosed. 5. Ost 3rd Mrg to 3rd Mrg lesion, 60% stenosed (stable to improved from 2014). Tiny Ost 2nd Mrg , 90% stenosed. 6.  There is no obvious culprit lesion for the patient's some unstable angina. She indeed does have diffuse disease in the LAD but no significant change from before. The dyspnea no see in the distal circumflex-OM 2 as well as RCA appeared to be if anything improved from her last catheterization. She does have several focal stenoses and ostial parts of small branches including a small OM 2 and septal perforator.   Recommendations:  Standard post radial cath TR band removal.  Transfer to telemetry for titration of antianginal medications. Is leery of discharge today without some adjustment of her medications which make sense.  Discontinue IV heparin . Would likely titrate up Imdur  and potentially adjust antihypertensives agents pending her blood pressure evaluation post cath.  Would expect discharge tomorrow once stable.   Arleen Lacer, M.D., M.S. Interventional Cardiologist  Pager # [phone]  Findings Coronary Findings Diagnostic  Dominance: Right  Left Main The vessel was injected is large . TIG 4.0 Catheter used.  Left Anterior Descending The vessel is normal in caliber . There is severe diffuse disease  throughout the vessel. from just after D1 takeoff, the vessel appears to be small  & tapers to occlusion in the mid vessel     The vessel was previously described as appearing like a spontaneous coronary dissection.  It looks almost identical to 2014. diffuse chronic total occlusion and has bridging right-to-left left-to-left collateral flow.  First Diagonal Branch The vessel is moderate in size.  First Septal Branch The vessel is moderate in size. discrete located at the major branch .  Ramus Intermedius The vessel is small .  Left Circumflex The vessel is large .  First Obtuse Marginal Branch The vessel is moderate in size.  Second Obtuse Marginal Branch The vessel is small in size. discrete located at the major branch .   Too Small to consider PCI  Third Obtuse Marginal Branch The vessel is large in size. diffuse eccentric .   Stable to improved from 11/2012 (was called ~70% at that time)  Lateral Third Obtuse Marginal Branch The vessel is small in size.  Right Coronary Artery The vessel was injected is normal in caliber . TIG 4.0 Catheter used.   The vessel exhibits minimal luminal irregularities. tubular . discrete located at the bend .  Acute Marginal Branch The vessel is small in size.  Right Posterior Descending Artery The vessel is normal in size and exhibits minimal luminal irregularities. Essentially identical  to 2014 discrete .  Right Posterior Atrioventricular Artery The vessel is small in size.  Intervention  No interventions have been documented.   STRESS TESTS  MYOCARDIAL PERFUSION IMAGING 01/20/2023  Narrative   Findings are consistent with no ischemia. The study is low risk.   No ST deviation was noted.   LV perfusion is abnormal. Defect 1: There is a small defect with moderate reduction in uptake present in the apical to mid septal and apex location(s) that is fixed. Viability is absent. There is normal wall motion in the defect area.  Consistent with infarction and artifact.   Left ventricular function is normal. Nuclear stress EF: 62 %. The left ventricular ejection fraction is normal (55-65%). End diastolic cavity size is normal. End systolic cavity size is normal.   Prior study available for comparison from 12/28/2012. Anteroapical scar without ischemia, LVEF 55%, low risk  Small size, moderate intensity mostly fixed (SDS 1) apical and apical septal perfusion defect, suggestive of scar, or less likely attenuation artifact. No reversible ischemia. LVEF 62% with normal wall motion. This is a low risk study. Compared to a prior study in 2014, the LVEF is higher, but the perfusion defect appears similar.   ECHOCARDIOGRAM  ECHOCARDIOGRAM COMPLETE 10/13/2020  Narrative ECHOCARDIOGRAM REPORT    Patient Name:   [name] Date of Exam: 10/13/2020 Medical Rec #:  [id number]           Height:       63.0 in Accession #:    [id number]          Weight:       220.4 lb Date of Birth:  May 03, 1956           BSA:          2.015 m Patient Age:    64 years            BP:           118/71 mmHg Patient Gender: F                   HR:           66 bpm. Exam Location:  [address]  Procedure: 2D Echo, Cardiac Doppler and Color Doppler  Indications:    I05.9 Mitral Valve Disorder  History:        Patient has prior history of Echocardiogram examinations, most recent 10/10/2019. Risk Factors:Hypertension. Myocardial infarction. Coronary artery disease. Hypothyroidism.  Sonographer:    Cheri Coria Rodgers-Jones RDCS Referring Phys: [id number] JONATHAN J BERRY  IMPRESSIONS   1. Left ventricular ejection fraction, by estimation, is 55 to 60%. The left ventricle has normal function. The left ventricle demonstrates regional wall motion abnormalities (see scoring diagram/findings for description). There is hypokinesis of the mid anteroseptal wall most notable on the short axis view. There is moderate concentric left ventricular hypertrophy.  Left ventricular diastolic parameters are consistent with Grade I diastolic dysfunction (impaired relaxation). 2. Right ventricular systolic function is normal. The right ventricular size is normal. There is normal pulmonary artery systolic pressure. The estimated right ventricular systolic pressure is 25.3 mmHg. 3. Left atrial size was mildly dilated. 4. The mitral valve is grossly normal. Mild mitral valve regurgitation. 5. The aortic valve is tricuspid. There is mild calcification of the aortic valve. There is mild thickening of the aortic valve. Aortic valve regurgitation is not visualized. Mild to moderate aortic valve sclerosis/calcification is present, without any evidence of aortic stenosis. 6. Aortic dilatation noted. There  is mild dilatation of the ascending aorta, measuring 36 mm. 7. The inferior vena cava is normal in size with greater than 50% respiratory variability, suggesting right atrial pressure of 3 mmHg.  Comparison(s): Compared to prior study in 10/2019, the LVEF appears normal 55-60% and the MR appears mild.  FINDINGS Left Ventricle: Left ventricular ejection fraction, by estimation, is 55 to 60%. The left ventricle has normal function. The left ventricle demonstrates regional wall motion abnormalities. There is hypokinesis of the mid anteroseptal wall most notable on the short axis view. The left ventricular internal cavity size was normal in size. There is moderate concentric left ventricular hypertrophy. Left ventricular diastolic parameters are consistent with Grade I diastolic dysfunction (impaired relaxation).  Right Ventricle: The right ventricular size is normal. No increase in right ventricular wall thickness. Right ventricular systolic function is normal. There is normal pulmonary artery systolic pressure. The tricuspid regurgitant velocity is 2.36 m/s, and with an assumed right atrial pressure of 3 mmHg, the estimated right ventricular systolic pressure is 25.3  mmHg.  Left Atrium: Left atrial size was mildly dilated.  Right Atrium: Right atrial size was normal in size.  Pericardium: There is no evidence of pericardial effusion.  Mitral Valve: The mitral valve is grossly normal. There is mild thickening of the mitral valve leaflet(s). Mild mitral annular calcification. Mild mitral valve regurgitation.  Tricuspid Valve: The tricuspid valve is normal in structure. Tricuspid valve regurgitation is trivial.  Aortic Valve: The aortic valve is tricuspid. There is mild calcification of the aortic valve. There is mild thickening of the aortic valve. Aortic valve regurgitation is not visualized. Mild to moderate aortic valve sclerosis/calcification is present, without any evidence of aortic stenosis.  Pulmonic Valve: The pulmonic valve was normal in structure. Pulmonic valve regurgitation is trivial.  Aorta: Aortic dilatation noted. There is mild dilatation of the ascending aorta, measuring 36 mm.  Venous: The inferior vena cava is normal in size with greater than 50% respiratory variability, suggesting right atrial pressure of 3 mmHg.  IAS/Shunts: No atrial level shunt detected by color flow Doppler.   LEFT VENTRICLE PLAX 2D LVIDd:         4.60 cm  Diastology LVIDs:         2.70 cm  LV e' medial:    6.53 cm/s LV PW:         1.00 cm  LV E/e' medial:  11.7 LV IVS:        0.90 cm  LV e' lateral:   10.30 cm/s LVOT diam:     2.00 cm  LV E/e' lateral: 7.4 LV SV:         58 LV SV Index:   29 LVOT Area:     3.14 cm   RIGHT VENTRICLE RV Basal diam:  2.80 cm RV S prime:     15.50 cm/s  LEFT ATRIUM             Index       RIGHT ATRIUM          Index LA diam:        4.60 cm 2.28 cm/m  RA Area:     9.28 cm LA Vol (A2C):   30.5 ml 15.14 ml/m RA Volume:   18.60 ml 9.23 ml/m LA Vol (A4C):   39.4 ml 19.55 ml/m LA Biplane Vol: 37.6 ml 18.66 ml/m AORTIC VALVE LVOT Vmax:   85.00 cm/s LVOT Vmean:  61.700 cm/s LVOT VTI:    0.184 m  AORTA Ao Root  diam: 3.10 cm Ao Asc diam:  3.60 cm  MITRAL VALVE               TRICUSPID VALVE MV Area (PHT): 2.95 cm    TR Peak grad:   22.3 mmHg MV Decel Time: 257 msec    TR Vmax:        236.00 cm/s MV E velocity: 76.60 cm/s MV A velocity: 87.00 cm/s  SHUNTS MV E/A ratio:  0.88        Systemic VTI:  0.18 m Systemic Diam: 2.00 cm  Riccardo Chamberlain MD Electronically signed by Riccardo Chamberlain MD Signature Date/Time: 10/13/2020/2:05:34 PM    Final    MONITORS  LONG TERM MONITOR (3-14 DAYS) 11/21/2020  Narrative Patch Wear Time:  13 days and 9 hours (2022-04-02T11:05:43-0400 to 2022-04-15T20:32:29-0400)  Patient had a min HR of 53 bpm, max HR of 138 bpm, and avg HR of 69 bpm. Predominant underlying rhythm was Sinus Rhythm. 1 run of Supraventricular Tachycardia occurred lasting 5 beats with a max rate of 132 bpm (avg 116 bpm). Isolated SVEs were rare (<1.0%), SVE Couplets were rare (<1.0%), and SVE Triplets were rare (<1.0%). Isolated VEs were rare (<1.0%), VE Couplets were rare (<1.0%), and no VE Triplets were present. Ventricular Trigeminy was present.  1. SR/SB/ST 2. Occasional PACs and PVCs 3. Short runs of SVT       ______________________________________________________________________________________________      Risk Assessment/Calculations           Physical Exam VS:  BP 112/78 (BP Location: Left Arm, Patient Position: Sitting, Cuff Size: Large)   Pulse 68   Ht 5' 4 (1.626 m)   Wt 194 lb 3.2 oz (88.1 kg)   SpO2 98%   BMI 33.33 kg/m    Wt Readings from Last 3 Encounters:  01/12/24 194 lb 3.2 oz (88.1 kg)  11/08/23 202 lb 3.2 oz (91.7 kg)  10/12/23 202 lb 12.8 oz (92 kg)    GEN: Well nourished, well developed in no acute distress NECK: No JVD; No carotid bruits CARDIAC: RRR, no murmurs, rubs, gallops RESPIRATORY:  Clear to auscultation without rales, wheezing or rhonchi  ABDOMEN: Soft, non-tender, non-distended EXTREMITIES:  No edema; No deformity   ASSESSMENT  AND PLAN  CAD: Patient denies any chest pain.  Continue aspirin  and Plavix .  She recently came back from [place] and has been able to ambulate long distance without any exertional symptom.  Hypertension: Blood pressure well-controlled  Hyperlipidemia: Intolerant to statins.  Continue Zetia  and Repatha .       Dispo: Follow-up with Dr. Katheryne Pane in 6 months.  Signed, Ervin Heath, PA

## 2024-01-19 ENCOUNTER — Encounter: Payer: Self-pay | Admitting: Gastroenterology

## 2024-01-19 ENCOUNTER — Ambulatory Visit: Admitting: Gastroenterology

## 2024-01-19 VITALS — BP 122/80 | Ht 63.0 in | Wt 194.0 lb

## 2024-01-19 DIAGNOSIS — K581 Irritable bowel syndrome with constipation: Secondary | ICD-10-CM

## 2024-01-19 DIAGNOSIS — K219 Gastro-esophageal reflux disease without esophagitis: Secondary | ICD-10-CM

## 2024-01-19 MED ORDER — TRULANCE 3 MG PO TABS: 3.0000 mg | ORAL_TABLET | Freq: Every day | ORAL | 3 refills | Status: DC

## 2024-01-19 MED ORDER — DICYCLOMINE HCL 10 MG PO CAPS: 20.0000 mg | ORAL_CAPSULE | Freq: Three times a day (TID) | ORAL | 0 refills | Status: AC

## 2024-01-19 NOTE — Progress Notes (Signed)
 Noted

## 2024-01-19 NOTE — Patient Instructions (Signed)
 We have sent the following medications to your pharmacy for you to pick up at your convenience: Trulance 3 mg tablets, dicyclomine .  _______________________________________________________  If your blood pressure at your visit was 140/90 or greater, please contact your primary care physician to follow up on this.  _______________________________________________________  If you are age 68 or older, your body mass index should be between 23-30. Your Body mass index is 34.37 kg/m. If this is out of the aforementioned range listed, please consider follow up with your Primary Care Provider.  If you are age 94 or younger, your body mass index should be between 19-25. Your Body mass index is 34.37 kg/m. If this is out of the aformentioned range listed, please consider follow up with your Primary Care Provider.   ________________________________________________________  The [facility] providers would like to encourage you to use MYCHART to communicate with providers for non-urgent requests or questions.  Due to long hold times on the telephone, sending your provider a message by [facility] may be a faster and more efficient way to get a response.  Please allow 48 business hours for a response.  Please remember that this is for non-urgent requests.  _______________________________________________________   It was a pleasure to see you today!  Thank you for trusting me with your gastrointestinal care!

## 2024-01-19 NOTE — Progress Notes (Signed)
 Chief Complaint: Follow-up Primary GI MD: Dr. Elvin Hammer  HPI: Discussed the use of AI scribe software for clinical note transcription with the patient, who gave verbal consent to proceed.  History of Present Illness 68 year old female who is a patient of Dr. Alberta Almond.  She has past medical history of coronary artery disease (on Plavix ), hypertension, hyperlipidemia, chronic kidney disease stage III, GERD, hiatal hernia/stricture status post previous dilation, history of colon polyps, chronic constipation, history of ischemic colitis presents for follow-up  She experiences ongoing constipation, characterized by having one very small bowel movement daily. She has been using Citrucel, which provides some relief, but not sufficient. She previously tried Linzess, which resulted in severe diarrhea, leading to an unpleasant incident where she could not make it home in time. She has since been hesitant to use it regularly, trying it only once a week without significant improvement in bowel movements.  She experiences cramping, which she attributes to her constipation. Her dicyclomine  prescription expired in 2023, and she is experiencing more cramping now. Additionally, she has been experiencing heartburn, which she associates with her constipation. She is currently taking Protonix  and inquires about increasing the dosage to twice a day to help with the heartburn.     PREVIOUS GI WORKUP   EGD 06/2023 - Benign- appearing esophageal stenosis. Dilated (54Fr).  - Normal stomach.  - Normal examined duodenum.   Colonoscopy 06/2023 - One 2 mm polyp (tubular adenoma) in the rectum, removed with a cold snare. Resected and retrieved.  - Diverticulosis in the sigmoid colon.  - The examination was otherwise normal on direct and retroflexion views. - repeat 7 years   Last colonoscopy was August 2019 at which time she had 2 polyps that were 1 to 3 mm in size were consistent with adenomas. She does have a family  history of colon cancer in her brother.    Last EGD August 2019 with a benign-appearing esophageal stenosis that was dilated.   Past Medical History:  Diagnosis Date   Angina effort 05/22/2013   Anxiety    CAD (coronary artery disease), possible SCAD 12/14/2012   Colon polyps    Duodenitis    Dyspnea on exertion    LEXISCAN , 09/30/2008 - normal, EKG negative for ischemia, no ECG changes   Esophageal stricture    Gastritis    GERD (gastroesophageal reflux disease)    Gout    only from RX given after MI (01/22/2013)   H/O hiatal hernia    History of esophageal stricture    Hypertension    2D ECHO, 09/30/2008 - EF >55%, normal: RENAL DOPPLER, 03/21/2007 - normal duplex study   Hypothyroidism    IBS (irritable bowel syndrome)    Ischemic colitis (HCC)    Lower GI bleeding    10/07/11 I've had 3 episodes in the past year   Myocardial infarction (HCC) 12/10/2012   spontaneous coronary artery dissection (01/22/2013)   Tubular adenoma polyp of rectum 2008    Past Surgical History:  Procedure Laterality Date   ABDOMINAL HYSTERECTOMY  1999   ANTERIOR LUMBAR FUSION  2010   L4-5 (01/22/2013)   CARDIAC CATHETERIZATION N/A 02/06/2015   Procedure: Left Heart Cath and Coronary Angiography;  Surgeon: Arleen Lacer, MD;  Location: [facility] INVASIVE CV LAB;  Service: Cardiovascular;  Laterality: N/A;   ESOPHAGEAL DILATION  08/2010   ESOPHAGEAL DILATION     once or twice (01/22/2013)   HAMMER TOE SURGERY Bilateral ~ 2002   LEFT HEART CATHETERIZATION WITH CORONARY ANGIOGRAM  N/A 12/11/2012   Procedure: LEFT HEART CATHETERIZATION WITH CORONARY ANGIOGRAM;  Surgeon: Arleen Lacer, MD;  Location: [facility] CATH LAB;  Service: Cardiovascular;  Laterality: N/A;   LEFT HEART CATHETERIZATION WITH CORONARY ANGIOGRAM N/A 12/15/2012   Procedure: LEFT HEART CATHETERIZATION WITH CORONARY ANGIOGRAM;  Surgeon: Arleen Lacer, MD;  Location: [facility] CATH LAB;  Service: Cardiovascular;  Laterality: N/A;   PERIPHERAL VENOUS  STUDY  10/03/2008   No evidence of DVT, superficial thrombosis, or Baker's cyst   TONSILLECTOMY AND ADENOIDECTOMY  1960's   TUBAL LIGATION  1985    Current Outpatient Medications  Medication Sig Dispense Refill   allopurinol  (ZYLOPRIM ) 100 MG tablet Take 3 tablets (300 mg total) by mouth daily. (Patient taking differently: Take 300 mg by mouth as needed.) 270 tablet 3   ALPRAZolam  (XANAX ) 0.5 MG tablet TAKE ONE TABLET BY MOUTH THREE TIMES DAILY AS NEEDED FOR ANXIETY (Patient taking differently: Take 0.5 mg by mouth daily at 6 (six) AM. for anxiety) 90 tablet 0   amLODipine  (NORVASC ) 5 MG tablet Take 1 tablet (5 mg total) by mouth daily. 90 tablet 3   amoxicillin -clavulanate (AUGMENTIN ) 875-125 MG tablet Take 1 tablet by mouth 2 (two) times daily. 20 tablet 0   aspirin  EC 81 MG tablet Take 81 mg by mouth daily. Swallow whole.     clopidogrel  (PLAVIX ) 75 MG tablet Take 1 tablet (75 mg total) by mouth daily. 90 tablet 3   Evolocumab  (REPATHA  SURECLICK) 140 MG/ML SOAJ Inject 140 mg as directed every 14 (fourteen) days. 2 mL 4   ezetimibe  (ZETIA ) 10 MG tablet TAKE ONE TABLET BY MOUTH ONCE A DAY 90 tablet 3   lisinopril  (ZESTRIL ) 5 MG tablet TAKE ONE TABLET BY MOUTH ONCE A DAY 90 tablet 1   metoprolol  tartrate (LOPRESSOR ) 25 MG tablet Take 1 tablet (25 mg total) by mouth 2 (two) times daily. 180 tablet 3   nitroGLYCERIN  (NITROSTAT ) 0.4 MG SL tablet Place 1 tablet (0.4 mg total) under the tongue every 5 (five) minutes as needed for chest pain. 25 tablet 3   ondansetron  (ZOFRAN ) 4 MG tablet Take 1 tablet (4 mg total) by mouth every 8 (eight) hours as needed for nausea or vomiting. 30 tablet 1   pantoprazole  (PROTONIX ) 40 MG tablet TAKE ONE TABLET BY MOUTH ONCE A DAY 90 tablet 3   Plecanatide (TRULANCE) 3 MG TABS Take 1 tablet (3 mg total) by mouth daily. 30 tablet 3   predniSONE  (DELTASONE ) 20 MG tablet Take one by mouth daily for 3-5 days as needed for gout flare 30 tablet 1   ranolazine  (RANEXA )  1000 MG SR tablet Take 1 tablet (1,000 mg total) by mouth 2 (two) times daily. 90 tablet 3   Semaglutide -Weight Management (WEGOVY ) 2.4 MG/0.75ML SOAJ Inject 2.4 mg into the skin once a week. 9 mL 2   dicyclomine  (BENTYL ) 10 MG capsule Take 2 capsules (20 mg total) by mouth 4 (four) times daily -  before meals and at bedtime. 240 capsule 0   No current facility-administered medications for this visit.    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   Social Drivers of Corporate investment banker Strain: Low Risk  (07/13/2023)   Overall Financial Resource Strain (CARDIA)    Difficulty of Paying Living Expenses: Not hard at all  Food Insecurity: No Food Insecurity (07/13/2023)   Hunger Vital Sign    Worried About Running Out of Food in the Last Year: Never true    Ran Out of Food in the Last Year: Never true  Transportation Needs: No Transportation Needs (07/13/2023)   PRAPARE - Administrator, Civil Service (Medical): No    Lack of Transportation (Non-Medical): No  Physical Activity: Sufficiently Active (07/13/2023)   Exercise Vital Sign    Days of Exercise per Week: 5 days    Minutes of  Exercise per Session: 60 min  Stress: No Stress Concern Present (07/13/2023)   [employer] of Occupational Health - Occupational Stress Questionnaire    Feeling of Stress : Not at all  Social Connections: Moderately Integrated (07/13/2023)   Social Connection and Isolation Panel    Frequency of Communication with Friends and Family: More than three times a week    Frequency of Social Gatherings with Friends and Family: More than three times a week    Attends Religious Services: More than 4 times per year    Active Member of [employer] or Organizations: Yes    Attends Engineer, structural: More than 4 times per year    Marital Status: Divorced  Intimate Partner Violence: Not At Risk (07/13/2023)   Humiliation, Afraid, Rape, and Kick questionnaire    Fear of Current or Ex-Partner: No    Emotionally Abused: No    Physically Abused: No    Sexually Abused: No    Review of Systems:    Constitutional: No weight loss, fever, chills, weakness or fatigue HEENT: Eyes: No change in vision               Ears, Nose, Throat:  No change in hearing or congestion Skin: No rash or itching Cardiovascular: No chest pain, chest pressure or palpitations   Respiratory: No SOB or cough Gastrointestinal: See HPI and otherwise negative Genitourinary: No dysuria or change in urinary frequency Neurological: No headache, dizziness or syncope Musculoskeletal: No new muscle or joint pain Hematologic: No bleeding or bruising Psychiatric: No history of depression or anxiety    Physical Exam:  Vital signs: BP 122/80   Ht 5' 3 (1.6 m)   Wt 194 lb (88 kg)   BMI 34.37 kg/m   Constitutional: NAD, alert and cooperative Head:  Normocephalic and atraumatic. Eyes:   PEERL, EOMI. No icterus. Conjunctiva pink. Respiratory: Respirations even and unlabored. Lungs clear to auscultation bilaterally.   No wheezes, crackles, or rhonchi.  Cardiovascular:  Regular rate and rhythm. No peripheral edema, cyanosis  or pallor.  Gastrointestinal:  Soft, nondistended, nontender. No rebound or guarding. Normal bowel sounds.  No appreciable masses or hepatomegaly. Rectal:  Declines Msk:  Symmetrical without gross deformities. Without edema, no deformity or joint abnormality.  Neurologic:  Alert and  oriented x4;  grossly normal neurologically.  Skin:   Dry and intact without significant lesions or rashes. Psychiatric: Oriented to person, place and time. Demonstrates good judgement and reason without abnormal affect or behaviors.  Physical Exam    RELEVANT LABS AND IMAGING: CBC    Component Value Date/Time   WBC 8.3 10/12/2023 0953   RBC 4.19 10/12/2023 0953   HGB 12.9 10/12/2023 0953   HCT 39.3 10/12/2023 0953   PLT 320.0 10/12/2023 0953   MCV 93.7 10/12/2023 0953   MCH 30.4 04/20/2021 0304   MCHC 33.0 10/12/2023 0953   RDW 13.6 10/12/2023 0953   LYMPHSABS 1.5 04/17/2021 0356   MONOABS 0.9 04/17/2021 0356   EOSABS 0.0 04/17/2021 0356   BASOSABS 0.0 04/17/2021 0356    CMP     Component Value Date/Time   NA 137 10/12/2023 0953   NA 138 09/12/2023 0000   K 4.3 10/12/2023 0953   CL 100 10/12/2023 0953   CO2 28 10/12/2023 0953   GLUCOSE 97 10/12/2023 0953   BUN 19 10/12/2023 0953   BUN 21 09/12/2023 0000   CREATININE 1.32 (H) 10/12/2023 0953   CREATININE 0.90 04/15/2014 0853   CALCIUM  9.8 10/12/2023 0953   PROT 6.8 10/12/2023 0953   PROT 6.7 10/02/2019 0850   ALBUMIN 3.9 10/12/2023 0953   ALBUMIN 4.0 10/02/2019 0850   AST 11 10/12/2023 0953   ALT 10 10/12/2023 0953   ALKPHOS 98 10/12/2023 0953   BILITOT 0.6 10/12/2023 0953   BILITOT 0.2 10/02/2019 0850   GFRNONAA 53 (L) 04/20/2021 0304   GFRNONAA 71 04/15/2014 0853   GFRAA >60 02/07/2015 0348   GFRAA 82 04/15/2014 0853     Assessment/Plan:   IBS-C Chronic constipation with left lower abdominal pain.  Previous treatments including Citrucel, MiraLAX were ineffective.  Linzess caused diarrhea.  Wegovy  likely exacerbating  symptoms.  Normal thyroid .  Up-to-date on colonoscopy.  Likely pelvic floor component. - Trial of Trulance 3 Mg p.o. daily for 30 days with 3 refills - Squatty potty to aid in bowel movements - Consider pelvic floor physical therapy - Increase water, increase fiber, increase exercise - If no improvement in Trulance can consider Amitiza - Follow-up with me in October  GERD Worsening GERD with worsening constipation currently on pantoprazole  40 Mg once daily. - Increase PPI to twice daily for 30 days - Educated patient on lifestyle modifications provide patient education handout - Optimize bowel regimen - Call if worsening symptoms    Terrel Nesheiwat [facility] 01/19/2024, 4:03 PM  Cc: Kaylee Partridge, MD

## 2024-01-23 ENCOUNTER — Telehealth: Admitting: Physician Assistant

## 2024-01-23 DIAGNOSIS — J069 Acute upper respiratory infection, unspecified: Secondary | ICD-10-CM | POA: Diagnosis not present

## 2024-01-23 MED ORDER — PROMETHAZINE-DM 6.25-15 MG/5ML PO SYRP: 5.0000 mL | ORAL_SOLUTION | Freq: Four times a day (QID) | ORAL | 0 refills | Status: DC | PRN

## 2024-01-23 MED ORDER — FLUTICASONE PROPIONATE 50 MCG/ACT NA SUSP: 2.0000 | Freq: Every day | NASAL | 0 refills | Status: DC

## 2024-01-23 MED ORDER — PSEUDOEPH-BROMPHEN-DM 30-2-10 MG/5ML PO SYRP: 5.0000 mL | ORAL_SOLUTION | Freq: Four times a day (QID) | ORAL | 0 refills | Status: DC | PRN

## 2024-01-23 MED ORDER — LIDOCAINE VISCOUS HCL 2 % MT SOLN: OROMUCOSAL | 0 refills | Status: DC

## 2024-01-23 MED ORDER — PREDNISONE 20 MG PO TABS: 40.0000 mg | ORAL_TABLET | Freq: Every day | ORAL | 0 refills | Status: DC

## 2024-01-23 NOTE — Patient Instructions (Signed)
 [name], thank you for joining Sabrina CHRISTELLA Dickinson, [name] for today's virtual visit.  While this provider is not your primary care provider (PCP), if your PCP is located in our provider database this encounter information will be shared with them immediately following your visit.   A [facility] MyChart account gives you access to today's visit and all your visits, tests, and labs performed at [facility]  click here if you don't have a [facility] MyChart account or go to [email or web address]  Consent: (Patient) [name] provided verbal consent for this virtual visit at the beginning of the encounter.  Current Medications:  Current Outpatient Medications:    fluticasone (FLONASE) 50 MCG/ACT nasal spray, Place 2 sprays into both nostrils daily., Disp: 16 g, Rfl: 0   lidocaine  (XYLOCAINE ) 2 % solution, Swallow 10mL every 6 hours as needed for sore throat, Disp: 100 mL, Rfl: 0   predniSONE  (DELTASONE ) 20 MG tablet, Take 2 tablets (40 mg total) by mouth daily with breakfast., Disp: 14 tablet, Rfl: 0   promethazine -dextromethorphan (PROMETHAZINE -DM) 6.25-15 MG/5ML syrup, Take 5 mLs by mouth 4 (four) times daily as needed., Disp: 118 mL, Rfl: 0   allopurinol  (ZYLOPRIM ) 100 MG tablet, Take 3 tablets (300 mg total) by mouth daily. (Patient taking differently: Take 300 mg by mouth as needed.), Disp: 270 tablet, Rfl: 3   ALPRAZolam  (XANAX ) 0.5 MG tablet, TAKE ONE TABLET BY MOUTH THREE TIMES DAILY AS NEEDED FOR ANXIETY (Patient taking differently: Take 0.5 mg by mouth daily at 6 (six) AM. for anxiety), Disp: 90 tablet, Rfl: 0   amLODipine  (NORVASC ) 5 MG tablet, Take 1 tablet (5 mg total) by mouth daily., Disp: 90 tablet, Rfl: 3   amoxicillin -clavulanate (AUGMENTIN ) 875-125 MG tablet, Take 1 tablet by mouth 2 (two) times daily., Disp: 20 tablet, Rfl: 0   aspirin  EC 81 MG tablet, Take 81 mg by mouth daily. Swallow whole., Disp: , Rfl:    clopidogrel  (PLAVIX ) 75 MG  tablet, Take 1 tablet (75 mg total) by mouth daily., Disp: 90 tablet, Rfl: 3   dicyclomine  (BENTYL ) 10 MG capsule, Take 2 capsules (20 mg total) by mouth 4 (four) times daily -  before meals and at bedtime., Disp: 240 capsule, Rfl: 0   Evolocumab  (REPATHA  SURECLICK) 140 MG/ML SOAJ, Inject 140 mg as directed every 14 (fourteen) days., Disp: 2 mL, Rfl: 4   ezetimibe  (ZETIA ) 10 MG tablet, TAKE ONE TABLET BY MOUTH ONCE A DAY, Disp: 90 tablet, Rfl: 3   lisinopril  (ZESTRIL ) 5 MG tablet, TAKE ONE TABLET BY MOUTH ONCE A DAY, Disp: 90 tablet, Rfl: 1   metoprolol  tartrate (LOPRESSOR ) 25 MG tablet, Take 1 tablet (25 mg total) by mouth 2 (two) times daily., Disp: 180 tablet, Rfl: 3   nitroGLYCERIN  (NITROSTAT ) 0.4 MG SL tablet, Place 1 tablet (0.4 mg total) under the tongue every 5 (five) minutes as needed for chest pain., Disp: 25 tablet, Rfl: 3   ondansetron  (ZOFRAN ) 4 MG tablet, Take 1 tablet (4 mg total) by mouth every 8 (eight) hours as needed for nausea or vomiting., Disp: 30 tablet, Rfl: 1   pantoprazole  (PROTONIX ) 40 MG tablet, TAKE ONE TABLET BY MOUTH ONCE A DAY, Disp: 90 tablet, Rfl: 3   Plecanatide  (TRULANCE ) 3 MG TABS, Take 1 tablet (3 mg total) by mouth daily., Disp: 30 tablet, Rfl: 3   ranolazine  (RANEXA ) 1000 MG SR tablet, Take 1 tablet (1,000 mg total) by mouth 2 (two) times daily., Disp: 90 tablet, Rfl: 3  Semaglutide -Weight Management (WEGOVY ) 2.4 MG/0.75ML SOAJ, Inject 2.4 mg into the skin once a week., Disp: 9 mL, Rfl: 2   Medications ordered in this encounter:  Meds ordered this encounter  Medications   predniSONE  (DELTASONE ) 20 MG tablet    Sig: Take 2 tablets (40 mg total) by mouth daily with breakfast.    Dispense:  14 tablet    Refill:  0    Supervising Provider:   LAMPTEY, PHILIP O [[id number]]   fluticasone (FLONASE) 50 MCG/ACT nasal spray    Sig: Place 2 sprays into both nostrils daily.    Dispense:  16 g    Refill:  0    Supervising Provider:   LAMPTEY, PHILIP O [[id number]]    lidocaine  (XYLOCAINE ) 2 % solution    Sig: Swallow 10mL every 6 hours as needed for sore throat    Dispense:  100 mL    Refill:  0    Supervising Provider:   BLAISE ALEENE KIDD [[id number]]   DISCONTD: brompheniramine-pseudoephedrine-DM 30-2-10 MG/5ML syrup    Sig: Take 5 mLs by mouth 4 (four) times daily as needed.    Dispense:  120 mL    Refill:  0    Supervising Provider:   LAMPTEY, PHILIP O [[id number]]   promethazine -dextromethorphan (PROMETHAZINE -DM) 6.25-15 MG/5ML syrup    Sig: Take 5 mLs by mouth 4 (four) times daily as needed.    Dispense:  118 mL    Refill:  0    Please fill Prom-DM instead of Bromfed DM, it was sent accidentally    Supervising Provider:   BLAISE ALEENE KIDD [[id number]]     *If you need refills on other medications prior to your next appointment, please contact your pharmacy*  Follow-Up: Call back or seek an in-person evaluation if the symptoms worsen or if the condition fails to improve as anticipated.  [facility] [phone]  Other Instructions Viral Respiratory Infection A respiratory infection is an illness that affects part of the respiratory system, such as the lungs, nose, or throat. A respiratory infection that is caused by a virus is called a viral respiratory infection. Common types of viral respiratory infections include: A cold. The flu (influenza). A respiratory syncytial virus (RSV) infection. What are the causes? This condition is caused by a virus. The virus may spread through contact with droplets or direct contact with infected people or their mucus or secretions. The virus may spread from person to person (is contagious). What are the signs or symptoms? Symptoms of this condition include: A stuffy or runny nose. A sore throat or cough. Shortness of breath or difficulty breathing. Yellow or green mucus (sputum). Other symptoms may include: A fever. Sweating or chills. Fatigue. Achy muscles. A headache. How is this  diagnosed? This condition may be diagnosed based on: Your symptoms. A physical exam. Testing of secretions from the nose or throat. Chest X-ray. How is this treated? This condition may be treated with medicines, such as: Antiviral medicine. This may shorten the length of time a person has symptoms. Expectorants. These make it easier to cough up mucus. Decongestant nasal sprays. Acetaminophen  or NSAIDs, such as ibuprofen, to relieve fever and pain. Antibiotic medicines are not prescribed for viral infections.This is because antibiotics are designed to kill bacteria. They do not kill viruses. Follow these instructions at home: Managing pain and congestion Take over-the-counter and prescription medicines only as told by your health care provider. If you have a sore throat, gargle with a mixture of salt  and water 3-4 times a day or as needed. To make salt water, completely dissolve -1 tsp (3-6 g) of salt in 1 cup (237 mL) of warm water. Use nose drops made from salt water to ease congestion and soften raw skin around your nose. Take 2 tsp (10 mL) of honey at bedtime to lessen coughing at night. Do not give honey to children who are younger than 1 year. Drink enough fluid to keep your urine pale yellow. This helps prevent dehydration and helps loosen up mucus. General instructions  Rest as much as possible. Do not drink alcohol. Do not use any products that contain nicotine or tobacco. These products include cigarettes, chewing tobacco, and vaping devices, such as e-cigarettes. If you need help quitting, ask your health care provider. Keep all follow-up visits. This is important. How is this prevented?     Get an annual flu shot. You may get the flu shot in late summer, fall, or winter. Ask your health care provider when you should get your flu shot. Avoid spreading your infection to other people. If you are sick: Wash your hands with soap and water often, especially after you cough or  sneeze. Wash for at least 20 seconds. If soap and water are not available, use alcohol-based hand sanitizer. Cover your mouth when you cough. Cover your nose and mouth when you sneeze. Do not share cups or eating utensils. Clean commonly used objects often. Clean commonly touched surfaces. Stay home from work or school as told by your health care provider. Avoid contact with people who are sick during cold and flu season. This is generally fall and winter. Contact a health care provider if: Your symptoms last for 10 days or longer. Your symptoms get worse over time. You have severe sinus pain in your face or forehead. The glands in your jaw or neck become very swollen. You have shortness of breath. Get help right away if you: Feel pain or pressure in your chest. Have trouble breathing. Faint or feel like you will faint. Have severe and persistent vomiting. Feel confused or disoriented. These symptoms may represent a serious problem that is an emergency. Do not wait to see if the symptoms will go away. Get medical help right away. Call your local emergency services (911 in the [place] Do not drive yourself to the hospital. Summary A respiratory infection is an illness that affects part of the respiratory system, such as the lungs, nose, or throat. A respiratory infection that is caused by a virus is called a viral respiratory infection. Common types of viral respiratory infections include a cold, influenza, and respiratory syncytial virus (RSV) infection. Symptoms of this condition include a stuffy or runny nose, cough, fatigue, achy muscles, sore throat, and fevers or chills. Antibiotic medicines are not prescribed for viral infections. This is because antibiotics are designed to kill bacteria. They are not effective against viruses. This information is not intended to replace advice given to you by your health care provider. Make sure you discuss any questions you have with your health care  provider. Document Revised: 10/23/2020 Document Reviewed: 10/23/2020 Elsevier Patient Education  2024 Elsevier Inc.   If you have been instructed to have an in-person evaluation today at a local Urgent Care facility, please use the link below. It will take you to a list of all of our available [facility], including address, phone number and hours of operation. Please do not delay care.  [facility]  If  you or a family member do not have a primary care provider, use the link below to schedule a visit and establish care. When you choose a [facility] primary care physician or advanced practice provider, you gain a long-term partner in health. Find a Primary Care Provider  Learn more about [facility] in-office and virtual care options: [facility] Get Care Now

## 2024-01-23 NOTE — Progress Notes (Signed)
 Virtual Visit Consent   [name], you are scheduled for a virtual visit with a [facility] provider today. Just as with appointments in the office, your consent must be obtained to participate. Your consent will be active for this visit and any virtual visit you may have with one of our providers in the next 365 days. If you have a MyChart account, a copy of this consent can be sent to you electronically.  As this is a virtual visit, video technology does not allow for your provider to perform a traditional examination. This may limit your provider's ability to fully assess your condition. If your provider identifies any concerns that need to be evaluated in person or the need to arrange testing (such as labs, EKG, etc.), we will make arrangements to do so. Although advances in technology are sophisticated, we cannot ensure that it will always work on either your end or our end. If the connection with a video visit is poor, the visit may have to be switched to a telephone visit. With either a video or telephone visit, we are not always able to ensure that we have a secure connection.  By engaging in this virtual visit, you consent to the provision of healthcare and authorize for your insurance to be billed (if applicable) for the services provided during this visit. Depending on your insurance coverage, you may receive a charge related to this service.  I need to obtain your verbal consent now. Are you willing to proceed with your visit today? [name] has provided verbal consent on 01/23/2024 for a virtual visit (video or telephone). [name], [name]  Date: 01/23/2024 8:29 AM   Virtual Visit via Video Note   I, [name], connected with  [name]  ([id number], 08-May-1956) on 01/23/24 at  8:15 AM EDT by a video-enabled telemedicine application and verified that I am speaking with the correct person using two identifiers.  Location: Patient: Virtual  Visit Location Patient: Home Provider: Virtual Visit Location Provider: Home Office   I discussed the limitations of evaluation and management by telemedicine and the availability of in person appointments. The patient expressed understanding and agreed to proceed.    History of Present Illness: [name] is a 68 y.o. who identifies as a female who was assigned female at birth, and is being seen today for flu-like illness.  HPI: URI  This is a new problem. The current episode started in the past 7 days (started Friday, 01/20/24). The problem has been gradually worsening. The maximum temperature recorded prior to her arrival was 101 - 101.9 F (101.5). Associated symptoms include congestion, ear pain, headaches, nausea (from wegovy ), a plugged ear sensation, rhinorrhea, sinus pain and a sore throat. Pertinent negatives include no coughing, diarrhea, vomiting or wheezing. Associated symptoms comments: mylagias. Treatments tried: tylenol  cold and flu. The treatment provided mild relief.     Problems:  Patient Active Problem List   Diagnosis Date Noted   Family history of colon cancer 04/07/2023   Antiplatelet or antithrombotic long-term use 04/07/2023   Esophageal stenosis 04/07/2023   Acute GI bleeding 04/17/2021   Colitis 04/17/2021   Lower abdominal pain 01/28/2021   Dyspnea on exertion 10/24/2019   Pre-diabetes 01/14/2017   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   Tachycardia, somewhat irregular, episodic 10/08/2013   Dizziness 10/08/2013   Exertional angina (HCC) 05/22/2013   Unstable angina (HCC) 01/22/2013   Hyperlipidemia 01/01/2013   CAD (coronary artery disease), possible  SCAD 12/14/2012   NSTEMI (non-ST elevated myocardial infarction) (HCC) 12/10/2012   Essential hypertension 12/10/2012   Hypothyroidism 12/10/2012   Obesity (BMI 35.0-39.9 without comorbidity) 12/10/2012   Gastroenteritis 10/08/2011   Acute ischemic colitis (HCC) 08/09/2011   Dysphagia 07/01/2010    RECTAL BLEEDING Jan 2013 11/04/2009   GERD 02/11/2009   Irritable bowel syndrome 02/11/2009   Generalized abdominal pain 02/11/2009   History of colonic polyps 02/11/2009    Allergies: No Known Allergies Medications:  Current Outpatient Medications:    fluticasone (FLONASE) 50 MCG/ACT nasal spray, Place 2 sprays into both nostrils daily., Disp: 16 g, Rfl: 0   lidocaine  (XYLOCAINE ) 2 % solution, Swallow 10mL every 6 hours as needed for sore throat, Disp: 100 mL, Rfl: 0   predniSONE  (DELTASONE ) 20 MG tablet, Take 2 tablets (40 mg total) by mouth daily with breakfast., Disp: 14 tablet, Rfl: 0   promethazine -dextromethorphan (PROMETHAZINE -DM) 6.25-15 MG/5ML syrup, Take 5 mLs by mouth 4 (four) times daily as needed., Disp: 118 mL, Rfl: 0   allopurinol  (ZYLOPRIM ) 100 MG tablet, Take 3 tablets (300 mg total) by mouth daily. (Patient taking differently: Take 300 mg by mouth as needed.), Disp: 270 tablet, Rfl: 3   ALPRAZolam  (XANAX ) 0.5 MG tablet, TAKE ONE TABLET BY MOUTH THREE TIMES DAILY AS NEEDED FOR ANXIETY (Patient taking differently: Take 0.5 mg by mouth daily at 6 (six) AM. for anxiety), Disp: 90 tablet, Rfl: 0   amLODipine  (NORVASC ) 5 MG tablet, Take 1 tablet (5 mg total) by mouth daily., Disp: 90 tablet, Rfl: 3   amoxicillin -clavulanate (AUGMENTIN ) 875-125 MG tablet, Take 1 tablet by mouth 2 (two) times daily., Disp: 20 tablet, Rfl: 0   aspirin  EC 81 MG tablet, Take 81 mg by mouth daily. Swallow whole., Disp: , Rfl:    clopidogrel  (PLAVIX ) 75 MG tablet, Take 1 tablet (75 mg total) by mouth daily., Disp: 90 tablet, Rfl: 3   dicyclomine  (BENTYL ) 10 MG capsule, Take 2 capsules (20 mg total) by mouth 4 (four) times daily -  before meals and at bedtime., Disp: 240 capsule, Rfl: 0   Evolocumab  (REPATHA  SURECLICK) 140 MG/ML SOAJ, Inject 140 mg as directed every 14 (fourteen) days., Disp: 2 mL, Rfl: 4   ezetimibe  (ZETIA ) 10 MG tablet, TAKE ONE TABLET BY MOUTH ONCE A DAY, Disp: 90 tablet, Rfl: 3    lisinopril  (ZESTRIL ) 5 MG tablet, TAKE ONE TABLET BY MOUTH ONCE A DAY, Disp: 90 tablet, Rfl: 1   metoprolol  tartrate (LOPRESSOR ) 25 MG tablet, Take 1 tablet (25 mg total) by mouth 2 (two) times daily., Disp: 180 tablet, Rfl: 3   nitroGLYCERIN  (NITROSTAT ) 0.4 MG SL tablet, Place 1 tablet (0.4 mg total) under the tongue every 5 (five) minutes as needed for chest pain., Disp: 25 tablet, Rfl: 3   ondansetron  (ZOFRAN ) 4 MG tablet, Take 1 tablet (4 mg total) by mouth every 8 (eight) hours as needed for nausea or vomiting., Disp: 30 tablet, Rfl: 1   pantoprazole  (PROTONIX ) 40 MG tablet, TAKE ONE TABLET BY MOUTH ONCE A DAY, Disp: 90 tablet, Rfl: 3   Plecanatide  (TRULANCE ) 3 MG TABS, Take 1 tablet (3 mg total) by mouth daily., Disp: 30 tablet, Rfl: 3   ranolazine  (RANEXA ) 1000 MG SR tablet, Take 1 tablet (1,000 mg total) by mouth 2 (two) times daily., Disp: 90 tablet, Rfl: 3   Semaglutide -Weight Management (WEGOVY ) 2.4 MG/0.75ML SOAJ, Inject 2.4 mg into the skin once a week., Disp: 9 mL, Rfl: 2  Observations/Objective: Patient is well-developed,  well-nourished in no acute distress.  Resting comfortably at home.  Head is normocephalic, atraumatic.  No labored breathing.  Speech is clear and coherent with logical content.  Patient is alert and oriented at baseline.    Assessment and Plan: 1. Viral URI with cough (Primary) - predniSONE  (DELTASONE ) 20 MG tablet; Take 2 tablets (40 mg total) by mouth daily with breakfast.  Dispense: 14 tablet; Refill: 0 - fluticasone (FLONASE) 50 MCG/ACT nasal spray; Place 2 sprays into both nostrils daily.  Dispense: 16 g; Refill: 0 - lidocaine  (XYLOCAINE ) 2 % solution; Swallow 10mL every 6 hours as needed for sore throat  Dispense: 100 mL; Refill: 0 - promethazine -dextromethorphan (PROMETHAZINE -DM) 6.25-15 MG/5ML syrup; Take 5 mLs by mouth 4 (four) times daily as needed.  Dispense: 118 mL; Refill: 0  - Suspect viral URI - Symptomatic medications of choice over the  counter as needed - Added Prednisone  for pain and inflammation - Flonase for nasal congestion and ear pressure - Viscous lidocaine  for sore throat - Promethazine  DM cough syrup as needed - Push fluids - Rest - Seek further evaluation if symptoms change or worsen   Follow Up Instructions: I discussed the assessment and treatment plan with the patient. The patient was provided an opportunity to ask questions and all were answered. The patient agreed with the plan and demonstrated an understanding of the instructions.  A copy of instructions were sent to the patient via MyChart unless otherwise noted below.    The patient was advised to call back or seek an in-person evaluation if the symptoms worsen or if the condition fails to improve as anticipated.    [name], [name]

## 2024-02-06 ENCOUNTER — Other Ambulatory Visit: Payer: Self-pay | Admitting: Family Medicine

## 2024-02-06 ENCOUNTER — Telehealth: Payer: Self-pay | Admitting: Family Medicine

## 2024-02-06 NOTE — Telephone Encounter (Signed)
 Duplicate request

## 2024-02-06 NOTE — Telephone Encounter (Signed)
 Copied from CRM [id number]. Topic: Clinical - Medication Refill >> Feb 06, 2024 10:56 AM Gennette ORN wrote: Medication: ondansetron  (ZOFRAN ) 4 MG tablet  Has the patient contacted their pharmacy? Yes (Agent: If no, request that the patient contact the pharmacy for the refill. If patient does not wish to contact the pharmacy document the reason why and proceed with request.) (Agent: If yes, when and what did the pharmacy advise?)  This is the patient's preferred pharmacy:  [facility] [id number] [place] - [address] [place] [address] Phone: [phone] Fax: [phone]  Is this the correct pharmacy for this prescription? Yes If no, delete pharmacy and type the correct one.   Has the prescription been filled recently? Yes  Is the patient out of the medication? No  Has the patient been seen for an appointment in the last year OR does the patient have an upcoming appointment? Yes  Can we respond through MyChart? Yes  Agent: Please be advised that Rx refills may take up to 3 business days. We ask that you follow-up with your pharmacy.

## 2024-02-07 ENCOUNTER — Telehealth: Payer: Self-pay

## 2024-02-07 DIAGNOSIS — E66812 Obesity, class 2: Secondary | ICD-10-CM

## 2024-02-07 MED ORDER — ONDANSETRON HCL 4 MG PO TABS: 4.0000 mg | ORAL_TABLET | Freq: Three times a day (TID) | ORAL | 1 refills | Status: DC | PRN

## 2024-02-07 NOTE — Addendum Note (Signed)
 Addended by: WATT RAISIN C on: 02/07/2024 09:52 AM   Modules accepted: Orders

## 2024-02-07 NOTE — Telephone Encounter (Signed)
 Copied from CRM [id number]. Topic: Clinical - Medication Question >> Feb 06, 2024 12:37 PM Suzen RAMAN wrote: Reason for CRM: Patient would like to know if medication ondansetron  (ZOFRAN ) 4 MG tablet can be bridged til her upcoming appt on  02/15/24. [id number] >> Feb 06, 2024  5:02 PM Corin V wrote: Patient called to follow up on this refill. She is going out of town Wednesday and asked if there is anyway to get this to the pharmacy tomorrow to ensure she can pick it up before her trip.

## 2024-02-11 NOTE — Patient Instructions (Incomplete)
 It was good to see you again today  We did your pneumonia vaccine today You might consider getting the chicken pox/ varicella vaccine as you never had this as a child!  Tetanus due next year  Since Wegovy  is causing vomiting and not helping much w/ weight loss let's stop it and try the sample of tirzepatide  I gave you- Mounjaro  2.5 Try this and let me know how you feel. We can go up on the dose if you tolerate better

## 2024-02-11 NOTE — Progress Notes (Unsigned)
 [facility] [address] [place], [place] [address] [phone]  Date:  02/15/2024   Name:  [name]   DOB:  1955/10/20   MRN:  [id number]  PCP:  Watt Harlene BROCKS, MD    Chief Complaint: No chief complaint on file.   History of Present Illness:  [name] is a 69 y.o. very pleasant female patient who presents with the following:  Patient seen today for medication follow-up.  Most recent visit with myself was in March  History of HTN, CAD/NSTEMI, obesity, hyperlipidemia, IBS, hypothyroidism, GI bleed 2022, chronic moderate renal insufficiency. She is under nephrology care for her renal insufficiency-most recent visit in February She saw cardiology last month to follow-up on CAD; stable, no chest pain.  Continue aspirin  and Plavix .  Intolerant to statins, on Zetia  and Repatha .  Also seen by gastroenterology last month for IBSL IBS-C Chronic constipation with left lower abdominal pain.  Previous treatments including Citrucel, MiraLAX were ineffective.  Linzess caused diarrhea.  Wegovy  likely exacerbating symptoms.  Normal thyroid .  Up-to-date on colonoscopy.  Likely pelvic floor component. - Trial of Trulance  3 Mg p.o. daily for 30 days with 3 refills - Squatty potty to aid in bowel movements - Consider pelvic floor physical therapy - Increase water, increase fiber, increase exercise - If no improvement in Trulance  can consider Amitiza - Follow-up with me in October GERD Worsening GERD with worsening constipation currently on pantoprazole  40 Mg once daily. - Increase PPI to twice daily for 30 days - Educated patient on lifestyle modifications provide patient education handout - Optimize bowel regimen - Call if worsening symptoms  Can update blood work today if desired.  She did have some labs in March: CMP and CBC only  Recommend shingles series Recommend pneumonia vaccine Patient Active  Problem List   Diagnosis Date Noted   Family history of colon cancer 04/07/2023   Antiplatelet or antithrombotic long-term use 04/07/2023   Esophageal stenosis 04/07/2023   Acute GI bleeding 04/17/2021   Colitis 04/17/2021   Lower abdominal pain 01/28/2021   Dyspnea on exertion 10/24/2019   Pre-diabetes 01/14/2017   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   Tachycardia, somewhat irregular, episodic 10/08/2013   Dizziness 10/08/2013   Exertional angina (HCC) 05/22/2013   Unstable angina (HCC) 01/22/2013   Hyperlipidemia 01/01/2013   CAD (coronary artery disease), possible SCAD 12/14/2012   NSTEMI (non-ST elevated myocardial infarction) (HCC) 12/10/2012   Essential hypertension 12/10/2012   Hypothyroidism 12/10/2012   Obesity (BMI 35.0-39.9 without comorbidity) 12/10/2012   Gastroenteritis 10/08/2011   Acute ischemic colitis (HCC) 08/09/2011   Dysphagia 07/01/2010   RECTAL BLEEDING Jan 2013 11/04/2009   GERD 02/11/2009   Irritable bowel syndrome 02/11/2009   Generalized abdominal pain 02/11/2009   History of colonic polyps 02/11/2009    Past Medical History:  Diagnosis Date   Angina effort 05/22/2013   Anxiety    CAD (coronary artery disease), possible SCAD 12/14/2012   Colon polyps    Duodenitis    Dyspnea on exertion    LEXISCAN , 09/30/2008 - normal, EKG negative for ischemia, no ECG changes   Esophageal stricture    Gastritis    GERD (gastroesophageal reflux disease)    Gout    only from RX given after MI (01/22/2013)   H/O hiatal hernia    History of esophageal stricture    Hypertension    2D ECHO, 09/30/2008 - EF >55%, normal: RENAL  DOPPLER, 03/21/2007 - normal duplex study   Hypothyroidism    IBS (irritable bowel syndrome)    Ischemic colitis (HCC)    Lower GI bleeding    10/07/11 I've had 3 episodes in the past year   Myocardial infarction [facility]) 12/10/2012   spontaneous coronary artery dissection (01/22/2013)   Tubular adenoma polyp of rectum 2008    Past  Surgical History:  Procedure Laterality Date   ABDOMINAL HYSTERECTOMY  1999   ANTERIOR LUMBAR FUSION  2010   L4-5 (01/22/2013)   CARDIAC CATHETERIZATION N/A 02/06/2015   Procedure: Left Heart Cath and Coronary Angiography;  Surgeon: Alm LELON Clay, MD;  Location: [facility] INVASIVE CV LAB;  Service: Cardiovascular;  Laterality: N/A;   ESOPHAGEAL DILATION  08/2010   ESOPHAGEAL DILATION     once or twice (01/22/2013)   HAMMER TOE SURGERY Bilateral ~ 2002   LEFT HEART CATHETERIZATION WITH CORONARY ANGIOGRAM N/A 12/11/2012   Procedure: LEFT HEART CATHETERIZATION WITH CORONARY ANGIOGRAM;  Surgeon: Alm LELON Clay, MD;  Location: [facility] CATH LAB;  Service: Cardiovascular;  Laterality: N/A;   LEFT HEART CATHETERIZATION WITH CORONARY ANGIOGRAM N/A 12/15/2012   Procedure: LEFT HEART CATHETERIZATION WITH CORONARY ANGIOGRAM;  Surgeon: Alm LELON Clay, MD;  Location: [facility] CATH LAB;  Service: Cardiovascular;  Laterality: N/A;   PERIPHERAL VENOUS STUDY  10/03/2008   No evidence of DVT, superficial thrombosis, or Baker's cyst   TONSILLECTOMY AND ADENOIDECTOMY  1960's   TUBAL LIGATION  1985    Social History   Tobacco Use   Smoking status: Never   Smokeless tobacco: Never  Vaping Use   Vaping status: Never Used  Substance Use Topics   Alcohol use: No   Drug use: No    Family History  Problem Relation Age of Onset   Colon cancer Brother 79   Kidney disease Mother    Stroke Father    Multiple myeloma Sister    Heart attack Brother    Other Brother    Other Brother     No Known Allergies  Medication list has been reviewed and updated.  Current Outpatient Medications on File Prior to Visit  Medication Sig Dispense Refill   allopurinol  (ZYLOPRIM ) 100 MG tablet Take 3 tablets (300 mg total) by mouth daily. (Patient taking differently: Take 300 mg by mouth as needed.) 270 tablet 3   ALPRAZolam  (XANAX ) 0.5 MG tablet TAKE ONE TABLET BY MOUTH THREE TIMES DAILY AS NEEDED FOR ANXIETY (Patient taking  differently: Take 0.5 mg by mouth daily at 6 (six) AM. for anxiety) 90 tablet 0   amLODipine  (NORVASC ) 5 MG tablet Take 1 tablet (5 mg total) by mouth daily. 90 tablet 3   amoxicillin -clavulanate (AUGMENTIN ) 875-125 MG tablet Take 1 tablet by mouth 2 (two) times daily. 20 tablet 0   aspirin  EC 81 MG tablet Take 81 mg by mouth daily. Swallow whole.     clopidogrel  (PLAVIX ) 75 MG tablet Take 1 tablet (75 mg total) by mouth daily. 90 tablet 3   dicyclomine  (BENTYL ) 10 MG capsule Take 2 capsules (20 mg total) by mouth 4 (four) times daily -  before meals and at bedtime. 240 capsule 0   Evolocumab  (REPATHA  SURECLICK) 140 MG/ML SOAJ Inject 140 mg as directed every 14 (fourteen) days. 2 mL 4   ezetimibe  (ZETIA ) 10 MG tablet TAKE ONE TABLET BY MOUTH ONCE A DAY 90 tablet 3   fluticasone  (FLONASE ) 50 MCG/ACT nasal spray Place 2 sprays into both nostrils daily. 16 g 0   lidocaine  (  XYLOCAINE ) 2 % solution Swallow 10mL every 6 hours as needed for sore throat 100 mL 0   lisinopril  (ZESTRIL ) 5 MG tablet TAKE ONE TABLET BY MOUTH ONCE A DAY 90 tablet 1   metoprolol  tartrate (LOPRESSOR ) 25 MG tablet Take 1 tablet (25 mg total) by mouth 2 (two) times daily. 180 tablet 3   nitroGLYCERIN  (NITROSTAT ) 0.4 MG SL tablet Place 1 tablet (0.4 mg total) under the tongue every 5 (five) minutes as needed for chest pain. 25 tablet 3   ondansetron  (ZOFRAN ) 4 MG tablet Take 1 tablet (4 mg total) by mouth every 8 (eight) hours as needed for nausea or vomiting. 30 tablet 1   pantoprazole  (PROTONIX ) 40 MG tablet TAKE ONE TABLET BY MOUTH ONCE A DAY 90 tablet 3   Plecanatide  (TRULANCE ) 3 MG TABS Take 1 tablet (3 mg total) by mouth daily. 30 tablet 3   predniSONE  (DELTASONE ) 20 MG tablet Take 2 tablets (40 mg total) by mouth daily with breakfast. 14 tablet 0   promethazine -dextromethorphan (PROMETHAZINE -DM) 6.25-15 MG/5ML syrup Take 5 mLs by mouth 4 (four) times daily as needed. 118 mL 0   ranolazine  (RANEXA ) 1000 MG SR tablet Take 1  tablet (1,000 mg total) by mouth 2 (two) times daily. 90 tablet 3   Semaglutide -Weight Management (WEGOVY ) 2.4 MG/0.75ML SOAJ Inject 2.4 mg into the skin once a week. 9 mL 2   No current facility-administered medications on file prior to visit.    Review of Systems:  As per HPI- otherwise negative.   Physical Examination: There were no vitals filed for this visit. There were no vitals filed for this visit. There is no height or weight on file to calculate BMI. Ideal Body Weight:    GEN: no acute distress. HEENT: Atraumatic, Normocephalic.  Ears and Nose: No external deformity. CV: RRR, No M/G/R. No JVD. No thrill. No extra heart sounds. PULM: CTA B, no wheezes, crackles, rhonchi. No retractions. No resp. distress. No accessory muscle use. ABD: S, NT, ND, +BS. No rebound. No HSM. EXTR: No c/c/e PSYCH: Normally interactive. Conversant.    Assessment and Plan: ***  Signed Harlene Schroeder, MD

## 2024-02-15 ENCOUNTER — Encounter: Payer: Self-pay | Admitting: Family Medicine

## 2024-02-15 ENCOUNTER — Ambulatory Visit (INDEPENDENT_AMBULATORY_CARE_PROVIDER_SITE_OTHER): Admitting: Family Medicine

## 2024-02-15 VITALS — BP 104/76 | HR 71 | Ht 63.0 in | Wt 195.2 lb

## 2024-02-15 DIAGNOSIS — E669 Obesity, unspecified: Secondary | ICD-10-CM

## 2024-02-15 DIAGNOSIS — M1000 Idiopathic gout, unspecified site: Secondary | ICD-10-CM

## 2024-02-15 DIAGNOSIS — Z6834 Body mass index (BMI) 34.0-34.9, adult: Secondary | ICD-10-CM | POA: Diagnosis not present

## 2024-02-15 DIAGNOSIS — Z23 Encounter for immunization: Secondary | ICD-10-CM

## 2024-02-15 DIAGNOSIS — E039 Hypothyroidism, unspecified: Secondary | ICD-10-CM

## 2024-02-15 DIAGNOSIS — R7303 Prediabetes: Secondary | ICD-10-CM | POA: Diagnosis not present

## 2024-02-15 DIAGNOSIS — I10 Essential (primary) hypertension: Secondary | ICD-10-CM

## 2024-02-15 DIAGNOSIS — E782 Mixed hyperlipidemia: Secondary | ICD-10-CM | POA: Diagnosis not present

## 2024-02-15 LAB — LIPID PANEL
Cholesterol: 154 mg/dL (ref 0–200)
HDL: 58.8 mg/dL (ref >39.00)
LDL Cholesterol: 69 mg/dL (ref 0–99)
NonHDL: 94.95
Total CHOL/HDL Ratio: 3
Triglycerides: 128 mg/dL (ref 0.0–149.0)
VLDL: 25.6 mg/dL (ref 0.0–40.0)

## 2024-02-15 LAB — HEMOGLOBIN A1C: Hgb A1c MFr Bld: 5.7 % (ref 4.6–6.5)

## 2024-02-15 LAB — TSH: TSH: 3 u[IU]/mL (ref 0.35–5.50)

## 2024-02-15 MED ORDER — PREDNISONE 20 MG PO TABS: 40.0000 mg | ORAL_TABLET | Freq: Every day | ORAL | 0 refills | Status: AC

## 2024-02-15 MED ORDER — TIRZEPATIDE 2.5 MG/0.5ML ~~LOC~~ SOAJ
2.5000 mg | SUBCUTANEOUS | Status: DC
Start: 2024-02-15 — End: 2024-02-24

## 2024-02-24 ENCOUNTER — Encounter: Payer: Self-pay | Admitting: Family Medicine

## 2024-02-24 ENCOUNTER — Telehealth: Payer: Self-pay

## 2024-02-24 ENCOUNTER — Other Ambulatory Visit (HOSPITAL_COMMUNITY): Payer: Self-pay

## 2024-02-24 MED ORDER — WEGOVY 2.4 MG/0.75ML ~~LOC~~ SOAJ: 2.4000 mg | SUBCUTANEOUS | 2 refills | Status: AC

## 2024-02-24 NOTE — Telephone Encounter (Signed)
 Pharmacy Patient Advocate Encounter   Received notification from CoverMyMeds that prior authorization for Wegovy  2.4MG /0.75ML auto-injectors is required/requested.   Insurance verification completed.   The patient is insured through [employer] [facility] .   Per test claim: PA required; PA submitted to above mentioned insurance via CoverMyMeds Key/confirmation #/EOC [id number] Status is pending

## 2024-02-27 ENCOUNTER — Other Ambulatory Visit (HOSPITAL_COMMUNITY): Payer: Self-pay

## 2024-02-27 NOTE — Telephone Encounter (Signed)
 Additional information has been requested from the patient's insurance in order to proceed with the prior authorization request. Requested information has been sent, or form has been filled out and faxed back to [phone]

## 2024-02-27 NOTE — Telephone Encounter (Signed)
 Pharmacy Patient Advocate Encounter  Received notification from [facility] [employer] that Prior Authorization for Wegovy  2.4mg /0.57ml has been APPROVED from 11/29/23 to 02/26/25. Spoke to pharmacy to process.Copay is $0.    Approval letter indexed to media tab.

## 2024-02-28 ENCOUNTER — Other Ambulatory Visit (HOSPITAL_COMMUNITY): Payer: Self-pay

## 2024-03-05 ENCOUNTER — Other Ambulatory Visit: Payer: Self-pay | Admitting: Family Medicine

## 2024-03-05 DIAGNOSIS — F411 Generalized anxiety disorder: Secondary | ICD-10-CM

## 2024-03-19 ENCOUNTER — Other Ambulatory Visit: Payer: Self-pay | Admitting: Physician Assistant

## 2024-03-19 DIAGNOSIS — Z01818 Encounter for other preprocedural examination: Secondary | ICD-10-CM

## 2024-05-15 NOTE — Progress Notes (Signed)
 [facility] [address] [place], [place] [address] [phone]  Date:  05/17/2024   Name:  [name]   DOB:  [birth year]   MRN:  [id number]  PCP:  Watt Harlene BROCKS, MD    Chief Complaint: Urinary Tract Infection (Onset almost 2 weeks/Pain with urination, cloudy urine and smelly )   History of Present Illness:  [name] is a 68 y.o. very pleasant female patient who presents with the following:  Patient seen today with concern of possible UTI.  I saw her most recently in July History of HTN, CAD/NSTEMI, obesity, hyperlipidemia, IBS, hypothyroidism, GI bleed 2022, chronic moderate renal insufficiency, occasional gout attacks. She is under nephrology care for her renal insufficiency  She also has cardiology care At our last visit she had been using Wegovy  and had lost about 20 pounds but then seemed to stall out at around 195 pounds  Wt Readings from Last 3 Encounters:  05/17/24 187 lb 9.6 oz (85.1 kg)  02/15/24 195 lb 3.2 oz (88.5 kg)  01/19/24 194 lb (88 kg)    -Flu shot; give today  -Recommend COVID booster this fall -Mammogram- she is not sure, she gets through GYN -Recommend Shingrix Discussed the use of AI scribe software for clinical note transcription with the patient, who gave verbal consent to proceed.  History of Present Illness [name] is a 68 year old female who presents with urinary symptoms suggestive of a urinary tract infection.  She has been experiencing urinary symptoms for almost two weeks, including cloudy and malodorous urine. Initially mild, the symptoms have been worsening. She notes one instance of dark urine but denies any hematuria. No fevers, chills, or vomiting are present. She had a urinary tract infection in March and rarely experiences them, with the last one before March occurring a few years ago.  She is experiencing gastrointestinal issues  related to a new medication prescribed by her gastroenterologist, which causes severe diarrhea, described as 'explosions' that sometimes require her to discard clothing. She uses this medication sparingly due to these side effects. She is also on Wegovy  for weight management and has another 10 pounds since July.  She is frustrated by her somewhat slow weight loss, but she is steadily going down.   She does continue to have some side effects from Wegovy -even still vomiting on occasion.  I suggested decreasing her dosage but she does not wish to at this Time She has arthritis in her hips, affecting her ability to exercise. She experiences pain, especially when sitting in a car, but feels better once she starts moving. She has tried arthritis Tylenol  but was advised by her heart doctor to limit its use to two or three days. She is uncertain if the restriction was for Tylenol  or another medication like ibuprofen. She has had back surgery in the past and is not interested in hip surgery. She is considering joining [employer] for exercise and used to walk three miles regularly.  She has not had her flu shot yet but received a pneumonia shot over the summer. She is not interested in continuing mammograms. She had a hysterectomy and questions the need for continued GYN visits.    Patient Active Problem List   Diagnosis Date Noted   Family history of colon cancer 04/07/2023   Antiplatelet or antithrombotic long-term use 04/07/2023   Esophageal stenosis 04/07/2023   Acute GI bleeding 04/17/2021  Colitis 04/17/2021   Lower abdominal pain 01/28/2021   Dyspnea on exertion 10/24/2019   Pre-diabetes 01/14/2017   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   Tachycardia, somewhat irregular, episodic 10/08/2013   Dizziness 10/08/2013   Exertional angina 05/22/2013   Unstable angina (HCC) 01/22/2013   Hyperlipidemia 01/01/2013   CAD (coronary artery disease), possible SCAD 12/14/2012   NSTEMI (non-ST  elevated myocardial infarction) (HCC) 12/10/2012   Essential hypertension 12/10/2012   Hypothyroidism 12/10/2012   Obesity (BMI 35.0-39.9 without comorbidity) 12/10/2012   Gastroenteritis 10/08/2011   Acute ischemic colitis 08/09/2011   Dysphagia 07/01/2010   GERD 02/11/2009   Irritable bowel syndrome 02/11/2009   Generalized abdominal pain 02/11/2009   History of colonic polyps 02/11/2009    Past Medical History:  Diagnosis Date   Angina effort 05/22/2013   Anxiety    CAD (coronary artery disease), possible SCAD 12/14/2012   Colon polyps    Duodenitis    Dyspnea on exertion    LEXISCAN , 09/30/2008 - normal, EKG negative for ischemia, no ECG changes   Esophageal stricture    Gastritis    GERD (gastroesophageal reflux disease)    Gout    only from RX given after MI (01/22/2013)   H/O hiatal hernia    History of esophageal stricture    Hypertension    2D ECHO, 09/30/2008 - EF >55%, normal: RENAL DOPPLER, 03/21/2007 - normal duplex study   Hypothyroidism    IBS (irritable bowel syndrome)    Ischemic colitis    Lower GI bleeding    10/07/11 I've had 3 episodes in the past year   Myocardial infarction (HCC) 12/10/2012   spontaneous coronary artery dissection (01/22/2013)   Tubular adenoma polyp of rectum 2008    Past Surgical History:  Procedure Laterality Date   ABDOMINAL HYSTERECTOMY  1999   ANTERIOR LUMBAR FUSION  2010   L4-5 (01/22/2013)   CARDIAC CATHETERIZATION N/A 02/06/2015   Procedure: Left Heart Cath and Coronary Angiography;  Surgeon: Alm LELON Clay, MD;  Location[facility] INVASIVE CV LAB;  Service: Cardiovascular;  Laterality: N/A;   ESOPHAGEAL DILATION  08/2010   ESOPHAGEAL DILATION     once or twice (01/22/2013)   HAMMER TOE SURGERY Bilateral ~ 2002   LEFT HEART CATHETERIZATION WITH CORONARY ANGIOGRAM N/A 12/11/2012   Procedure: LEFT HEART CATHETERIZATION WITH CORONARY ANGIOGRAM;  Surgeon: Alm LELON Clay, MD;  Location: [facility] CATH LAB;  Service: Cardiovascular;   Laterality: N/A;   LEFT HEART CATHETERIZATION WITH CORONARY ANGIOGRAM N/A 12/15/2012   Procedure: LEFT HEART CATHETERIZATION WITH CORONARY ANGIOGRAM;  Surgeon: Alm LELON Clay, MD;  Location: [facility] CATH LAB;  Service: Cardiovascular;  Laterality: N/A;   PERIPHERAL VENOUS STUDY  10/03/2008   No evidence of DVT, superficial thrombosis, or Baker's cyst   TONSILLECTOMY AND ADENOIDECTOMY  1960's   TUBAL LIGATION  1985    Social History   Tobacco Use   Smoking status: Never   Smokeless tobacco: Never  Vaping Use   Vaping status: Never Used  Substance Use Topics   Alcohol use: No   Drug use: No    Family History  Problem Relation Age of Onset   Colon cancer Brother 27   Kidney disease Mother    Stroke Father    Multiple myeloma Sister    Heart attack Brother    Other Brother    Other Brother     No Known Allergies  Medication list has been reviewed and updated.  Current Outpatient Medications on File Prior to Visit  Medication Sig Dispense Refill   allopurinol  (ZYLOPRIM ) 100 MG tablet Take 3 tablets (300 mg total) by mouth daily. (Patient taking differently: Take 300 mg by mouth as needed.) 270 tablet 3   ALPRAZolam  (XANAX ) 0.5 MG tablet TAKE ONE TABLET BY MOUTH THREE TIMES DAILY AS NEEDED FOR ANXIETY 90 tablet 0   amLODipine  (NORVASC ) 5 MG tablet Take 1 tablet (5 mg total) by mouth daily. 90 tablet 3   aspirin  EC 81 MG tablet Take 81 mg by mouth daily. Swallow whole.     clopidogrel  (PLAVIX ) 75 MG tablet Take 1 tablet (75 mg total) by mouth daily. 90 tablet 3   dicyclomine  (BENTYL ) 10 MG capsule Take 2 capsules (20 mg total) by mouth 4 (four) times daily -  before meals and at bedtime. 240 capsule 0   Evolocumab  (REPATHA  SURECLICK) 140 MG/ML SOAJ Inject 140 mg as directed every 14 (fourteen) days. 2 mL 4   ezetimibe  (ZETIA ) 10 MG tablet TAKE ONE TABLET BY MOUTH ONCE A DAY 90 tablet 3   lisinopril  (ZESTRIL ) 5 MG tablet TAKE ONE TABLET BY MOUTH ONCE A DAY 90 tablet 1   metoprolol   tartrate (LOPRESSOR ) 25 MG tablet TAKE ONE TABLET BY MOUTH TWICE DAILY 180 tablet 3   nitroGLYCERIN  (NITROSTAT ) 0.4 MG SL tablet Place 1 tablet (0.4 mg total) under the tongue every 5 (five) minutes as needed for chest pain. 25 tablet 3   ondansetron  (ZOFRAN ) 4 MG tablet Take 1 tablet (4 mg total) by mouth every 8 (eight) hours as needed for nausea or vomiting. 30 tablet 1   pantoprazole  (PROTONIX ) 40 MG tablet TAKE ONE TABLET BY MOUTH ONCE A DAY 90 tablet 3   Plecanatide  (TRULANCE ) 3 MG TABS Take 1 tablet (3 mg total) by mouth daily. 30 tablet 3   predniSONE  (DELTASONE ) 20 MG tablet Take 2 tablets (40 mg total) by mouth daily with breakfast. Use for 3-5 days as needed for gout attack 30 tablet 0   ranolazine  (RANEXA ) 1000 MG SR tablet Take 1 tablet (1,000 mg total) by mouth 2 (two) times daily. 90 tablet 3   Semaglutide -Weight Management (WEGOVY ) 2.4 MG/0.75ML SOAJ Inject 2.4 mg into the skin once a week. 9 mL 2   fluticasone  (FLONASE ) 50 MCG/ACT nasal spray Place 2 sprays into both nostrils daily. (Patient not taking: Reported on 05/17/2024) 16 g 0   lidocaine  (XYLOCAINE ) 2 % solution Swallow 10mL every 6 hours as needed for sore throat (Patient not taking: Reported on 05/17/2024) 100 mL 0   promethazine -dextromethorphan (PROMETHAZINE -DM) 6.25-15 MG/5ML syrup Take 5 mLs by mouth 4 (four) times daily as needed. (Patient not taking: Reported on 05/17/2024) 118 mL 0   No current facility-administered medications on file prior to visit.    Review of Systems:  As per HPI- otherwise negative.   Physical Examination: Vitals:   05/17/24 1019  BP: 112/74  Pulse: 86  Temp: 97.8 F (36.6 C)  SpO2: 94%   Vitals:   05/17/24 1019  Weight: 187 lb 9.6 oz (85.1 kg)  Height: 5' 3 (1.6 m)   Body mass index is 33.23 kg/m. Ideal Body Weight: Weight in (lb) to have BMI = 25: 140.8  GEN: no acute distress. Mildly obese, looks well  HEENT: Atraumatic, Normocephalic.  Ears and Nose: No external  deformity. CV: RRR, No M/G/R. No JVD. No thrill. No extra heart sounds. PULM: CTA B, no wheezes, crackles, rhonchi. No retractions. No resp. distress. No accessory muscle use. ABD: S, NT, NDmildly obese,  looks well. No rebound. No HSM. EXTR: No c/c/e PSYCH: Normally interactive. Conversant.  No CVA tenderness  Belly is benign   Assessment and Plan: Dysuria - Plan: Urine Culture, POCT urinalysis dipstick, cephALEXin  (KEFLEX ) 500 MG capsule  Assessment & Plan Urinary tract infection Symptoms consistent with UTI. Previous UTI history noted. - Treat current UTI and monitor for recurrence. - Consider vaginal estrogen cream if UTIs become frequent.  Obesity On Wegovy  with weight loss and side effects. Discussed sustainable weight loss and dose adjustment for side effects. - Continue current dose of Wegovy  for three months and reassess. - Consider reducing Wegovy  dose to 1.7 mg if side effects persist.  Irritable bowel syndrome with constipation Diarrhea and explosive bowel movements linked to new medication. Uses medication sparingly due to side effects.  Hip osteoarthritis Pain in hips, especially when sitting. Tylenol  likely safe with heart condition. - Use Tylenol  for pain management, as it is likely safe with heart condition. - Consider recumbent bicycle at [employer] for exercise. - Encourage water aerobics if accessible.  General Health Maintenance Flu shot not received. Declines further mammograms. Questions necessity of GYN visit post-hysterectomy. - Administer flu shot. - Encourage continuation of mammograms every couple of years. - Offer to manage GYN-related health maintenance if she chooses not to see GYN.  Signed Harlene Schroeder, MD "

## 2024-05-15 NOTE — Patient Instructions (Incomplete)
 It was good to see you today I do continue to recommend an annual flu shot- given today!- also  a COVID booster this fall, and the shingles vaccine series at your pharmacy   We will start you on keflex while we await your urine culture  Plesae let me know if you are not feeling better in the next couple of day-Sooner if worse.

## 2024-05-17 ENCOUNTER — Ambulatory Visit (INDEPENDENT_AMBULATORY_CARE_PROVIDER_SITE_OTHER): Admitting: Family Medicine

## 2024-05-17 ENCOUNTER — Encounter: Payer: Self-pay | Admitting: Family Medicine

## 2024-05-17 VITALS — BP 112/74 | HR 86 | Temp 97.8°F | Ht 63.0 in | Wt 187.6 lb

## 2024-05-17 DIAGNOSIS — M25552 Pain in left hip: Secondary | ICD-10-CM

## 2024-05-17 DIAGNOSIS — M25551 Pain in right hip: Secondary | ICD-10-CM

## 2024-05-17 DIAGNOSIS — E669 Obesity, unspecified: Secondary | ICD-10-CM | POA: Diagnosis not present

## 2024-05-17 DIAGNOSIS — Z23 Encounter for immunization: Secondary | ICD-10-CM

## 2024-05-17 DIAGNOSIS — R300 Dysuria: Secondary | ICD-10-CM | POA: Diagnosis not present

## 2024-05-17 DIAGNOSIS — G8929 Other chronic pain: Secondary | ICD-10-CM

## 2024-05-17 LAB — POCT URINALYSIS DIP (MANUAL ENTRY)
Glucose, UA: NEGATIVE mg/dL
Ketones, POC UA: NEGATIVE mg/dL
Nitrite, UA: NEGATIVE
Protein Ur, POC: 30 mg/dL — AB
Spec Grav, UA: 1.02 (ref 1.010–1.025)
Urobilinogen, UA: 0.2 U/dL
pH, UA: 6 (ref 5.0–8.0)

## 2024-05-17 MED ORDER — CEPHALEXIN 500 MG PO CAPS: 500.0000 mg | ORAL_CAPSULE | Freq: Two times a day (BID) | ORAL | 0 refills | Status: DC

## 2024-05-17 NOTE — Addendum Note (Signed)
 Addended by: ORVIN HARLENE HERO on: 05/17/2024 01:18 PM   Modules accepted: Orders

## 2024-05-20 ENCOUNTER — Ambulatory Visit: Payer: Self-pay | Admitting: Family Medicine

## 2024-05-20 LAB — URINE CULTURE
MICRO NUMBER:: 17108373
SPECIMEN QUALITY:: ADEQUATE

## 2024-05-22 ENCOUNTER — Other Ambulatory Visit: Payer: Self-pay | Admitting: Family Medicine

## 2024-05-22 DIAGNOSIS — Z6839 Body mass index (BMI) 39.0-39.9, adult: Secondary | ICD-10-CM

## 2024-05-28 ENCOUNTER — Other Ambulatory Visit

## 2024-05-28 ENCOUNTER — Encounter: Payer: Self-pay | Admitting: Family Medicine

## 2024-05-28 DIAGNOSIS — R300 Dysuria: Secondary | ICD-10-CM

## 2024-05-29 ENCOUNTER — Telehealth: Payer: Self-pay | Admitting: Gastroenterology

## 2024-05-29 LAB — URINE CULTURE
MICRO NUMBER:: 17151061
SPECIMEN QUALITY:: ADEQUATE

## 2024-05-29 MED ORDER — PANTOPRAZOLE SODIUM 40 MG PO TBEC: 40.0000 mg | DELAYED_RELEASE_TABLET | Freq: Two times a day (BID) | ORAL | 0 refills | Status: DC

## 2024-05-29 NOTE — Telephone Encounter (Signed)
 [name] pt states that she has been having issues with epigastric pain for the past 4 days. She reports she is takig her protonix  every am and has tried some pepto with no relief. Pt wants to know what she can do to get the pain and bloating under control. Please advise.

## 2024-05-29 NOTE — Telephone Encounter (Signed)
 Left detailed message on pts voicemail. New script sent to pharmacy and she is aware of recommendations per [facility] PA.

## 2024-05-29 NOTE — Telephone Encounter (Signed)
 Patient states her ulcer has been giving her pain . Requesting f/u call to discuss. Please advise.

## 2024-05-30 ENCOUNTER — Encounter: Payer: Self-pay | Admitting: Family Medicine

## 2024-06-05 ENCOUNTER — Other Ambulatory Visit: Payer: Self-pay | Admitting: Family Medicine

## 2024-06-05 DIAGNOSIS — F411 Generalized anxiety disorder: Secondary | ICD-10-CM

## 2024-06-12 ENCOUNTER — Telehealth: Payer: Self-pay | Admitting: Family Medicine

## 2024-06-12 NOTE — Telephone Encounter (Signed)
 Copied from CRM [id number]. Topic: Medicare AWV >> Jun 12, 2024 10:06 AM Nathanel DEL wrote: LVM 06/12/24 AWV appt changed from telephone/video to office due to Traditional Medicare AB. All Medicare A/B patient are to do office visits only. Please call to confirm   Nathanel Paschal; Care Guide Ambulatory Clinical Support [facility] Direct Dial: [phone]

## 2024-06-21 ENCOUNTER — Other Ambulatory Visit: Payer: Self-pay | Admitting: Internal Medicine

## 2024-07-11 ENCOUNTER — Ambulatory Visit: Admitting: Cardiovascular Disease

## 2024-07-13 ENCOUNTER — Ambulatory Visit (INDEPENDENT_AMBULATORY_CARE_PROVIDER_SITE_OTHER): Payer: Medicare Other | Admitting: *Deleted

## 2024-07-13 VITALS — BP 101/72 | HR 70 | Temp 97.8°F | Resp 16 | Ht 63.0 in

## 2024-07-13 DIAGNOSIS — Z0000 Encounter for general adult medical examination without abnormal findings: Secondary | ICD-10-CM | POA: Diagnosis not present

## 2024-07-13 NOTE — Patient Instructions (Addendum)
 Ms. Pieri,  Thank you for taking the time for your Medicare Wellness Visit. I appreciate your continued commitment to your health goals. Please review the care plan we discussed, and feel free to reach out if I can assist you further.  Please note that Annual Wellness Visits do not include a physical exam. Some assessments may be limited, especially if the visit was conducted virtually. If needed, we may recommend an in-person follow-up with your provider.  GOAL: to return to walking an hour a day   Ongoing Care Seeing your primary care provider every 3 to 6 months helps us  monitor your health and provide consistent, personalized care.   Dr Watt:  11/19/24 10:20am Medicare AWV: 07/19/25 10:20am in person  Referrals If a referral was made during today's visit and you haven't received any updates within two weeks, please contact the referred provider directly to check on the status.  Recommended Screenings: You will need to get the following vaccines at your local pharmacy: Covid and shingles if you change your mind.  Health Maintenance  Topic Date Due   Breast Cancer Screening  05/10/2024   COVID-19 Vaccine (4 - 2025-26 season) 07/13/2025*   Zoster (Shingles) Vaccine (1 of 2) 07/13/2025*   DTaP/Tdap/Td vaccine (2 - Td or Tdap) 06/25/2025   Medicare Annual Wellness Visit  07/13/2025   Colon Cancer Screening  06/05/2030   Pneumococcal Vaccine for age over 41  Completed   Osteoporosis screening with Bone Density Scan  Completed   Hepatitis C Screening  Completed   Meningitis B Vaccine  Aged Out   Flu Shot  Discontinued  *Topic was postponed. The date shown is not the original due date.       07/13/2024    9:02 AM  Advanced Directives  Does Patient Have a Medical Advance Directive? Yes  Type of Advance Directive Out of facility DNR (pink MOST or yellow form)  Does patient want to make changes to medical advance directive? Yes (MAU/Ambulatory/Procedural Areas - Information  given)  Would patient like information on creating a medical advance directive? Yes (MAU/Ambulatory/Procedural Areas - Information given)  Once completed and notarized, you may return a copy of your Advanced Directive(s) by either of the following:  Bring a copy of your health care power of attorney and living will to the office to be added to your chart at your convenience. You can mail a copy to [facility] [address] [place], [place] [address] or email to [email or web address]   Vision: Annual vision screenings are recommended for early detection of glaucoma, cataracts, and diabetic retinopathy. These exams can also reveal signs of chronic conditions such as diabetes and high blood pressure.  Dental: Annual dental screenings help detect early signs of oral cancer, gum disease, and other conditions linked to overall health, including heart disease and diabetes.  Please see the attached documents for additional preventive care recommendations.

## 2024-07-13 NOTE — Progress Notes (Signed)
 Please attest this visit in the absence of patient primary care provider.   Chief Complaint  Patient presents with   Medicare Wellness     Subjective:   [name] is a 68 y.o. female who presents for a Medicare Annual Wellness Visit.  Visit info / Clinical Intake: Medicare Wellness Visit Type:: Subsequent Annual Wellness Visit Persons participating in visit and providing information:: patient Medicare Wellness Visit Mode:: In-person (required for WTM) Interpreter Needed?: No Pre-visit prep was completed: yes AWV questionnaire completed by patient prior to visit?: no Living arrangements:: (!) lives alone Patient's Overall Health Status Rating: very good Typical amount of pain: some Does pain affect daily life?: no Are you currently prescribed opioids?: no  Dietary Habits and Nutritional Risks How many meals a day?: 2 Eats fruit and vegetables daily?: yes Most meals are obtained by: preparing own meals; eating out In the last 2 weeks, have you had any of the following?: (!) nausea, vomiting, diarrhea (due to Wegovy ) Diabetic:: no  Functional Status Activities of Daily Living (to include ambulation/medication): Independent Ambulation: Independent Medication Administration: Independent Home Management (perform basic housework or laundry): Independent Manage your own finances?: yes Primary transportation is: driving Concerns about vision?: no *vision screening is required for WTM* (up to date with [facility]) Concerns about hearing?: no  Fall Screening Falls in the past year?: 0 Number of falls in past year: 0 Was there an injury with Fall?: 0 Fall Risk Category Calculator: 0 Patient Fall Risk Level: Low Fall Risk  Fall Risk Patient at Risk for Falls Due to: Orthopedic patient Fall risk Follow up: Falls evaluation completed  Home and Transportation Safety: All rugs have non-skid backing?: yes All stairs or steps have railings?: N/A, no stairs Grab  bars in the bathtub or shower?: (!) no Have non-skid surface in bathtub or shower?: yes Good home lighting?: yes Regular seat belt use?: yes Hospital stays in the last year:: no  Cognitive Assessment Difficulty concentrating, remembering, or making decisions? : no Will 6CIT or Mini Cog be Completed: yes What year is it?: 0 points What month is it?: 0 points Give patient an address phrase to remember (5 components): [address], [place] About what time is it?: 0 points Count backwards from 20 to 1: 0 points Say the months of the year in reverse: 0 points Repeat the address phrase from earlier: 2 points 6 CIT Score: 2 points  Advance Directives (For Healthcare) Does Patient Have a Medical Advance Directive?: Yes Does patient want to make changes to medical advance directive?: Yes (MAU/Ambulatory/Procedural Areas - Information given) Type of Advance Directive: Out of facility DNR (pink MOST or yellow form) Out of facility DNR (pink MOST or yellow form) in Chart? (Ambulatory ONLY): No - copy requested Would patient like information on creating a medical advance directive?: Yes (MAU/Ambulatory/Procedural Areas - Information given)  Reviewed/Updated  Reviewed/Updated: Reviewed All (Medical, Surgical, Family, Medications, Allergies, Care Teams, Patient Goals)    Allergies (verified) Patient has no known allergies.   Current Medications (verified) Outpatient Encounter Medications as of 07/13/2024  Medication Sig   allopurinol  (ZYLOPRIM ) 100 MG tablet Take 3 tablets (300 mg total) by mouth daily. (Patient taking differently: Take 300 mg by mouth as needed.)   ALPRAZolam  (XANAX ) 0.5 MG tablet TAKE ONE TABLET BY MOUTH THREE TIMES DAILY AS NEEDED FOR ANXIETY   amLODipine  (NORVASC ) 5 MG tablet Take 1 tablet (5 mg total) by mouth daily.   aspirin  EC 81 MG tablet Take  81 mg by mouth daily. Swallow whole.   clopidogrel  (PLAVIX ) 75 MG tablet Take 1 tablet (75 mg total) by mouth daily.    dicyclomine  (BENTYL ) 10 MG capsule Take 2 capsules (20 mg total) by mouth 4 (four) times daily -  before meals and at bedtime.   Evolocumab  (REPATHA  SURECLICK) 140 MG/ML SOAJ INJECT 140 MG INTO THE SKIN EVERY 14 DAYS   ezetimibe  (ZETIA ) 10 MG tablet TAKE ONE TABLET BY MOUTH ONCE A DAY   fluticasone  (FLONASE ) 50 MCG/ACT nasal spray Place 2 sprays into both nostrils daily.   lisinopril  (ZESTRIL ) 5 MG tablet TAKE ONE TABLET BY MOUTH ONCE A DAY   metoprolol  tartrate (LOPRESSOR ) 25 MG tablet TAKE ONE TABLET BY MOUTH TWICE DAILY   nitroGLYCERIN  (NITROSTAT ) 0.4 MG SL tablet Place 1 tablet (0.4 mg total) under the tongue every 5 (five) minutes as needed for chest pain.   ondansetron  (ZOFRAN ) 4 MG tablet TAKE ONE TABLET BY MOUTH EVERY EIGHT HOURS AS NEEDED NAUSEA AND VOMITING   pantoprazole  (PROTONIX ) 40 MG tablet TAKE ONE TABLET BY MOUTH ONCE A DAY   pantoprazole  (PROTONIX ) 40 MG tablet Take 1 tablet (40 mg total) by mouth 2 (two) times daily before a meal.   Plecanatide  (TRULANCE ) 3 MG TABS Take 1 tablet (3 mg total) by mouth daily.   predniSONE  (DELTASONE ) 20 MG tablet Take 2 tablets (40 mg total) by mouth daily with breakfast. Use for 3-5 days as needed for gout attack   ranolazine  (RANEXA ) 1000 MG SR tablet Take 1 tablet (1,000 mg total) by mouth 2 (two) times daily.   Semaglutide -Weight Management (WEGOVY ) 2.4 MG/0.75ML SOAJ Inject 2.4 mg into the skin once a week.   [DISCONTINUED] cephALEXin  (KEFLEX ) 500 MG capsule Take 1 capsule (500 mg total) by mouth 2 (two) times daily.   [DISCONTINUED] lidocaine  (XYLOCAINE ) 2 % solution Swallow 10mL every 6 hours as needed for sore throat (Patient not taking: Reported on 05/17/2024)   [DISCONTINUED] promethazine -dextromethorphan (PROMETHAZINE -DM) 6.25-15 MG/5ML syrup Take 5 mLs by mouth 4 (four) times daily as needed. (Patient not taking: Reported on 05/17/2024)   No facility-administered encounter medications on file as of 07/13/2024.    History: Past  Medical History:  Diagnosis Date   Angina effort 05/22/2013   Anxiety    CAD (coronary artery disease), possible SCAD 12/14/2012   Colon polyps    Duodenitis    Dyspnea on exertion    LEXISCAN , 09/30/2008 - normal, EKG negative for ischemia, no ECG changes   Esophageal stricture    Gastritis    GERD (gastroesophageal reflux disease)    Gout    only from RX given after MI (01/22/2013)   H/O hiatal hernia    History of esophageal stricture    Hypertension    2D ECHO, 09/30/2008 - EF >55%, normal: RENAL DOPPLER, 03/21/2007 - normal duplex study   Hypothyroidism    IBS (irritable bowel syndrome)    Ischemic colitis    Lower GI bleeding    10/07/11 I've had 3 episodes in the past year   Myocardial infarction (HCC) 12/10/2012   spontaneous coronary artery dissection (01/22/2013)   Tubular adenoma polyp of rectum 2008   Past Surgical History:  Procedure Laterality Date   ABDOMINAL HYSTERECTOMY  1999   ANTERIOR LUMBAR FUSION  2010   L4-5 (01/22/2013)   CARDIAC CATHETERIZATION N/A 02/06/2015   Procedure: Left Heart Cath and Coronary Angiography;  Surgeon: Alm LELON Clay, MD;  [facility] INVASIVE CV LAB;  Service: Cardiovascular;  Laterality: N/A;   ESOPHAGEAL DILATION  08/2010   ESOPHAGEAL DILATION     once or twice (01/22/2013)   HAMMER TOE SURGERY Bilateral ~ 2002   LEFT HEART CATHETERIZATION WITH CORONARY ANGIOGRAM N/A 12/11/2012   Procedure: LEFT HEART CATHETERIZATION WITH CORONARY ANGIOGRAM;  Surgeon: Alm LELON Clay, MD;  Location: [facility] CATH LAB;  Service: Cardiovascular;  Laterality: N/A;   LEFT HEART CATHETERIZATION WITH CORONARY ANGIOGRAM N/A 12/15/2012   Procedure: LEFT HEART CATHETERIZATION WITH CORONARY ANGIOGRAM;  Surgeon: Alm LELON Clay, MD;  Location: [facility] CATH LAB;  Service: Cardiovascular;  Laterality: N/A;   PERIPHERAL VENOUS STUDY  10/03/2008   No evidence of DVT, superficial thrombosis, or Baker's cyst   TONSILLECTOMY AND ADENOIDECTOMY  1960's   TUBAL LIGATION   1985   Family History  Problem Relation Age of Onset   Colon cancer Brother 58   Kidney disease Mother    Stroke Father    Multiple myeloma Sister    Heart attack Brother    Other Brother    Other Brother    Social History   Occupational History   Occupation: Building control surveyor    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  Tobacco Use   Smoking status: Never   Smokeless tobacco: Never  Vaping Use   Vaping status: Never Used  Substance and Sexual Activity   Alcohol use: No   Drug use: No   Sexual activity: Not Currently   Tobacco Counseling Counseling given: Not Answered  SDOH Screenings   Food Insecurity: No Food Insecurity (07/13/2024)  Housing: Low Risk (07/13/2024)  Transportation Needs: No Transportation Needs (07/13/2024)  Utilities: Not At Risk (07/13/2024)  Alcohol Screen: Low Risk (07/13/2023)  Depression (PHQ2-9): Low Risk (07/13/2024)  Financial Resource Strain: Low Risk (07/13/2023)  Physical Activity: Sufficiently Active (07/13/2024)  Social Connections: Moderately Integrated (07/13/2024)  Stress: Stress Concern Present (07/13/2024)  Tobacco Use: Low Risk (07/13/2024)  Health Literacy: Adequate Health Literacy (07/13/2023)   See flowsheets for full screening details  Depression Screen PHQ 2 & 9 Depression Scale- Over the past 2 weeks, how often have you been bothered by any of the following problems? Little interest or pleasure in doing things: 0 Feeling down, depressed, or hopeless (PHQ Adolescent also includes...irritable): 0 PHQ-2 Total Score: 0 Trouble falling or staying asleep, or sleeping too much: 0 Feeling tired or having little energy: 0 Poor appetite or overeating (PHQ Adolescent also includes...weight loss): 0 Feeling bad about yourself - or that you are a failure or have let yourself or your family down: 0 Trouble concentrating on things, such as reading the newspaper or watching television (PHQ Adolescent also includes...like school work): 0 Moving or  speaking so slowly that other people could have noticed. Or the opposite - being so fidgety or restless that you have been moving around a lot more than usual: 0 Thoughts that you would be better off dead, or of hurting yourself in some way: 0 PHQ-9 Total Score: 0 If you checked off any problems, how difficult have these problems made it for you to do your work, take care of things at home, or get along with other people?: Not difficult at all     Goals Addressed             This Visit's Progress    to return to walking an hour a day               Objective:    Today's Vitals   07/13/24 0856  BP: 101/72  Pulse: 70  Resp: 16  Temp: 97.8 F (36.6 C)  TempSrc: Oral  SpO2: 98%  Height: 5' 3 (1.6 m)   Body mass index is 33.23 kg/m.  Hearing/Vision screen No results found. Immunizations and Health Maintenance Health Maintenance  Topic Date Due   Mammogram  05/10/2024   COVID-19 Vaccine (4 - 2025-26 season) 07/13/2025 (Originally 04/02/2024)   Zoster Vaccines- Shingrix (1 of 2) 07/13/2025 (Originally 03/25/2006)   DTaP/Tdap/Td (2 - Td or Tdap) 06/25/2025   Medicare Annual Wellness (AWV)  07/13/2025   Colonoscopy  06/05/2030   Pneumococcal Vaccine: 50+ Years  Completed   Bone Density Scan  Completed   Hepatitis C Screening  Completed   Meningococcal B Vaccine  Aged Out   Influenza Vaccine  Discontinued        Assessment/Plan:  This is a routine wellness examination for [name].  Patient Care Team: Copland, Harlene BROCKS, MD as PCP - General (Family Medicine) Court Dorn PARAS, MD as PCP - Cardiology (Cardiology) Rosalynn LELON Ingle, MD (Inactive) as Consulting Physician (Obstetrics and Gynecology) Abran Norleen SAILOR, MD as Consulting Physician (Gastroenterology) Mona Vinie BROCKS, MD as Consulting Physician (Cardiology)  I have personally reviewed and noted the following in the patients chart:   Medical and social history Use of alcohol, tobacco or illicit drugs  Current  medications and supplements including opioid prescriptions. Functional ability and status Nutritional status Physical activity Advanced directives List of other physicians Hospitalizations, surgeries, and ER visits in previous 12 months Vitals Screenings to include cognitive, depression, and falls Referrals and appointments  No orders of the defined types were placed in this encounter.  In addition, I have reviewed and discussed with patient certain preventive protocols, quality metrics, and best practice recommendations. A written personalized care plan for preventive services as well as general preventive health recommendations were provided to patient.   Lolita Libra, CMA   07/13/2024   Return in 1 year (on 07/13/2025).  After Visit Summary: (MyChart) Due to this being a telephonic visit, the after visit summary with patients personalized plan was offered to patient via MyChart   Nurse Notes: nothing significant to report

## 2024-07-19 ENCOUNTER — Other Ambulatory Visit: Payer: Self-pay | Admitting: Cardiovascular Disease

## 2024-07-23 ENCOUNTER — Ambulatory Visit: Admitting: Family Medicine

## 2024-08-08 ENCOUNTER — Ambulatory Visit: Attending: Cardiovascular Disease | Admitting: Cardiovascular Disease

## 2024-08-08 ENCOUNTER — Encounter: Payer: Self-pay | Admitting: Cardiovascular Disease

## 2024-08-08 VITALS — BP 110/76 | HR 67 | Ht 62.0 in | Wt 182.6 lb

## 2024-08-08 DIAGNOSIS — E782 Mixed hyperlipidemia: Secondary | ICD-10-CM | POA: Insufficient documentation

## 2024-08-08 DIAGNOSIS — I2510 Atherosclerotic heart disease of native coronary artery without angina pectoris: Secondary | ICD-10-CM | POA: Diagnosis not present

## 2024-08-08 DIAGNOSIS — I10 Essential (primary) hypertension: Secondary | ICD-10-CM | POA: Diagnosis present

## 2024-08-08 NOTE — Patient Instructions (Addendum)
 Medication Instructions:  Your physician recommends that you continue on your current medications as directed. Please refer to the Current Medication list given to you today.  *If you need a refill on your cardiac medications before your next appointment, please call your pharmacy*   Follow-Up: At [facility], you and your health needs are our priority.  As part of our continuing mission to provide you with exceptional heart care, our providers are all part of one team.  This team includes your primary Cardiologist (physician) and Advanced Practice Providers or APPs (Physician Assistants and Nurse Practitioners) who all work together to provide you with the care you need, when you need it.  Your next appointment:   6 month(s)  Provider:   Hao Meng, [name]         Then, Lauro Portal, MD will plan to see you again in 12 month(s).     We recommend signing up for the patient portal called MyChart.  Sign up information is provided on this After Visit Summary.  MyChart is used to connect with patients for Virtual Visits (Telemedicine).  Patients are able to view lab/test results, encounter notes, upcoming appointments, etc.  Non-urgent messages can be sent to your provider as well.   To learn more about what you can do with MyChart, go to [email or web address].

## 2024-08-08 NOTE — Progress Notes (Signed)
 "     08/08/2024 LACEE GREY   1956-01-28  [id number]  Primary Physician Copland, Harlene BROCKS, MD Primary Cardiologist: Dorn JINNY Lesches MD [facility], [place], [place]  HPI:  [name] is a 69 y.o.   who I last saw December of last year with a history of HTN, obesity, and hypothyroidism . I last saw her in the office 07/13/2023.  She presented on 12/11/12 after a prolonged episode of angina. Her EKG was without acute changes, but she ruled in for NSTEMI. Her troponin peaked at 4.95. She was placed on IV Heparin  and underwent diagnostic coronary angiography. Her initial cardiac catheterization on 12/10/2012, performed by Dr. Anner, demonstrated diffuse LAD disease with essentially subtotal occlusion that had the appearance of possible Spontaneous Coronary Artery Dissection. However, with existing CAD, simply diffuse disease was also possible. It was decided to not do any intervention at that time, but to wait and have the patient return, several days later, for a re-look cath to re-evaluate the LAD. She was maintained on medical therapy and had improvement in symtpoms. She returned to the cath lab on 5/16 for re-look. This was also performed by Dr. Anner. She was noted to have progression of LAD disease to total occlusion at the original ~95% subtotal location with improved D1-distal LAD and R-L collaterals (septal and distal RPDA). The images were reviewd by Dr. Lesches and Dr. Morris and it was concluded that the best course of action was not to proceed with extensive LAD PTCA-PCI, in the absence of on-going symptoms. Medical therapy was recommended. Since I saw her a year ago she's been doing well until this past July when she was admitted with unstable angina. She ruled out for myocardial infarction. She underwent cardiac catheterization by Dr. Anner revealing an occluded LAD in the midportion and otherwise minimal CAD. She did have a 90% small ostial stent second marginal branch stenosis  and 60% PLA stenosis with what appeared to be fairly well preserved LV function. She had an apical wall motion abnormality. Since I saw her a year ago she saw remained currently stable specifically denying chest pain or shortness of breath. Her major complaint is of chronic fatigue and lack of energy.She retired from working as a [employer] on 08/01/17 and seems much happier.   She has developed significant dyspnea over the last several months on exertion but really denies chest pain.  She is on Imdur  and ranolazine .  She is a chronically occluded LAD, moderate diagonal branch, obtuse marginal branch and distal dominant RCA disease.  She has right to left collaterals.  I obtained a 2D echocardiogram on her 10/11/2019 reviewed which revealed an EF of 50 to 55% with focal wall motion abnormalities in the LAD territory, mild to moderate MR.  Since changing her walking patterns to no longer walking up inclines her shortness of breath is somewhat improved.   Since I saw her a year ago she remained stable.  She did go to [place] [employer] with her grandchildren, walked more than 12,000 steps a day and was completely asymptomatic this past summer.  She had a negative Myoview  01/20/2023.  She is on Zetia  and Repatha , followed by Dr. Mona with an excellent lipid profile.   Active Medications[1]   Allergies[2]  Social History   Socioeconomic History   Marital status: Single    Spouse name: Not on file   Number of children: 2   Years of education: Not on file   Highest education level: Not  on file  Occupational History   Occupation: Drug enforcement    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  Tobacco Use   Smoking status: Never   Smokeless tobacco: Never  Vaping Use   Vaping status: Never Used  Substance and Sexual Activity   Alcohol use: No   Drug use: No   Sexual activity: Not Currently  Other Topics Concern   Not on file  Social History Narrative   Daily caffeine use.   Social Drivers of Health   Tobacco Use: Low  Risk (08/08/2024)   Patient History    Smoking Tobacco Use: Never    Smokeless Tobacco Use: Never    Passive Exposure: Not on file  Financial Resource Strain: Low Risk (07/13/2023)   Overall Financial Resource Strain (CARDIA)    Difficulty of Paying Living Expenses: Not hard at all  Food Insecurity: No Food Insecurity (07/13/2024)   Epic    Worried About Radiation Protection Practitioner of Food in the Last Year: Never true    Ran Out of Food in the Last Year: Never true  Transportation Needs: No Transportation Needs (07/13/2024)   Epic    Lack of Transportation (Medical): No    Lack of Transportation (Non-Medical): No  Physical Activity: Sufficiently Active (07/13/2024)   Exercise Vital Sign    Days of Exercise per Week: 5 days    Minutes of Exercise per Session: 30 min  Stress: Stress Concern Present (07/13/2024)   [employer] of Occupational Health - Occupational Stress Questionnaire    Feeling of Stress: To some extent  Social Connections: Moderately Integrated (07/13/2024)   Social Connection and Isolation Panel    Frequency of Communication with Friends and Family: More than three times a week    Frequency of Social Gatherings with Friends and Family: More than three times a week    Attends Religious Services: More than 4 times per year    Active Member of Clubs or Organizations: Yes    Attends Banker Meetings: More than 4 times per year    Marital Status: Divorced  Intimate Partner Violence: Not At Risk (07/13/2024)   Epic    Fear of Current or Ex-Partner: No    Emotionally Abused: No    Physically Abused: No    Sexually Abused: No  Depression (PHQ2-9): Low Risk (07/13/2024)   Depression (PHQ2-9)    PHQ-2 Score: 0  Alcohol Screen: Low Risk (07/13/2023)   Alcohol Screen    Last Alcohol Screening Score (AUDIT): 1  Housing: Low Risk (07/13/2024)   Epic    Unable to Pay for Housing in the Last Year: No    Number of Times Moved in the Last Year: 0    Homeless in the Last  Year: No  Utilities: Not At Risk (07/13/2024)   Epic    Threatened with loss of utilities: No  Health Literacy: Adequate Health Literacy (07/13/2023)   B1300 Health Literacy    Frequency of need for help with medical instructions: Never     Review of Systems: General: negative for chills, fever, night sweats or weight changes.  Cardiovascular: negative for chest pain, dyspnea on exertion, edema, orthopnea, palpitations, paroxysmal nocturnal dyspnea or shortness of breath Dermatological: negative for rash Respiratory: negative for cough or wheezing Urologic: negative for hematuria Abdominal: negative for nausea, vomiting, diarrhea, bright red blood per rectum, melena, or hematemesis Neurologic: negative for visual changes, syncope, or dizziness All other systems reviewed and are otherwise negative except as noted above.    Blood pressure 110/76,  pulse 67, height 5' 2 (1.575 m), weight 182 lb 9.6 oz (82.8 kg), SpO2 94%.  General appearance: alert and no distress Neck: no adenopathy, no carotid bruit, no JVD, supple, symmetrical, trachea midline, and thyroid  not enlarged, symmetric, no tenderness/mass/nodules Lungs: clear to auscultation bilaterally Heart: regular rate and rhythm, S1, S2 normal, no murmur, click, rub or gallop Extremities: extremities normal, atraumatic, no cyanosis or edema Pulses: 2+ and symmetric Skin: Skin color, texture, turgor normal. No rashes or lesions Neurologic: Grossly normal  EKG EKG Interpretation Date/Time:  Wednesday August 08 2024 10:59:42 EST Ventricular Rate:  67 PR Interval:  182 QRS Duration:  94 QT Interval:  390 QTC Calculation: 412 R Axis:   -35  Text Interpretation: Normal sinus rhythm Left axis deviation Moderate voltage criteria for LVH, may be normal variant ( R in aVL , Cornell product ) Nonspecific T wave abnormality When compared with ECG of 12-Jan-2024 13:15, No significant change was found Confirmed by Court Carrier [id number])  on 08/08/2024 11:12:11 AM    ASSESSMENT AND PLAN:   Essential hypertension History of essential hypertension blood pressure measured today at 110/76.  She is on amlodipine , lisinopril  and metoprolol .  Hyperlipidemia History of hyperlipidemia on Repatha  and Zetia  with lipid profile performed 02/15/2024 revealing total cholesterol 154, LDL of 69 HDL 58, at goal for secondary prevention.  CAD (coronary artery disease), possible SCAD History of CAD status post multiple catheterizations beginning 12/10/2012 where she had a subtotally occluded LAD.  Her last catheterization performed by Dr. Anner 02/06/2015 revealed an occluded LAD in the midportion with ostial obtuse marginal branch disease in the 90% range.  She had a Myoview  performed 01/20/2023 which was low risk and nonischemic.  She denies chest pain.     Carrier DOROTHA Court MD FACP,FACC,FAHA, FSCAI 08/08/2024 11:19 AM    [1]  Current Meds  Medication Sig   allopurinol  (ZYLOPRIM ) 100 MG tablet Take 3 tablets (300 mg total) by mouth daily. (Patient taking differently: Take 300 mg by mouth as needed.)   ALPRAZolam  (XANAX ) 0.5 MG tablet TAKE ONE TABLET BY MOUTH THREE TIMES DAILY AS NEEDED FOR ANXIETY   amLODipine  (NORVASC ) 5 MG tablet Take 1 tablet (5 mg total) by mouth daily.   aspirin  EC 81 MG tablet Take 81 mg by mouth daily. Swallow whole.   clobetasol (TEMOVATE) 0.05 % external solution Apply 1 Application topically daily as needed.   clopidogrel  (PLAVIX ) 75 MG tablet Take 1 tablet (75 mg total) by mouth daily.   dicyclomine  (BENTYL ) 10 MG capsule Take 2 capsules (20 mg total) by mouth 4 (four) times daily -  before meals and at bedtime.   Evolocumab  (REPATHA  SURECLICK) 140 MG/ML SOAJ INJECT 140 MG INTO THE SKIN EVERY 14 DAYS   ezetimibe  (ZETIA ) 10 MG tablet TAKE ONE TABLET BY MOUTH ONCE A DAY   lisinopril  (ZESTRIL ) 5 MG tablet TAKE ONE TABLET BY MOUTH ONCE A DAY   metoprolol  tartrate (LOPRESSOR ) 25 MG tablet TAKE ONE TABLET BY MOUTH TWICE  DAILY   nitroGLYCERIN  (NITROSTAT ) 0.4 MG SL tablet Place 1 tablet (0.4 mg total) under the tongue every 5 (five) minutes as needed for chest pain.   ondansetron  (ZOFRAN ) 4 MG tablet TAKE ONE TABLET BY MOUTH EVERY EIGHT HOURS AS NEEDED NAUSEA AND VOMITING   pantoprazole  (PROTONIX ) 40 MG tablet TAKE ONE TABLET BY MOUTH ONCE A DAY   predniSONE  (DELTASONE ) 20 MG tablet Take 2 tablets (40 mg total) by mouth daily with breakfast. Use for 3-5 days as  needed for gout attack   ranolazine  (RANEXA ) 1000 MG SR tablet Take 1 tablet (1,000 mg total) by mouth 2 (two) times daily.   Semaglutide -Weight Management (WEGOVY ) 2.4 MG/0.75ML SOAJ Inject 2.4 mg into the skin once a week.  [2] No Known Allergies  "

## 2024-08-08 NOTE — Assessment & Plan Note (Signed)
 History of essential hypertension blood pressure measured today at 110/76.  She is on amlodipine , lisinopril  and metoprolol .

## 2024-08-08 NOTE — Assessment & Plan Note (Signed)
 History of CAD status post multiple catheterizations beginning 12/10/2012 where she had a subtotally occluded LAD.  Her last catheterization performed by Dr. Anner 02/06/2015 revealed an occluded LAD in the midportion with ostial obtuse marginal branch disease in the 90% range.  She had a Myoview  performed 01/20/2023 which was low risk and nonischemic.  She denies chest pain.

## 2024-08-08 NOTE — Assessment & Plan Note (Signed)
 History of hyperlipidemia on Repatha  and Zetia  with lipid profile performed 02/15/2024 revealing total cholesterol 154, LDL of 69 HDL 58, at goal for secondary prevention.

## 2024-08-17 ENCOUNTER — Other Ambulatory Visit: Payer: Self-pay | Admitting: Family Medicine

## 2024-08-17 DIAGNOSIS — E66812 Obesity, class 2: Secondary | ICD-10-CM

## 2024-08-20 ENCOUNTER — Telehealth (HOSPITAL_BASED_OUTPATIENT_CLINIC_OR_DEPARTMENT_OTHER): Payer: Self-pay | Admitting: Cardiovascular Disease

## 2024-08-20 ENCOUNTER — Other Ambulatory Visit: Payer: Self-pay | Admitting: Family Medicine

## 2024-08-20 DIAGNOSIS — F411 Generalized anxiety disorder: Secondary | ICD-10-CM

## 2024-08-22 NOTE — Telephone Encounter (Signed)
" °*  STAT* If patient is at the pharmacy, call can be transferred to refill team.   1. Which medications need to be refilled? (please list name of each medication and dose if known)   ezetimibe  (ZETIA ) 10 MG tablet   2. Would you like to learn more about the convenience, safety, & potential cost savings by using the [facility]?   3. Are you open to using the [facility] (Type [facility]. ).  4. Which pharmacy/location (including street and city if local pharmacy) is medication to be sent to?  [id number] - [place], [place] - [address]   5. Do they need a 30 day or 90 day supply?  90 day  Patient stated she has 5 tablets left.   "

## 2024-08-22 NOTE — Telephone Encounter (Signed)
 Pt's medication was sent to pt's pharmacy as requested. Confirmation received.

## 2024-09-28 ENCOUNTER — Ambulatory Visit: Admitting: Internal Medicine

## 2024-11-19 ENCOUNTER — Ambulatory Visit: Admitting: Family Medicine

## 2024-12-05 ENCOUNTER — Ambulatory Visit: Admitting: Family Medicine

## 2025-07-19 ENCOUNTER — Ambulatory Visit
